# Patient Record
Sex: Male | Born: 1940 | Race: White | Hispanic: No | Marital: Married | Smoking: Former smoker
Health system: Southern US, Community
[De-identification: ages and names within clinical notes are randomized; demographics above are authoritative.]

## PROBLEM LIST (undated history)

## (undated) DIAGNOSIS — I447 Left bundle-branch block, unspecified: Secondary | ICD-10-CM

## (undated) DIAGNOSIS — R0602 Shortness of breath: Secondary | ICD-10-CM

## (undated) DIAGNOSIS — I428 Other cardiomyopathies: Secondary | ICD-10-CM

## (undated) DIAGNOSIS — E119 Type 2 diabetes mellitus without complications: Secondary | ICD-10-CM

## (undated) DIAGNOSIS — E785 Hyperlipidemia, unspecified: Secondary | ICD-10-CM

## (undated) DIAGNOSIS — I10 Essential (primary) hypertension: Secondary | ICD-10-CM

## (undated) DIAGNOSIS — I509 Heart failure, unspecified: Secondary | ICD-10-CM

## (undated) DIAGNOSIS — K2270 Barrett's esophagus without dysplasia: Secondary | ICD-10-CM

## (undated) DIAGNOSIS — M1990 Unspecified osteoarthritis, unspecified site: Secondary | ICD-10-CM

## (undated) DIAGNOSIS — D519 Vitamin B12 deficiency anemia, unspecified: Secondary | ICD-10-CM

## (undated) DIAGNOSIS — D509 Iron deficiency anemia, unspecified: Secondary | ICD-10-CM

## (undated) DIAGNOSIS — J8410 Pulmonary fibrosis, unspecified: Secondary | ICD-10-CM

## (undated) DIAGNOSIS — Z95810 Presence of automatic (implantable) cardiac defibrillator: Secondary | ICD-10-CM

## (undated) DIAGNOSIS — H269 Unspecified cataract: Secondary | ICD-10-CM

## (undated) DIAGNOSIS — I519 Heart disease, unspecified: Secondary | ICD-10-CM

## (undated) DIAGNOSIS — K750 Abscess of liver: Secondary | ICD-10-CM

## (undated) DIAGNOSIS — K635 Polyp of colon: Secondary | ICD-10-CM

## (undated) DIAGNOSIS — R768 Other specified abnormal immunological findings in serum: Principal | ICD-10-CM

## (undated) DIAGNOSIS — K219 Gastro-esophageal reflux disease without esophagitis: Secondary | ICD-10-CM

## (undated) DIAGNOSIS — T7840XA Allergy, unspecified, initial encounter: Secondary | ICD-10-CM

## (undated) DIAGNOSIS — R918 Other nonspecific abnormal finding of lung field: Secondary | ICD-10-CM

## (undated) HISTORY — DX: Barrett's esophagus without dysplasia: K22.70

## (undated) HISTORY — DX: Other specified abnormal immunological findings in serum: R76.8

## (undated) HISTORY — DX: Polyp of colon: K63.5

## (undated) HISTORY — PX: OTHER SURGICAL HISTORY: SHX169

## (undated) HISTORY — DX: Pulmonary fibrosis, unspecified: J84.10

## (undated) HISTORY — PX: COLONOSCOPY: SHX174

## (undated) HISTORY — DX: Allergy, unspecified, initial encounter: T78.40XA

## (undated) HISTORY — DX: Hyperlipidemia, unspecified: E78.5

## (undated) HISTORY — PX: CATARACT EXTRACTION: SUR2

## (undated) HISTORY — DX: Gastro-esophageal reflux disease without esophagitis: K21.9

## (undated) HISTORY — DX: Other nonspecific abnormal finding of lung field: R91.8

## (undated) HISTORY — DX: Unspecified osteoarthritis, unspecified site: M19.90

## (undated) HISTORY — DX: Heart disease, unspecified: I51.9

## (undated) HISTORY — DX: Unspecified cataract: H26.9

## (undated) HISTORY — PX: UPPER GI ENDOSCOPY: SHX6162

## (undated) HISTORY — DX: Essential (primary) hypertension: I10

## (undated) HISTORY — DX: Iron deficiency anemia, unspecified: D50.9

---
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
## 1978-12-23 DIAGNOSIS — K750 Abscess of liver: Secondary | ICD-10-CM

## 1978-12-23 HISTORY — DX: Abscess of liver: K75.0

## 1998-01-18 ENCOUNTER — Other Ambulatory Visit: Admission: RE | Admit: 1998-01-18 | Discharge: 1998-01-18 | Payer: Self-pay | Admitting: Gastroenterology

## 1999-04-04 ENCOUNTER — Other Ambulatory Visit: Admission: RE | Admit: 1999-04-04 | Discharge: 1999-04-04 | Payer: Self-pay | Admitting: Gastroenterology

## 1999-12-07 ENCOUNTER — Other Ambulatory Visit: Admission: RE | Admit: 1999-12-07 | Discharge: 1999-12-07 | Payer: Self-pay | Admitting: Gastroenterology

## 2000-02-01 ENCOUNTER — Encounter: Admission: RE | Admit: 2000-02-01 | Discharge: 2000-05-01 | Payer: Self-pay | Admitting: Internal Medicine

## 2008-10-21 LAB — HM COLONOSCOPY: HM Colonoscopy: NORMAL

## 2010-11-23 ENCOUNTER — Encounter: Payer: Self-pay | Admitting: Internal Medicine

## 2010-11-23 ENCOUNTER — Other Ambulatory Visit (INDEPENDENT_AMBULATORY_CARE_PROVIDER_SITE_OTHER): Payer: Medicare Other

## 2010-11-23 ENCOUNTER — Ambulatory Visit (INDEPENDENT_AMBULATORY_CARE_PROVIDER_SITE_OTHER): Payer: Medicare Other | Admitting: Internal Medicine

## 2010-11-23 VITALS — BP 122/82 | HR 85 | Temp 98.4°F | Ht 68.0 in | Wt 194.5 lb

## 2010-11-23 DIAGNOSIS — K2270 Barrett's esophagus without dysplasia: Secondary | ICD-10-CM | POA: Insufficient documentation

## 2010-11-23 DIAGNOSIS — D126 Benign neoplasm of colon, unspecified: Secondary | ICD-10-CM

## 2010-11-23 DIAGNOSIS — Z0000 Encounter for general adult medical examination without abnormal findings: Secondary | ICD-10-CM

## 2010-11-23 DIAGNOSIS — K635 Polyp of colon: Secondary | ICD-10-CM | POA: Insufficient documentation

## 2010-11-23 DIAGNOSIS — M1990 Unspecified osteoarthritis, unspecified site: Secondary | ICD-10-CM | POA: Insufficient documentation

## 2010-11-23 DIAGNOSIS — Z125 Encounter for screening for malignant neoplasm of prostate: Secondary | ICD-10-CM

## 2010-11-23 DIAGNOSIS — Z23 Encounter for immunization: Secondary | ICD-10-CM

## 2010-11-23 DIAGNOSIS — E119 Type 2 diabetes mellitus without complications: Secondary | ICD-10-CM | POA: Insufficient documentation

## 2010-11-23 DIAGNOSIS — K219 Gastro-esophageal reflux disease without esophagitis: Secondary | ICD-10-CM | POA: Insufficient documentation

## 2010-11-23 DIAGNOSIS — T7840XA Allergy, unspecified, initial encounter: Secondary | ICD-10-CM | POA: Insufficient documentation

## 2010-11-23 DIAGNOSIS — I119 Hypertensive heart disease without heart failure: Secondary | ICD-10-CM | POA: Insufficient documentation

## 2010-11-23 HISTORY — DX: Barrett's esophagus without dysplasia: K22.70

## 2010-11-23 HISTORY — DX: Polyp of colon: K63.5

## 2010-11-23 LAB — BASIC METABOLIC PANEL
BUN: 21 mg/dL (ref 6–23)
Chloride: 99 mEq/L (ref 96–112)
Glucose, Bld: 123 mg/dL — ABNORMAL HIGH (ref 70–99)
Potassium: 4.5 mEq/L (ref 3.5–5.1)
Sodium: 137 mEq/L (ref 135–145)

## 2010-11-23 LAB — URINALYSIS, ROUTINE W REFLEX MICROSCOPIC
Bilirubin Urine: NEGATIVE
Hgb urine dipstick: NEGATIVE
Leukocytes, UA: NEGATIVE
Nitrite: NEGATIVE
Total Protein, Urine: NEGATIVE
pH: 7 (ref 5.0–8.0)

## 2010-11-23 LAB — CBC WITH DIFFERENTIAL/PLATELET
Basophils Absolute: 0 10*3/uL (ref 0.0–0.1)
Eosinophils Relative: 1.3 % (ref 0.0–5.0)
Monocytes Relative: 8.1 % (ref 3.0–12.0)
Neutrophils Relative %: 65.9 % (ref 43.0–77.0)
Platelets: 281 10*3/uL (ref 150.0–400.0)
RDW: 15.2 % — ABNORMAL HIGH (ref 11.5–14.6)
WBC: 10.4 10*3/uL (ref 4.5–10.5)

## 2010-11-23 LAB — HEPATIC FUNCTION PANEL
ALT: 25 U/L (ref 0–53)
AST: 27 U/L (ref 0–37)
Albumin: 4.6 g/dL (ref 3.5–5.2)
Alkaline Phosphatase: 38 U/L — ABNORMAL LOW (ref 39–117)

## 2010-11-23 LAB — TSH: TSH: 3.38 u[IU]/mL (ref 0.35–5.50)

## 2010-11-23 LAB — LIPID PANEL: Cholesterol: 136 mg/dL (ref 0–200)

## 2010-11-23 LAB — HEMOGLOBIN A1C: Hgb A1c MFr Bld: 9.2 % — ABNORMAL HIGH (ref 4.6–6.5)

## 2010-11-23 MED ORDER — TETANUS-DIPHTH-ACELL PERTUSSIS 5-2.5-18.5 LF-MCG/0.5 IM SUSP
0.5000 mL | Freq: Once | INTRAMUSCULAR | Status: AC
  Administered 2010-11-23: 0.5 mL via INTRAMUSCULAR

## 2010-11-23 NOTE — Assessment & Plan Note (Signed)
Overall doing well, age appropriate education and counseling updated, referrals for preventative services and immunizations addressed, dietary and smoking counseling addressed, most recent labs and ECG reviewed.  I have personally reviewed and have noted: 1) the patient's medical and social history 2) The pt's use of alcohol, tobacco, and illicit drugs 3) The patient's current medications and supplements 4) Functional ability including ADL's, fall risk, home safety risk, hearing and visual impairment 5) Diet and physical activities 6) Evidence for depression or mood disorder 7) The patient's height, weight, and BMI have been recorded in the chart I have made referrals, and provided counseling and education based on review of the above For tetanus today

## 2010-11-23 NOTE — Assessment & Plan Note (Signed)
Last colonoscopy 2010, with next due [facility] at ? 10 yrs (pt prefers 5 yrs)

## 2010-11-23 NOTE — Assessment & Plan Note (Signed)
stable overall by hx and exam, most recent data reviewed with pt, and pt to continue medical treatment as before  No results found for this basename: HGBA1C   For a1c today

## 2010-11-23 NOTE — Patient Instructions (Addendum)
Please ask as front desk for Release of Information form to get information from the [place] You had the tetanus shot today Please go to LAB in the Basement for the blood and/or urine tests to be done today Please call the phone number [phone] (the PhoneTree System) for results of testing in 2-3 days;  When calling, simply dial the number, and when prompted enter the MRN number above (the Medical Record Number) and the # key, then the message should start. Please return in 6 mo with Lab testing done 3-5 days before

## 2010-11-24 ENCOUNTER — Other Ambulatory Visit: Payer: Self-pay

## 2010-11-24 MED ORDER — SITAGLIPTIN PHOSPHATE 100 MG PO TABS: 100.0000 mg | ORAL_TABLET | Freq: Every day | ORAL | Status: DC

## 2010-11-26 ENCOUNTER — Encounter: Payer: Self-pay | Admitting: Internal Medicine

## 2010-11-26 NOTE — Progress Notes (Signed)
Subjective:    Patient ID: Terry Peck, male    DOB: 12/07/1940, 70 y.o.   MRN: [id number]  HPI  Here as new pt;  Here for wellness and f/u;  Overall doing ok;  Pt denies CP, worsening SOB, DOE, wheezing, orthopnea, PND, worsening LE edema, palpitations, dizziness or syncope.  Pt denies neurological change such as new Headache, facial or extremity weakness.  Pt denies polydipsia, polyuria, or low sugar symptoms. Pt states overall good compliance with treatment and medications, good tolerability, and trying to follow lower cholesterol diet.  Pt denies worsening depressive symptoms, suicidal ideation or panic. No fever, wt loss, night sweats, loss of appetite, or other constitutional symptoms.  Pt states good ability with ADL's, low fall risk, home safety reviewed and adequate, no significant changes in hearing or vision, and occasionally active with exercise.   Past Medical History  Diagnosis Date  . Allergy   . Arthritis   . Diabetes mellitus   . GERD (gastroesophageal reflux disease)   . Hypertension   . Colon polyps 11/23/2010  . Barrett's esophagus 11/23/2010   Past Surgical History  Procedure Date  . Tonsillectomy 1952    reports that he has quit smoking. He does not have any smokeless tobacco history on file. He reports that he does not drink alcohol or use illicit drugs. family history includes Heart disease in his other. Allergies  Allergen Reactions  . Penicillins    No current outpatient prescriptions on file prior to visit.   Review of Systems Review of Systems  Constitutional: Negative for diaphoresis, activity change, appetite change and unexpected weight change.  HENT: Negative for hearing loss, ear pain, facial swelling, mouth sores and neck stiffness.   Eyes: Negative for pain, redness and visual disturbance.  Respiratory: Negative for shortness of breath and wheezing.   Cardiovascular: Negative for chest pain and palpitations.  Gastrointestinal: Negative for  diarrhea, blood in stool, abdominal distention and rectal pain.  Genitourinary: Negative for hematuria, flank pain and decreased urine volume.  Musculoskeletal: Negative for myalgias and joint swelling.  Skin: Negative for color change and wound.  Neurological: Negative for syncope and numbness.  Hematological: Negative for adenopathy.  Psychiatric/Behavioral: Negative for hallucinations, self-injury, decreased concentration and agitation.      Objective:   Physical Exam BP 122/82  Pulse 85  Temp(Src) 98.4 F (36.9 C) (Oral)  Ht 5\' 8"  (1.727 m)  Wt 194 lb 8 oz (88.225 kg)  BMI 29.57 kg/m2  SpO2 97% Physical Exam  VS noted Constitutional: Pt is oriented to person, place, and time. Appears well-developed and well-nourished.  HENT:  Head: Normocephalic and atraumatic.  Right Ear: External ear normal.  Left Ear: External ear normal.  Nose: Nose normal.  Mouth/Throat: Oropharynx is clear and moist.  Eyes: Conjunctivae and EOM are normal. Pupils are equal, round, and reactive to light.  Neck: Normal range of motion. Neck supple. No JVD present. No tracheal deviation present.  Cardiovascular: Normal rate, regular rhythm, normal heart sounds and intact distal pulses.   Pulmonary/Chest: Effort normal and breath sounds normal.  Abdominal: Soft. Bowel sounds are normal. There is no tenderness.  Musculoskeletal: Normal range of motion. Exhibits no edema.  Lymphadenopathy:  Has no cervical adenopathy.  Neurological: Pt is alert and oriented to person, place, and time. Pt has normal reflexes. No cranial nerve deficit.  Skin: Skin is warm and dry. No rash noted.  Psychiatric:  Has  normal mood and affect. Behavior is normal.  Assessment & Plan:

## 2010-12-13 ENCOUNTER — Other Ambulatory Visit: Payer: Self-pay

## 2010-12-13 MED ORDER — PIOGLITAZONE HCL 45 MG PO TABS: 45.0000 mg | ORAL_TABLET | Freq: Every day | ORAL | Status: DC

## 2011-03-30 ENCOUNTER — Ambulatory Visit: Payer: Medicare Other

## 2011-04-10 ENCOUNTER — Telehealth: Payer: Self-pay

## 2011-04-10 MED ORDER — OMEPRAZOLE MAGNESIUM 20 MG PO TBEC: 20.0000 mg | DELAYED_RELEASE_TABLET | Freq: Two times a day (BID) | ORAL | Status: DC

## 2011-04-10 MED ORDER — SIMVASTATIN 80 MG PO TABS: 80.0000 mg | ORAL_TABLET | Freq: Every day | ORAL | Status: DC

## 2011-04-10 NOTE — Telephone Encounter (Signed)
Patient called requesting refill on Simvastatin  And Omeprazole sent to pharmacy. Called patient confirmed prescriptions had been sent in as requested.

## 2011-04-11 ENCOUNTER — Telehealth: Payer: Self-pay | Admitting: *Deleted

## 2011-04-11 NOTE — Telephone Encounter (Signed)
[facility] called regarding Omeprazole rx sent yesterday. Rx was for tablets but they usually dispense capsules-Pharmacy wants to know if it is okay to dispense capsules.

## 2011-04-11 NOTE — Telephone Encounter (Signed)
Morrie Sheldon at pharmacy advised

## 2011-04-11 NOTE — Telephone Encounter (Signed)
Ok for capsules 

## 2011-05-03 ENCOUNTER — Telehealth: Payer: Self-pay

## 2011-05-03 NOTE — Telephone Encounter (Signed)
Flu Vaccine Given 05/03/2011 [facility] [address] [place]

## 2011-05-07 ENCOUNTER — Telehealth: Payer: Self-pay | Admitting: Internal Medicine

## 2011-05-07 MED ORDER — HYDROCHLOROTHIAZIDE 25 MG PO TABS: 25.0000 mg | ORAL_TABLET | Freq: Every day | ORAL | Status: DC

## 2011-05-07 MED ORDER — VERAPAMIL HCL 180 MG PO TBCR: 180.0000 mg | EXTENDED_RELEASE_TABLET | Freq: Every day | ORAL | Status: DC

## 2011-05-07 NOTE — Telephone Encounter (Signed)
Pt is requesting hydrochlorothyrozide 25mg  tablets--90 dy supply--verapmil 180mg --90 day supply--[facility] [address]---Pt ph# [phone]---til 11A--after 11a [phone]

## 2011-05-29 ENCOUNTER — Encounter: Payer: Self-pay | Admitting: Internal Medicine

## 2011-05-29 ENCOUNTER — Ambulatory Visit (INDEPENDENT_AMBULATORY_CARE_PROVIDER_SITE_OTHER)
Admission: RE | Admit: 2011-05-29 | Discharge: 2011-05-29 | Disposition: A | Discharge status: Home / Self Care | Payer: Medicare Other | Source: Ambulatory Visit | Attending: Internal Medicine | Admitting: Internal Medicine

## 2011-05-29 ENCOUNTER — Telehealth: Payer: Self-pay | Admitting: Internal Medicine

## 2011-05-29 ENCOUNTER — Ambulatory Visit (INDEPENDENT_AMBULATORY_CARE_PROVIDER_SITE_OTHER): Payer: Medicare Other | Admitting: Internal Medicine

## 2011-05-29 ENCOUNTER — Other Ambulatory Visit (INDEPENDENT_AMBULATORY_CARE_PROVIDER_SITE_OTHER): Payer: Medicare Other

## 2011-05-29 VITALS — BP 132/72 | HR 87 | Temp 97.7°F | Ht 68.0 in | Wt 189.1 lb

## 2011-05-29 DIAGNOSIS — D509 Iron deficiency anemia, unspecified: Secondary | ICD-10-CM

## 2011-05-29 DIAGNOSIS — R0609 Other forms of dyspnea: Secondary | ICD-10-CM

## 2011-05-29 DIAGNOSIS — R0600 Dyspnea, unspecified: Secondary | ICD-10-CM | POA: Insufficient documentation

## 2011-05-29 DIAGNOSIS — R9389 Abnormal findings on diagnostic imaging of other specified body structures: Secondary | ICD-10-CM

## 2011-05-29 DIAGNOSIS — I10 Essential (primary) hypertension: Secondary | ICD-10-CM

## 2011-05-29 DIAGNOSIS — J8410 Pulmonary fibrosis, unspecified: Secondary | ICD-10-CM

## 2011-05-29 DIAGNOSIS — R911 Solitary pulmonary nodule: Secondary | ICD-10-CM

## 2011-05-29 DIAGNOSIS — R0989 Other specified symptoms and signs involving the circulatory and respiratory systems: Secondary | ICD-10-CM

## 2011-05-29 DIAGNOSIS — IMO0001 Reserved for inherently not codable concepts without codable children: Secondary | ICD-10-CM

## 2011-05-29 DIAGNOSIS — M25511 Pain in right shoulder: Secondary | ICD-10-CM | POA: Insufficient documentation

## 2011-05-29 DIAGNOSIS — R9431 Abnormal electrocardiogram [ECG] [EKG]: Secondary | ICD-10-CM | POA: Insufficient documentation

## 2011-05-29 LAB — CBC WITH DIFFERENTIAL/PLATELET
Basophils Absolute: 0 10*3/uL (ref 0.0–0.1)
Eosinophils Absolute: 0.5 10*3/uL (ref 0.0–0.7)
MCHC: 32.1 g/dL (ref 30.0–36.0)
MCV: 75.1 fl — ABNORMAL LOW (ref 78.0–100.0)
Monocytes Absolute: 0.6 10*3/uL (ref 0.1–1.0)
Neutrophils Relative %: 56.9 % (ref 43.0–77.0)
Platelets: 282 10*3/uL (ref 150.0–400.0)
WBC: 6.9 10*3/uL (ref 4.5–10.5)

## 2011-05-29 LAB — BASIC METABOLIC PANEL
BUN: 19 mg/dL (ref 6–23)
Chloride: 101 mEq/L (ref 96–112)
Potassium: 4.4 mEq/L (ref 3.5–5.1)

## 2011-05-29 LAB — HEPATIC FUNCTION PANEL
ALT: 27 U/L (ref 0–53)
AST: 28 U/L (ref 0–37)
Bilirubin, Direct: 0 mg/dL (ref 0.0–0.3)
Total Bilirubin: 0.7 mg/dL (ref 0.3–1.2)

## 2011-05-29 LAB — LIPID PANEL
Cholesterol: 124 mg/dL (ref 0–200)
LDL Cholesterol: 46 mg/dL (ref 0–99)

## 2011-05-29 LAB — BRAIN NATRIURETIC PEPTIDE: Pro B Natriuretic peptide (BNP): 82 pg/mL (ref 0.0–100.0)

## 2011-05-29 LAB — HEMOGLOBIN A1C: Hgb A1c MFr Bld: 8.2 % — ABNORMAL HIGH (ref 4.6–6.5)

## 2011-05-29 MED ORDER — GLYBURIDE 5 MG PO TABS: 10.0000 mg | ORAL_TABLET | Freq: Two times a day (BID) | ORAL | Status: DC

## 2011-05-29 MED ORDER — METHOCARBAMOL 750 MG PO TABS: 750.0000 mg | ORAL_TABLET | ORAL | Status: DC | PRN

## 2011-05-29 NOTE — Assessment & Plan Note (Signed)
stable overall by hx and exam, most recent data reviewed with pt, and pt to continue medical treatment as before  Lab Results  Component Value Date   HGBA1C 9.2* 11/23/2010   Seems improved on januvia, cont all same meds for now, check labs

## 2011-05-29 NOTE — Patient Instructions (Addendum)
Your EKG was abnormal today -  Please go to XRAY in the Basement for the x-ray test Please go to LAB in the Basement for the blood and/or urine tests to be done today Please call the phone number [phone] (the PhoneTree System) for results of testing in 2-3 days;  When calling, simply dial the number, and when prompted enter the MRN number above (the Medical Record Number) and the # key, then the message should start. You will be contacted regarding the referral for: Lung testing (PFT's), and Echocardiogram (heart ultrasound), and cardiology Please return in 3 wks, or sooner if needed

## 2011-05-29 NOTE — Assessment & Plan Note (Signed)
With mild recurrent pain, doubt cardiac related, mild, consider ortho but it seem CV concerns more pressing for now

## 2011-05-29 NOTE — Assessment & Plan Note (Addendum)
With LBBB unclear how old, no comparison available; ok for referral to card, may need stress test as well

## 2011-05-29 NOTE — Telephone Encounter (Signed)
See lab note for discussion.

## 2011-05-29 NOTE — Assessment & Plan Note (Signed)
Has few bibas rales, diff includes chf new onset, pulm fibrosis, or other;  Today for ecg, cxr, cbc/labs, PFT, and echo;  Cont same meds but if seems volume overload by testing would consider change hctz to lasix, as well as d/c actos due to possible fluid retention

## 2011-05-29 NOTE — Progress Notes (Signed)
Subjective:    Patient ID: Terry Peck, male    DOB: [birth year], 71 y.o.   MRN: [id number]  HPI Here to f/u; overall doing ok,  Pt denies chest pain, , wheezing, orthopnea, PND, increased LE swelling, palpitations, dizziness or syncope.  Pt denies new neurological symptoms such as new headache, or facial or extremity weakness or numbness   Pt denies polydipsia, polyuria, or low sugar symptoms such as weakness or confusion improved with po intake.  Pt states overall good compliance with meds, trying to follow lower cholesterol, diabetic diet, wt overall stable but little exercise however.  CBG's usually in the low 80's.  Also to mention in late Sept 2012 he was painting the house, stepped off the ladder and fell somehow backwards to the ground, hit the right outstretched arm palm first, and even since then with right shoulder pain at the shoulder/bicep, right elbow or wrist, achy and occas sharp, usually mild, somitimes mod, sometime more dififcult to sleep and can only get about 5 hours, pain overall worse at night to use more during the day with throbbing at the uper arm ice pick like in the evening, but no specific movoement seems to make worse such as abduction vs forward elevation vs other.  Also mentions worsening sob/doe (my "staying power") gradually worse over 6 months without pain, could walk about 1/2 mile easily at faster clip then, only 1/4 mile at this time, could not keep up with his wife walking on the beach in [place].  3 flights of stairs can be done, but very uncomfortable compared to before.   Pt denies fever, wt loss, night sweats, loss of appetite, or other constitutional symptoms, except thinks he lost 5 lbs intentionally in the past few months with diet.  Denies worsening depressive symptoms, suicidal ideation.  Thinks last ECG and stress test prob 15 yrs ago. Past Medical History  Diagnosis Date  . Allergy   . Arthritis   . Diabetes mellitus   . GERD (gastroesophageal reflux  disease)   . Hypertension   . Colon polyps 11/23/2010  . Barrett's esophagus 11/23/2010   Past Surgical History  Procedure Date  . Tonsillectomy 1952    reports that he has quit smoking. He does not have any smokeless tobacco history on file. He reports that he does not drink alcohol or use illicit drugs. family history includes Heart disease in his other. Allergies  Allergen Reactions  . Penicillins    Current Outpatient Prescriptions on File Prior to Visit  Medication Sig Dispense Refill  . Calcium-Magnesium 300-300 MG TABS Take by mouth daily.        . Calcium-Magnesium-Zinc 1000-400-15 MG TABS Take by mouth daily.        . Cholecalciferol (VITAMIN D) 1000 UNITS capsule Take 1,000 Units by mouth daily.        Marland Kitchen glyBURIDE (DIABETA) 5 MG tablet 2 by mouth in the morning and 2 by mouth in the evening       . hydrochlorothiazide (HYDRODIURIL) 25 MG tablet Take 1 tablet (25 mg total) by mouth daily.  90 tablet  2  . ibuprofen (ADVIL,MOTRIN) 600 MG tablet Take 600 mg by mouth as needed.        . Lutein 20 MG CAPS Take by mouth daily.        . metFORMIN (GLUCOPHAGE) 850 MG tablet Take 850 mg by mouth 3 (three) times daily.        . methocarbamol (ROBAXIN) 750 MG tablet Take  750 mg by mouth as needed.        . Multiple Vitamin (MULTIVITAMIN) tablet Take 1 tablet by mouth daily.        . OMEGA 3 1000 MG CAPS Take by mouth.        Marland Kitchen omeprazole (PRILOSEC OTC) 20 MG tablet Take 1 tablet (20 mg total) by mouth 2 (two) times daily.  180 tablet  3  . pioglitazone (ACTOS) 45 MG tablet Take 1 tablet (45 mg total) by mouth at bedtime.  90 tablet  3  . simvastatin (ZOCOR) 80 MG tablet Take 1 tablet (80 mg total) by mouth at bedtime.  90 tablet  3  . sitaGLIPtin (JANUVIA) 100 MG tablet Take 1 tablet (100 mg total) by mouth daily.  90 tablet  3  . verapamil (CALAN-SR) 180 MG CR tablet Take 1 tablet (180 mg total) by mouth at bedtime.  90 tablet  2   Review of Systems Review of Systems  Constitutional:  Negative for diaphoresis and unexpected weight change.  HENT: Negative for drooling and tinnitus.   Eyes: Negative for photophobia and visual disturbance.  Respiratory: Negative for choking and stridor.   Gastrointestinal: Negative for vomiting and blood in stool.  Genitourinary: Negative for hematuria and decreased urine volume.  Musculoskeletal: Negative for gait problem.  Skin: Negative for color change and wound.  Neurological: Negative for tremors and numbness.  Psychiatric/Behavioral: Negative for decreased concentration. The patient is not hyperactive.      Objective:   Physical Exam BP 132/72  Pulse 87  Temp(Src) 97.7 F (36.5 C) (Oral)  Ht 5\' 8"  (1.727 m)  Wt 189 lb 2 oz (85.787 kg)  BMI 28.76 kg/m2  SpO2 95% Physical Exam  VS noted Constitutional: Pt appears well-developed and well-nourished.  HENT: Head: Normocephalic.  Right Ear: External ear normal.  Left Ear: External ear normal.  Eyes: Conjunctivae and EOM are normal. Pupils are equal, round, and reactive to light.  Neck: Normal range of motion. Neck supple. No JVD Cardiovascular: Normal rate and regular rhythm.   Pulmonary/Chest: Effort normal and breath sounds with few bibas rales - ? dry  Abd:  Soft, NT, non-distended, + BS Neurological: Pt is alert. No cranial nerve deficit.  Skin: Skin is warm. No erythema. No LE edema Psychiatric: Pt behavior is normal. Thought content normal.     Assessment & Plan:

## 2011-05-30 ENCOUNTER — Encounter: Payer: Self-pay | Admitting: Internal Medicine

## 2011-05-30 DIAGNOSIS — E538 Deficiency of other specified B group vitamins: Secondary | ICD-10-CM | POA: Insufficient documentation

## 2011-05-30 DIAGNOSIS — D509 Iron deficiency anemia, unspecified: Secondary | ICD-10-CM

## 2011-05-30 HISTORY — DX: Iron deficiency anemia, unspecified: D50.9

## 2011-05-31 ENCOUNTER — Ambulatory Visit (INDEPENDENT_AMBULATORY_CARE_PROVIDER_SITE_OTHER)
Admission: RE | Admit: 2011-05-31 | Discharge: 2011-05-31 | Disposition: A | Discharge status: Home / Self Care | Payer: Medicare Other | Source: Ambulatory Visit | Attending: Internal Medicine | Admitting: Internal Medicine

## 2011-05-31 ENCOUNTER — Ambulatory Visit (HOSPITAL_COMMUNITY): Payer: Medicare Other | Attending: Cardiovascular Disease | Admitting: Radiology

## 2011-05-31 ENCOUNTER — Encounter: Payer: Self-pay | Admitting: Gastroenterology

## 2011-05-31 DIAGNOSIS — Z87891 Personal history of nicotine dependence: Secondary | ICD-10-CM | POA: Insufficient documentation

## 2011-05-31 DIAGNOSIS — R0989 Other specified symptoms and signs involving the circulatory and respiratory systems: Secondary | ICD-10-CM | POA: Insufficient documentation

## 2011-05-31 DIAGNOSIS — R0602 Shortness of breath: Secondary | ICD-10-CM

## 2011-05-31 DIAGNOSIS — I447 Left bundle-branch block, unspecified: Secondary | ICD-10-CM | POA: Insufficient documentation

## 2011-05-31 DIAGNOSIS — R0609 Other forms of dyspnea: Secondary | ICD-10-CM | POA: Insufficient documentation

## 2011-05-31 DIAGNOSIS — I10 Essential (primary) hypertension: Secondary | ICD-10-CM | POA: Insufficient documentation

## 2011-05-31 DIAGNOSIS — IMO0001 Reserved for inherently not codable concepts without codable children: Secondary | ICD-10-CM

## 2011-05-31 DIAGNOSIS — E119 Type 2 diabetes mellitus without complications: Secondary | ICD-10-CM | POA: Insufficient documentation

## 2011-05-31 DIAGNOSIS — R222 Localized swelling, mass and lump, trunk: Secondary | ICD-10-CM

## 2011-05-31 DIAGNOSIS — R9389 Abnormal findings on diagnostic imaging of other specified body structures: Secondary | ICD-10-CM

## 2011-05-31 DIAGNOSIS — R9431 Abnormal electrocardiogram [ECG] [EKG]: Secondary | ICD-10-CM | POA: Insufficient documentation

## 2011-05-31 DIAGNOSIS — R918 Other nonspecific abnormal finding of lung field: Secondary | ICD-10-CM

## 2011-06-01 ENCOUNTER — Telehealth: Payer: Self-pay

## 2011-06-01 NOTE — Telephone Encounter (Signed)
I spoke with TD and stated we can bring pt in to see MR on 06/13/11 at 9:15. This is the earliest we can get pt in. I called and spoke with Corrie Dandy and made her away. She states she will let Dr. Jonny Ruiz know. I advised her to have pt arrive 15 min early to fill out the paperwork. She voiced her understanding and needed nothing further

## 2011-06-03 NOTE — Assessment & Plan Note (Signed)
stable overall by hx and exam, most recent data reviewed with pt, and pt to continue medical treatment as before  BP Readings from Last 3 Encounters:  05/29/11 132/72  11/23/10 122/82

## 2011-06-06 ENCOUNTER — Ambulatory Visit: Payer: Medicare Other | Admitting: Gastroenterology

## 2011-06-06 ENCOUNTER — Ambulatory Visit (INDEPENDENT_AMBULATORY_CARE_PROVIDER_SITE_OTHER): Payer: Medicare Other | Admitting: Internal Medicine

## 2011-06-06 DIAGNOSIS — R0989 Other specified symptoms and signs involving the circulatory and respiratory systems: Secondary | ICD-10-CM

## 2011-06-06 LAB — PULMONARY FUNCTION TEST

## 2011-06-06 NOTE — Progress Notes (Signed)
PFT done today. 

## 2011-06-13 ENCOUNTER — Institutional Professional Consult (permissible substitution): Payer: Medicare Other | Admitting: Internal Medicine

## 2011-06-14 ENCOUNTER — Encounter: Payer: Self-pay | Admitting: Internal Medicine

## 2011-06-14 ENCOUNTER — Institutional Professional Consult (permissible substitution): Payer: Medicare Other | Admitting: Cardiovascular Disease

## 2011-06-18 ENCOUNTER — Encounter: Payer: Self-pay | Admitting: Gastroenterology

## 2011-06-18 ENCOUNTER — Ambulatory Visit (INDEPENDENT_AMBULATORY_CARE_PROVIDER_SITE_OTHER): Payer: Medicare Other | Admitting: Gastroenterology

## 2011-06-18 VITALS — BP 132/76 | HR 88 | Ht 69.0 in | Wt 195.0 lb

## 2011-06-18 DIAGNOSIS — D649 Anemia, unspecified: Secondary | ICD-10-CM

## 2011-06-18 DIAGNOSIS — K2270 Barrett's esophagus without dysplasia: Secondary | ICD-10-CM

## 2011-06-18 DIAGNOSIS — Z86010 Personal history of colonic polyps: Secondary | ICD-10-CM

## 2011-06-18 MED ORDER — PEG-KCL-NACL-NASULF-NA ASC-C 100 G PO SOLR: 1.0000 | ORAL | Status: DC

## 2011-06-18 NOTE — Progress Notes (Signed)
HPI: This is a   very pleasant 71 year old man whom I am meeting for the first time today  Recent blood work shows normal complete metabolic profile, hemoglobin 11.3 with MCV 75. His B12 level was slightly low, his iron was low at 28, his hemoglobin A1c was elevated at 8.2  He had a colonoscopy/EGD in [place]. He had colonscopy/EGD in private practice in [place], from [facility].  He believes he's had polyps removed from colon that   Overall weigh is up lately.   NO overt GI bleeding.  No dysphagia.  Takes PPI bid, and on that he has no pyrosis.  Reviewing records from Dr. Purcell Nails here show at least one colonoscopy in the 1990s where "multiple small polyps were biopsied to destruction, no tissue was sent to pathology" he was recommended to have repeat colonoscopy at the 4 year interval. He also had undergone several upper endoscopies over the past years and [facility] continually describes a 4-5 cm segment of Barrett's. This was confirmed on biopsy. At least one of those biopsy reports mentions low-grade dysplasia.   Review of systems: Pertinent positive and negative review of systems were noted in the above HPI section. Complete review of systems was performed and was otherwise normal.    Past Medical History  Diagnosis Date  . Allergy   . Arthritis   . Diabetes mellitus   . GERD (gastroesophageal reflux disease)   . Hypertension   . Colon polyps 11/23/2010  . Barrett's esophagus 11/23/2010  . Iron deficiency anemia 05/30/2011  . B12 deficiency 05/30/2011    Past Surgical History  Procedure Date  . Tonsillectomy 1952    Current Outpatient Prescriptions  Medication Sig Dispense Refill  . Calcium-Magnesium 300-300 MG TABS Take by mouth daily.        . Calcium-Magnesium-Zinc 1000-400-15 MG TABS Take by mouth daily.        . Cholecalciferol (VITAMIN D) 1000 UNITS capsule Take 1,000 Units by mouth daily.        . Cyanocobalamin (VITAMIN B-12 PO) Take by mouth.      . ferrous fumarate  (HEMOCYTE - 106 MG FE) 325 (106 FE) MG TABS Take 1 tablet by mouth daily.      Marland Kitchen glyBURIDE (DIABETA) 5 MG tablet Take 2 tablets (10 mg total) by mouth 2 (two) times daily with a meal. 2 by mouth in the morning and 2 by mouth in the evening  180 tablet  3  . hydrochlorothiazide (HYDRODIURIL) 25 MG tablet Take 1 tablet (25 mg total) by mouth daily.  90 tablet  2  . ibuprofen (ADVIL,MOTRIN) 600 MG tablet Take 600 mg by mouth as needed.        . Lutein 20 MG CAPS Take by mouth daily.        . metFORMIN (GLUCOPHAGE) 850 MG tablet Take 850 mg by mouth 3 (three) times daily.        . methocarbamol (ROBAXIN) 750 MG tablet Take 1 tablet (750 mg total) by mouth as needed.  60 tablet  2  . Multiple Vitamin (MULTIVITAMIN) tablet Take 1 tablet by mouth daily.        . OMEGA 3 1000 MG CAPS Take by mouth.        Marland Kitchen omeprazole (PRILOSEC OTC) 20 MG tablet Take 1 tablet (20 mg total) by mouth 2 (two) times daily.  180 tablet  3  . pioglitazone (ACTOS) 45 MG tablet Take 1 tablet (45 mg total) by mouth at bedtime.  90 tablet  3  . simvastatin (ZOCOR) 80 MG tablet Take 1 tablet (80 mg total) by mouth at bedtime.  90 tablet  3  . sitaGLIPtin (JANUVIA) 100 MG tablet Take 1 tablet (100 mg total) by mouth daily.  90 tablet  3  . verapamil (CALAN-SR) 180 MG CR tablet Take 1 tablet (180 mg total) by mouth at bedtime.  90 tablet  2    Allergies as of 06/18/2011 - Review Complete 06/18/2011  Allergen Reaction Noted  . Penicillins  11/23/2010    Family History  Problem Relation Age of Onset  . Heart disease Mother   . Colon cancer Neg Hx     History   Social History  . Marital Status: Married    Spouse Name: N/A    Number of Children: 2  . Years of Education: 16   Occupational History  . 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 Smoking status: Former Games developer  . Smokeless tobacco: Never Used  . Alcohol Use: No  . Drug Use: No  . Sexually  Active: Not on file   Other Topics Concern  . Not on file   Social History Narrative   Daily caffeine        Physical Exam: BP 132/76  Pulse 88  Ht 5\' 9"  (1.753 m)  Wt 195 lb (88.451 kg)  BMI 28.80 kg/m2 Constitutional: generally well-appearing Psychiatric: alert and oriented x3 Eyes: extraocular movements intact Mouth: oral pharynx moist, no lesions Neck: supple no lymphadenopathy Cardiovascular: heart regular rate and rhythm Lungs: clear to auscultation bilaterally Abdomen: soft, nontender, nondistended, no obvious ascites, no peritoneal signs, normal bowel sounds Extremities: no lower extremity edema bilaterally Skin: no lesions on visible extremities    Assessment and plan: 71 y.o. male with  personal history of Barrett's, personal history of polyps in colon  I do not have pathologic documentation that he has had precancerous polyps for move from his colon however he is certain that he has. He clearly has had Barrett's changes found in his esophagus over many years. He has no new alarm symptoms but I think surveillance colonoscopy as well as upper endoscopy is reasonable at this point given his new iron deficiency anemia.

## 2011-06-18 NOTE — Patient Instructions (Signed)
You will be set up for an upper endoscopy for Barrett's surveillance. You will be set up for a colonoscopy for personal history of polyps. We will try to get records from [facility] (from 2 colonoscopies and 2 EGDs).

## 2011-06-19 ENCOUNTER — Ambulatory Visit: Payer: Medicare Other | Admitting: Internal Medicine

## 2011-06-20 ENCOUNTER — Ambulatory Visit (INDEPENDENT_AMBULATORY_CARE_PROVIDER_SITE_OTHER): Payer: Medicare Other | Admitting: Internal Medicine

## 2011-06-20 ENCOUNTER — Other Ambulatory Visit (INDEPENDENT_AMBULATORY_CARE_PROVIDER_SITE_OTHER): Payer: Medicare Other

## 2011-06-20 ENCOUNTER — Encounter: Payer: Self-pay | Admitting: Internal Medicine

## 2011-06-20 ENCOUNTER — Other Ambulatory Visit: Payer: Self-pay | Admitting: Internal Medicine

## 2011-06-20 VITALS — BP 132/72 | HR 90 | Temp 98.5°F | Ht 69.0 in | Wt 189.5 lb

## 2011-06-20 VITALS — BP 140/90 | HR 91 | Temp 98.1°F | Ht 69.0 in | Wt 190.6 lb

## 2011-06-20 DIAGNOSIS — R0989 Other specified symptoms and signs involving the circulatory and respiratory systems: Secondary | ICD-10-CM

## 2011-06-20 DIAGNOSIS — J849 Interstitial pulmonary disease, unspecified: Secondary | ICD-10-CM

## 2011-06-20 DIAGNOSIS — J8410 Pulmonary fibrosis, unspecified: Secondary | ICD-10-CM

## 2011-06-20 DIAGNOSIS — R9389 Abnormal findings on diagnostic imaging of other specified body structures: Secondary | ICD-10-CM

## 2011-06-20 DIAGNOSIS — IMO0001 Reserved for inherently not codable concepts without codable children: Secondary | ICD-10-CM

## 2011-06-20 DIAGNOSIS — R931 Abnormal findings on diagnostic imaging of heart and coronary circulation: Secondary | ICD-10-CM | POA: Insufficient documentation

## 2011-06-20 DIAGNOSIS — I10 Essential (primary) hypertension: Secondary | ICD-10-CM

## 2011-06-20 DIAGNOSIS — R911 Solitary pulmonary nodule: Secondary | ICD-10-CM

## 2011-06-20 LAB — CARDIAC PANEL: Total CK: 171 U/L (ref 7–232)

## 2011-06-20 MED ORDER — INSULIN GLARGINE 100 UNIT/ML ~~LOC~~ SOLN: 20.0000 [IU] | Freq: Every day | SUBCUTANEOUS | Status: DC

## 2011-06-20 MED ORDER — INSULIN PEN NEEDLE 31G X 5 MM MISC: 1.0000 "application " | Freq: Every day | Status: DC

## 2011-06-20 NOTE — Patient Instructions (Signed)
Ok to stop the glyburide Ok to stop the [place] You had 5 units levemir insulin today Start the lantus tomorrow at 15 units, and if your sugars are obviously not lower than 150 or so in the next 2-3 days, ok to increase to 20 units Check your blood sugars before each meal and in evening for now; please call [phone] in 1 wk with results as you will likley need to increase the lantus further Continue all other medications as before Please return in 2 weeks Please keep your appointments with your specialists as you have planned - pulmonary, cardiology

## 2011-06-20 NOTE — Progress Notes (Signed)
Subjective:    Patient ID: Terry Peck, male    DOB: 04-15-1941, 71 y.o.   MRN: [id number]  HPI IOV 06/20/2011 . PMD is Dr Oliver Barre. REferred for lung nodule and dyspnea  71year old male.  reports that he quit smoking about 23 years ago. His smoking use included Cigarettes. He has a 5 pack-year smoking history (1/2 pack per day x 10 years). He has never used smokeless tobacco. Body mass index is 28.15 kg/(m^2). Known DM with nocturia - started on insulin 06/20/2011 , BP, Arthritis, Barretts - seeing Dr Christella Hartigan  Repoirts hx of small lung nodule with serial CT fu at [facility] for 2 years at [facility] ending 5 years ago. STable and dc from followup. Does not remeemer which side  Reports insidious onset of dyspnea on exertion x 1  Year. Progressive gradually. Improved by rest. Notices when he mows yard, walking briskly one flight of stairs notices dyspnea.Always improved by rest. Wife notices he is not able to keep up with her during walks outside the home. No associated weight gain, or admissions to hospitals, no new medical problems diagnosed since onset of symptoms. Denies associated chest pain, cough, fever, weight gain, hemoptysis, edema, orthopnea, paroxysmal nocturnal dyspnea, wheezing. Systolic function was moderately to severely reduced.   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 Has active GERD +. Denies collagen vascular disease. No steel dust, metal dust or asbestos exposure. Denies amio intake. Denies chemo. Does not have pet birds or feathery pillows  ECHO - 05/31/11: ejection fraction was in the range of 30% to 35%. There is hypokinesis of the inferior and inferoseptal myocardium. Doppler parameters are consistent with abnormal left ventricular relaxation (grade 1 diastolic Dysfunction). - Ventricular septum: These changes are consistent with a left bundle branch block. - Mitral valve: Mildly calcified annulus. Mildly  thickenedleaflets . - Pulmonary arteries: Systolic pressure was mildly increased. PA peak pressure: 37mm Hg (S).  Ct Chest Wo Contrast  05/31/2011  *RT CHEST WITHOUT CONTRAST  Technique:   Findings:  No enlarged axillary or supraclavicular lymph nodes.  There is no enlarged mediastinal or hilar lymph nodes.  No pericardial or pleural effusion identified.  There is a patulous and fluid-filled thoracic esophagus.  There is peripheral and basilar predominant interstitial reticulation.  Bibasilar subpleural honeycombing is also identified.  Mild bronchiectasis is present in the lung bases.  Ground-glass attenuation nodular density is noted within the left upper lobe measuring 1.8 cm, image 29.  Solid nodule within left lower lobe measures 6 mm.  Review of the visualized osseous structures is unremarkable.  Limited imaging through the upper abdomen is significant for a left renal cyst.  Only partially visualized and incompletely characterized without IV contrast.  IMPRESSION:  1.  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 2.  Ground-glass attenuating nodule within the left upper lobe is nonspecific and may be related to interstitial lung disease.  Low grade pulmonary adenocarcinoma may have a similar appearance. Follow-up imaging in 3 months is recommended to ensure stability.    PFT 06/06/11:Signs of restrictions c/w ILD on CT present even though TLC 5L/81%  is normal. This is because FVC 2.9L/69% and reduced (fev1 is 2.45L/86% and normal). ratip is 84 and normal. DLCO is reduced 11/50%        Past Medical History  Diagnosis Date  . Allergy   . Arthritis   . Diabetes mellitus   .  GERD (gastroesophageal reflux disease)   . Hypertension   . Colon polyps 11/23/2010  . Barrett's esophagus 11/23/2010  . Iron deficiency anemia 05/30/2011  . B12 deficiency 05/30/2011     Family History  Problem Relation Age of Onset  . Heart disease Mother   . Colon cancer Neg Hx       History   Social History  . Marital Status: Married    Spouse Name: N/A    Number of Children: 2  . Years of Education: 16   Occupational History  . 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 Smoking status: Former Smoker -- 0.5 packs/day for 10 years    Types: Cigarettes    Quit date: 04/23/1988  . Smokeless tobacco: Never Used  . Alcohol Use: No  . Drug Use: No  . Sexually Active: Not on file   Other Topics Concern  . Not on file   Social History Narrative   Daily caffeine      Allergies  Allergen Reactions  . Penicillins      Outpatient Prescriptions Prior to Visit  Medication Sig Dispense Refill  . Calcium-Magnesium 300-300 MG TABS Take by mouth daily.        . Calcium-Magnesium-Zinc 1000-400-15 MG TABS Take by mouth daily.        . Cholecalciferol (VITAMIN D) 1000 UNITS capsule Take 1,000 Units by mouth daily.        . Cyanocobalamin (VITAMIN B-12 PO) Take by mouth.      . ferrous fumarate (HEMOCYTE - 106 MG FE) 325 (106 FE) MG TABS Take 1 tablet by mouth daily.      . hydrochlorothiazide (HYDRODIURIL) 25 MG tablet Take 1 tablet (25 mg total) by mouth daily.  90 tablet  2  . ibuprofen (ADVIL,MOTRIN) 600 MG tablet Take 600 mg by mouth as needed.        . insulin glargine (LANTUS SOLOSTAR) 100 UNIT/ML injection Inject 20 Units into the skin daily.  5 pen  PRN  . Insulin Pen Needle 31G X 5 MM MISC 1 application by Does not apply route daily.  100 each  5  . Lutein 20 MG CAPS Take by mouth daily.        . metFORMIN (GLUCOPHAGE) 850 MG tablet Take 850 mg by mouth 3 (three) times daily.        . methocarbamol (ROBAXIN) 750 MG tablet Take 1 tablet (750 mg total) by mouth as needed.  60 tablet  2  . Multiple Vitamin (MULTIVITAMIN) tablet Take 1 tablet by mouth daily.        . OMEGA 3 1000 MG CAPS Take by mouth.        Marland Kitchen omeprazole (PRILOSEC OTC) 20 MG tablet Take 1 tablet (20 mg total) by mouth 2 (two)  times daily.  180 tablet  3  . peg 3350 powder (MOVIPREP) 100 G SOLR Take 1 kit (100 g total) by mouth as directed. See written handout  1 kit  0  . pioglitazone (ACTOS) 45 MG tablet Take 1 tablet (45 mg total) by mouth at bedtime.  90 tablet  3  . simvastatin (ZOCOR) 80 MG tablet Take 1 tablet (80 mg total) by mouth at bedtime.  90 tablet  3  . verapamil (CALAN-SR) 180 MG CR tablet Take 1 tablet (180 mg total) by mouth at bedtime.  90 tablet  2        Review  of Systems  Constitutional: Negative for fever and unexpected weight change.  HENT: Negative for ear pain, nosebleeds, congestion, sore throat, rhinorrhea, sneezing, trouble swallowing, dental problem, postnasal drip and sinus pressure.   Eyes: Negative for redness and itching.  Respiratory: Positive for shortness of breath. Negative for cough, chest tightness and wheezing.   Cardiovascular: Negative for palpitations and leg swelling.  Gastrointestinal: Negative for nausea and vomiting.  Genitourinary: Negative for dysuria.  Musculoskeletal: Negative for joint swelling.  Skin: Negative for rash.  Neurological: Negative for headaches.  Hematological: Does not bruise/bleed easily.  Psychiatric/Behavioral: Negative for dysphoric mood. The patient is not nervous/anxious.        Objective:   Physical Exam  Nursing note and vitals reviewed. Constitutional: He is oriented to person, place, and time. He appears well-developed and well-nourished. No distress.  HENT:  Head: Normocephalic and atraumatic.  Right Ear: External ear normal.  Left Ear: External ear normal.  Mouth/Throat: Oropharynx is clear and moist. No oropharyngeal exudate.  Eyes: Conjunctivae and EOM are normal. Pupils are equal, round, and reactive to light. Right eye exhibits no discharge. Left eye exhibits no discharge. No scleral icterus.  Neck: Normal range of motion. Neck supple. No JVD present. No tracheal deviation present. No thyromegaly present.   Cardiovascular: Normal rate, regular rhythm and intact distal pulses.  Exam reveals no gallop and no friction rub.   No murmur heard. Pulmonary/Chest: Effort normal. No respiratory distress. He has no wheezes. He has rales. He exhibits no tenderness.       At the very base  Abdominal: Soft. Bowel sounds are normal. He exhibits no distension and no mass. There is no tenderness. There is no rebound and no guarding.  Musculoskeletal: Normal range of motion. He exhibits no edema and no tenderness.  Lymphadenopathy:    He has no cervical adenopathy.  Neurological: He is alert and oriented to person, place, and time. He has normal reflexes. No cranial nerve deficit. Coordination normal.  Skin: Skin is warm and dry. No rash noted. He is not diaphoretic. No erythema. No pallor.  Psychiatric: He has a normal mood and affect. His behavior is normal. Judgment and thought content normal.          Assessment & Plan:

## 2011-06-20 NOTE — Assessment & Plan Note (Signed)
On max oral meds with uncontrolled a1c 8.2 recent;  To d/c glyuride and januvia, cont metformin/actos (though may want to lessen or stop actos pending further cardiac eval), and start lantus;  He is demonstrated on admin in the office with 5 units now;  To start new oral regimen tomorrow with 15 units lantus (and ok to go to 20 in 2-3 days if obviously no low sugars), check cbg's qid to start, call in one wk with results, likely to need over 20 units eventually, f/u here 2 wks

## 2011-06-20 NOTE — Assessment & Plan Note (Signed)
Volume stable, Continue all other medications as before, f/u card as planned

## 2011-06-20 NOTE — Progress Notes (Signed)
Subjective:    Patient ID: Terry Peck, male    DOB: 13-Dec-1940, 71 y.o.   MRN: [id number]  HPI  Here with wife to f/u;  Initially seen for DOE, with abnormal cxr/CT chest and has f/u with pulm later today - ? pulm fibrosis;  Also with pulm nodule but pt reports long hx of known nodule in the past though details not clear;   Pt denies chest pain,  wheezing, orthopnea, PND, increased LE swelling, palpitations, dizziness or syncope.  Pt denies new neurological symptoms such as new headache, or facial or extremity weakness or numbness   Pt denies polydipsia, polyuria, or low sugar symptoms such as weakness or confusion improved with po intake.  Pt states overall good compliance with meds, trying to follow lower cholesterol, diabetic diet, wt overall stable but little exercise however, but a1c recent was elevated on max OHA meds with good compliance.  W/u also included finding of iron def anemia, has seen GI,  Also with abnormal Echo with reduced EF with Card f/u planned for next with with Dr Elease Hashimoto.  Also with new low b12, taking OTC b12 oral.  No new complaints Past Medical History  Diagnosis Date  . Allergy   . Arthritis   . Diabetes mellitus   . GERD (gastroesophageal reflux disease)   . Hypertension   . Colon polyps 11/23/2010  . Barrett's esophagus 11/23/2010  . Iron deficiency anemia 05/30/2011  . B12 deficiency 05/30/2011   Past Surgical History  Procedure Date  . Tonsillectomy 1952    reports that he quit smoking about 23 years ago. His smoking use included Cigarettes. He has a 5 pack-year smoking history. He has never used smokeless tobacco. He reports that he does not drink alcohol or use illicit drugs. family history includes Heart disease in his mother.  There is no history of Colon cancer. Allergies  Allergen Reactions  . Penicillins    Current Outpatient Prescriptions on File Prior to Visit  Medication Sig Dispense Refill  . Calcium-Magnesium 300-300 MG TABS Take by mouth daily.         . Calcium-Magnesium-Zinc 1000-400-15 MG TABS Take by mouth daily.        . Cholecalciferol (VITAMIN D) 1000 UNITS capsule Take 1,000 Units by mouth daily.        . Cyanocobalamin (VITAMIN B-12 PO) Take by mouth.      . ferrous fumarate (HEMOCYTE - 106 MG FE) 325 (106 FE) MG TABS Take 1 tablet by mouth daily.      . hydrochlorothiazide (HYDRODIURIL) 25 MG tablet Take 1 tablet (25 mg total) by mouth daily.  90 tablet  2  . ibuprofen (ADVIL,MOTRIN) 600 MG tablet Take 600 mg by mouth as needed.        . Lutein 20 MG CAPS Take by mouth daily.        . metFORMIN (GLUCOPHAGE) 850 MG tablet Take 850 mg by mouth 3 (three) times daily.        . methocarbamol (ROBAXIN) 750 MG tablet Take 1 tablet (750 mg total) by mouth as needed.  60 tablet  2  . Multiple Vitamin (MULTIVITAMIN) tablet Take 1 tablet by mouth daily.        . OMEGA 3 1000 MG CAPS Take by mouth.        Marland Kitchen omeprazole (PRILOSEC OTC) 20 MG tablet Take 1 tablet (20 mg total) by mouth 2 (two) times daily.  180 tablet  3  . peg 3350 powder (MOVIPREP)  100 G SOLR Take 1 kit (100 g total) by mouth as directed. See written handout  1 kit  0  . pioglitazone (ACTOS) 45 MG tablet Take 1 tablet (45 mg total) by mouth at bedtime.  90 tablet  3  . simvastatin (ZOCOR) 80 MG tablet Take 1 tablet (80 mg total) by mouth at bedtime.  90 tablet  3  . verapamil (CALAN-SR) 180 MG CR tablet Take 1 tablet (180 mg total) by mouth at bedtime.  90 tablet  2   Review of Systems Review of Systems  Constitutional: Negative for diaphoresis and unexpected weight change.  HENT: Negative for drooling and tinnitus.   Eyes: Negative for photophobia and visual disturbance.  Respiratory: Negative for choking and stridor.   Gastrointestinal: Negative for vomiting and blood in stool.  Genitourinary: Negative for hematuria and decreased urine volume.    Objective:   Physical Exam BP 132/72  Pulse 90  Temp(Src) 98.5 F (36.9 C) (Oral)  Ht 5\' 9"  (1.753 m)  Wt 189 lb 8  oz (85.957 kg)  BMI 27.98 kg/m2  SpO2 96% Physical Exam  VS noted Constitutional: Pt appears well-developed and well-nourished.  HENT: Head: Normocephalic.  Right Ear: External ear normal.  Left Ear: External ear normal.  Eyes: Conjunctivae and EOM are normal. Pupils are equal, round, and reactive to light.  Neck: Normal range of motion. Neck supple.  Cardiovascular: Normal rate and regular rhythm.   Pulmonary/Chest: Effort normal , no rales or wheezing Neurological: Pt is alert. No cranial nerve deficit.  Skin: Skin is warm. No erythema.  Psychiatric: Pt behavior is normal. Thought content normal.     Assessment & Plan:

## 2011-06-20 NOTE — Patient Instructions (Addendum)
#  lung nodule  - please sign release for [facility] to send CD rom of CT chest  - likely not cancer but if we cannot get [facility] records we have to follow this iwht CT in 9 months #Pulmonary fibrosis  - do not know why but acid reflux could have played a role  - please Serum: ESR, ANA, DS-DNA, RF, anti-CCP, ssA, ssB, scl-70, ANCA, Total CK,  Aldolase,  Hypersensitivity Pneumonitis Panel, ACE level - will call you with results to decide next step #Shortness of breath  - both due to heart and lung issues  - between the two the heart is more important priority - please see cardiology ASAP  - our  Nurse will ensure you have an appointment next few days #Followup - based on blood test; probably 1 month

## 2011-06-20 NOTE — Assessment & Plan Note (Signed)
stable overall by hx and exam, most recent data reviewed with pt, and pt to continue medical treatment as before, for pulm f/u later today  SpO2 Readings from Last 3 Encounters:  06/20/11 98%  06/20/11 96%  05/29/11 95%

## 2011-06-20 NOTE — Assessment & Plan Note (Signed)
stable overall by hx and exam, most recent data reviewed with pt, and pt to continue medical treatment as before  SpO2 Readings from Last 3 Encounters:  06/20/11 98%  06/20/11 96%  05/29/11 95%

## 2011-06-21 LAB — CK TOTAL AND CKMB (NOT AT ARMC): Total CK: 156 U/L (ref 7–232)

## 2011-06-21 LAB — SJOGREN'S SYNDROME ANTIBODS(SSA + SSB)
SSA (Ro) (ENA) Antibody, IgG: 12 AU/mL (ref <30)
SSB (La) (ENA) Antibody, IgG: <1 AU/mL (ref <30)

## 2011-06-21 LAB — RHEUMATOID FACTOR: Rhuematoid fact SerPl-aCnc: <10 IU/mL (ref <=14)

## 2011-06-21 LAB — ANTI-SCLERODERMA ANTIBODY: Scleroderma (Scl-70) (ENA) Antibody, IgG: <1 AU/mL (ref <30)

## 2011-06-21 LAB — ANA: Anti Nuclear Antibody(ANA): NEGATIVE

## 2011-06-21 LAB — ANTI-DNA ANTIBODY, DOUBLE-STRANDED: ds DNA Ab: 1 IU/mL (ref <30)

## 2011-06-21 LAB — ALDOLASE: Aldolase: 7.1 U/L (ref <=8.1)

## 2011-06-22 LAB — ANCA TITERS: C-ANCA: 1:20 {titer} — ABNORMAL HIGH

## 2011-06-25 ENCOUNTER — Encounter: Payer: Self-pay | Admitting: Internal Medicine

## 2011-06-25 DIAGNOSIS — J84112 Idiopathic pulmonary fibrosis: Secondary | ICD-10-CM | POA: Insufficient documentation

## 2011-06-25 NOTE — Assessment & Plan Note (Signed)
Ct Chest Wo Contrast  05/31/2011  *RADIOLOGY REPORT*  Clinical Data: Worsening dyspnea on exertion  CT CHEST WITHOUT CONTRAST  .  Ground-glass attenuation nodular density is noted within the left upper lobe measuring 1.8 cm, image 29.  Solid nodule within left lower lobe measures 6 mm.    He says he has hx of stable lung nodules but does not remember side that he was followed for over 2 years and these were stable  PLAN  #lung nodule  - please sign release for [facility] to send CD rom of CT chest  - likely not cancer but if we cannot get [facility] records we have to follow this iwht CT in 9 months

## 2011-06-25 NOTE — Assessment & Plan Note (Signed)
This is due to depressed EF on cardiac status and ILD. Between the 2 the more important, urgent and fixable lesion is the heart. So, he needs to see cardiology asap. He has an appt next week per hx and we will ensure this is the case. I have explained this to him and wife

## 2011-06-25 NOTE — Assessment & Plan Note (Signed)
#  Pulmonary fibrosis  - do not know why but acid reflux could have played a role esp with findings of patulous esophagus on CT chest feb 2013  - please Serum: ESR, ANA, DS-DNA, RF, anti-CCP, ssA, ssB, scl-70, ANCA, Total CK,  Aldolase,  Hypersensitivity Pneumonitis Panel, ACE level - will call you with results to decide next step which might involve biopsy or serial CT chest depnding on cardiac status

## 2011-06-28 ENCOUNTER — Telehealth: Payer: Self-pay

## 2011-06-28 ENCOUNTER — Ambulatory Visit (INDEPENDENT_AMBULATORY_CARE_PROVIDER_SITE_OTHER): Payer: Medicare Other | Admitting: Cardiovascular Disease

## 2011-06-28 ENCOUNTER — Encounter: Payer: Self-pay | Admitting: Cardiovascular Disease

## 2011-06-28 DIAGNOSIS — I5022 Chronic systolic (congestive) heart failure: Secondary | ICD-10-CM | POA: Insufficient documentation

## 2011-06-28 DIAGNOSIS — I447 Left bundle-branch block, unspecified: Secondary | ICD-10-CM

## 2011-06-28 DIAGNOSIS — I509 Heart failure, unspecified: Secondary | ICD-10-CM

## 2011-06-28 DIAGNOSIS — E785 Hyperlipidemia, unspecified: Secondary | ICD-10-CM

## 2011-06-28 MED ORDER — CARVEDILOL 6.25 MG PO TABS: 6.2500 mg | ORAL_TABLET | Freq: Two times a day (BID) | ORAL | Status: DC

## 2011-06-28 MED ORDER — INSULIN GLARGINE 100 UNIT/ML ~~LOC~~ SOLN: 24.0000 [IU] | Freq: Every day | SUBCUTANEOUS | Status: DC

## 2011-06-28 MED ORDER — ATORVASTATIN CALCIUM 40 MG PO TABS: 40.0000 mg | ORAL_TABLET | Freq: Every day | ORAL | Status: DC

## 2011-06-28 NOTE — Telephone Encounter (Signed)
Pretty good start it seems; ok to increase to 24 units, check CBG's qid over the weekend and call Monday with results;  He may not have to have more than 24 units

## 2011-06-28 NOTE — Patient Instructions (Addendum)
Your physician has requested that you have an adenosine myoview. Please follow instruction sheet, as given.  Your physician recommends that you schedule a follow-up appointment in: 2 WEEKS Your physician recommends that you return for lab work in: 2 WEEKS// BMET, BNP   Your physician has recommended you make the following change in your medication:   STOP SIMVASTATIN STOP VERAPAMIL  START ATORVASTATIN 40 MG DAILY FOR CHOLESTEROL START COREG 6.25 MG ONE TABLET TWICE DAILY FOR HEART

## 2011-06-28 NOTE — Telephone Encounter (Signed)
Patient called with BS numbers as requested by MD. Patient has increased insulin 20 units per day and checked BS 4 times daily per MD instructions. Call back number [phone]. BS results 06/22/11- [phone] 06/23/11- [phone] 06/24/11- [phone] 06/25/11- [phone] 06/26/11- [phone] 06/27/11- [phone]

## 2011-06-28 NOTE — Progress Notes (Signed)
Terry Peck Date of Birth  Jan 29, 1941 [facility]     [place] [facility]  [address]    [address]   [address] [place], [place]  [address]    [place], [place]  [address] [phone]  Fax  [phone]  [phone]  Fax [phone]  Problem list: 1. Left bundle branch block 2. Diabetes mellitus 3. Hyperlipidemia 4. Hypertension 5. Congestive heart failure-EF equals 30-35%  History of Present Illness:  [name] is a 71 yo who presents for evaluation of dyspnea.  He has been found to have a LBBB and has CHF with an EF of 30-35%.  He gets dyspneac with exercise ( mowing the lawn).  He denies any PND or orthopnea. He describes generalized fatigue with exertion.  The shortness of breath has been present for about a year. He admits to eating a fair amount of salty foods. He eats hot dogs, ham, bacon, sausage. He does not use any exercise but instead uses potassium chloride ( No salt).  He was seen by his medical doctor and was found to have a left bundle branch block. and was referred here for further evaluation. It was at that point that he had an echocardiogram which revealed global left ventricular systolic dysfunction with an EF of 30-35%.  Current Outpatient Prescriptions on File Prior to Visit  Medication Sig Dispense Refill  . Calcium-Magnesium 300-300 MG TABS Take by mouth daily.        . Calcium-Magnesium-Zinc 1000-400-15 MG TABS Take by mouth daily.        . Cholecalciferol (VITAMIN D) 1000 UNITS capsule Take 1,000 Units by mouth daily.        . Cyanocobalamin (VITAMIN B-12 PO) Take 5,000 mg by mouth daily.       . ferrous fumarate (HEMOCYTE - 106 MG FE) 325 (106 FE) MG TABS Take 1 tablet by mouth daily.      . hydrochlorothiazide (HYDRODIURIL) 25 MG tablet Take 1 tablet (25 mg total) by mouth daily.  90 tablet  2  . ibuprofen (ADVIL,MOTRIN) 600 MG tablet Take 600 mg by mouth as needed.        . Insulin Pen Needle 31G X 5 MM MISC 1 application by Does not  apply route daily.  100 each  5  . Lutein 20 MG CAPS Take by mouth daily.        . metFORMIN (GLUCOPHAGE) 850 MG tablet Take 850 mg by mouth 3 (three) times daily.        . methocarbamol (ROBAXIN) 750 MG tablet Take 1 tablet (750 mg total) by mouth as needed.  60 tablet  2  . Multiple Vitamin (MULTIVITAMIN) tablet Take 1 tablet by mouth daily.        . OMEGA 3 1000 MG CAPS Take by mouth.        Marland Kitchen omeprazole (PRILOSEC OTC) 20 MG tablet Take 1 tablet (20 mg total) by mouth 2 (two) times daily.  180 tablet  3  . peg 3350 powder (MOVIPREP) 100 G SOLR Take 1 kit (100 g total) by mouth as directed. See written handout  1 kit  0  . pioglitazone (ACTOS) 45 MG tablet Take 1 tablet (45 mg total) by mouth at bedtime.  90 tablet  3  . simvastatin (ZOCOR) 80 MG tablet Take 1 tablet (80 mg total) by mouth at bedtime.  90 tablet  3  . verapamil (CALAN-SR) 180 MG CR tablet Take 1 tablet (180 mg total) by  mouth at bedtime.  90 tablet  2    Allergies  Allergen Reactions  . Penicillins     Past Medical History  Diagnosis Date  . Allergy   . Arthritis   . Diabetes mellitus   . GERD (gastroesophageal reflux disease)   . Hypertension   . Colon polyps 11/23/2010  . Barrett's esophagus 11/23/2010  . Iron deficiency anemia 05/30/2011  . B12 deficiency 05/30/2011    Past Surgical History  Procedure Date  . Tonsillectomy 1952    History  Smoking status  . Former Smoker -- 0.5 packs/day for 10 years  . Types: Cigarettes  . Quit date: 04/23/1988  Smokeless tobacco  . Never Used    History  Alcohol Use No    Family History  Problem Relation Age of Onset  . Heart disease Mother   . Colon cancer Neg Hx     Reviw of Systems:  Reviewed in the HPI.  All other systems are negative.  Physical Exam: Blood pressure 128/84, pulse 88, height 5\' 8"  (1.727 m), weight 188 lb 6.4 oz (85.458 kg). General: Well developed, well nourished, in no acute distress.  Head: Normocephalic, atraumatic, sclera  non-icteric, mucus membranes are moist,   Neck: Supple. Carotids are 2 + without bruits. No JVD  Lungs: Clear bilaterally to auscultation.  Heart: regular rate.  normal  S1 S2. No murmurs, gallops or rubs.  Abdomen: Soft, non-tender, non-distended with normal bowel sounds. No hepatomegaly. No rebound/guarding. No masses.  Msk:  Strength and tone are normal  Extremities: No clubbing or cyanosis. No edema.  Distal pedal pulses are 2+ and equal bilaterally.  Neuro: Alert and oriented X 3. Moves all extremities spontaneously.  Psych:  Responds to questions appropriately with a normal affect.  ECG: His EKG reveals left bundle branch block.  Assessment / Plan:

## 2011-06-28 NOTE — Assessment & Plan Note (Addendum)
Yifan presents with congestive heart failure with an EF of 30-35%. In addition he has a left bundle branch block.  He's not on any standard congestive heart failure medication. We'll start him on carvedilol instead of his verapamil. We'll also discontinue the simvastatin and start him on atorvastatin.  He still eats a fair amount of salt. I've asked him to restrict his salt in his diet. For now we will continue with the HCTZ. I'll see him again in 2 weeks. At that time we will try to start low-dose ACE inhibitor.  We also need to evaluate the etiology of his congestive heart failure. We will get a Lexiscan Myoview study to rule out coronary ischemia. If this Myoview is abnormal then he'll need cardiac catheterization with possible PCI and/or bypass grafting.  If the Myoview is negative then we'll proceed with medical management. If his ejection fraction does not improve despite medical therapy then he'll need a biventricular pacer/defibrillator.

## 2011-06-29 NOTE — Telephone Encounter (Signed)
Called left message to call back 

## 2011-06-29 NOTE — Telephone Encounter (Signed)
Patient informed of MD's instructions

## 2011-07-02 ENCOUNTER — Telehealth: Payer: Self-pay

## 2011-07-02 NOTE — Telephone Encounter (Signed)
Phone call from the patient with BS for the weekend. Please advise  06/30/11 - [phone] 07/01/11 - [phone] 07/02/11 - 140 this AM

## 2011-07-02 NOTE — Telephone Encounter (Signed)
Ok to increase to 28 units per day;  Watch for sugars less than 110 any time of the day, but especially in the am  Robin to inform pt, and adjust med list

## 2011-07-03 ENCOUNTER — Telehealth: Payer: Self-pay

## 2011-07-03 ENCOUNTER — Ambulatory Visit (INDEPENDENT_AMBULATORY_CARE_PROVIDER_SITE_OTHER): Payer: Medicare Other | Admitting: Internal Medicine

## 2011-07-03 ENCOUNTER — Encounter: Payer: Self-pay | Admitting: Internal Medicine

## 2011-07-03 VITALS — BP 120/70 | HR 81 | Temp 98.5°F | Ht 68.0 in | Wt 189.5 lb

## 2011-07-03 DIAGNOSIS — I5022 Chronic systolic (congestive) heart failure: Secondary | ICD-10-CM

## 2011-07-03 DIAGNOSIS — I509 Heart failure, unspecified: Secondary | ICD-10-CM

## 2011-07-03 DIAGNOSIS — R894 Abnormal immunological findings in specimens from other organs, systems and tissues: Secondary | ICD-10-CM

## 2011-07-03 DIAGNOSIS — D509 Iron deficiency anemia, unspecified: Secondary | ICD-10-CM

## 2011-07-03 DIAGNOSIS — R768 Other specified abnormal immunological findings in serum: Secondary | ICD-10-CM

## 2011-07-03 HISTORY — DX: Other specified abnormal immunological findings in serum: R76.8

## 2011-07-03 LAB — HYPERSENSITIVITY PNUEMONITIS PROFILE

## 2011-07-03 NOTE — Telephone Encounter (Signed)
A user error has taken place: encounter opened in error, closed for administrative reasons.

## 2011-07-03 NOTE — Progress Notes (Signed)
Subjective:    Patient ID: Terry Peck, male    DOB: [birth year], 71 y.o.   MRN: [id number]  HPI  Here to f/u on his own today; overall doing very well, quite pleased with his care and attention to several signficant issues by specialists and expresses this today;  Has increased his lantus to 28 units with cbg's bid in the past 2 days now in the low 100's .  No low sugars.  Pt denies chest pain, increased sob or doe, wheezing, orthopnea, PND, increased LE swelling, palpitations, dizziness or syncope. Has stress test scheduled soon.  Also has endoscopies scheduled, as well as f/u with pulm mar 28.  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  Pt denies new neurological symptoms such as new headache, or facial or extremity weakness or numbness   Pt denies polydipsia, polyuria, or low sugar symptoms such as weakness or confusion improved with po intake.  Pt states overall good compliance with meds, trying to follow lower cholesterol, diabetic diet, wt overall stable but little exercise however.    Past Medical History  Diagnosis Date  . Allergy   . Arthritis   . Diabetes mellitus   . GERD (gastroesophageal reflux disease)   . Hypertension   . Colon polyps 11/23/2010  . Barrett's esophagus 11/23/2010  . Iron deficiency anemia 05/30/2011  . B12 deficiency 05/30/2011  . Abnormal ANCA test 07/03/2011   Past Surgical History  Procedure Date  . Tonsillectomy 1952    reports that he quit smoking about 23 years ago. His smoking use included Cigarettes. He has a 5 pack-year smoking history. He has never used smokeless tobacco. He reports that he does not drink alcohol or use illicit drugs. family history includes Heart disease in his mother.  There is no history of Colon cancer. Allergies  Allergen Reactions  . Penicillins    Current Outpatient Prescriptions on File Prior to Visit  Medication  Sig Dispense Refill  . atorvastatin (LIPITOR) 40 MG tablet Take 1 tablet (40 mg total) by mouth daily.  30 tablet  5  . Calcium-Magnesium 300-300 MG TABS Take by mouth daily.        . Calcium-Magnesium-Zinc 1000-400-15 MG TABS Take by mouth daily.        . carvedilol (COREG) 6.25 MG tablet Take 1 tablet (6.25 mg total) by mouth 2 (two) times daily.  60 tablet  5  . Cholecalciferol (VITAMIN D) 1000 UNITS capsule Take 1,000 Units by mouth daily.        . Cyanocobalamin (VITAMIN B-12 PO) Take 5,000 mg by mouth daily.       . ferrous fumarate (HEMOCYTE - 106 MG FE) 325 (106 FE) MG TABS Take 1 tablet by mouth daily.      . hydrochlorothiazide (HYDRODIURIL) 25 MG tablet Take 1 tablet (25 mg total) by mouth daily.  90 tablet  2  . ibuprofen (ADVIL,MOTRIN) 600 MG tablet Take 600 mg by mouth as needed.        . insulin glargine (LANTUS SOLOSTAR) 100 UNIT/ML injection Inject 28 Units into the skin at bedtime.      . Insulin Pen Needle 31G X 5 MM MISC 1 application by Does not apply route daily.  100 each  5  . Lutein 20 MG CAPS Take by mouth daily.        . metFORMIN (GLUCOPHAGE)  850 MG tablet Take 850 mg by mouth 3 (three) times daily.        . methocarbamol (ROBAXIN) 750 MG tablet Take 1 tablet (750 mg total) by mouth as needed.  60 tablet  2  . Multiple Vitamin (MULTIVITAMIN) tablet Take 1 tablet by mouth daily.        . OMEGA 3 1000 MG CAPS Take by mouth.        Marland Kitchen omeprazole (PRILOSEC OTC) 20 MG tablet Take 1 tablet (20 mg total) by mouth 2 (two) times daily.  180 tablet  3  . peg 3350 powder (MOVIPREP) 100 G SOLR Take 1 kit (100 g total) by mouth as directed. See written handout  1 kit  0  . pioglitazone (ACTOS) 45 MG tablet Take 1 tablet (45 mg total) by mouth at bedtime.  90 tablet  3   Review of Systems Review of Systems  Constitutional: Negative for diaphoresis and unexpected weight change.  HENT: Negative for drooling and tinnitus.   Eyes: Negative for photophobia and visual disturbance.    Respiratory: Negative for choking and stridor.   Gastrointestinal: Negative for vomiting and blood in stool.  Genitourinary: Negative for hematuria and decreased urine volume.  Objective:   Physical Exam BP 120/70  Pulse 81  Temp(Src) 98.5 F (36.9 C) (Oral)  Ht 5\' 8"  (1.727 m)  Wt 189 lb 8 oz (85.957 kg)  BMI 28.81 kg/m2  SpO2 92% Physical Exam  VS noted, obese, upbeat in demeanor Constitutional: Pt appears well-developed and well-nourished.  HENT: Head: Normocephalic.  Right Ear: External ear normal.  Left Ear: External ear normal.  Eyes: Conjunctivae and EOM are normal. Pupils are equal, round, and reactive to light.  Neck: Normal range of motion. Neck supple.  Cardiovascular: Normal rate and regular rhythm.   Pulmonary/Chest: Effort normal and breath sounds normal.  Abd:  Soft, NT, non-distended, + BS Neurological: Pt is alert. No cranial nerve deficit.  Skin: Skin is warm. No erythema.  Psychiatric: Pt behavior is normal. Thought content normal.     Assessment & Plan:

## 2011-07-03 NOTE — Patient Instructions (Signed)
Continue all other medications as before (see current list) Please keep your appointments with your specialists as you have planned - the stress test, GI procedures, and pulmonary evaluation on Mar 28 Please note if steroid such as prednisone is added to your treatment, we will need to adjust your insulin Please return in 6 mo with Lab testing done 3-5 days before

## 2011-07-03 NOTE — Telephone Encounter (Signed)
Called left message to call back 

## 2011-07-03 NOTE — Assessment & Plan Note (Signed)
stable overall by hx and exam with recent med changes per Dr Elease Hashimoto, volume stable, has stress test for next wk,

## 2011-07-03 NOTE — Telephone Encounter (Signed)
Patient informed and updated med. List.

## 2011-07-03 NOTE — Assessment & Plan Note (Signed)
Very well controlled now on 28 units lantus; Continue all other medications as before, for f/u labs soon

## 2011-07-03 NOTE — Assessment & Plan Note (Signed)
c-anca pos, suggestive of wegeners; may need lung biopsy I should think, pt has f/u with pulm mar 28

## 2011-07-03 NOTE — Assessment & Plan Note (Signed)
Pt has ongoing f/u with panendoscopy scheduled, cont iron, to f/u any worsening symptoms or concerns

## 2011-07-06 ENCOUNTER — Telehealth: Payer: Self-pay

## 2011-07-06 MED ORDER — GLUCOSE BLOOD VI DISK: DISK | Status: DC

## 2011-07-06 NOTE — Telephone Encounter (Signed)
A user error has taken place: encounter opened in error, closed for administrative reasons.

## 2011-07-10 ENCOUNTER — Ambulatory Visit (HOSPITAL_COMMUNITY): Payer: Medicare Other | Attending: Cardiology | Admitting: Radiology

## 2011-07-10 VITALS — BP 141/89 | Ht 68.0 in | Wt 185.0 lb

## 2011-07-10 DIAGNOSIS — R0602 Shortness of breath: Secondary | ICD-10-CM

## 2011-07-10 DIAGNOSIS — I509 Heart failure, unspecified: Secondary | ICD-10-CM | POA: Insufficient documentation

## 2011-07-10 DIAGNOSIS — I447 Left bundle-branch block, unspecified: Secondary | ICD-10-CM

## 2011-07-10 MED ORDER — REGADENOSON 0.4 MG/5ML IV SOLN
0.4000 mg | Freq: Once | INTRAVENOUS | Status: AC
  Administered 2011-07-10: 0.4 mg via INTRAVENOUS

## 2011-07-10 MED ORDER — TECHNETIUM TC 99M TETROFOSMIN IV KIT
30.0000 | PACK | Freq: Once | INTRAVENOUS | Status: AC | PRN
  Administered 2011-07-10: 30 via INTRAVENOUS

## 2011-07-10 MED ORDER — TECHNETIUM TC 99M TETROFOSMIN IV KIT
10.0000 | PACK | Freq: Once | INTRAVENOUS | Status: AC | PRN
  Administered 2011-07-10: 10 via INTRAVENOUS

## 2011-07-10 NOTE — Progress Notes (Signed)
[facility] SITE 3 NUCLEAR MED [address] [place] [address] [phone]  Cardiology Nuclear Med Study  Terry Peck is a 71 y.o. male     MRN : [id number]     DOB: [birth year]  Procedure Date: 07/10/2011  Nuclear Med Background Indication for Stress Test:  Evaluation for Ischemia and New LBBB History:  >20 yrs ago OZH:YQMVHQ per patient; 2/13 Echo:EF=30-35%, mild TR; CHF. Cardiac Risk Factors: Family History - CAD, History of Smoking, Hypertension, IDDM Type 2, LBBB, Lipids and NIDDM  Symptoms:  DOE and Fatigue with Exertion   Nuclear Pre-Procedure Caffeine/Decaff Intake:  None NPO After: 7:00pm   Lungs:  clear O2 Sat: 96% on room air. IV 0.9% NS with Angio Cath:  22g  IV Site: R Hand  IV Started by:  Stanton Kidney, EMT-P  Chest Size (in):  42 Cup Size: n/a  Height: 5\' 8"  (1.727 m)  Weight:  185 lb (83.915 kg)  BMI:  Body mass index is 28.13 kg/(m^2). Tech Comments:  CBG= 180 @ 6am, per patient.    Nuclear Med Study 1 or 2 day study: 1 day  Stress Test Type:  Eugenie Birks  Reading MD: Terry Clement, MD  Order Authorizing Provider:  Kristeen Miss, MD  Resting Radionuclide: Technetium 15m Tetrofosmin  Resting Radionuclide Dose: 11.   Stress Radionuclide:  Technetium 34m Tetrofosmin  Stress Radionuclide Dose: 33.0 mCi           Stress Protocol Rest HR: 71 Stress HR: 94  Rest BP: 141/89 Stress BP: 141/90  Exercise Time (min): n/a METS: n/a   Predicted Max HR: 149 bpm % Max HR: 63.09 bpm Rate Pressure Product: 46962   Dose of Adenosine (mg):  n/a Dose of Lexiscan: 0.4 mg  Dose of Atropine (mg): n/a Dose of Dobutamine: n/a mcg/kg/min (at max HR)  Stress Test Technologist: Smiley Houseman, CMA-N  Nuclear Technologist:  Domenic Polite, CNMT     Rest Procedure:  Myocardial perfusion imaging was performed at rest 45 minutes following the intravenous administration of Technetium 17m Tetrofosmin.  Rest ECG: LBBB  Stress Procedure:  The patient  received IV Lexiscan 0.4 mg over 15-seconds.  Technetium 62m Tetrofosmin injected at 30-seconds.  There were no significant changes with Lexiscan bolus.  Quantitative spect images were obtained after a 45 minute delay.  Stress ECG: Uninteretable due to baseline LBBB  QPS Raw Data Images:  Normal; no motion artifact; normal heart/lung ratio. Stress Images:  There is decreased uptake in the anterior wall. Small, mild decrease in perfusion in mid anterior myocardium. Rest Images:  Normal homogeneous uptake in all areas of the myocardium. Subtraction (SDS):  These findings are consistent with small, mild reversible mid anterior ischemia. Transient Ischemic Dilatation (Normal <1.22):  1.00 Lung/Heart Ratio (Normal <0.45):  0.31  Quantitative Gated Spect Images QGS EDV:  191 ml QGS ESV:  135 ml  Impression Exercise Capacity:  Lexiscan with no exercise. BP Response:  Normal blood pressure response. Clinical Symptoms:  No significant symptoms noted. ECG Impression:  Baseline:  LBBB.  EKG uninterpretable due to LBBB at rest and stress. Comparison with Prior Nuclear Study: No images to compare  Overall Impression:  Abnormal stress nuclear study. Dilated left ventricle with global hypokinesis. Suspect small mild area of reversible ischemia in mid anterior wall.  The degree of severe LV dysfunction is out of proportion to the mild ischemia noted.  LV Ejection Fraction: 29%.  LV Wall Motion:  Severe global hypokinesis  Terry Peck

## 2011-07-11 ENCOUNTER — Telehealth: Payer: Self-pay | Admitting: *Deleted

## 2011-07-11 NOTE — Telephone Encounter (Signed)
Discussed positive stress test with wife/ husband at work/ has app next week, aware and will keep f/u app to discuss results further.

## 2011-07-18 ENCOUNTER — Other Ambulatory Visit: Payer: Medicare Other

## 2011-07-18 ENCOUNTER — Encounter: Payer: Self-pay | Admitting: Cardiovascular Disease

## 2011-07-18 ENCOUNTER — Ambulatory Visit (INDEPENDENT_AMBULATORY_CARE_PROVIDER_SITE_OTHER): Payer: Medicare Other | Admitting: Cardiovascular Disease

## 2011-07-18 ENCOUNTER — Encounter: Payer: Self-pay | Admitting: Gastroenterology

## 2011-07-18 ENCOUNTER — Ambulatory Visit (AMBULATORY_SURGERY_CENTER): Payer: Medicare Other | Admitting: Gastroenterology

## 2011-07-18 VITALS — BP 147/75 | HR 75 | Temp 97.2°F | Resp 18 | Ht 69.0 in | Wt 195.0 lb

## 2011-07-18 VITALS — BP 149/89 | HR 80 | Ht 68.0 in | Wt 182.1 lb

## 2011-07-18 DIAGNOSIS — K5730 Diverticulosis of large intestine without perforation or abscess without bleeding: Secondary | ICD-10-CM

## 2011-07-18 DIAGNOSIS — K2990 Gastroduodenitis, unspecified, without bleeding: Secondary | ICD-10-CM

## 2011-07-18 DIAGNOSIS — D126 Benign neoplasm of colon, unspecified: Secondary | ICD-10-CM

## 2011-07-18 DIAGNOSIS — Z86010 Personal history of colonic polyps: Secondary | ICD-10-CM

## 2011-07-18 DIAGNOSIS — D1330 Benign neoplasm of unspecified part of small intestine: Secondary | ICD-10-CM

## 2011-07-18 DIAGNOSIS — I509 Heart failure, unspecified: Secondary | ICD-10-CM

## 2011-07-18 DIAGNOSIS — D649 Anemia, unspecified: Secondary | ICD-10-CM

## 2011-07-18 DIAGNOSIS — K2270 Barrett's esophagus without dysplasia: Secondary | ICD-10-CM

## 2011-07-18 DIAGNOSIS — I5022 Chronic systolic (congestive) heart failure: Secondary | ICD-10-CM

## 2011-07-18 DIAGNOSIS — R0602 Shortness of breath: Secondary | ICD-10-CM

## 2011-07-18 DIAGNOSIS — K2970 Gastritis, unspecified, without bleeding: Secondary | ICD-10-CM

## 2011-07-18 LAB — GLUCOSE, CAPILLARY
Glucose-Capillary: 129 mg/dL — ABNORMAL HIGH (ref 70–99)
Glucose-Capillary: 144 mg/dL — ABNORMAL HIGH (ref 70–99)

## 2011-07-18 LAB — BASIC METABOLIC PANEL
BUN: 14 mg/dL (ref 6–23)
Calcium: 9.5 mg/dL (ref 8.4–10.5)
Creatinine, Ser: 0.8 mg/dL (ref 0.4–1.5)

## 2011-07-18 LAB — BRAIN NATRIURETIC PEPTIDE: Pro B Natriuretic peptide (BNP): 190 pg/mL — ABNORMAL HIGH (ref 0.0–100.0)

## 2011-07-18 MED ORDER — FUROSEMIDE 40 MG PO TABS: 40.0000 mg | ORAL_TABLET | Freq: Every day | ORAL | Status: DC

## 2011-07-18 MED ORDER — SODIUM CHLORIDE 0.9 % IV SOLN: 500.0000 mL | INTRAVENOUS | Status: DC

## 2011-07-18 MED ORDER — LISINOPRIL 10 MG PO TABS: 10.0000 mg | ORAL_TABLET | Freq: Every day | ORAL | Status: DC

## 2011-07-18 NOTE — Patient Instructions (Signed)
DISCHARGE INSTRUCTIONS GIVEN WITH VERBAL UNDERSTANDING. HANDOUTS ON POLYPS, DIVERTICULOSIS,GASTRITIS, AND A HIATAL HERNIA GIVEN. RESUME PREVIOUS MEDICATIONS.YOU HAD AN ENDOSCOPIC PROCEDURE TODAY AT THE [facility]: Refer to the procedure report that was given to you for any specific questions about what was found during the examination.  If the procedure report does not answer your questions, please call your gastroenterologist to clarify.  If you requested that your care partner not be given the details of your procedure findings, then the procedure report has been included in a sealed envelope for you to review at your convenience later.  YOU SHOULD EXPECT: Some feelings of bloating in the abdomen. Passage of more gas than usual.  Walking can help get rid of the air that was put into your GI tract during the procedure and reduce the bloating. If you had a lower endoscopy (such as a colonoscopy or flexible sigmoidoscopy) you may notice spotting of blood in your stool or on the toilet paper. If you underwent a bowel prep for your procedure, then you may not have a normal bowel movement for a few days.  DIET: Your first meal following the procedure should be a light meal and then it is ok to progress to your normal diet.  A half-sandwich or bowl of soup is an example of a good first meal.  Heavy or fried foods are harder to digest and may make you feel nauseous or bloated.  Likewise meals heavy in dairy and vegetables can cause extra gas to form and this can also increase the bloating.  Drink plenty of fluids but you should avoid alcoholic beverages for 24 hours.  ACTIVITY: Your care partner should take you home directly after the procedure.  You should plan to take it easy, moving slowly for the rest of the day.  You can resume normal activity the day after the procedure however you should NOT DRIVE or use heavy machinery for 24 hours (because of the sedation medicines used during the test).     SYMPTOMS TO REPORT IMMEDIATELY: A gastroenterologist can be reached at any hour.  During normal business hours, 8:30 AM to 5:00 PM Monday through Friday, call [phone].  After hours and on weekends, please call the GI answering service at [phone] who will take a message and have the physician on call contact you.   Following lower endoscopy (colonoscopy or flexible sigmoidoscopy):  Excessive amounts of blood in the stool  Significant tenderness or worsening of abdominal pains  Swelling of the abdomen that is new, acute  Fever of 100F or higher  Following upper endoscopy (EGD)  Vomiting of blood or coffee ground material  New chest pain or pain under the shoulder blades  Painful or persistently difficult swallowing  New shortness of breath  Fever of 100F or higher  Black, tarry-looking stools  FOLLOW UP: If any biopsies were taken you will be contacted by phone or by letter within the next 1-3 weeks.  Call your gastroenterologist if you have not heard about the biopsies in 3 weeks.  Our staff will call the home number listed on your records the next business day following your procedure to check on you and address any questions or concerns that you may have at that time regarding the information given to you following your procedure. This is a courtesy call and so if there is no answer at the home number and we have not heard from you through the emergency physician on call, we will assume  that you have returned to your regular daily activities without incident.  SIGNATURES/CONFIDENTIALITY: You and/or your care partner have signed paperwork which will be entered into your electronic medical record.  These signatures attest to the fact that that the information above on your After Visit Summary has been reviewed and is understood.  Full responsibility of the confidentiality of this discharge information lies with you and/or your care-partner.

## 2011-07-18 NOTE — Progress Notes (Signed)
Terry Peck Date of Birth  Apr 08, 1941 [facility]     [place] [facility]  [address]    [address]   [address] [place], [place]  [address]    [place], [place]  [address] [phone]  Fax  [phone]  [phone]  Fax [phone]  Problem list: 1. Left bundle branch block 2. Diabetes mellitus 3. Hyperlipidemia 4. Hypertension 5. Congestive heart failure-EF equals 30-35%  History of Present Illness:  Terry Peck is a 71 yo who presents for evaluation of dyspnea.  He has been found to have a LBBB and has CHF with an EF of 30-35%.  He gets dyspneac with exercise ( mowing the lawn).  He denies any PND or orthopnea. He describes generalized fatigue with exertion.  The shortness of breath has been present for about a year. He admits to eating a fair amount of salty foods. He eats hot dogs, ham, bacon, sausage. He does not use any exercise but instead uses potassium chloride ( No salt).  He was seen by his medical doctor and was found to have a left bundle branch block. and was referred here for further evaluation. It was at that point that he had an echocardiogram which revealed global left ventricular systolic dysfunction with an EF of 30-35%.  He had a stress Myoview study which reveals an ejection fraction 29%. There is global left ventricular dilatation. There is no evidence of ischemia. His symptoms have improved dramatically since we've stopped his verapamil and start him on carvedilol.  Current Outpatient Prescriptions on File Prior to Visit  Medication Sig Dispense Refill  . atorvastatin (LIPITOR) 40 MG tablet Take 1 tablet (40 mg total) by mouth daily.  30 tablet  5  . Calcium-Magnesium 300-300 MG TABS Take by mouth daily.        . Calcium-Magnesium-Zinc 1000-400-15 MG TABS Take by mouth daily.        . carvedilol (COREG) 6.25 MG tablet Take 1 tablet (6.25 mg total) by mouth 2 (two) times daily.  60 tablet  5  . Cholecalciferol (VITAMIN D) 1000 UNITS capsule  Take 1,000 Units by mouth daily.        . Cyanocobalamin (VITAMIN B-12 PO) Take 5,000 mg by mouth daily.       . ferrous fumarate (HEMOCYTE - 106 MG FE) 325 (106 FE) MG TABS Take 1 tablet by mouth daily.      . Glucose Blood (BAYER [name] 2 TEST) DISK Test four times daily as directed  200 each  11  . ibuprofen (ADVIL,MOTRIN) 600 MG tablet Take 600 mg by mouth as needed.        . insulin glargine (LANTUS SOLOSTAR) 100 UNIT/ML injection Inject 28 Units into the skin at bedtime.      . Insulin Pen Needle 31G X 5 MM MISC 1 application by Does not apply route daily.  100 each  5  . Lutein 20 MG CAPS Take by mouth daily.        . metFORMIN (GLUCOPHAGE) 850 MG tablet Take 850 mg by mouth 3 (three) times daily.        . methocarbamol (ROBAXIN) 750 MG tablet Take 1 tablet (750 mg total) by mouth as needed.  60 tablet  2  . Multiple Vitamin (MULTIVITAMIN) tablet Take 1 tablet by mouth daily.        . OMEGA 3 1000 MG CAPS Take by mouth.        Marland Kitchen omeprazole (PRILOSEC OTC) 20  MG tablet Take 1 tablet (20 mg total) by mouth 2 (two) times daily.  180 tablet  3  . peg 3350 powder (MOVIPREP) 100 G SOLR Take 1 kit (100 g total) by mouth as directed. See written handout  1 kit  0  . pioglitazone (ACTOS) 45 MG tablet Take 1 tablet (45 mg total) by mouth at bedtime.  90 tablet  3    Allergies  Allergen Reactions  . Iodine   . Penicillins     Past Medical History  Diagnosis Date  . Allergy   . Arthritis   . Diabetes mellitus   . GERD (gastroesophageal reflux disease)   . Hypertension   . Colon polyps 11/23/2010  . Barrett's esophagus 11/23/2010  . Iron deficiency anemia 05/30/2011  . B12 deficiency 05/30/2011  . Abnormal ANCA test 07/03/2011    Past Surgical History  Procedure Date  . Tonsillectomy 1952    History  Smoking status  . Former Smoker -- 0.5 packs/day for 10 years  . Types: Cigarettes  . Quit date: 04/23/1988  Smokeless tobacco  . Never Used    History  Alcohol Use No    Family  History  Problem Relation Age of Onset  . Heart disease Mother   . Colon cancer Neg Hx     Reviw of Systems:  Reviewed in the HPI.  All other systems are negative.  Physical Exam: Blood pressure 149/89, pulse 80, height 5\' 8"  (1.727 m), weight 182 lb 1.9 oz (82.609 kg). General: Well developed, well nourished, in no acute distress.  Head: Normocephalic, atraumatic, sclera non-icteric, mucus membranes are moist,   Neck: Supple. Carotids are 2 + without bruits. No JVD  Lungs: Clear bilaterally to auscultation.  Heart: regular rate.  normal  S1 S2. No murmurs, gallops or rubs.  Abdomen: Soft, non-tender, non-distended with normal bowel sounds. No hepatomegaly. No rebound/guarding. No masses.  Msk:  Strength and tone are normal  Extremities: No clubbing or cyanosis. No edema.  Distal pedal pulses are 2+ and equal bilaterally.  Neuro: Alert and oriented X 3. Moves all extremities spontaneously.  Psych:  Responds to questions appropriately with a normal affect.  ECG: 06/28/2011 His EKG reveals left bundle branch block.  Assessment / Plan:

## 2011-07-18 NOTE — Assessment & Plan Note (Signed)
Terry Peck presents with significant congestive heart failure by echo. His Myoview study reveals an ejection fraction of 29%. The left ventricle is dilated. There is no evidence of reversible ischemia.  Discontinue his HCTZ and start him on Lasix 40 mg a day.  Start him on lisinopril 10 mg a day. I'll see her in 2 weeks for a basic metabolic profile and BNP.  We discussed the fact that he may need a biventricular pacemaker. If his left ventricular systolic function and left bundle branch block are persistent after 3 weeks of maximal medical therapy then he'll need to be considered for biventricular pacer.

## 2011-07-18 NOTE — Patient Instructions (Signed)
Your physician recommends that you schedule a follow-up appointment in: 2 weeks  Your physician recommends that you return for lab work in: today, bmet bnp  Your physician has recommended you make the following change in your medication:   STOP HCTZ START LASIX 40 MG IN MORNING START LISINOPRIL 10 MG DAILY

## 2011-07-18 NOTE — Op Note (Signed)
[facility] [address] [place], [place]  [address]  ENDOSCOPY PROCEDURE REPORT  PATIENT:  Terry Peck, Terry Peck  MR#:  [id number] BIRTHDATE:  1940-06-08, 71 yrs. old  GENDER:  male ENDOSCOPIST:  Rachael Fee, MD PROCEDURE DATE:  07/18/2011 PROCEDURE:  EGD with biopsy, [address] ASA CLASS:  Class II INDICATIONS:  IDA, history of Barrett's (outside endoscopies) MEDICATIONS:  There was residual sedation effect present from prior procedure., These medications were titrated to patient response per physician's verbal order, Fentanyl 25 mcg IV, Versed 3 mg IV TOPICAL ANESTHETIC:  Cetacaine Spray  DESCRIPTION OF PROCEDURE:   After the risks benefits and alternatives of the procedure were thoroughly explained, informed consent was obtained.  The LB [id number] [id number] endoscope was introduced through the mouth and advanced to the second portion of the duodenum, without limitations.  The instrument was slowly withdrawn as the mucosa was fully examined. <<PROCEDUREIMAGES>> There was mild to moderate gastritis, biopsies taken and sent to pathology (see image1).  A hiatal hernia was found. This was 5cm (see image3).  Barrett's esophagus was found. There was typical appearing, non-nodular Barrett's esophagus change from GE junction (34cm) up to 29cm from incisors. Four quadrant biopsies taken at 34, 32, and 30cm from incisors (see image5 and image4).  Otherwise the examination was normal. Normal appearing duodenum mucosa was biopsied (see image2).    Retroflexed views revealed no abnormalities.    The scope was then withdrawn from the patient and the procedure completed. COMPLICATIONS:  None  ENDOSCOPIC IMPRESSION: 1) Moderate gastritis, biopsied to check for H. pylori 2) 4-5cm hiatal hernia 3) 5cm segment of non-nodular Barrett's esophagus, multiple biopsies taken to check for dysplasia 4) Otherwise normal examination; duodenum biopsied to check for Celiac  Sprue  RECOMMENDATIONS: If biopsies show H. pylori, he will be started on appropriate antibiotics.  If biopsies show Celiac Sprue changes, dietary referral will be made. If no dysplasia is noted on Barrett's biopsies, then repeat EGD for surveillance in 3 years.  ______________________________ Rachael Fee, MD  cc: Oliver Barre, MD  n. Rosalie Doctor:   Rachael Fee at 07/18/2011 02:19 PM  Carmelia Roller, [phone]

## 2011-07-18 NOTE — Progress Notes (Signed)
Patient did not experience any of the following events: a burn prior to discharge; a fall within the facility; wrong site/side/patient/procedure/implant event; or a hospital transfer or hospital admission upon discharge from the facility. (G8907) Patient did not have preoperative order for IV antibiotic SSI prophylaxis. (G8918)  

## 2011-07-18 NOTE — Op Note (Signed)
[facility] [address] [place], [place]  [address]  COLONOSCOPY PROCEDURE REPORT  PATIENT:  Terry Peck, Terry Peck  MR#:  [id number] BIRTHDATE:  07-17-1940, 71 yrs. old  GENDER:  male ENDOSCOPIST:  Rachael Fee, MD REF. BY:  Oliver Barre, M.D. PROCEDURE DATE:  07/18/2011 PROCEDURE:  Colonoscopy with biopsy and snare polypectomy ASA CLASS:  Class II INDICATIONS:  adenomatous polyps, [facility], last colonsocopy 5-6 years ago (per patient) MEDICATIONS:   Fentanyl 50 mcg IV, These medications were titrated to patient response per physician's verbal order, Versed 5 mg IV  DESCRIPTION OF PROCEDURE:   After the risks benefits and alternatives of the procedure were thoroughly explained, informed consent was obtained.  Digital rectal exam was performed and revealed no rectal masses.   The LB 180AL [id number] endoscope was introduced through the anus and advanced to the cecum, which was identified by both the appendix and ileocecal valve, without limitations.  The quality of the prep was good..  The instrument was then slowly withdrawn as the colon was fully examined. <<PROCEDUREIMAGES>> FINDINGS: Two sessile polyps were found. One was 2mm across, located in transverse colon, removed with forceps, sent to pathology (jar 1). One was 4mm across, located in sigmoid, removed with cold snare, sent to pathology (jar 2) (see image4).  Mild diverticulosis was found throughout the colon (see image1).  This was otherwise a normal examination of the colon (see image5 and image2).   Retroflexed views in the rectum revealed no abnormalities. COMPLICATIONS:  None  ENDOSCOPIC IMPRESSION: 1) Two small polyps, both were removed and both were sent to pathlogy 2) Mild diverticulosis throughout the colon 3) Otherwise normal examination  RECOMMENDATIONS: 1) Given your personal history of adenomatous (pre-cancerous) polyps, you will need a repeat colonoscopy in 5 years even if the polyps removed  today are not precancerous. 2) You will receive a letter within 1-2 weeks with the results of your biopsy as well as final recommendations. Please call my office if you have not received a letter after 3 weeks.  ______________________________ Rachael Fee, MD  n. eSIGNED:   Rachael Fee at 07/18/2011 02:00 PM  Carmelia Roller, [phone]

## 2011-07-19 ENCOUNTER — Ambulatory Visit (INDEPENDENT_AMBULATORY_CARE_PROVIDER_SITE_OTHER): Payer: Medicare Other | Admitting: Internal Medicine

## 2011-07-19 ENCOUNTER — Telehealth: Payer: Self-pay | Admitting: *Deleted

## 2011-07-19 ENCOUNTER — Encounter: Payer: Self-pay | Admitting: Internal Medicine

## 2011-07-19 VITALS — BP 118/78 | HR 80 | Temp 97.8°F | Ht 69.0 in | Wt 190.4 lb

## 2011-07-19 DIAGNOSIS — J8410 Pulmonary fibrosis, unspecified: Secondary | ICD-10-CM

## 2011-07-19 DIAGNOSIS — R0989 Other specified symptoms and signs involving the circulatory and respiratory systems: Secondary | ICD-10-CM

## 2011-07-19 DIAGNOSIS — J849 Interstitial pulmonary disease, unspecified: Secondary | ICD-10-CM

## 2011-07-19 DIAGNOSIS — R911 Solitary pulmonary nodule: Secondary | ICD-10-CM

## 2011-07-19 NOTE — Telephone Encounter (Signed)
No answer let ring 10 times.

## 2011-07-19 NOTE — Patient Instructions (Signed)
#  lung nodule  - there are 2 spots in lung and there is possibiloty this could be lung cancer  - we absolultey need the scan fom [facility]; not just report but the CD rom of CT chest  - see if you can get it to [place] next 2 weeks if not repeat ct chest by Aug 28, 2011 (3rd months CT) #Pulmonary fibrosis  - do not know why but acid reflux could have played a role  - at this point we will follow this - you need breathing test in 6 months or so .   - we will also use the CT chest results for the nodule to follow the pulmonary fibrosis #Shortness of breath  - both due to heart and lung issues  - between the two the heart is more important priority -  Follow Dr Elease Hashimoto advise  - I will check with your cardiologist about suitability to [facility] #Followup - based on your call about the CT chest from [facility]

## 2011-07-19 NOTE — Progress Notes (Signed)
Subjective:    Patient ID: Terry Peck, male    DOB: Mar 09, 1941, 71 y.o.   MRN: [id number]  HPI IOV 06/20/2011 . PMD is Dr Oliver Barre. REferred for lung nodule and dyspnea  71year old male.  reports that he quit smoking about 23 years ago. His smoking use included Cigarettes. He has a 5 pack-year smoking history (1/2 pack per day x 10 years). He has never used smokeless tobacco. Body mass index is 28.15 kg/(m^2). Known DM with nocturia - started on insulin 06/20/2011 , BP, Arthritis, Barretts - seeing Dr Christella Hartigan  Repoirts hx of small lung nodule with serial CT fu at [facility] for 2 years at [facility] ending 5 years ago. STable and dc from followup. Does not remeemer which side  Reports insidious onset of dyspnea on exertion x 1  Year. Progressive gradually. Improved by rest. Notices when he mows yard, walking briskly one flight of stairs notices dyspnea.Always improved by rest. Wife notices he is not able to keep up with her during walks outside the home. No associated weight gain, or admissions to hospitals, no new medical problems diagnosed since onset of symptoms. Denies associated chest pain, cough, fever, weight gain, hemoptysis, edema, orthopnea, paroxysmal nocturnal dyspnea, wheezing. Systolic function was moderately to severely reduced.   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 Has active GERD +. Denies collagen vascular disease. No steel dust, metal dust or asbestos exposure. Denies amio intake. Denies chemo. Does not have pet birds or feathery pillows  ECHO - 05/31/11: ejection fraction was in the range of 30% to 35%. There is hypokinesis of the inferior and inferoseptal myocardium. Doppler parameters are consistent with abnormal left ventricular relaxation (grade 1 diastolic Dysfunction). - Ventricular septum: These changes are consistent with a left bundle branch block. - Mitral valve: Mildly calcified annulus. Mildly  thickenedleaflets . - Pulmonary arteries: Systolic pressure was mildly increased. PA peak pressure: 37mm Hg (S).  Ct Chest Wo Contrast  05/31/2011  *RT CHEST WITHOUT CONTRAST  Technique:   Findings:  No enlarged axillary or supraclavicular lymph nodes.  There is no enlarged mediastinal or hilar lymph nodes.  No pericardial or pleural effusion identified.  There is a patulous and fluid-filled thoracic esophagus.  There is peripheral and basilar predominant interstitial reticulation.  Bibasilar subpleural honeycombing is also identified.  Mild bronchiectasis is present in the lung bases.  Ground-glass attenuation nodular density is noted within the left upper lobe measuring 1.8 cm, image 29.  Solid nodule within left lower lobe measures 6 mm.  Review of the visualized osseous structures is unremarkable.  Limited imaging through the upper abdomen is significant for a left renal cyst.  Only partially visualized and incompletely characterized without IV contrast.  IMPRESSION:  1.  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 2.  Ground-glass attenuating nodule within the left upper lobe is nonspecific and may be related to interstitial lung disease.  Low grade pulmonary adenocarcinoma may have a similar appearance. Follow-up imaging in 3 months is recommended to ensure stability.    PFT 06/06/11:Signs of restrictions c/w the ILD we see on CT present even though TLC 5L/81%  is normal. This is because FVC 2.9L/69% and reduced (fev1 is 2.45L/86% and normal). ratip is 84 and normal. DLCO is reduced 11/50%  REC  #lung nodule  - please sign release for [facility] to send CD rom of CT chest  - likely not cancer but  if we cannot get [facility] records we have to follow this iwht CT in 9 months  #Pulmonary fibrosis  - do not know why but acid reflux could have played a role  - please Serum: ESR, ANA, DS-DNA, RF, anti-CCP, ssA, ssB, scl-70, ANCA, Total CK, Aldolase, Hypersensitivity Pneumonitis  Panel, ACE level  - will call you with results to decide next step  #Shortness of breath  - both due to heart and lung issues  - between the two the heart is more important priority - please see cardiology ASAP  - our Nurse will ensure you have an appointment next few days  #Followup  - based on blood test; probably 1 month   OV 07/19/2011  Review dyspnea related to ILD NOS and new chronic systolic CHF. Since last visit has seen cardiology Dr Elease Hashimoto. States that with cards med mgmt: dyspnea improved a "little bit". Able to do more. Can now climb 2 flights of stairs non-stop (last visit only 1 flight of stairs). 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 Dilated left ventricle with global hypokinesis. Suspect small mild area of reversible ischemia in mid anterior wall. The degree of severe LV dysfunction is out of proportion to the mild ischemia noted. LV Ejection Fraction: 29%. LV Wall Motion: Severe global hypokinesis).  Meds adjusted. Cath on hold. Plan to place pacer if no improvement in EF with med mgmt. In temrs of workup for pulmonary fibrosis:    - 06/20/11 auto-immune test result: all negative including HP panel except anca is trace positive at 1:20 which is the cut off between negative and positive. ESR 29  Past, Family, Social reviewed: no change since last visit except as noted in HPI   Review of Systems  Constitutional: Negative for fever and unexpected weight change.  HENT: Negative for ear pain, nosebleeds, congestion, sore throat, rhinorrhea, sneezing, trouble swallowing, dental problem, postnasal drip and sinus pressure.   Eyes: Negative for redness and itching.  Respiratory: Negative for cough, chest tightness, shortness of breath and wheezing.   Cardiovascular: Negative for palpitations and leg swelling.  Gastrointestinal: Negative for nausea and vomiting.  Genitourinary: Negative for dysuria.  Musculoskeletal: Negative for  joint swelling.  Skin: Negative for rash.  Neurological: Negative for headaches.  Hematological: Does not bruise/bleed easily.  Psychiatric/Behavioral: Negative for dysphoric mood. The patient is not nervous/anxious.        Objective:   Physical Exam Nursing note and vitals reviewed. Constitutional: He is oriented to person, place, and time. He appears well-developed and well-nourished. No distress.  HENT:  Head: Normocephalic and atraumatic.  Right Ear: External ear normal.  Left Ear: External ear normal.  Mouth/Throat: Oropharynx is clear and moist. No oropharyngeal exudate.  Eyes: Conjunctivae and EOM are normal. Pupils are equal, round, and reactive to light. Right eye exhibits no discharge. Left eye exhibits no discharge. No scleral icterus.  Neck: Normal range of motion. Neck supple. No JVD present. No tracheal deviation present. No thyromegaly present.  Cardiovascular: Normal rate, regular rhythm and intact distal pulses.  Exam reveals no gallop and no friction rub.   No murmur heard. Pulmonary/Chest: Effort normal. No respiratory distress. He has no wheezes. He has rales. He exhibits no tenderness.       At the very base  Abdominal: Soft. Bowel sounds are normal. He exhibits no distension and no mass. There is no tenderness. There is no rebound and no guarding.  Musculoskeletal: Normal range  of motion. He exhibits no edema and no tenderness.  Lymphadenopathy:    He has no cervical adenopathy.  Neurological: He is alert and oriented to person, place, and time. He has normal reflexes. No cranial nerve deficit. Coordination normal.  Skin: Skin is warm and dry. No rash noted. He is not diaphoretic. No erythema. No pallor.  Psychiatric: He has a normal mood and affect. His behavior is normal. Judgment and thought content normal.          Assessment & Plan:

## 2011-07-23 ENCOUNTER — Encounter: Payer: Self-pay | Admitting: Internal Medicine

## 2011-07-23 ENCOUNTER — Telehealth: Payer: Self-pay | Admitting: Internal Medicine

## 2011-07-23 DIAGNOSIS — R0600 Dyspnea, unspecified: Secondary | ICD-10-CM

## 2011-07-23 DIAGNOSIS — J8410 Pulmonary fibrosis, unspecified: Secondary | ICD-10-CM

## 2011-07-23 NOTE — Assessment & Plan Note (Signed)
autoimune panel negative except ANCA is trace positive and of unclear signoificance. ESR is 29. Given cardiac status more pressing we will elect to follow along including repeat anca, pft, and ct at followup. Will exploit need for ct because of lung nodules to track ILD changes. HE verablized and agrees with plan

## 2011-07-23 NOTE — Telephone Encounter (Signed)
Helo Phil  Is it ok for me to refer this gentleman to [facility] rehab ?  Thanks MR

## 2011-07-23 NOTE — Assessment & Plan Note (Signed)
#  lung nodule  - there are 2 spots in lung and there is possibiloty this could be lung cancer  - we absolultey need the scan fom [facility]; not just report but the CD rom of CT chest  - see if you can get it to [place] next 2 weeks if not repeat ct chest by Aug 28, 2011 (3rd months CT) and consider current nodule as new

## 2011-07-23 NOTE — Assessment & Plan Note (Signed)
#  Shortness of breath  - both due to heart and lung issues  - between the two the heart is more important priority -  Follow Dr Elease Hashimoto advise  - I will check with your cardiologist about suitability to attend [facility] #Followup - based on your call about the CT chest from [facility]

## 2011-07-24 NOTE — Telephone Encounter (Signed)
Yes, pulmonary rehab would be fine.  Thanks  PN

## 2011-07-24 NOTE — Telephone Encounter (Signed)
Terry Peck  Dr Elease Hashimoto has agreed for pulm rehab. Please refer him basis of pulmpnary fibrosis and systolic heart failure and dyspnea.   Also, please call him and have him get his CT CD Drom from [facility] asap  Appt around mid may 2013

## 2011-07-25 NOTE — Telephone Encounter (Signed)
I spoke with the pt and advised of order for pulm rehab, this has been placed. The pt states he has not had any luck getting in touch with the [facility] in [place]. He states he has left multiple messages and has not received a return call. He provided me with the contact number of [phone], his doc there was Hardie Lora, MD, and his nurse was Waverly Ferrari. I have LMTCB with them to see if they can help facilitate getting the pt CT. Carron Curie, CMA

## 2011-07-25 NOTE — Telephone Encounter (Signed)
Terry Peck or Alsea from the [place] returned call. Says nurse had requested a fax # for the [place] (for the release form)- fax # is [phone].

## 2011-07-27 NOTE — Telephone Encounter (Signed)
[name]  Do not know if Tivis Ringer sent this to you or not.   Thanks MR

## 2011-07-30 ENCOUNTER — Encounter: Payer: Self-pay | Admitting: Gastroenterology

## 2011-07-31 NOTE — Telephone Encounter (Signed)
Ok I re faxed release form to number given. Carron Curie, CMA appt set for 09-10-11 at 2:15pm. Carron Curie, CMA

## 2011-08-01 ENCOUNTER — Encounter: Payer: Self-pay | Admitting: Cardiovascular Disease

## 2011-08-01 ENCOUNTER — Ambulatory Visit (INDEPENDENT_AMBULATORY_CARE_PROVIDER_SITE_OTHER): Payer: Medicare Other | Admitting: Cardiovascular Disease

## 2011-08-01 VITALS — BP 120/78 | HR 83 | Ht 69.0 in | Wt 189.0 lb

## 2011-08-01 DIAGNOSIS — E785 Hyperlipidemia, unspecified: Secondary | ICD-10-CM

## 2011-08-01 DIAGNOSIS — I509 Heart failure, unspecified: Secondary | ICD-10-CM

## 2011-08-01 DIAGNOSIS — R0602 Shortness of breath: Secondary | ICD-10-CM

## 2011-08-01 DIAGNOSIS — I10 Essential (primary) hypertension: Secondary | ICD-10-CM

## 2011-08-01 LAB — BASIC METABOLIC PANEL
BUN: 29 mg/dL — ABNORMAL HIGH (ref 6–23)
CO2: 26 mEq/L (ref 19–32)
Calcium: 9.6 mg/dL (ref 8.4–10.5)
Glucose, Bld: 217 mg/dL — ABNORMAL HIGH (ref 70–99)
Sodium: 141 mEq/L (ref 135–145)

## 2011-08-01 MED ORDER — CARVEDILOL 6.25 MG PO TABS: 12.5000 mg | ORAL_TABLET | Freq: Two times a day (BID) | ORAL | Status: DC

## 2011-08-01 NOTE — Progress Notes (Signed)
[name] Date of Birth  1941/03/17 [facility]     [place] [facility]  [address]    [address]   [address] [place], [place]  [address]    [place] [address], [place]  [address] [phone]  Fax  [phone]  [phone]  Fax [phone]  Problem list: 1. Left bundle branch block 2. Diabetes mellitus 3. Hyperlipidemia 4. Hypertension 5. Congestive heart failure-EF equals 30-35%  History of Present Illness:  Michel is a 71 yo who presents for evaluation of dyspnea.  He has been found to have a LBBB and has CHF with an EF of 30-35%.  He gets dyspneac with exercise ( mowing the lawn).  He denies any PND or orthopnea. He describes generalized fatigue with exertion.  The shortness of breath has been present for about a year. He admits to eating a fair amount of salty foods. He eats hot dogs, ham, bacon, sausage. He does not use any exercise but instead uses potassium chloride ( No salt).  He was seen by his medical doctor and was found to have a left bundle branch block. and was referred here for further evaluation. It was at that point that he had an echocardiogram which revealed global left ventricular systolic dysfunction with an EF of 30-35%.  He had a stress Myoview study which reveals an ejection fraction 29%. There is global left ventricular dilatation. There is no evidence of ischemia. His symptoms have improved dramatically since we've stopped his verapamil and start him on carvedilol.  Continue to gradually titrate up his medications. We change his HCTZ to Lasix during his last visit. We also added lisinopril. He's feeling better.  He is able to do more of his normal activities without severe dyspnea.  Current Outpatient Prescriptions on File Prior to Visit  Medication Sig Dispense Refill  . atorvastatin (LIPITOR) 40 MG tablet Take 1 tablet (40 mg total) by mouth daily.  30 tablet  5  . Calcium-Magnesium 300-300 MG TABS Take by mouth daily.        .  Calcium-Magnesium-Zinc 1000-400-15 MG TABS Take by mouth daily.        . carvedilol (COREG) 6.25 MG tablet Take 1 tablet (6.25 mg total) by mouth 2 (two) times daily.  60 tablet  5  . Cholecalciferol (VITAMIN D) 1000 UNITS capsule Take 1,000 Units by mouth daily.        . Cyanocobalamin (VITAMIN B-12 PO) Take 5,000 mg by mouth daily.       . ferrous fumarate (HEMOCYTE - 106 MG FE) 325 (106 FE) MG TABS Take 1 tablet by mouth daily.      . furosemide (LASIX) 40 MG tablet Take 1 tablet (40 mg total) by mouth daily.  30 tablet  5  . Glucose Blood (BAYER [name] 2 TEST) DISK Test four times daily as directed  200 each  11  . hydrochlorothiazide (HYDRODIURIL) 25 MG tablet       . ibuprofen (ADVIL,MOTRIN) 600 MG tablet Take 600 mg by mouth as needed.        . insulin glargine (LANTUS SOLOSTAR) 100 UNIT/ML injection Inject 28 Units into the skin at bedtime.      . Insulin Pen Needle 31G X 5 MM MISC 1 application by Does not apply route daily.  100 each  5  . lisinopril (PRINIVIL,ZESTRIL) 10 MG tablet Take 1 tablet (10 mg total) by mouth daily.  30 tablet  5  . Lutein 20 MG CAPS Take  by mouth daily.        . metFORMIN (GLUCOPHAGE) 850 MG tablet Take 850 mg by mouth 3 (three) times daily.        . methocarbamol (ROBAXIN) 750 MG tablet Take 1 tablet (750 mg total) by mouth as needed.  60 tablet  2  . Multiple Vitamin (MULTIVITAMIN) tablet Take 1 tablet by mouth daily.        . OMEGA 3 1000 MG CAPS Take by mouth.        Marland Kitchen omeprazole (PRILOSEC OTC) 20 MG tablet Take 1 tablet (20 mg total) by mouth 2 (two) times daily.  180 tablet  3  . pioglitazone (ACTOS) 45 MG tablet Take 1 tablet (45 mg total) by mouth at bedtime.  90 tablet  3  . DISCONTD: simvastatin (ZOCOR) 80 MG tablet Take 1 tablet (80 mg total) by mouth at bedtime.  90 tablet  3    Allergies  Allergen Reactions  . Iodine   . Penicillins     Past Medical History  Diagnosis Date  . Allergy   . Arthritis   . Diabetes mellitus   . GERD  (gastroesophageal reflux disease)   . Hypertension   . Colon polyps 11/23/2010  . Barrett's esophagus 11/23/2010  . Iron deficiency anemia 05/30/2011  . B12 deficiency 05/30/2011  . Abnormal ANCA test 07/03/2011    Past Surgical History  Procedure Date  . Tonsillectomy 1952    History  Smoking status  . Former Smoker -- 0.5 packs/day for 10 years  . Types: Cigarettes  . Quit date: 04/23/1988  Smokeless tobacco  . Never Used    History  Alcohol Use No    Family History  Problem Relation Age of Onset  . Heart disease Mother   . Colon cancer Neg Hx     Reviw of Systems:  Reviewed in the HPI.  All other systems are negative.  Physical Exam: Blood pressure 120/78, pulse 83, height 5\' 9"  (1.753 m), weight 189 lb (85.73 kg). General: Well developed, well nourished, in no acute distress.  Head: Normocephalic, atraumatic, sclera non-icteric, mucus membranes are moist,   Neck: Supple. Carotids are 2 + without bruits. No JVD  Lungs: soft bilateral rales in the bases  Heart: regular rate.  normal  S1 S2. No murmurs, gallops or rubs.  Abdomen: Soft, non-tender, non-distended with normal bowel sounds. No hepatomegaly. No rebound/guarding. No masses.  Msk:  Strength and tone are normal  Extremities: No clubbing or cyanosis. No edema.  Distal pedal pulses are 2+ and equal bilaterally.  Neuro: Alert and oriented X 3. Moves all extremities spontaneously.  Psych:  Responds to questions appropriately with a normal affect.  ECG:  Assessment / Plan:

## 2011-08-01 NOTE — Patient Instructions (Addendum)
Your physician has recommended you make the following change in your medication: Increase your carvedilol to 12.5 mg twice daily.  You may take 2 tablets of your 6.25 mg twice daily until you run out.  Extra quantity has been sent in to your pharmacy   Your physician recommends that you schedule a follow-up appointment in: 3 months with Dr. Elease Hashimoto.  You will have a repeat of today's labs, BMP, BNP; a couple of days prior to your follow up with Dr. Elease Hashimoto.     Your physician recommends that you return for lab work in: 3 months for BMP and BNP;  We would like you to try to have these done a couple of days before your follow up appointment.

## 2011-08-01 NOTE — Assessment & Plan Note (Signed)
Terry Peck presents with significant congestive heart failure by echo. His Myoview study reveals an ejection fraction of 29%. The left ventricle is dilated. There is no evidence of reversible ischemia.  He's doing very well on current medications. We'll increase his carvedilol to 12.5 mg twice a day. We'll continue with his current dose of Lasix. He has a few rales in the bases but I think that this is more due to atelectasis and volume overload. He has improved his diet is no longer eating a salty foods. He has no leg edema.  We'll check a basic metabolic profile and a BNP today. I'll see him again in 3 months for an office visit and BMP.

## 2011-08-02 ENCOUNTER — Telehealth: Payer: Self-pay

## 2011-08-02 MED ORDER — METFORMIN HCL 850 MG PO TABS: 850.0000 mg | ORAL_TABLET | Freq: Three times a day (TID) | ORAL | Status: DC

## 2011-08-02 NOTE — Telephone Encounter (Signed)
Medication refilled per pt request.

## 2011-09-06 ENCOUNTER — Telehealth: Payer: Self-pay | Admitting: Internal Medicine

## 2011-09-06 NOTE — Telephone Encounter (Signed)
Received copies from [facility] ,on 09/06/14 . Forwarded 64  pages to Dr. Marchelle Gearing ,for review.

## 2011-09-10 ENCOUNTER — Other Ambulatory Visit: Payer: Medicare Other

## 2011-09-10 ENCOUNTER — Ambulatory Visit (INDEPENDENT_AMBULATORY_CARE_PROVIDER_SITE_OTHER): Payer: Medicare Other | Admitting: Internal Medicine

## 2011-09-10 ENCOUNTER — Encounter: Payer: Self-pay | Admitting: Internal Medicine

## 2011-09-10 VITALS — BP 120/72 | HR 84 | Temp 98.2°F | Ht 68.0 in | Wt 188.2 lb

## 2011-09-10 DIAGNOSIS — R911 Solitary pulmonary nodule: Secondary | ICD-10-CM

## 2011-09-10 DIAGNOSIS — Z6825 Body mass index (BMI) 25.0-25.9, adult: Secondary | ICD-10-CM

## 2011-09-10 DIAGNOSIS — J849 Interstitial pulmonary disease, unspecified: Secondary | ICD-10-CM

## 2011-09-10 DIAGNOSIS — R0989 Other specified symptoms and signs involving the circulatory and respiratory systems: Secondary | ICD-10-CM

## 2011-09-10 DIAGNOSIS — J8410 Pulmonary fibrosis, unspecified: Secondary | ICD-10-CM

## 2011-09-10 DIAGNOSIS — E663 Overweight: Secondary | ICD-10-CM | POA: Insufficient documentation

## 2011-09-10 NOTE — Assessment & Plan Note (Signed)
#  Pulmonary fibrosis  - do not know why but acid reflux could have played a role - repeat anca blood test because this was trace positive and will help [place] know if this played a role - depending on findings on CT chest, might have to consider lung biopsy  - you need breathing test in 6 months or so .

## 2011-09-10 NOTE — Patient Instructions (Addendum)
#  lung nodule  - there are 2 spots in lung and there is possibiloty this could be lung cancer  - because we could not get scan from [facility], please do CT chest wihtout contrast now  #Pulmonary fibrosis  - do not know why but acid reflux could have played a role - repeat anca blood test because this was trace positive and will help [place] know if this played a role - depending on findings on CT chest, might have to consider lung biopsy  - you need breathing test in 6 months or so .   #Shortness of breath  - both due to heart and lung issues; glad you are beter  - between the two the heart is more important priority -  Follow Dr Elease Hashimoto advise - Dr Elease Hashimoto gave okay and will refer you to pulmonary rehab  #Overweight  - follow [facility] Diet sheet I discussed with you; stick to foods in left lane   #Followup - based on results of blood test and ct chest; if they are okay, I will see you in 3 months with breathing test

## 2011-09-10 NOTE — Assessment & Plan Note (Signed)
Body mass index is 28.62 kg/(m^2).   PLAN  counseled extensively on low glycemic diet and gave the [facility] diet sheet

## 2011-09-10 NOTE — Assessment & Plan Note (Signed)
#  Shortness of breath  - both due to heart and lung issues; glad you are beter  - between the two the heart is more important priority -  Follow Dr Elease Hashimoto advise - Dr Elease Hashimoto gave okay and will refer you to pulmonary rehab #Followup - based on results of blood test and ct chest; if they are okay, I will see you in 3 months with breathing test

## 2011-09-10 NOTE — Assessment & Plan Note (Signed)
#  lung nodule  - there are 2 spots in lung and there is possibiloty this could be lung cancer  - because we could not get scan from [facility], please do CT chest wihtout contrast now

## 2011-09-10 NOTE — Progress Notes (Signed)
Subjective:    Patient ID: Terry Peck, male    DOB: Mar 09, 1941, 71 y.o.   MRN: [id number]  HPI IOV 06/20/2011 . PMD is Dr Oliver Barre. REferred for lung nodule and dyspnea  71year old male.  reports that he quit smoking about 23 years ago. His smoking use included Cigarettes. He has a 5 pack-year smoking history (1/2 pack per day x 10 years). He has never used smokeless tobacco. Body mass index is 28.15 kg/(m^2). Known DM with nocturia - started on insulin 06/20/2011 , BP, Arthritis, Barretts - seeing Dr Christella Hartigan  Repoirts hx of small lung nodule with serial CT fu at [facility] for 2 years at [facility] ending 5 years ago. STable and dc from followup. Does not remeemer which side  Reports insidious onset of dyspnea on exertion x 1  Year. Progressive gradually. Improved by rest. Notices when he mows yard, walking briskly one flight of stairs notices dyspnea.Always improved by rest. Wife notices he is not able to keep up with her during walks outside the home. No associated weight gain, or admissions to hospitals, no new medical problems diagnosed since onset of symptoms. Denies associated chest pain, cough, fever, weight gain, hemoptysis, edema, orthopnea, paroxysmal nocturnal dyspnea, wheezing. Systolic function was moderately to severely reduced.   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 Has active GERD +. Denies collagen vascular disease. No steel dust, metal dust or asbestos exposure. Denies amio intake. Denies chemo. Does not have pet birds or feathery pillows  ECHO - 05/31/11: ejection fraction was in the range of 30% to 35%. There is hypokinesis of the inferior and inferoseptal myocardium. Doppler parameters are consistent with abnormal left ventricular relaxation (grade 1 diastolic Dysfunction). - Ventricular septum: These changes are consistent with a left bundle branch block. - Mitral valve: Mildly calcified annulus. Mildly  thickenedleaflets . - Pulmonary arteries: Systolic pressure was mildly increased. PA peak pressure: 37mm Hg (S).  Ct Chest Wo Contrast  05/31/2011  *RT CHEST WITHOUT CONTRAST  Technique:   Findings:  No enlarged axillary or supraclavicular lymph nodes.  There is no enlarged mediastinal or hilar lymph nodes.  No pericardial or pleural effusion identified.  There is a patulous and fluid-filled thoracic esophagus.  There is peripheral and basilar predominant interstitial reticulation.  Bibasilar subpleural honeycombing is also identified.  Mild bronchiectasis is present in the lung bases.  Ground-glass attenuation nodular density is noted within the left upper lobe measuring 1.8 cm, image 29.  Solid nodule within left lower lobe measures 6 mm.  Review of the visualized osseous structures is unremarkable.  Limited imaging through the upper abdomen is significant for a left renal cyst.  Only partially visualized and incompletely characterized without IV contrast.  IMPRESSION:  1.  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 2.  Ground-glass attenuating nodule within the left upper lobe is nonspecific and may be related to interstitial lung disease.  Low grade pulmonary adenocarcinoma may have a similar appearance. Follow-up imaging in 3 months is recommended to ensure stability.    PFT 06/06/11:Signs of restrictions c/w the ILD we see on CT present even though TLC 5L/81%  is normal. This is because FVC 2.9L/69% and reduced (fev1 is 2.45L/86% and normal). ratip is 84 and normal. DLCO is reduced 11/50%  REC  #lung nodule  - please sign release for [facility] to send CD rom of CT chest  - likely not cancer but  if we cannot get [facility] records we have to follow this iwht CT in 9 months  #Pulmonary fibrosis  - do not know why but acid reflux could have played a role  - please Serum: ESR, ANA, DS-DNA, RF, anti-CCP, ssA, ssB, scl-70, ANCA, Total CK, Aldolase, Hypersensitivity Pneumonitis  Panel, ACE level  - will call you with results to decide next step  #Shortness of breath  - both due to heart and lung issues  - between the two the heart is more important priority - please see cardiology ASAP  - our Nurse will ensure you have an appointment next few days  #Followup  - based on blood test; probably 1 month   OV 07/19/2011  Review dyspnea related to ILD NOS and new chronic systolic CHF. Since last visit has seen cardiology Dr Elease Hashimoto. States that with cards med mgmt: dyspnea improved a "little bit". Able to do more. Can now climb 2 flights of stairs non-stop (last visit only 1 flight of stairs). 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 Dilated left ventricle with global hypokinesis. Suspect small mild area of reversible ischemia in mid anterior wall. The degree of severe LV dysfunction is out of proportion to the mild ischemia noted. LV Ejection Fraction: 29%. LV Wall Motion: Severe global hypokinesis).  Meds adjusted. Cath on hold. Plan to place pacer if no improvement in EF with med mgmt. In temrs of workup for pulmonary fibrosis:    - 06/20/11 auto-immune test result: all negative including HP panel except anca is trace positive at 1:20 which is the cut off between negative and positive. ESR 29  Past, Family, Social reviewed: no change since last visit except as noted in HPI    #lung nodule  - there are 2 spots in lung and there is possibiloty this could be lung cancer  - we absolultey need the scan fom [facility]; not just report but the CD rom of CT chest  - see if you can get it to [place] next 2 weeks if not repeat ct chest by Aug 28, 2011 (3rd months CT)  #Pulmonary fibrosis  - do not know why but acid reflux could have played a role  - at this point we will follow this - you need breathing test in 6 months or so .  - we will also use the CT chest results for the nodule to follow the pulmonary fibrosis  #Shortness of  breath  - both due to heart and lung issues  - between the two the heart is more important priority - Follow Dr Elease Hashimoto advise  - I will check with your cardiologist about suitability to attend pulmonary rehab at [facility]  #Followup  - based on your call about the CT chest from [facility]  OV 09/10/2011  Followup pulmonary nodule, ILD NOS and dyspnea due to ILD and Systolic CHF   - Last visit was 2 months ago. In terms of nodule, we have been unable to get the actual cd rom from [facility]. So, he will need fu testing here. In terms of pulm fibrosis, Bx  Plans placed on hold due to cardiac status at time of last ov. I earn from 08/01/11 Dr Elease Hashimoto cards notes that plan is for continued medical mgmt. His dyspnea is improved a lot due to great med mgmt and bnp 80s as of 08/01/11. He is able to mow yard now. He thinks dietary portion control and resultant  weight loss has also helped with dyspnea   Past, Family, Social reviewed: no change since last visit   Review of Systems  Constitutional: Negative for fever and unexpected weight change.  HENT: Negative for ear pain, nosebleeds, congestion, sore throat, rhinorrhea, sneezing, trouble swallowing, dental problem, postnasal drip and sinus pressure.   Eyes: Negative for redness and itching.  Respiratory: Negative for cough, chest tightness, shortness of breath and wheezing.   Cardiovascular: Negative for palpitations and leg swelling.  Gastrointestinal: Negative for nausea and vomiting.  Genitourinary: Negative for dysuria.  Musculoskeletal: Negative for joint swelling.  Skin: Negative for rash.  Neurological: Negative for headaches.  Hematological: Does not bruise/bleed easily.  Psychiatric/Behavioral: Negative for dysphoric mood. The patient is not nervous/anxious.        Objective:   Physical Exam Nursing note and vitals reviewed. Constitutional: He is oriented to person, place, and time. He appears well-developed and well-nourished. No distress.    HENT:  Head: Normocephalic and atraumatic.  Right Ear: External ear normal.  Left Ear: External ear normal.  Mouth/Throat: Oropharynx is clear and moist. No oropharyngeal exudate.  Eyes: Conjunctivae and EOM are normal. Pupils are equal, round, and reactive to light. Right eye exhibits no discharge. Left eye exhibits no discharge. No scleral icterus.  Neck: Normal range of motion. Neck supple. No JVD present. No tracheal deviation present. No thyromegaly present.  Cardiovascular: Normal rate, regular rhythm and intact distal pulses.  Exam reveals no gallop and no friction rub.   No murmur heard. Pulmonary/Chest: Effort normal. No respiratory distress. He has no wheezes. He has rales. He exhibits no tenderness.       At the very base  Abdominal: Soft. Bowel sounds are normal. He exhibits no distension and no mass. There is no tenderness. There is no rebound and no guarding.  Musculoskeletal: Normal range of motion. He exhibits no edema and no tenderness.  Lymphadenopathy:    He has no cervical adenopathy.  Neurological: He is alert and oriented to person, place, and time. He has normal reflexes. No cranial nerve deficit. Coordination normal.  Skin: Skin is warm and dry. No rash noted. He is not diaphoretic. No erythema. No pallor.  Psychiatric: He has a normal mood and affect. His behavior is normal. Judgment and thought content normal.            Assessment & Plan:

## 2011-09-11 LAB — ANCA SCREEN W REFLEX TITER
Atypical p-ANCA Screen: NEGATIVE
p-ANCA Screen: NEGATIVE

## 2011-09-12 ENCOUNTER — Ambulatory Visit (INDEPENDENT_AMBULATORY_CARE_PROVIDER_SITE_OTHER)
Admission: RE | Admit: 2011-09-12 | Discharge: 2011-09-12 | Disposition: A | Discharge status: Home / Self Care | Payer: Medicare Other | Source: Ambulatory Visit | Attending: Cardiology | Admitting: Cardiology

## 2011-09-12 DIAGNOSIS — J8410 Pulmonary fibrosis, unspecified: Secondary | ICD-10-CM

## 2011-09-12 DIAGNOSIS — R911 Solitary pulmonary nodule: Secondary | ICD-10-CM

## 2011-09-13 ENCOUNTER — Telehealth: Payer: Self-pay | Admitting: Internal Medicine

## 2011-09-13 DIAGNOSIS — J8410 Pulmonary fibrosis, unspecified: Secondary | ICD-10-CM

## 2011-09-13 NOTE — Telephone Encounter (Signed)
    10:07 AM  Called patient with anca blood test result - got him on work phone   - ANCA blood test result negative  - CT scan   - lung nodule report - nodule smaller than before  - scarring or fibrosis in lung - stable as before   Therefore, recommendation  - follow weight loss wit  [facility] diet sheet  - do rehab (already done) - followup 6 months with PFT and based on clinical course and PFT will decide on lung bx (front desk infromed to give appt in 6 months)

## 2011-09-24 ENCOUNTER — Encounter (HOSPITAL_COMMUNITY): Payer: Self-pay

## 2011-09-24 ENCOUNTER — Encounter (HOSPITAL_COMMUNITY)
Admission: RE | Admit: 2011-09-24 | Discharge: 2011-09-24 | Disposition: A | Discharge status: Home / Self Care | Payer: Medicare Other | Source: Ambulatory Visit | Attending: Internal Medicine | Admitting: Internal Medicine

## 2011-09-24 DIAGNOSIS — J8410 Pulmonary fibrosis, unspecified: Secondary | ICD-10-CM | POA: Insufficient documentation

## 2011-09-24 DIAGNOSIS — Z5189 Encounter for other specified aftercare: Secondary | ICD-10-CM | POA: Insufficient documentation

## 2011-09-24 HISTORY — DX: Heart failure, unspecified: I50.9

## 2011-09-24 NOTE — Progress Notes (Addendum)
[name] came in today for orientation to Pulmonary Rehab.  History and Medications were reviewed with patient and nurse assessment was done.  6 min walk test done, oxygen saturation were stable 92-96 % during walk test on room air.  Demonstration and practice of PLB using pulse oximeter.  Patient able to return demonstration satisfactorily. Safety and hand hygiene in the exercise area reviewed with patient.  Patient voices understanding.  He plans to start exercise on 09-25-11,

## 2011-09-25 ENCOUNTER — Encounter (HOSPITAL_COMMUNITY)
Admission: RE | Admit: 2011-09-25 | Discharge: 2011-09-25 | Disposition: A | Discharge status: Home / Self Care | Payer: Medicare Other | Source: Ambulatory Visit | Attending: Internal Medicine | Admitting: Internal Medicine

## 2011-09-25 NOTE — Progress Notes (Signed)
1st day of exercise in Pulmonary Rehab.  Oriented to equipment use and safety, RPE and Dyspnea scale.  Demonstration and practice of PLB on each exercise station.  Tolerated exercise well. Vital signs stable.

## 2011-09-27 ENCOUNTER — Encounter (HOSPITAL_COMMUNITY)
Admission: RE | Admit: 2011-09-27 | Discharge: 2011-09-27 | Disposition: A | Discharge status: Home / Self Care | Payer: Medicare Other | Source: Ambulatory Visit | Attending: Internal Medicine | Admitting: Internal Medicine

## 2011-10-02 ENCOUNTER — Encounter (HOSPITAL_COMMUNITY): Payer: Medicare Other

## 2011-10-04 ENCOUNTER — Encounter (HOSPITAL_COMMUNITY): Payer: Medicare Other

## 2011-10-08 ENCOUNTER — Other Ambulatory Visit: Payer: Self-pay | Admitting: Cardiovascular Disease

## 2011-10-08 NOTE — Telephone Encounter (Signed)
Fax Received. Refill Completed. Terry Peck (R.M.A)   

## 2011-10-09 ENCOUNTER — Encounter (HOSPITAL_COMMUNITY)
Admission: RE | Admit: 2011-10-09 | Discharge: 2011-10-09 | Disposition: A | Discharge status: Home / Self Care | Payer: Medicare Other | Source: Ambulatory Visit | Attending: Internal Medicine | Admitting: Internal Medicine

## 2011-10-09 NOTE — Progress Notes (Signed)
Terry Peck 71 y.o. male Nutrition Note Spoke with pt. Pt is overweight.  Pt eats 3 meals a day; most prepared at home.  Making healthy food choices the majority of the time.  Pt's Rate Your Plate results reviewed with pt.  Pt expressed understanding.  Pt does not avoid salty food; uses lower sodium canned soups. Sodium content of "low-sodium" foods explained.  Pt adds No Salt to food. Pt encouraged to discontinue No Salt and use less table salt due to potassium chloride in No Salt is not recommended for pt with DM or CHF. Pt reports his PCP is aware that he uses No Salt and his last BMP was "fine." The role of sodium in lung disease reviewed with pt.  Pt is diabetic.  Last A1c in 05/2011 indicates pt CBG's not optimally controlled. Pt checks CBG's every morning.  CBG's reportedly  78- 110 mg/dL before breakfast. Pt unable to recall recommended CBG ranges before meals .  Recommended CBG ranges reviewed with pt.  Pt expressed understanding.  Pt declined individual nutrition education for diabetes at this time stating he had been previously educated re: DM. Nutrition Diagnosis   Excessive sodium intake related to over consumption of processed food as evidenced by frequent consumption of convenience food/ canned vegetables and eating out frequently.   Food-and nutrition-related knowledge deficit related to lack of exposure to information as related to diagnosis of pulmonary disease   Overweight related to excessive energy intake as evidenced by a BMI of 29 Nutrition Rx/Est. Daily Nutrition Needs for: ? wt loss 1350-1850 Kcal  105-125 gm protein   1500 mg or less sodium     175 gm CHO Nutrition Intervention   Pt's individual nutrition plan and goals reviewed with pt.   Benefits of adopting healthy eating habits discussed when pt's Rate Your Plate reviewed.   Pt to attend the Nutrition and Lung Disease class   Handout given for: DM Blitz and DM Q & A classes.   Continual client-centered nutrition  education by RD, as part of interdisciplinary care. Goal(s) 1. Pt to identify and limit food sources of sodium. 2. Identify food quantities necessary to achieve wt loss of  -2# per week to a goal wt of 74.1-82.3 kg (163-181 lb) at graduation from pulmonary rehab. Monitor and Evaluate progress toward nutrition goal with team.    Pulmonary Rehab Nutrition Screen  Terry Peck 71 y.o. male             Ht: 67.5" Ht Readings from Last 1 Encounters:  09/10/11 5\' 8"  (1.727 m)    Wt:   187.2 lb (85.1 kg) Wt Readings from Last 3 Encounters:  09/10/11 188 lb 3.2 oz (85.367 kg)  08/01/11 189 lb (85.73 kg)  07/19/11 190 lb 6.4 oz (86.365 kg)    BMI: 29  27.5%body fat                       Rate Your Plate Score: 58  Please answer the following questions:             YES  NO    Do you live in a nursing home?  X   Do you eat out more than 3 times per week?    X If yes, how many times per week do you eat out?   Do you have food allergies?   X If yes, what are you allergic to?  Have you gained or lost more than  10 lbs without trying?               X If yes, how much weight have you  lost or gained?  lbs over  weeks/mo   Do you want to lose weight?    X  If yes, what is a goal weight or amount of weight you would like to lose? lbs  Do you eat alone most of the time?   X   Do you eat less than 2 meals/day?  X If yes, how many meals do you eat?  Do you use canned and convenience food? X    Do you use a salt shaker?  X   Do you drink more than 3 alcoholic drinks/day?  X If yes, how many drinks per day?  Are you having trouble with constipation? *  X If yes, what are you doing to help relieve constipation?  Do you have financial difficulties with buying food? *  X   Do you usually need help with grocery shopping or with cooking? *  X   Do you have a poor appetite? *                                       X   Do you have trouble chewing/ swallowing? *   X   Do you take vitamin and mineral or  herbal supplements? * X  If yes, what kind of supplements do you currently take?    Past Medical History  Diagnosis Date  . Allergy   . Arthritis   . Diabetes mellitus   . GERD (gastroesophageal reflux disease)   . Hypertension   . Colon polyps 11/23/2010  . Barrett's esophagus 11/23/2010  . Iron deficiency anemia 05/30/2011  . B12 deficiency 05/30/2011  . Abnormal ANCA test 07/03/2011  . CHF (congestive heart failure)    Labs Lipid Panel     Component Value Date/Time   CHOL 124 05/29/2011 0919   TRIG 131.0 05/29/2011 0919   HDL 51.60 05/29/2011 0919   CHOLHDL 2 05/29/2011 0919   VLDL 26.2 05/29/2011 0919   LDLCALC 46 05/29/2011 0919    Lab Results  Component Value Date   HGBA1C 8.2* 05/29/2011    Nutrition Risk Level:  Moderate

## 2011-10-09 NOTE — Progress Notes (Signed)
Completed home exercise with patient. Reviewed exercise progression, routine, exercising at a comfortable pace, RPE/Dyspnea scales, how important it is to own a pulse oximeter and how to use one, weather conditions, warning signs and symptoms with exercise, and CP/NTG. We discussed when to call MD. Patient voices understanding. Patient has a goal to breathe better while doing ADLs. Will continue to encourage and support.  Terry Peck L. Brown, MS, NASM, CES 

## 2011-10-11 ENCOUNTER — Encounter (HOSPITAL_COMMUNITY): Payer: Medicare Other

## 2011-10-16 ENCOUNTER — Encounter (HOSPITAL_COMMUNITY): Payer: Medicare Other

## 2011-10-18 ENCOUNTER — Encounter (HOSPITAL_COMMUNITY): Payer: Medicare Other

## 2011-10-23 ENCOUNTER — Encounter (HOSPITAL_COMMUNITY): Payer: Medicare Other

## 2011-10-25 ENCOUNTER — Encounter (HOSPITAL_COMMUNITY): Payer: Medicare Other

## 2011-10-25 DIAGNOSIS — Z5189 Encounter for other specified aftercare: Secondary | ICD-10-CM | POA: Insufficient documentation

## 2011-10-25 DIAGNOSIS — J8410 Pulmonary fibrosis, unspecified: Secondary | ICD-10-CM | POA: Insufficient documentation

## 2011-10-30 ENCOUNTER — Encounter (HOSPITAL_COMMUNITY)
Admission: RE | Admit: 2011-10-30 | Discharge: 2011-10-30 | Disposition: A | Discharge status: Home / Self Care | Payer: Medicare Other | Source: Ambulatory Visit | Attending: Internal Medicine | Admitting: Internal Medicine

## 2011-11-01 ENCOUNTER — Encounter (HOSPITAL_COMMUNITY): Payer: Medicare Other

## 2011-11-02 ENCOUNTER — Other Ambulatory Visit (INDEPENDENT_AMBULATORY_CARE_PROVIDER_SITE_OTHER): Payer: Medicare Other

## 2011-11-02 DIAGNOSIS — I10 Essential (primary) hypertension: Secondary | ICD-10-CM

## 2011-11-02 DIAGNOSIS — J8410 Pulmonary fibrosis, unspecified: Secondary | ICD-10-CM

## 2011-11-02 DIAGNOSIS — R0989 Other specified symptoms and signs involving the circulatory and respiratory systems: Secondary | ICD-10-CM

## 2011-11-02 DIAGNOSIS — R911 Solitary pulmonary nodule: Secondary | ICD-10-CM

## 2011-11-02 LAB — BASIC METABOLIC PANEL
Calcium: 9.7 mg/dL (ref 8.4–10.5)
Creatinine, Ser: 1.3 mg/dL (ref 0.4–1.5)
GFR: 59.36 mL/min — ABNORMAL LOW (ref >60.00)
Sodium: 140 mEq/L (ref 135–145)

## 2011-11-02 LAB — BRAIN NATRIURETIC PEPTIDE: Pro B Natriuretic peptide (BNP): 36 pg/mL (ref 0.0–100.0)

## 2011-11-06 ENCOUNTER — Ambulatory Visit: Payer: Medicare Other | Admitting: Cardiovascular Disease

## 2011-11-06 ENCOUNTER — Encounter (HOSPITAL_COMMUNITY): Payer: Medicare Other

## 2011-11-08 ENCOUNTER — Encounter (HOSPITAL_COMMUNITY)
Admission: RE | Admit: 2011-11-08 | Discharge: 2011-11-08 | Disposition: A | Discharge status: Home / Self Care | Payer: Medicare Other | Source: Ambulatory Visit | Attending: Internal Medicine | Admitting: Internal Medicine

## 2011-11-13 ENCOUNTER — Encounter (HOSPITAL_COMMUNITY): Payer: Medicare Other

## 2011-11-15 ENCOUNTER — Encounter (HOSPITAL_COMMUNITY): Admission: RE | Admit: 2011-11-15 | Payer: Medicare Other | Source: Ambulatory Visit

## 2011-11-20 ENCOUNTER — Encounter (HOSPITAL_COMMUNITY): Payer: Medicare Other

## 2011-11-22 ENCOUNTER — Encounter (HOSPITAL_COMMUNITY): Payer: Medicare Other

## 2011-11-27 ENCOUNTER — Encounter (HOSPITAL_COMMUNITY): Admission: RE | Admit: 2011-11-27 | Payer: Medicare Other | Source: Ambulatory Visit

## 2011-11-29 ENCOUNTER — Encounter (HOSPITAL_COMMUNITY): Payer: Medicare Other

## 2011-12-04 ENCOUNTER — Encounter (HOSPITAL_COMMUNITY): Payer: Medicare Other

## 2011-12-06 ENCOUNTER — Encounter (HOSPITAL_COMMUNITY): Payer: Medicare Other

## 2011-12-10 ENCOUNTER — Ambulatory Visit (INDEPENDENT_AMBULATORY_CARE_PROVIDER_SITE_OTHER): Payer: Medicare Other | Admitting: Cardiovascular Disease

## 2011-12-10 ENCOUNTER — Encounter: Payer: Self-pay | Admitting: Cardiovascular Disease

## 2011-12-10 VITALS — BP 128/84 | HR 85 | Ht 68.0 in | Wt 183.0 lb

## 2011-12-10 DIAGNOSIS — I5022 Chronic systolic (congestive) heart failure: Secondary | ICD-10-CM

## 2011-12-10 DIAGNOSIS — E785 Hyperlipidemia, unspecified: Secondary | ICD-10-CM

## 2011-12-10 DIAGNOSIS — I509 Heart failure, unspecified: Secondary | ICD-10-CM

## 2011-12-10 NOTE — Patient Instructions (Signed)
Your physician has requested that you have an echocardiogram. Echocardiography is a painless test that uses sound waves to create images of your heart. It provides your doctor with information about the size and shape of your heart and how well your heart's chambers and valves are working. This procedure takes approximately one hour. There are no restrictions for this procedure.   Your physician recommends that you return for a FASTING lipid profile: next week, bmet lipid hepatic  Your physician wants you to follow-up in: 6 months You will receive a reminder letter in the mail two months in advance. If you don't receive a letter, please call our office to schedule the follow-up appointment.

## 2011-12-10 NOTE — Progress Notes (Signed)
[name] Date of Birth  [birth year] [facility]     [place] Office  [address]    [address]   [address] [place], [place]  [address]    [place], [place]  [address] [phone]  Fax  [phone]  [phone]  Fax [phone]  Problem list: 1. Left bundle branch block 2. Diabetes mellitus 3. Hyperlipidemia 4. Hypertension 5. Congestive heart failure-EF equals 30-35%  History of Present Illness:  Damion is a 71 yo who presents for evaluation of dyspnea.  He has been found to have a LBBB and has CHF with an EF of 30-35%.  He gets dyspneac with exercise ( mowing the lawn).  He denies any PND or orthopnea. He describes generalized fatigue with exertion.  The shortness of breath has been present for about a year. He admits to eating a fair amount of salty foods. He eats hot dogs, ham, bacon, sausage. He does not use any exercise but instead uses potassium chloride ( No salt).  He was seen by his medical doctor and was found to have a left bundle branch block. and was referred here for further evaluation. It was at that point that he had an echocardiogram which revealed global left ventricular systolic dysfunction with an EF of 30-35%.  He had a stress Myoview study which reveals an ejection fraction 29%. There is global left ventricular dilatation. There is no evidence of ischemia. His symptoms have improved dramatically since we've stopped his verapamil and start him on carvedilol.  Continue to gradually titrate up his medications. We change his HCTZ to Lasix during his last visit. We also added lisinopril. He's feeling better.  He is able to do more of his normal activities without severe dyspnea.  December 10, 2011 He continues to gradually and steadily improved. He is now able to mow his lawn and edge  without any difficulties. He's doing quite well at cardiac rehabilitation to  Current Outpatient Prescriptions on File Prior to Visit  Medication Sig Dispense  Refill  . Calcium-Magnesium 300-300 MG TABS Take by mouth daily.        . Calcium-Magnesium-Zinc 1000-400-15 MG TABS Take by mouth daily.        . carvedilol (COREG) 6.25 MG tablet take 2 tablets by mouth twice a day  120 tablet  4  . Cholecalciferol (VITAMIN D) 1000 UNITS capsule Take 1,000 Units by mouth daily.        . Cyanocobalamin (VITAMIN B-12 PO) Take 5,000 mg by mouth daily.       . ferrous fumarate (HEMOCYTE - 106 MG FE) 325 (106 FE) MG TABS Take 1 tablet by mouth daily.      . furosemide (LASIX) 40 MG tablet Take 1 tablet (40 mg total) by mouth daily.  30 tablet  5  . Glucose Blood (BAYER [name] 2 TEST) DISK Test four times daily as directed  200 each  11  . hydrochlorothiazide (HYDRODIURIL) 25 MG tablet Take 25 mg by mouth daily.       Marland Kitchen ibuprofen (ADVIL,MOTRIN) 600 MG tablet Take 600 mg by mouth as needed.        Marland Kitchen lisinopril (PRINIVIL,ZESTRIL) 10 MG tablet Take 1 tablet (10 mg total) by mouth daily.  30 tablet  5  . Lutein 20 MG CAPS Take by mouth daily.        . metFORMIN (GLUCOPHAGE) 850 MG tablet Take 1 tablet (850 mg total) by mouth 3 (three) times daily.  90  tablet  11  . methocarbamol (ROBAXIN) 750 MG tablet Take 1 tablet (750 mg total) by mouth as needed.  60 tablet  2  . Multiple Vitamin (MULTIVITAMIN) tablet Take 1 tablet by mouth daily.        . OMEGA 3 1000 MG CAPS Take by mouth.        Marland Kitchen omeprazole (PRILOSEC OTC) 20 MG tablet Take 1 tablet (20 mg total) by mouth 2 (two) times daily.  180 tablet  3  . pioglitazone (ACTOS) 45 MG tablet Take 1 tablet (45 mg total) by mouth at bedtime.  90 tablet  3  . SitaGLIPtin Phosphate (JANUVIA PO) Take by mouth daily.      Marland Kitchen DISCONTD: simvastatin (ZOCOR) 80 MG tablet Take 1 tablet (80 mg total) by mouth at bedtime.  90 tablet  3    Allergies  Allergen Reactions  . Iodine   . Penicillins     Past Medical History  Diagnosis Date  . Allergy   . Arthritis   . Diabetes mellitus   . GERD (gastroesophageal reflux disease)   .  Hypertension   . Colon polyps 11/23/2010  . Barrett's esophagus 11/23/2010  . Iron deficiency anemia 05/30/2011  . B12 deficiency 05/30/2011  . Abnormal ANCA test 07/03/2011  . CHF (congestive heart failure)     Past Surgical History  Procedure Date  . Tonsillectomy 1952    History  Smoking status  . Former Smoker -- 0.5 packs/day for 10 years  . Types: Cigarettes  . Quit date: 04/23/1988  Smokeless tobacco  . Never Used    History  Alcohol Use No    Family History  Problem Relation Age of Onset  . Heart disease Mother   . Colon cancer Neg Hx     Reviw of Systems:  Reviewed in the HPI.  All other systems are negative.  Physical Exam: Blood pressure 128/84, pulse 85, height 5\' 8"  (1.727 m), weight 183 lb (83.008 kg). General: Well developed, well nourished, in no acute distress.  Head: Normocephalic, atraumatic, sclera non-icteric, mucus membranes are moist,   Neck: Supple. Carotids are 2 + without bruits. No JVD  Lungs: clear  Heart: regular rate.  normal  S1 S2. No murmurs, gallops or rubs.  Abdomen: Soft, non-tender, non-distended with normal bowel sounds. No hepatomegaly. No rebound/guarding. No masses.  Msk:  Strength and tone are normal  Extremities: No clubbing or cyanosis. No edema.  Distal pedal pulses are 2+ and equal bilaterally.  Neuro: Alert and oriented X 3. Moves all extremities spontaneously.  Psych:  Responds to questions appropriately with a normal affect.  ECG:  Assessment / Plan:

## 2011-12-10 NOTE — Assessment & Plan Note (Signed)
Terry Peck is doing very well. We'll continue with the same medications. He's made significant improvements over the past several months.  I suspect that his left ventricular function has improved. We'll get an echocardiogram for further assessment of that. If he has not made improvements then will need to consider him for biventricular pacer/ICD.

## 2011-12-11 ENCOUNTER — Encounter (HOSPITAL_COMMUNITY): Payer: Medicare Other

## 2011-12-13 ENCOUNTER — Encounter (HOSPITAL_COMMUNITY): Payer: Medicare Other

## 2011-12-18 ENCOUNTER — Encounter (HOSPITAL_COMMUNITY): Payer: Medicare Other

## 2011-12-19 ENCOUNTER — Other Ambulatory Visit (INDEPENDENT_AMBULATORY_CARE_PROVIDER_SITE_OTHER): Payer: Medicare Other

## 2011-12-19 ENCOUNTER — Ambulatory Visit (HOSPITAL_COMMUNITY): Payer: Medicare Other | Attending: Cardiology

## 2011-12-19 ENCOUNTER — Telehealth: Payer: Self-pay | Admitting: *Deleted

## 2011-12-19 DIAGNOSIS — I079 Rheumatic tricuspid valve disease, unspecified: Secondary | ICD-10-CM | POA: Insufficient documentation

## 2011-12-19 DIAGNOSIS — I059 Rheumatic mitral valve disease, unspecified: Secondary | ICD-10-CM | POA: Insufficient documentation

## 2011-12-19 DIAGNOSIS — Z87891 Personal history of nicotine dependence: Secondary | ICD-10-CM | POA: Insufficient documentation

## 2011-12-19 DIAGNOSIS — I509 Heart failure, unspecified: Secondary | ICD-10-CM

## 2011-12-19 DIAGNOSIS — E785 Hyperlipidemia, unspecified: Secondary | ICD-10-CM

## 2011-12-19 DIAGNOSIS — I10 Essential (primary) hypertension: Secondary | ICD-10-CM | POA: Insufficient documentation

## 2011-12-19 DIAGNOSIS — R9430 Abnormal result of cardiovascular function study, unspecified: Secondary | ICD-10-CM

## 2011-12-19 DIAGNOSIS — E119 Type 2 diabetes mellitus without complications: Secondary | ICD-10-CM | POA: Insufficient documentation

## 2011-12-19 LAB — LIPID PANEL
HDL: 47.8 mg/dL (ref >39.00)
Triglycerides: 143 mg/dL (ref 0.0–149.0)

## 2011-12-19 LAB — BASIC METABOLIC PANEL
CO2: 30 mEq/L (ref 19–32)
Calcium: 9.6 mg/dL (ref 8.4–10.5)
Chloride: 101 mEq/L (ref 96–112)
Creatinine, Ser: 1 mg/dL (ref 0.4–1.5)
Glucose, Bld: 126 mg/dL — ABNORMAL HIGH (ref 70–99)

## 2011-12-19 LAB — HEPATIC FUNCTION PANEL
ALT: 24 U/L (ref 0–53)
Albumin: 3.7 g/dL (ref 3.5–5.2)
Total Protein: 7.2 g/dL (ref 6.0–8.3)

## 2011-12-19 NOTE — Telephone Encounter (Signed)
Message copied by Antony Odea on Wed Dec 19, 2011  5:45 PM ------      Message from: Vesta Mixer      Created: Wed Dec 19, 2011  4:26 PM       His left ventricular systolic function is not any better. Please refer him to electrophysiology for consideration for biventricular pacemaker. Encourage him to eat less salt.

## 2011-12-19 NOTE — Telephone Encounter (Signed)
Pt called with echo and labs, will place referral to ep.

## 2011-12-19 NOTE — Progress Notes (Signed)
Echocardiogram performed.  

## 2011-12-20 ENCOUNTER — Encounter (HOSPITAL_COMMUNITY): Payer: Medicare Other

## 2011-12-21 NOTE — Telephone Encounter (Signed)
Schedule for 01-21-12 @ 10:15 with Dr. Johney Frame.   Patient is aware of the appointment.

## 2011-12-21 NOTE — Telephone Encounter (Signed)
Patient returning call to [facility]

## 2011-12-25 ENCOUNTER — Encounter (HOSPITAL_COMMUNITY): Payer: Medicare Other

## 2011-12-25 ENCOUNTER — Other Ambulatory Visit: Payer: Self-pay | Admitting: Internal Medicine

## 2011-12-27 ENCOUNTER — Encounter (HOSPITAL_COMMUNITY): Payer: Medicare Other

## 2012-01-01 ENCOUNTER — Encounter (HOSPITAL_COMMUNITY): Payer: Medicare Other

## 2012-01-03 ENCOUNTER — Ambulatory Visit (INDEPENDENT_AMBULATORY_CARE_PROVIDER_SITE_OTHER): Payer: Medicare Other | Admitting: Internal Medicine

## 2012-01-03 ENCOUNTER — Encounter: Payer: Self-pay | Admitting: Internal Medicine

## 2012-01-03 ENCOUNTER — Other Ambulatory Visit (INDEPENDENT_AMBULATORY_CARE_PROVIDER_SITE_OTHER): Payer: Medicare Other

## 2012-01-03 ENCOUNTER — Encounter (HOSPITAL_COMMUNITY): Payer: Medicare Other

## 2012-01-03 VITALS — BP 120/80 | HR 78 | Temp 98.3°F | Ht 68.0 in | Wt 183.5 lb

## 2012-01-03 DIAGNOSIS — E538 Deficiency of other specified B group vitamins: Secondary | ICD-10-CM

## 2012-01-03 DIAGNOSIS — D509 Iron deficiency anemia, unspecified: Secondary | ICD-10-CM

## 2012-01-03 DIAGNOSIS — IMO0001 Reserved for inherently not codable concepts without codable children: Secondary | ICD-10-CM

## 2012-01-03 DIAGNOSIS — Z23 Encounter for immunization: Secondary | ICD-10-CM

## 2012-01-03 DIAGNOSIS — Z0000 Encounter for general adult medical examination without abnormal findings: Secondary | ICD-10-CM

## 2012-01-03 DIAGNOSIS — I10 Essential (primary) hypertension: Secondary | ICD-10-CM

## 2012-01-03 LAB — HEMOGLOBIN A1C: Hgb A1c MFr Bld: 7.6 % — ABNORMAL HIGH (ref 4.6–6.5)

## 2012-01-03 LAB — MICROALBUMIN / CREATININE URINE RATIO
Creatinine,U: 41.5 mg/dL
Microalb Creat Ratio: 0.5 mg/g (ref 0.0–30.0)
Microalb, Ur: 0.2 mg/dL (ref 0.0–1.9)

## 2012-01-03 NOTE — Progress Notes (Signed)
Subjective:    Patient ID: Terry Peck, male    DOB: 26-Jan-1941, 71 y.o.   MRN: [id number]  HPI  Here for wellness and f/u;  Overall doing ok;  Pt denies CP, worsening SOB, DOE, wheezing, orthopnea, PND, worsening LE edema, palpitations, dizziness or syncope.  Pt denies neurological change such as new Headache, facial or extremity weakness.  Pt denies polydipsia, polyuria, or low sugar symptoms. Pt states overall good compliance with treatment and medications, good tolerability, and trying to follow lower cholesterol diet.  Pt denies worsening depressive symptoms, suicidal ideation or panic. No fever, night sweats, loss of appetite, or other constitutional symptoms.  Pt states good ability with ADL's, low fall risk, home safety reviewed and adequate, no significant changes in hearing or vision, and occasionally active with exercise.  Also Due for f/u pulm testing today,  Has been involved with pulm rehab until this past mo when he reached the donut hole, and meds and rehab are too expensive.  Has stopped is insulin and lipitor, states thinks he feels better off the insulin.  Does have increased stamina, and decreased DOE, overall happy with his current symptom improvement.  Had been changed from zocor to lipitor per cardiology, but then he read about assoc of lipitor and DM so he stopped it.  CBG's seem to be in the lower 100's in the am, and has been taking the [place] and glyburide instead of the insulin on his own initiative.  Refuses to take statin further at this time.  Overall lost about 6 lbs. Past Medical History  Diagnosis Date  . Allergy   . Arthritis   . Diabetes mellitus   . GERD (gastroesophageal reflux disease)   . Hypertension   . Colon polyps 11/23/2010  . Barrett's esophagus 11/23/2010  . Iron deficiency anemia 05/30/2011  . B12 deficiency 05/30/2011  . Abnormal ANCA test 07/03/2011  . CHF (congestive heart failure)    Past Surgical History  Procedure Date  . Tonsillectomy 1952    reports that he quit smoking about 23 years ago. His smoking use included Cigarettes. He has a 5 pack-year smoking history. He has never used smokeless tobacco. He reports that he does not drink alcohol or use illicit drugs. family history includes Heart disease in his mother.  There is no history of Colon cancer. Allergies  Allergen Reactions  . Iodine   . Penicillins    Current Outpatient Prescriptions on File Prior to Visit  Medication Sig Dispense Refill  . Calcium-Magnesium 300-300 MG TABS Take by mouth daily.        . Calcium-Magnesium-Zinc 1000-400-15 MG TABS Take by mouth daily.        . carvedilol (COREG) 6.25 MG tablet take 2 tablets by mouth twice a day  120 tablet  4  . Cholecalciferol (VITAMIN D) 1000 UNITS capsule Take 1,000 Units by mouth daily.        . Cyanocobalamin (VITAMIN B-12 PO) Take 5,000 mg by mouth daily.       . ferrous fumarate (HEMOCYTE - 106 MG FE) 325 (106 FE) MG TABS Take 1 tablet by mouth daily.      . furosemide (LASIX) 40 MG tablet Take 1 tablet (40 mg total) by mouth daily.  30 tablet  5  . Glucose Blood (BAYER [name] 2 TEST) DISK Test four times daily as directed  200 each  11  . hydrochlorothiazide (HYDRODIURIL) 25 MG tablet Take 25 mg by mouth daily.       Marland Kitchen  ibuprofen (ADVIL,MOTRIN) 600 MG tablet Take 600 mg by mouth as needed.        Marland Kitchen lisinopril (PRINIVIL,ZESTRIL) 10 MG tablet Take 1 tablet (10 mg total) by mouth daily.  30 tablet  5  . Lutein 20 MG CAPS Take by mouth daily.        . metFORMIN (GLUCOPHAGE) 850 MG tablet Take 1 tablet (850 mg total) by mouth 3 (three) times daily.  90 tablet  11  . methocarbamol (ROBAXIN) 750 MG tablet Take 1 tablet (750 mg total) by mouth as needed.  60 tablet  2  . Multiple Vitamin (MULTIVITAMIN) tablet Take 1 tablet by mouth daily.        . OMEGA 3 1000 MG CAPS Take by mouth.        Marland Kitchen omeprazole (PRILOSEC OTC) 20 MG tablet Take 1 tablet (20 mg total) by mouth 2 (two) times daily.  180 tablet  3  . pioglitazone  (ACTOS) 45 MG tablet TAKE ONE TABLET BY MOUTH AT BEDTIME  90 tablet  1  . SitaGLIPtin Phosphate (JANUVIA PO) Take by mouth daily.      Marland Kitchen DISCONTD: simvastatin (ZOCOR) 80 MG tablet Take 1 tablet (80 mg total) by mouth at bedtime.  90 tablet  3   Review of Systems Review of Systems  Constitutional: Negative for diaphoresis, activity change, appetite change and unexpected weight change.  HENT: Negative for hearing loss, ear pain, facial swelling, mouth sores and neck stiffness.   Eyes: Negative for pain, redness and visual disturbance.  Respiratory: Negative for shortness of breath and wheezing.   Cardiovascular: Negative for chest pain and palpitations.  Gastrointestinal: Negative for diarrhea, blood in stool, abdominal distention and rectal pain.  Genitourinary: Negative for hematuria, flank pain and decreased urine volume.  Musculoskeletal: Negative for myalgias and joint swelling.  Skin: Negative for color change and wound.  Neurological: Negative for syncope and numbness.  Hematological: Negative for adenopathy.  Psychiatric/Behavioral: Negative for hallucinations, self-injury, decreased concentration and agitation.      Objective:   Physical Exam BP 120/80  Pulse 78  Temp 98.3 F (36.8 C) (Oral)  Ht 5\' 8"  (1.727 m)  Wt 183 lb 8 oz (83.235 kg)  BMI 27.90 kg/m2  SpO2 95% Physical Exam  VS noted Constitutional: Pt is oriented to person, place, and time. Appears well-developed and well-nourished.  Head: Normocephalic and atraumatic.  Right Ear: External ear normal.  Left Ear: External ear normal.  Nose: Nose normal.  Mouth/Throat: Oropharynx is clear and moist.  Eyes: Conjunctivae and EOM are normal. Pupils are equal, round, and reactive to light.  Neck: Normal range of motion. Neck supple. No JVD present. No tracheal deviation present.  Cardiovascular: Normal rate, regular rhythm, normal heart sounds and intact distal pulses.   Pulmonary/Chest: Effort normal and breath sounds  normal.  Abdominal: Soft. Bowel sounds are normal. There is no tenderness.  Musculoskeletal: Normal range of motion. Exhibits no edema.  Lymphadenopathy:  Has no cervical adenopathy.  Neurological: Pt is alert and oriented to person, place, and time. Pt has normal reflexes. No cranial nerve deficit.  Skin: Skin is warm and dry. No rash noted.  Psychiatric:  . Behavior is ok, 1+ nervous     Assessment & Plan:

## 2012-01-03 NOTE — Patient Instructions (Addendum)
You had the flu shot, and pneumonia shot today You are otherwise up to date with prevention Continue all other medications as before Please have the pharmacy call with any refills you may need. Please go to LAB in the Basement for the blood and/or urine tests to be done today You will be contacted by phone if any changes need to be made immediately.  Otherwise, you will receive a letter about your results with an explanation. Please keep your appointments with your specialists as you have planned - pulm/cardiology Please return in 6 mo with Lab testing done 3-5 days before

## 2012-01-04 ENCOUNTER — Encounter: Payer: Self-pay | Admitting: Internal Medicine

## 2012-01-04 LAB — IBC PANEL: Iron: 51 ug/dL (ref 42–165)

## 2012-01-04 LAB — VITAMIN B12: Vitamin B-12: >1500 pg/mL — ABNORMAL HIGH (ref 211–911)

## 2012-01-05 ENCOUNTER — Encounter: Payer: Self-pay | Admitting: Internal Medicine

## 2012-01-05 NOTE — Assessment & Plan Note (Signed)
For b12 f/u 

## 2012-01-05 NOTE — Assessment & Plan Note (Signed)

## 2012-01-05 NOTE — Assessment & Plan Note (Signed)
stable overall by hx and exam, most recent data reviewed with pt, and pt to continue medical treatment as before Lab Results  Component Value Date   HGBA1C 7.6* 01/03/2012

## 2012-01-05 NOTE — Assessment & Plan Note (Signed)
stable overall by hx and exam, most recent data reviewed with pt, and pt to continue medical treatment as before BP Readings from Last 3 Encounters:  01/03/12 120/80  12/10/11 128/84  09/10/11 120/72

## 2012-01-05 NOTE — Assessment & Plan Note (Signed)
For f/u lab today,  to f/u any worsening symptoms or concerns j

## 2012-01-08 ENCOUNTER — Encounter (HOSPITAL_COMMUNITY): Payer: Medicare Other

## 2012-01-08 ENCOUNTER — Other Ambulatory Visit: Payer: Self-pay | Admitting: Cardiovascular Disease

## 2012-01-09 ENCOUNTER — Encounter: Payer: Self-pay | Admitting: Internal Medicine

## 2012-01-09 ENCOUNTER — Ambulatory Visit (INDEPENDENT_AMBULATORY_CARE_PROVIDER_SITE_OTHER): Payer: Medicare Other | Admitting: Internal Medicine

## 2012-01-09 VITALS — BP 142/68 | HR 76 | Temp 98.4°F | Ht 69.0 in | Wt 184.0 lb

## 2012-01-09 DIAGNOSIS — J8410 Pulmonary fibrosis, unspecified: Secondary | ICD-10-CM

## 2012-01-09 DIAGNOSIS — E663 Overweight: Secondary | ICD-10-CM

## 2012-01-09 DIAGNOSIS — J849 Interstitial pulmonary disease, unspecified: Secondary | ICD-10-CM

## 2012-01-09 DIAGNOSIS — R911 Solitary pulmonary nodule: Secondary | ICD-10-CM

## 2012-01-09 DIAGNOSIS — R0989 Other specified symptoms and signs involving the circulatory and respiratory systems: Secondary | ICD-10-CM

## 2012-01-09 DIAGNOSIS — Z6825 Body mass index (BMI) 25.0-25.9, adult: Secondary | ICD-10-CM

## 2012-01-09 LAB — PULMONARY FUNCTION TEST

## 2012-01-09 NOTE — Telephone Encounter (Signed)
Fax Received. Refill Completed. Rameses Ou Chowoe (R.M.A)   

## 2012-01-09 NOTE — Progress Notes (Signed)
PFT done today. 

## 2012-01-09 NOTE — Progress Notes (Signed)
Subjective:    Patient ID: Terry Peck, male    DOB: Mar 09, 1941, 71 y.o.   MRN: [id number]  HPI IOV 06/20/2011 . PMD is Dr Oliver Barre. REferred for lung nodule and dyspnea  71year old male.  reports that he quit smoking about 23 years ago. His smoking use included Cigarettes. He has a 5 pack-year smoking history (1/2 pack per day x 10 years). He has never used smokeless tobacco. Body mass index is 28.15 kg/(m^2). Known DM with nocturia - started on insulin 06/20/2011 , BP, Arthritis, Barretts - seeing Dr Christella Hartigan  Repoirts hx of small lung nodule with serial CT fu at [facility] for 2 years at [facility] ending 5 years ago. STable and dc from followup. Does not remeemer which side  Reports insidious onset of dyspnea on exertion x 1  Year. Progressive gradually. Improved by rest. Notices when he mows yard, walking briskly one flight of stairs notices dyspnea.Always improved by rest. Wife notices he is not able to keep up with her during walks outside the home. No associated weight gain, or admissions to hospitals, no new medical problems diagnosed since onset of symptoms. Denies associated chest pain, cough, fever, weight gain, hemoptysis, edema, orthopnea, paroxysmal nocturnal dyspnea, wheezing. Systolic function was moderately to severely reduced.   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 Has active GERD +. Denies collagen vascular disease. No steel dust, metal dust or asbestos exposure. Denies amio intake. Denies chemo. Does not have pet birds or feathery pillows  ECHO - 05/31/11: ejection fraction was in the range of 30% to 35%. There is hypokinesis of the inferior and inferoseptal myocardium. Doppler parameters are consistent with abnormal left ventricular relaxation (grade 1 diastolic Dysfunction). - Ventricular septum: These changes are consistent with a left bundle branch block. - Mitral valve: Mildly calcified annulus. Mildly  thickenedleaflets . - Pulmonary arteries: Systolic pressure was mildly increased. PA peak pressure: 37mm Hg (S).  Ct Chest Wo Contrast  05/31/2011  *RT CHEST WITHOUT CONTRAST  Technique:   Findings:  No enlarged axillary or supraclavicular lymph nodes.  There is no enlarged mediastinal or hilar lymph nodes.  No pericardial or pleural effusion identified.  There is a patulous and fluid-filled thoracic esophagus.  There is peripheral and basilar predominant interstitial reticulation.  Bibasilar subpleural honeycombing is also identified.  Mild bronchiectasis is present in the lung bases.  Ground-glass attenuation nodular density is noted within the left upper lobe measuring 1.8 cm, image 29.  Solid nodule within left lower lobe measures 6 mm.  Review of the visualized osseous structures is unremarkable.  Limited imaging through the upper abdomen is significant for a left renal cyst.  Only partially visualized and incompletely characterized without IV contrast.  IMPRESSION:  1.  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 2.  Ground-glass attenuating nodule within the left upper lobe is nonspecific and may be related to interstitial lung disease.  Low grade pulmonary adenocarcinoma may have a similar appearance. Follow-up imaging in 3 months is recommended to ensure stability.    PFT 06/06/11:Signs of restrictions c/w the ILD we see on CT present even though TLC 5L/81%  is normal. This is because FVC 2.9L/69% and reduced (fev1 is 2.45L/86% and normal). ratip is 84 and normal. DLCO is reduced 11/50%  REC  #lung nodule  - please sign release for [facility] to send CD rom of CT chest  - likely not cancer but  if we cannot get [facility] records we have to follow this iwht CT in 9 months  #Pulmonary fibrosis  - do not know why but acid reflux could have played a role  - please Serum: ESR, ANA, DS-DNA, RF, anti-CCP, ssA, ssB, scl-70, ANCA, Total CK, Aldolase, Hypersensitivity Pneumonitis  Panel, ACE level  - will call you with results to decide next step  #Shortness of breath  - both due to heart and lung issues  - between the two the heart is more important priority - please see cardiology ASAP  - our Nurse will ensure you have an appointment next few days  #Followup  - based on blood test; probably 1 month   OV 07/19/2011  Review dyspnea related to ILD NOS and new chronic systolic CHF. Since last visit has seen cardiology Dr Elease Hashimoto. States that with cards med mgmt: dyspnea improved a "little bit". Able to do more. Can now climb 2 flights of stairs non-stop (last visit only 1 flight of stairs). 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 Dilated left ventricle with global hypokinesis. Suspect small mild area of reversible ischemia in mid anterior wall. The degree of severe LV dysfunction is out of proportion to the mild ischemia noted. LV Ejection Fraction: 29%. LV Wall Motion: Severe global hypokinesis).  Meds adjusted. Cath on hold. Plan to place pacer if no improvement in EF with med mgmt. In temrs of workup for pulmonary fibrosis:    - 06/20/11 auto-immune test result: all negative including HP panel except anca is trace positive at 1:20 which is the cut off between negative and positive. ESR 29  Past, Family, Social reviewed: no change since last visit except as noted in HPI    #lung nodule  - there are 2 spots in lung and there is possibiloty this could be lung cancer  - we absolultey need the scan fom [facility]; not just report but the CD rom of CT chest  - see if you can get it to [place] next 2 weeks if not repeat ct chest by Aug 28, 2011 (3rd months CT)  #Pulmonary fibrosis  - do not know why but acid reflux could have played a role  - at this point we will follow this - you need breathing test in 6 months or so .  - we will also use the CT chest results for the nodule to follow the pulmonary fibrosis  #Shortness of  breath  - both due to heart and lung issues  - between the two the heart is more important priority - Follow Dr Elease Hashimoto advise  - I will check with your cardiologist about suitability to attend pulmonary rehab at [facility]  #Followup  - based on your call about the CT chest from [facility]  OV 09/10/2011  Followup pulmonary nodule, ILD non-UIP pattern NOS and dyspnea due to ILD and Systolic CHF   - Last visit was 2 months ago. In terms of nodule, we have been unable to get the actual cd rom from [facility]. So, he will need fu testing here. In terms of pulm fibrosis, Bx  Plans placed on hold due to cardiac status at time of last ov. I learn from 08/01/11 Dr Elease Hashimoto cards notes that plan is for continued medical mgmt. His dyspnea is improved a lot due to great med mgmt and bnp 80s as of 08/01/11. He is able to mow yard now. He thinks dietary portion control  and resultant weight loss has also helped with dyspnea   Past, Family, Social reviewed: no change since last visit  #lung nodule  - there are 2 spots in lung and there is possibiloty this could be lung cancer  - because we could not get scan from [facility], please do CT chest wihtout contrast now  #Pulmonary fibrosis  - do not know why but acid reflux could have played a role  - repeat anca blood test because this was trace positive and will help [place] know if this played a role  - depending on findings on CT chest, might have to consider lung biopsy  - you need breathing test in 6 months or so .  #Shortness of breath  - both due to heart and lung issues; glad you are beter  - between the two the heart is more important priority - Follow Dr Elease Hashimoto advise  - Dr Elease Hashimoto gave okay and will refer you to pulmonary rehab  #Overweight  - follow [facility] Diet sheet I discussed with you; stick to foods in left lane  #Followup  - based on results of blood test and ct chest; if they are okay, I will see you in 3 months with breathing test      Phne call  09/13/11  Called patient with anca blood test result - got him on work phone   - ANCA blood test repeat result negative  - CT scan   - lung nodule report - nodule smaller than before  - scarring or fibrosis in lung - stable as before   Therefore, recommendation  - follow weight loss wit  [facility] diet sheet  - do rehab (already done)   OV 01/09/2012  Dyspnea: Did 12 visits with rehab that helped immensely with dyspnea. Then $ issues. Walking at home non-stop at home with wife with o2 holding. PFT 01/09/12 - fvc 3.11/74%, fev1 2.6L/93% ratipo 84, TLC 73%, DLCO 11/53%. Of note, the FVC is significantly better than feb 2013 (? Weight loss effect) but dlco is same. In terms of weight: lost 9# on [facility] low glycemic diet (6# since M[facility] 2013) . Tolerating diet well. No headaches. No hunger pangs. GERD resolved. Soft stools ++. Overall this makes him feel better despite fact echo 12/19/11 is unchangd with LOW Systolic EF 35% and elevated PASP Hg  Past, Family, Social reviewed: no change since last visit except his EF conitnues to be low and he has been referred for pacemaker        Review of Systems  Constitutional: Negative for fever and unexpected weight change.  HENT: Negative for ear pain, nosebleeds, congestion, sore throat, rhinorrhea, sneezing, trouble swallowing, dental problem, postnasal drip and sinus pressure.   Eyes: Negative for redness and itching.  Respiratory: Negative for cough, chest tightness, shortness of breath and wheezing.   Cardiovascular: Negative for palpitations and leg swelling.  Gastrointestinal: Negative for nausea and vomiting.  Genitourinary: Negative for dysuria.  Musculoskeletal: Negative for joint swelling.  Skin: Negative for rash.  Neurological: Negative for headaches.  Hematological: Does not bruise/bleed easily.  Psychiatric/Behavioral: Negative for dysphoric mood. The patient is not nervous/anxious.        Objective:   Physical  Exam Nursing note and vitals reviewed. Constitutional: He is oriented to person, place, and time. He appears well-developed and well-nourished. No distress.  HENT:  Head: Normocephalic and atraumatic.  Right Ear: External ear normal.  Left Ear: External ear normal.  Mouth/Throat: Oropharynx is clear and  moist. No oropharyngeal exudate.  Eyes: Conjunctivae and EOM are normal. Pupils are equal, round, and reactive to light. Right eye exhibits no discharge. Left eye exhibits no discharge. No scleral icterus.  Neck: Normal range of motion. Neck supple. No JVD present. No tracheal deviation present. No thyromegaly present.  Cardiovascular: Normal rate, regular rhythm and intact distal pulses.  Exam reveals no gallop and no friction rub.   No murmur heard. Pulmonary/Chest: Effort normal. No respiratory distress. He has no wheezes. He has rales. He exhibits no tenderness.       At the very base  Abdominal: Soft. Bowel sounds are normal. He exhibits no distension and no mass. There is no tenderness. There is no rebound and no guarding.  Musculoskeletal: Normal range of motion. He exhibits no edema and no tenderness.  Lymphadenopathy:    He has no cervical adenopathy.  Neurological: He is alert and oriented to person, place, and time. He has normal reflexes. No cranial nerve deficit. Coordination normal.  Skin: Skin is warm and dry. No rash noted. He is not diaphoretic. No erythema. No pallor.  Psychiatric: He has a normal mood and affect. His behavior is normal. Judgment and thought content normal.          Assessment & Plan:

## 2012-01-09 NOTE — Patient Instructions (Addendum)
#  weight   - continue low glycemic diet  - as you continue to lose weight, talk to pcp Oliver Barre, MD about slowly coming off diabetic drugs  #lung nodule  - next ct chest is May 2014  #Lung fibrosis and shortness of breath  - glad shortness of breath better  - contnue exercise and diet - glad you had flu shot - will hold off lung biopsy conversation due to risk of bx with EF 35%; atleast till EP eval complete or there is evidence of progression on PFT  #Followup  May 2014 or sooner if needed

## 2012-01-10 ENCOUNTER — Encounter (HOSPITAL_COMMUNITY): Payer: Medicare Other

## 2012-01-11 ENCOUNTER — Telehealth: Payer: Self-pay | Admitting: Internal Medicine

## 2012-01-11 ENCOUNTER — Encounter: Payer: Self-pay | Admitting: Internal Medicine

## 2012-01-11 MED ORDER — GLIPIZIDE ER 10 MG PO TB24: ORAL_TABLET | ORAL | Status: DC

## 2012-01-11 NOTE — Telephone Encounter (Signed)
-----   Message from [name] to Corwin Levins, MD sent at 01/11/2012 12:28 PM ----- I noticed that my Rx for gly-buride 5mg , 4 pills per day is not on my Rx list. I now have 4 pills left and I seem to be having trouble getting it refilled. I am using the [facility] pharmacy on [address] for this Rx. Could you please see that this Rx is added to my list and OK'ed for refill ASAP. Thanks Valentino Saxon for med as per pt email request, but instead of glyburide 5 mg qid as he has apparently been taking, a safer and more convenient rx would be glipizide 10 mg ER - 2 in the AM - done per erx

## 2012-01-13 NOTE — Assessment & Plan Note (Signed)
#  weight   - continue low glycemic diet  - as you continue to lose weight, talk to pcp Oliver Barre, MD about slowly coming off diabetic drugs

## 2012-01-13 NOTE — Assessment & Plan Note (Signed)
 #  Lung fibrosis and shortness of breath  - glad shortness of breath better  - contnue exercise and diet - glad you had flu shot - will hold off lung biopsy conversation due to risk of bx with EF 35%; atleast till EP eval complete or there is evidence of progression on PFT  #Followup  May 2014 or sooner if needed

## 2012-01-13 NOTE — Assessment & Plan Note (Signed)
 #  lung nodule  - next ct chest is May 2014

## 2012-01-17 ENCOUNTER — Other Ambulatory Visit: Payer: Self-pay | Admitting: Cardiovascular Disease

## 2012-01-17 NOTE — Telephone Encounter (Signed)
Fax Received. Refill Completed. Terry Peck (R.M.A)   

## 2012-01-21 ENCOUNTER — Encounter: Payer: Self-pay | Admitting: *Deleted

## 2012-01-21 ENCOUNTER — Ambulatory Visit (INDEPENDENT_AMBULATORY_CARE_PROVIDER_SITE_OTHER): Payer: Medicare Other | Admitting: Internal Medicine

## 2012-01-21 ENCOUNTER — Telehealth (HOSPITAL_COMMUNITY): Payer: Self-pay | Admitting: *Deleted

## 2012-01-21 ENCOUNTER — Encounter: Payer: Self-pay | Admitting: Internal Medicine

## 2012-01-21 VITALS — BP 134/72 | HR 72 | Ht 68.0 in | Wt 185.0 lb

## 2012-01-21 DIAGNOSIS — I5022 Chronic systolic (congestive) heart failure: Secondary | ICD-10-CM

## 2012-01-21 DIAGNOSIS — I10 Essential (primary) hypertension: Secondary | ICD-10-CM

## 2012-01-21 DIAGNOSIS — I509 Heart failure, unspecified: Secondary | ICD-10-CM

## 2012-01-21 DIAGNOSIS — I447 Left bundle-branch block, unspecified: Secondary | ICD-10-CM

## 2012-01-21 DIAGNOSIS — Z01812 Encounter for preprocedural laboratory examination: Secondary | ICD-10-CM

## 2012-01-21 LAB — CBC WITH DIFFERENTIAL/PLATELET
Basophils Relative: 1.1 % (ref 0.0–3.0)
Eosinophils Absolute: 0.2 10*3/uL (ref 0.0–0.7)
Eosinophils Relative: 3.1 % (ref 0.0–5.0)
Hemoglobin: 13.6 g/dL (ref 13.0–17.0)
Lymphocytes Relative: 34.7 % (ref 12.0–46.0)
MCHC: 32.6 g/dL (ref 30.0–36.0)
Monocytes Relative: 10 % (ref 3.0–12.0)
Neutro Abs: 4.1 10*3/uL (ref 1.4–7.7)
RBC: 4.53 Mil/uL (ref 4.22–5.81)

## 2012-01-21 LAB — BASIC METABOLIC PANEL
CO2: 27 mEq/L (ref 19–32)
Calcium: 9.6 mg/dL (ref 8.4–10.5)
Creatinine, Ser: 1.1 mg/dL (ref 0.4–1.5)
GFR: 67.2 mL/min (ref >60.00)
Sodium: 135 mEq/L (ref 135–145)

## 2012-01-21 NOTE — Assessment & Plan Note (Signed)
As above.

## 2012-01-21 NOTE — Assessment & Plan Note (Signed)
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 At this time, he meets MADIT II/ SCD-HeFT criteria for ICD implantation for primary prevention of sudden death.  He has been treated with an optimal medical regimen but continues to have a depressed EF.  Given LBBB with QRS >181msec, he is expected to possibly benefit from CRT also (Class I recommendation).  I have spoken at length this am with Dr Colletta Maryland who knows that patient well.  He feels that the pulmonary nodule is shrinking and is not likely cancer.  He also feels that pulmonary fibrosis is not likely to advance.  No further pulmonary workup is felt to be warranted at this time.  He feels that the patient is a good candidate for BiV ICD with a good long term prognosis from a pulmonary standpoint.   Risks, benefits, alternatives to BiVICD implantation were discussed in detail with the patient today. The patient  understands that the risks include but are not limited to bleeding, infection, pneumothorax, perforation, tamponade, vascular damage, renal failure, MI, stroke, death, inappropriate shocks, and lead dislodgement and wishes to proceed.  We will therefore schedule device implantation at the next available time.

## 2012-01-21 NOTE — Assessment & Plan Note (Signed)
Controlled.  As above.

## 2012-01-21 NOTE — Progress Notes (Signed)
 Primary Care Physician: Kahli Mayon John, MD Referring Physician:  [name]   Terry Peck is a 71 y.o. 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  He reports that he initially presented 2/13 to [name]'s office with shortness of breath.  He was found to have pulmonary fibrosis as well as a depressed ejection fraction.  He was evaluated by [name] and underwent stress testing which revealed EF 29% with global HK and mild ischemia for which medical management was recommended.  He was also found to have a LBBB.  He was placed on a medical regimen for his CHF.  He reports significant improvement in symptoms of fatigue and decreased exercise tolerance.  Despite optimal medical therapy, follow-up echo 8/13 reveals that his EF remains depressed. He has been evaluated by Dr Ramaswami and felt to have a small and shrinking pulmonary nodule as well as early pulmonary fibrosis for which pulmonary rehab was advised.  He underwent pulmonary rehab and continues to improve.  Per pt, it is felt that pulmonary fibrosis may be related to reflux.   Presently he can walk up to 2 miles but has to walk slowly to do so.   Today, he denies symptoms of palpitations, chest pain, orthopnea, PND, lower extremity edema, dizziness, presyncope, syncope, or neurologic sequela. The patient is tolerating medications without difficulties and is otherwise without complaint today.   Past Medical History  Diagnosis Date  . Allergy   . Arthritis   . Diabetes mellitus   . GERD (gastroesophageal reflux disease)   . Hypertension   . Colon polyps 11/23/2010  . Barrett's esophagus 11/23/2010  . Iron deficiency anemia 05/30/2011  . B12 deficiency 05/30/2011  . Abnormal ANCA test 07/03/2011  . Chronic systolic dysfunction of left ventricle   . Pulmonary fibrosis   . Pulmonary nodules    Past Surgical History  Procedure Date  . Tonsillectomy 1952     Current Outpatient Prescriptions  Medication Sig Dispense Refill  . Calcium-Magnesium 300-300 MG TABS Take by mouth daily.        . Calcium-Magnesium-Zinc 1000-400-15 MG TABS Take by mouth daily.        . carvedilol (COREG) 6.25 MG tablet take 2 tablets by mouth twice a day  120 tablet  4  . Cholecalciferol (VITAMIN D) 1000 UNITS capsule Take 1,000 Units by mouth daily.        . Cyanocobalamin (VITAMIN B-12 PO) Take 5,000 mg by mouth daily.       . ferrous fumarate (HEMOCYTE - 106 MG FE) 325 (106 FE) MG TABS Take 1 tablet by mouth daily.      . furosemide (LASIX) 40 MG tablet take 1 tablet by mouth once daily  30 tablet  5  . glipiZIDE (GLUCOTROL XL) 10 MG 24 hr tablet 2 tabs by mouth in the AM  180 tablet  3  . Glucose Blood (BAYER [name] 2 TEST) DISK Test four times daily as directed  200 each  11  . hydrochlorothiazide (HYDRODIURIL) 25 MG tablet Take 25 mg by mouth daily.       . ibuprofen (ADVIL,MOTRIN) 600 MG tablet Take 600 mg by mouth as needed.        . lisinopril (PRINIVIL,ZESTRIL) 10 MG tablet take 1 tablet by mouth once daily  30 tablet  5  . Lutein 20 MG CAPS Take by mouth daily.        .   metFORMIN (GLUCOPHAGE) 850 MG tablet Take 1 tablet (850 mg total) by mouth 3 (three) times daily.  90 tablet  11  . methocarbamol (ROBAXIN) 750 MG tablet Take 1 tablet (750 mg total) by mouth as needed.  60 tablet  2  . Multiple Vitamin (MULTIVITAMIN) tablet Take 1 tablet by mouth daily.        . OMEGA 3 1000 MG CAPS Take by mouth.        . omeprazole (PRILOSEC OTC) 20 MG tablet Take 1 tablet (20 mg total) by mouth 2 (two) times daily.  180 tablet  3  . pioglitazone (ACTOS) 45 MG tablet TAKE ONE TABLET BY MOUTH AT BEDTIME  90 tablet  1  . SitaGLIPtin Phosphate (JANUVIA PO) Take by mouth daily.      . DISCONTD: simvastatin (ZOCOR) 80 MG tablet Take 1 tablet (80 mg total) by mouth at bedtime.  90 tablet  3    Allergies  Allergen Reactions  . Iodine   . Penicillins   He reports low grade  fever with dye 25 years ago, which was attributed to iodine.  He does not think that he has had any trouble with non iodinated contrast.  History   Social History  . Marital Status: Married    Spouse Name: N/A    Number of Children: 2  . Years of Education: 16   Occupational History  . 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 Smoking status: Former Smoker -- 0.5 packs/day for 10 years    Types: Cigarettes    Quit date: 04/23/1988  . Smokeless tobacco: Never Used  . Alcohol Use: No  . Drug Use: No  . Sexually Active: Not on file   Other Topics Concern  . Not on file   Social History Narrative   Lives in [place] with spouse.  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  Now works parttime for [employer].      Family History  Problem Relation Age of Onset  . Heart disease Mother   . Colon cancer Neg Hx     ROS- All systems are reviewed and negative except as per the HPI above  Physical Exam: Filed Vitals:   01/21/12 1033  BP: 134/72  Pulse: 72  Height: 5' 8" (1.727 m)  Weight: 185 lb (83.915 kg)  SpO2: 99%    GEN- The patient is well appearing, alert and oriented x 3 today.   Head- normocephalic, atraumatic Eyes-  Sclera clear, conjunctiva pink Ears- hearing intact Oropharynx- clear Neck- supple, no JVP Lymph- no cervical lymphadenopathy Lungs- dry rales at the bases, normal work of breathing Heart- Regular rate and rhythm, no murmurs, rubs or gallops, PMI not laterally displaced GI- soft, NT, ND, + BS Extremities- no clubbing, cyanosis, or edema MS- no significant deformity or atrophy Skin- no rash or lesion Psych- euthymic mood, full affect Neuro- strength and sensation are intact  EKG today reveals sinus rhythm 72 bpm, LBBB (QRS 166msec) 12/19/11- LVEF 30%, global HK, mild MR  Myoview 07/04/11- EF 29%, no significant ischemia   Assessment and Plan:  

## 2012-01-21 NOTE — Telephone Encounter (Signed)
Call to patient regarding non attendance.  Patient states due to insurance co-pays and he cannot afford at this time.  We agreed to discharge and readmit later if he wishes to return

## 2012-01-21 NOTE — Patient Instructions (Addendum)

## 2012-01-22 ENCOUNTER — Encounter (HOSPITAL_COMMUNITY): Payer: Self-pay | Admitting: Pharmacist

## 2012-01-22 NOTE — Progress Notes (Signed)
PULMONARY REHAB OUTCOMES REPORT  Pt completed 5 sessions of pulmonary rehab. Patient called to let us know due to insurance being in the doughnut hole that he was unable to return. He could not pay for the program so for the patients interest RN spoke with patient and asked to try to come back at the first of the year. Patient did not complete enough consistent sessions to really understand the benefit of pulmonary rehab or reach any goals. With only 5 sessions completed pt was not able to show [place] any changes or improvement. However, patient did turn in a pre quality of life score that showed patient had a great quality of life outside of rehab. Patient completed a 6 minute walk test and showed sats greater that 90% on RA walking a distance of 1021ft. All exercise details will be scanned in.  Terry Peck L. Manson Passey, MS, NASM, CES

## 2012-01-25 ENCOUNTER — Telehealth: Payer: Self-pay | Admitting: Gastroenterology

## 2012-01-25 NOTE — Telephone Encounter (Signed)
Received 14 pages from [facility], sent to Dr, Christella Hartigan. 01/25/12/sd

## 2012-01-28 ENCOUNTER — Telehealth: Payer: Self-pay | Admitting: Internal Medicine

## 2012-01-28 MED ORDER — SODIUM CHLORIDE 0.9 % IR SOLN
80.0000 mg | Status: DC
  Filled 2012-01-28: qty 2

## 2012-01-28 MED ORDER — VANCOMYCIN HCL IN DEXTROSE 1-5 GM/200ML-% IV SOLN
1000.0000 mg | INTRAVENOUS | Status: DC
  Filled 2012-01-28 (×2): qty 200

## 2012-01-28 MED ORDER — SODIUM CHLORIDE 0.9 % IV SOLN: 250.0000 mL | INTRAVENOUS | Status: DC

## 2012-01-28 MED ORDER — SODIUM CHLORIDE 0.9 % IJ SOLN: 3.0000 mL | Freq: Two times a day (BID) | INTRAMUSCULAR | Status: DC

## 2012-01-28 MED ORDER — CHLORHEXIDINE GLUCONATE 4 % EX LIQD
60.0000 mL | Freq: Once | CUTANEOUS | Status: DC
  Filled 2012-01-28: qty 60

## 2012-01-28 MED ORDER — SODIUM CHLORIDE 0.45 % IV SOLN
INTRAVENOUS | Status: DC
  Administered 2012-01-29: 11:00:00 via INTRAVENOUS

## 2012-01-28 MED ORDER — SODIUM CHLORIDE 0.9 % IJ SOLN: 3.0000 mL | INTRAMUSCULAR | Status: DC | PRN

## 2012-01-28 NOTE — Telephone Encounter (Signed)
He will get an [facility] tomorrow after procedure before he is discharged  Patient is aware

## 2012-01-28 NOTE — Telephone Encounter (Signed)
New message:  Having procedure tomorrow and understood he should have a PHV scheduled which he does not.  Please call and advise.

## 2012-01-29 ENCOUNTER — Encounter (HOSPITAL_COMMUNITY)
Admission: RE | Disposition: A | Discharge status: Home / Self Care | Payer: Self-pay | Source: Ambulatory Visit | Attending: Internal Medicine

## 2012-01-29 ENCOUNTER — Encounter (HOSPITAL_COMMUNITY): Payer: Self-pay | Admitting: General Practice

## 2012-01-29 ENCOUNTER — Ambulatory Visit (HOSPITAL_COMMUNITY)
Admission: RE | Admit: 2012-01-29 | Discharge: 2012-01-30 | Disposition: A | Discharge status: Home / Self Care | Payer: Medicare Other | Source: Ambulatory Visit | Attending: Internal Medicine | Admitting: Internal Medicine

## 2012-01-29 DIAGNOSIS — I509 Heart failure, unspecified: Secondary | ICD-10-CM | POA: Insufficient documentation

## 2012-01-29 DIAGNOSIS — I428 Other cardiomyopathies: Secondary | ICD-10-CM | POA: Insufficient documentation

## 2012-01-29 DIAGNOSIS — K219 Gastro-esophageal reflux disease without esophagitis: Secondary | ICD-10-CM | POA: Insufficient documentation

## 2012-01-29 DIAGNOSIS — I5022 Chronic systolic (congestive) heart failure: Secondary | ICD-10-CM | POA: Insufficient documentation

## 2012-01-29 DIAGNOSIS — I10 Essential (primary) hypertension: Secondary | ICD-10-CM | POA: Insufficient documentation

## 2012-01-29 DIAGNOSIS — Z01812 Encounter for preprocedural laboratory examination: Secondary | ICD-10-CM

## 2012-01-29 DIAGNOSIS — E119 Type 2 diabetes mellitus without complications: Secondary | ICD-10-CM | POA: Insufficient documentation

## 2012-01-29 DIAGNOSIS — I447 Left bundle-branch block, unspecified: Secondary | ICD-10-CM | POA: Insufficient documentation

## 2012-01-29 HISTORY — DX: Vitamin B12 deficiency anemia, unspecified: D51.9

## 2012-01-29 HISTORY — DX: Presence of automatic (implantable) cardiac defibrillator: Z95.810

## 2012-01-29 HISTORY — PX: CARDIAC DEFIBRILLATOR PLACEMENT: SHX171

## 2012-01-29 HISTORY — DX: Other cardiomyopathies: I42.8

## 2012-01-29 HISTORY — DX: Left bundle-branch block, unspecified: I44.7

## 2012-01-29 HISTORY — PX: BI-VENTRICULAR IMPLANTABLE CARDIOVERTER DEFIBRILLATOR: SHX5459

## 2012-01-29 HISTORY — DX: Abscess of liver: K75.0

## 2012-01-29 HISTORY — DX: Shortness of breath: R06.02

## 2012-01-29 HISTORY — DX: Type 2 diabetes mellitus without complications: E11.9

## 2012-01-29 LAB — GLUCOSE, CAPILLARY
Glucose-Capillary: 167 mg/dL — ABNORMAL HIGH (ref 70–99)
Glucose-Capillary: 138 mg/dL — ABNORMAL HIGH (ref 70–99)
Glucose-Capillary: 252 mg/dL — ABNORMAL HIGH (ref 70–99)

## 2012-01-29 LAB — SURGICAL PCR SCREEN: MRSA, PCR: POSITIVE — AB

## 2012-01-29 SURGERY — BI-VENTRICULAR IMPLANTABLE CARDIOVERTER DEFIBRILLATOR  (CRT-D)
Anesthesia: LOCAL

## 2012-01-29 MED ORDER — YOU HAVE A PACEMAKER BOOK
Freq: Once | Status: AC
  Administered 2012-01-29: 22:00:00
  Filled 2012-01-29: qty 1

## 2012-01-29 MED ORDER — LINAGLIPTIN 5 MG PO TABS
5.0000 mg | ORAL_TABLET | Freq: Every day | ORAL | Status: DC
  Administered 2012-01-30: 5 mg via ORAL
  Filled 2012-01-29: qty 1

## 2012-01-29 MED ORDER — GLIPIZIDE ER 10 MG PO TB24
20.0000 mg | ORAL_TABLET | Freq: Every day | ORAL | Status: DC
  Administered 2012-01-30: 10:00:00 10 mg via ORAL
  Filled 2012-01-29 (×2): qty 2

## 2012-01-29 MED ORDER — ONDANSETRON HCL 4 MG/2ML IJ SOLN: 4.0000 mg | Freq: Four times a day (QID) | INTRAMUSCULAR | Status: DC | PRN

## 2012-01-29 MED ORDER — MIDAZOLAM HCL 5 MG/5ML IJ SOLN
INTRAMUSCULAR | Status: AC
  Filled 2012-01-29: qty 5

## 2012-01-29 MED ORDER — CHLORHEXIDINE GLUCONATE CLOTH 2 % EX PADS: 6.0000 | MEDICATED_PAD | Freq: Every day | CUTANEOUS | Status: DC

## 2012-01-29 MED ORDER — HEPARIN (PORCINE) IN NACL 2-0.9 UNIT/ML-% IJ SOLN
INTRAMUSCULAR | Status: AC
  Filled 2012-01-29: qty 500

## 2012-01-29 MED ORDER — FENTANYL CITRATE 0.05 MG/ML IJ SOLN
INTRAMUSCULAR | Status: AC
  Filled 2012-01-29: qty 2

## 2012-01-29 MED ORDER — VANCOMYCIN HCL IN DEXTROSE 1-5 GM/200ML-% IV SOLN
1000.0000 mg | Freq: Two times a day (BID) | INTRAVENOUS | Status: AC
  Administered 2012-01-29: 1000 mg via INTRAVENOUS
  Filled 2012-01-29: qty 200

## 2012-01-29 MED ORDER — MUPIROCIN 2 % EX OINT
TOPICAL_OINTMENT | Freq: Two times a day (BID) | CUTANEOUS | Status: DC
  Administered 2012-01-29: 11:00:00 via NASAL
  Filled 2012-01-29: qty 22

## 2012-01-29 MED ORDER — SODIUM CHLORIDE 0.9 % IJ SOLN: 3.0000 mL | INTRAMUSCULAR | Status: DC | PRN

## 2012-01-29 MED ORDER — CHLORHEXIDINE GLUCONATE CLOTH 2 % EX PADS
6.0000 | MEDICATED_PAD | Freq: Every day | CUTANEOUS | Status: DC
  Administered 2012-01-29: 20:00:00 6 via TOPICAL

## 2012-01-29 MED ORDER — LIDOCAINE HCL (PF) 1 % IJ SOLN
INTRAMUSCULAR | Status: AC
  Filled 2012-01-29: qty 60

## 2012-01-29 MED ORDER — PIOGLITAZONE HCL 45 MG PO TABS
45.0000 mg | ORAL_TABLET | Freq: Every day | ORAL | Status: DC
  Administered 2012-01-29: 23:00:00 45 mg via ORAL
  Filled 2012-01-29 (×2): qty 1

## 2012-01-29 MED ORDER — ASPIRIN EC 81 MG PO TBEC
81.0000 mg | DELAYED_RELEASE_TABLET | Freq: Every day | ORAL | Status: DC
  Administered 2012-01-29 – 2012-01-30 (×2): 81 mg via ORAL
  Filled 2012-01-29 (×2): qty 1

## 2012-01-29 MED ORDER — ACETAMINOPHEN 325 MG PO TABS
325.0000 mg | ORAL_TABLET | ORAL | Status: DC | PRN
  Administered 2012-01-29: 650 mg via ORAL
  Filled 2012-01-29: qty 2

## 2012-01-29 MED ORDER — CARVEDILOL 12.5 MG PO TABS
12.5000 mg | ORAL_TABLET | Freq: Two times a day (BID) | ORAL | Status: DC
  Administered 2012-01-29 – 2012-01-30 (×2): 12.5 mg via ORAL
  Filled 2012-01-29 (×2): qty 1

## 2012-01-29 MED ORDER — FUROSEMIDE 40 MG PO TABS
40.0000 mg | ORAL_TABLET | Freq: Every day | ORAL | Status: DC
  Administered 2012-01-29 – 2012-01-30 (×2): 40 mg via ORAL
  Filled 2012-01-29 (×2): qty 1

## 2012-01-29 MED ORDER — SODIUM CHLORIDE 0.9 % IJ SOLN
3.0000 mL | Freq: Two times a day (BID) | INTRAMUSCULAR | Status: DC
  Administered 2012-01-29 – 2012-01-30 (×2): 3 mL via INTRAVENOUS

## 2012-01-29 MED ORDER — SODIUM CHLORIDE 0.9 % IV SOLN: 250.0000 mL | INTRAVENOUS | Status: DC | PRN

## 2012-01-29 MED ORDER — MUPIROCIN 2 % EX OINT
TOPICAL_OINTMENT | CUTANEOUS | Status: AC
  Filled 2012-01-29: qty 22

## 2012-01-29 MED ORDER — MUPIROCIN 2 % EX OINT
1.0000 "application " | TOPICAL_OINTMENT | Freq: Two times a day (BID) | CUTANEOUS | Status: DC
  Administered 2012-01-29 – 2012-01-30 (×2): 1 via NASAL
  Filled 2012-01-29: qty 22

## 2012-01-29 MED ORDER — HYDROCODONE-ACETAMINOPHEN 5-325 MG PO TABS: 1.0000 | ORAL_TABLET | ORAL | Status: DC | PRN

## 2012-01-29 MED ORDER — LISINOPRIL 10 MG PO TABS
10.0000 mg | ORAL_TABLET | Freq: Every day | ORAL | Status: DC
  Administered 2012-01-29 – 2012-01-30 (×2): 10 mg via ORAL
  Filled 2012-01-29 (×2): qty 1

## 2012-01-29 NOTE — Interval H&P Note (Signed)
History and Physical Interval Note:  01/29/2012 11:15 AM  Terry Peck  has presented today for surgery, with the diagnosis of Heart Failure  The various methods of treatment have been discussed with the patient and family. After consideration of risks, benefits and other options for treatment, the patient has consented to  Procedure(s) (LRB) with comments: BI-VENTRICULAR IMPLANTABLE CARDIOVERTER DEFIBRILLATOR  (CRT-D) (N/A) as a surgical intervention .  The patient's history has been reviewed, patient examined, no change in status, stable for surgery.  I have reviewed the patient's chart and labs.  Questions were answered to the patient's satisfaction.     Hillis Range

## 2012-01-29 NOTE — H&P (View-Only) (Signed)
Primary Care Physician: Oliver Barre, MD Referring Physician:  Dr Dalene Seltzer is a 71 y.o. 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  He reports that he initially presented 2/13 to Dr Raphael Gibney office with shortness of breath.  He was found to have pulmonary fibrosis as well as a depressed ejection fraction.  He was evaluated by Dr Elease Hashimoto and underwent stress testing which revealed EF 29% with global HK and mild ischemia for which medical management was recommended.  He was also found to have a LBBB.  He was placed on a medical regimen for his CHF.  He reports significant improvement in symptoms of fatigue and decreased exercise tolerance.  Despite optimal medical therapy, follow-up echo 8/13 reveals that his EF remains depressed. He has been evaluated by Dr Colletta Maryland and felt to have a small and shrinking pulmonary nodule as well as early pulmonary fibrosis for which pulmonary rehab was advised.  He underwent pulmonary rehab and continues to improve.  Per pt, it is felt that pulmonary fibrosis may be related to reflux.   Presently he can walk up to 2 miles but has to walk slowly to do so.   Today, he denies symptoms of palpitations, chest pain, orthopnea, PND, lower extremity edema, dizziness, presyncope, syncope, or neurologic sequela. The patient is tolerating medications without difficulties and is otherwise without complaint today.   Past Medical History  Diagnosis Date  . Allergy   . Arthritis   . Diabetes mellitus   . GERD (gastroesophageal reflux disease)   . Hypertension   . Colon polyps 11/23/2010  . Barrett's esophagus 11/23/2010  . Iron deficiency anemia 05/30/2011  . B12 deficiency 05/30/2011  . Abnormal ANCA test 07/03/2011  . Chronic systolic dysfunction of left ventricle   . Pulmonary fibrosis   . Pulmonary nodules    Past Surgical History  Procedure Date  . Tonsillectomy 1952     Current Outpatient Prescriptions  Medication Sig Dispense Refill  . Calcium-Magnesium 300-300 MG TABS Take by mouth daily.        . Calcium-Magnesium-Zinc 1000-400-15 MG TABS Take by mouth daily.        . carvedilol (COREG) 6.25 MG tablet take 2 tablets by mouth twice a day  120 tablet  4  . Cholecalciferol (VITAMIN D) 1000 UNITS capsule Take 1,000 Units by mouth daily.        . Cyanocobalamin (VITAMIN B-12 PO) Take 5,000 mg by mouth daily.       . ferrous fumarate (HEMOCYTE - 106 MG FE) 325 (106 FE) MG TABS Take 1 tablet by mouth daily.      . furosemide (LASIX) 40 MG tablet take 1 tablet by mouth once daily  30 tablet  5  . glipiZIDE (GLUCOTROL XL) 10 MG 24 hr tablet 2 tabs by mouth in the AM  180 tablet  3  . Glucose Blood (BAYER [name] 2 TEST) DISK Test four times daily as directed  200 each  11  . hydrochlorothiazide (HYDRODIURIL) 25 MG tablet Take 25 mg by mouth daily.       Marland Kitchen ibuprofen (ADVIL,MOTRIN) 600 MG tablet Take 600 mg by mouth as needed.        Marland Kitchen lisinopril (PRINIVIL,ZESTRIL) 10 MG tablet take 1 tablet by mouth once daily  30 tablet  5  . Lutein 20 MG CAPS Take by mouth daily.        Marland Kitchen  metFORMIN (GLUCOPHAGE) 850 MG tablet Take 1 tablet (850 mg total) by mouth 3 (three) times daily.  90 tablet  11  . methocarbamol (ROBAXIN) 750 MG tablet Take 1 tablet (750 mg total) by mouth as needed.  60 tablet  2  . Multiple Vitamin (MULTIVITAMIN) tablet Take 1 tablet by mouth daily.        . OMEGA 3 1000 MG CAPS Take by mouth.        Marland Kitchen omeprazole (PRILOSEC OTC) 20 MG tablet Take 1 tablet (20 mg total) by mouth 2 (two) times daily.  180 tablet  3  . pioglitazone (ACTOS) 45 MG tablet TAKE ONE TABLET BY MOUTH AT BEDTIME  90 tablet  1  . SitaGLIPtin Phosphate (JANUVIA PO) Take by mouth daily.      Marland Kitchen DISCONTD: simvastatin (ZOCOR) 80 MG tablet Take 1 tablet (80 mg total) by mouth at bedtime.  90 tablet  3    Allergies  Allergen Reactions  . Iodine   . Penicillins   He reports low grade  fever with dye 25 years ago, which was attributed to iodine.  He does not think that he has had any trouble with non iodinated contrast.  History   Social History  . Marital Status: Married    Spouse Name: N/A    Number of Children: 2  . Years of Education: 16   Occupational History  . 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 Smoking status: Former Smoker -- 0.5 packs/day for 10 years    Types: Cigarettes    Quit date: 04/23/1988  . Smokeless tobacco: Never Used  . Alcohol Use: No  . Drug Use: No  . Sexually Active: Not on file   Other Topics Concern  . Not on file   Social History Narrative   Lives in [place] with spouse.  Retired from [employer] Engineer, production).  Now works Systems developer for [employer].      Family History  Problem Relation Age of Onset  . Heart disease Mother   . Colon cancer Neg Hx     ROS- All systems are reviewed and negative except as per the HPI above  Physical Exam: Filed Vitals:   01/21/12 1033  BP: 134/72  Pulse: 72  Height: 5\' 8"  (1.727 m)  Weight: 185 lb (83.915 kg)  SpO2: 99%    GEN- The patient is well appearing, alert and oriented x 3 today.   Head- normocephalic, atraumatic Eyes-  Sclera clear, conjunctiva pink Ears- hearing intact Oropharynx- clear Neck- supple, no JVP Lymph- no cervical lymphadenopathy Lungs- dry rales at the bases, normal work of breathing Heart- Regular rate and rhythm, no murmurs, rubs or gallops, PMI not laterally displaced GI- soft, NT, ND, + BS Extremities- no clubbing, cyanosis, or edema MS- no significant deformity or atrophy Skin- no rash or lesion Psych- euthymic mood, full affect Neuro- strength and sensation are intact  EKG today reveals sinus rhythm 72 bpm, LBBB (QRS ) 12/19/11- LVEF 30%, global HK, mild MR  Myoview 07/04/11- EF 29%, no significant ischemia   Assessment and Plan:

## 2012-01-29 NOTE — Op Note (Signed)
Marland Kitchen SURGEON:  Hillis Range, MD      PREPROCEDURE DIAGNOSES:   1. Nonischemic cardiomyopathy.   2. New York Heart Association class III, heart failure chronically.   3. Left bundle-branch block.      POSTPROCEDURE DIAGNOSES:   1. Nonischemic cardiomyopathy.   2. New York Heart Association class III heart failure chronically.   3. Left bundle-branch block.      PROCEDURES:    1. Left upper extremity venography  2. Biventricular ICD implantation.  3. Defibrillation threshold testing     INTRODUCTION:  Terry Peck is a 71 y.o. male with a nonischemic CM (EF 30%), NYHA Class III CHF, and LBBB QRS morophology. At this time, he meets MADIT II/ SCD-HeFT criteria for ICD implantation for primary prevention of sudden death.  Given LBBB, the patient may also be expected to benefit from resynchronization therapy. The patient has been treated with an optimal medical regimen but continues to have a depressed ejection fraction and NYHA Class III CHF symptoms.  he therefore  presents today for a biventricular ICD implantation.      DESCRIPTION OF PROCEDURE:  Informed written consent was obtained and the patient was brought to the electrophysiology lab in the fasting state. The patient was adequately sedated with intravenous Versed, and fentanyl as outlined in the nursing report.  The patient's left chest was prepped and draped in the usual sterile fashion by the EP lab staff.  The skin overlying the left deltopectoral region was infiltrated with lidocaine for local analgesia.  A 5-cm incision was made over the left deltopectoral region.  A left subcutaneous defibrillator pocket was fashioned using a combination of sharp and blunt dissection.  Electrocautery was used to assure hemostasis.   Left Upper extremity Venography:  A venogram of the left upper extremity was performed which revealed a moderate sized left axillary vein which emptied into a moderate sized left subclavian vein.    RA/RV Lead  Placement: The left axillary vein was cannulated with fluoroscopic visualization.  Through the left axillary vein, a St. Jude Medical Tendril STS, model 6578IO-96 (serial # [id number]  ) right atrial lead and a St. Jude Medical [place], model 2952W-41 (serial number [id number]) right ventricular defibrillator lead were advanced with fluoroscopic visualization into the right atrial appendage and right ventricular apex positions respectively.  Initial atrial lead P-waves measured 4.2 mV with an impedance of 514 ohms and a threshold of 0.8 volts at 0.5 milliseconds.  The right ventricular lead R-wave measured 12.2 mV with impedance of 753 ohms and a threshold of 0.4 volts at 0.5 milliseconds.   LV Lead Placement: A Medtronic MB-2 guide was advanced through the left axillary vein into the low lateral right atrium.  A Bard curved Damato catheter was introduced through the MB-2 guide and used to cannulate the coronary sinus. The CS was posteriorly directed and could not be easily engaged with the guide.  An extended hook guide was therefore required to cannulate the CS.  Dr Ladona Ridgel provided assistance with the procedure today due to the complicated CS anatomy. Coronary sinus cannulation was confirmed with electrogram recording from the hexapolar catheter.  A coronary sinus selective venography balloon was advanced through the  guide and advanced into the proximal portion of the coronary sinus.  A selective coronary sinus venogram was performed by hand injection of nonionic contrast.  This demonstrated a moderate lateral coronary sinus branch.   A Whisper CSJ wire was introduced through the Guide and advanced into the distal  lateral branch.  A St. Jude Medical Quartet model [id number] (serial number [id number]) lead was advanced through the guide into the lateral branch.  This was in a very lateral position.  In this location, the left ventricular lead R-waves measured  7.6 mV with impedance of 806 ohms and a threshold of  0.8 volt at 0.5  milliseconds in the 1,2 bipolar configuration with no diaphragmatic  stimulation observed when pacing at 10 volts output.  The Medtronic guide was  therefore removed.     All three leads were secured to the pectoralis  fascia using #2 silk suture over the suture sleeves.  The pocket then  irrigated with copious gentamicin solution.  The leads were then  connected to a St. Jude Medical Assura model CD 3265 - 40 (serial  Number [id number]) biventricular ICD.  The defibrillator was placed into the  pocket.  The pocket was then closed in 2 layers with 2.0 Vicryl suture  for the subcutaneous and subcuticular layers.  Steri-Strips and a  sterile dressing were then applied.   DFT Testing: Defibrillation Threshold testing was then performed. Ventricular fibrillation was induced with a T shock.  Adequate sensing of ventricular  fibrillation was observed with no dropout with a programmed sensitivity of 1.79mV.  The patient was successfully defibrillated to sinus rhythm with a single 15 joules shock delivered from the device with an impedance of 73 ohms in a duration of 4.0 seconds.  The patient remained in sinus rhythm thereafter.  There were no early apparent complications.     CONCLUSIONS:   1. Nonischemic cardiomyopathy with Left bundle-branch block and chronic New York Heart Association class III heart failure.   2. Successful biventricular ICD implantation.   3. DFT less than or equal to 15 joules.   4. No early apparent complications.

## 2012-01-30 ENCOUNTER — Encounter (HOSPITAL_COMMUNITY): Payer: Self-pay | Admitting: Cardiology

## 2012-01-30 ENCOUNTER — Ambulatory Visit (HOSPITAL_COMMUNITY): Payer: Medicare Other

## 2012-01-30 LAB — BASIC METABOLIC PANEL
Calcium: 9.4 mg/dL (ref 8.4–10.5)
GFR calc non Af Amer: 72 mL/min — ABNORMAL LOW (ref >90)
Glucose, Bld: 197 mg/dL — ABNORMAL HIGH (ref 70–99)
Potassium: 4 mEq/L (ref 3.5–5.1)
Sodium: 136 mEq/L (ref 135–145)

## 2012-01-30 NOTE — Progress Notes (Signed)
Patient ID: Terry Peck, male   DOB: 10-13-1940, 71 y.o.   MRN: [id number] Subjective:  S/p BiV ICD implant. No chest pain or sob.  Objective:  Vital Signs in the last 24 hours: Temp:  [98.3 F (36.8 C)-98.8 F (37.1 C)] 98.8 F (37.1 C) (10/09 0858) Pulse Rate:  [70-96] 78  (10/09 0858) Resp:  [17-23] 17  (10/09 0858) BP: (104-140)/(51-68) 111/59 mmHg (10/09 0858) SpO2:  [93 %-96 %] 94 % (10/09 0858) Weight:  [161 lb 9.6 oz (73.301 kg)] 161 lb 9.6 oz (73.301 kg) (10/09 0025)  Intake/Output from previous day: 10/08 0701 - 10/09 0700 In: 440 [P.O.:220; I.V.:20; IV Piggyback:200] Out: 450 [Urine:450] Intake/Output from this shift:    Physical Exam: Well appearing NAD HEENT: Unremarkable Neck:  No JVD, no thyromegally Lymphatics:  No adenopathy Back:  No CVA tenderness Lungs:  Clear with no wheezes, rales, or rhonchi. ICD incision without hematoma. HEART:  Regular rate rhythm, no murmurs, no rubs, no clicks Abd:  Flat, positive bowel sounds, no organomegally, no rebound, no guarding Ext:  2 plus pulses, no edema, no cyanosis, no clubbing Skin:  No rashes no nodules Neuro:  CN II through XII intact, motor grossly intact  Lab Results: No results found for this basename: WBC:2,HGB:2,PLT:2 in the last 72 hours  Basename 01/30/12 0522  NA 136  K 4.0  CL 100  CO2 26  GLUCOSE 197*  BUN 16  CREATININE 1.02   No results found for this basename: TROPONINI:2,CK,MB:2 in the last 72 hours Hepatic Function Panel No results found for this basename: PROT,ALBUMIN,AST,ALT,ALKPHOS,BILITOT,BILIDIR,IBILI in the last 72 hours No results found for this basename: CHOL in the last 72 hours No results found for this basename: PROTIME in the last 72 hours  Imaging: Dg Chest 2 View  01/30/2012  *RADIOLOGY REPORT*  Clinical Data: Pacemaker insertion  CHEST - 2 VIEW  Comparison: 05/29/2011  Findings: Left subclavian pacer has been inserted.  No pneumothorax.  Slightly lower lung volumes.   Stable chronic interstitial changes throughout the lungs.  Stable heart size and vascularity.  No developing effusion.  IMPRESSION: Left subclavian pacer insertion.  Chronic interstitial changes with lower lung volumes and increased atelectasis.  No pneumothorax   Original Report Authenticated By: Judie Petit. Ruel Favors, M.D.     Cardiac Studies: Tele - NSR with BiV Pacing. Assessment/Plan:  1. S/p BiV ICD 2. DCM 3. Chronic systolic CHF 4. Interstitial lung disease Rec: his device is working normally. Will plan to discharge home with usual followup.  LOS: 1 day    Lewayne Bunting 01/30/2012, 10:18 AM

## 2012-01-30 NOTE — Discharge Summary (Signed)
Discharge Summary   Patient ID: KESSLER SOLLY MRN: [id number], DOB/AGE: 71-Mar-1942 71 y.o.  Primary MD: Oliver Barre, MD Primary Cardiologist: Dr Elease Hashimoto Primary Electrophysiologist: Dr. Johney Frame Admit date: 01/29/2012 D/C date:     01/30/2012      Primary Discharge Diagnoses:  1. 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 Jude BiV ICD 01/29/12  Secondary Discharge Diagnoses:  . Allergy   . GERD (gastroesophageal reflux disease)   . Hypertension   . Colon polyps 11/23/2010  . Barrett's esophagus 11/23/2010  . Abnormal ANCA test 07/03/2011  . Pulmonary fibrosis   . Pulmonary nodules   . Iron deficiency anemia 05/30/2011  . B12 deficiency anemia   . Shortness of breath     "related to heart being out of rhythm" (01/29/2012)  . Type II diabetes mellitus   . Arthritis     "mild; right shoulder" (01/29/2012)  . Liver abscess 1980's    Allergies Allergies  Allergen Reactions  . Penicillins Anaphylaxis and Other (See Comments)    "swelling of eyes; throat; could breath good" (01/29/2012)  . Iodine Other (See Comments)    "if I get iodine dye, I'll get a fever" (01/29/2012)    Diagnostic Studies/Procedures:   01/29/12 - Biventricular ICD implantation - St. Jude Medical Tendril STS, model 2088TC-52 (serial # [id number] ) right atrial lead - St. Jude Medical [employer], model 6295M-84 (serial number [id number]) right ventricular lead  - St. Jude Medical Quartet model [phone] - 86 (serial number [id number]) left ventricular lead  - St. Jude Medical Assura model CD [phone] 40 (serial Number [id number]) biventricular ICD   History of Present Illness: 71 y.o. male w/ PMHx significant for nonischemic CM (EF 30%), NYHA Class III CHF, and LBBB QRS morophology who presented to [facility] on 01/29/12 for planned ICD implantation for primary prevention of sudden cardiac death.  Hospital Course: He presented to [facility] on 01/29/12 in stable condition and underwent placement  of St. Jude BiV ICD. He tolerated the procedure well without complications. Post op CXR was without pneumothorax or other acute findings. Incisions remained stable without signs of infection. He was seen and evaluated by Dr. Johney Frame who felt he was stable for discharge home with plans for follow up as scheduled below.  Discharge Vitals: Blood pressure 111/59, pulse 78, temperature 98.8 F (37.1 C), temperature source Oral, resp. rate 17, height 5\' 8"  (1.727 m), weight 161 lb 9.6 oz (73.301 kg), SpO2 94.00%.  Labs:   Lab 01/30/12 0522  NA 136  K 4.0  CL 100  CO2 26  BUN 16  CREATININE 1.02  CALCIUM 9.4  GLUCOSE 197*   Discharge Medications     Medication List     As of 01/30/2012  9:56 AM    TAKE these medications         aspirin EC 81 MG tablet   Take 81 mg by mouth daily.      Calcium-Magnesium 300-300 MG Tabs   Take 1 tablet by mouth 2 (two) times daily.      Calcium-Magnesium-Zinc 1000-400-15 MG Tabs   Take 1 tablet by mouth daily.      carvedilol 6.25 MG tablet   Commonly known as: COREG   Take 12.5 mg by mouth 2 (two) times daily with a meal.      ferrous fumarate 325 (106 FE) MG Tabs   Commonly known as: HEMOCYTE - 106 mg FE   Take  1 tablet by mouth daily.      furosemide 40 MG tablet   Commonly known as: LASIX   Take 40 mg by mouth daily.      glipiZIDE 10 MG 24 hr tablet   Commonly known as: GLUCOTROL XL   Take 20 mg by mouth daily with breakfast.      Glucose Blood Disk   Test four times daily as directed      hydrochlorothiazide 25 MG tablet   Commonly known as: HYDRODIURIL   Take 25 mg by mouth daily.      ibuprofen 200 MG tablet   Commonly known as: ADVIL,MOTRIN   Take 200-300 mg by mouth 2 (two) times daily as needed. For pain      lisinopril 10 MG tablet   Commonly known as: PRINIVIL,ZESTRIL   Take 10 mg by mouth daily.      Lutein 20 MG Caps   Take 20 mg by mouth daily.      metFORMIN 850 MG tablet   Commonly known as: GLUCOPHAGE    Take 850 mg by mouth 3 (three) times daily.      methocarbamol 750 MG tablet   Commonly known as: ROBAXIN   Take 750 mg by mouth daily as needed. For back pain      multivitamin with minerals Tabs   Take 1 tablet by mouth daily.      OMEGA 3 1000 MG Caps   Take 1,000 mg by mouth 2 (two) times daily.      omeprazole 20 MG tablet   Commonly known as: PRILOSEC OTC   Take 20 mg by mouth 2 (two) times daily.      OVER THE COUNTER MEDICATION   Take 5,000 mcg by mouth daily. Vitamin B 12 5000 mcg tablet      pioglitazone 45 MG tablet   Commonly known as: ACTOS   Take 45 mg by mouth at bedtime.      sitaGLIPtin 100 MG tablet   Commonly known as: JANUVIA   Take 100 mg by mouth daily.      Vitamin D 1000 UNITS capsule   Take 1,000 Units by mouth daily.         Disposition   Discharge Orders    Future Appointments: Provider: Department: Dept Phone: Center:   02/07/2012 3:30 PM [facility] [phone] [facility]   04/28/2012 10:15 AM Hillis Range, MD [facility] [phone] [facility]   07/02/2012 1:45 PM Corwin Levins, MD [facility] [phone] [facility]     Future Orders Please Complete By Expires   Diet - low sodium heart healthy      Increase activity slowly        Follow-up Information    Follow up with [facility] On 02/07/2012. (At 3:30 PM for wound check)    Contact information:   [address] [place] [address] [phone]      Follow up with Hillis Range, MD. On 04/28/2012. (At 10:15 AM)    Contact information:   [address] [place] [address] [phone]          Outstanding Labs/Studies:  None  Duration of Discharge Encounter: Greater than 30 minutes including physician and PA time.  Signed, HOPE, JESSICA PA-C 01/30/2012, 9:56 AM   Hillis Range MD

## 2012-01-31 ENCOUNTER — Encounter: Payer: Self-pay | Admitting: *Deleted

## 2012-01-31 DIAGNOSIS — Z95810 Presence of automatic (implantable) cardiac defibrillator: Secondary | ICD-10-CM | POA: Insufficient documentation

## 2012-02-04 ENCOUNTER — Other Ambulatory Visit: Payer: Self-pay | Admitting: Internal Medicine

## 2012-02-05 ENCOUNTER — Encounter: Payer: Self-pay | Admitting: Internal Medicine

## 2012-02-05 ENCOUNTER — Other Ambulatory Visit: Payer: Self-pay | Admitting: Internal Medicine

## 2012-02-05 MED ORDER — GLYBURIDE 5 MG PO TABS: 10.0000 mg | ORAL_TABLET | Freq: Two times a day (BID) | ORAL | Status: DC

## 2012-02-07 ENCOUNTER — Ambulatory Visit (INDEPENDENT_AMBULATORY_CARE_PROVIDER_SITE_OTHER): Payer: Medicare Other | Admitting: *Deleted

## 2012-02-07 ENCOUNTER — Encounter: Payer: Self-pay | Admitting: Internal Medicine

## 2012-02-07 DIAGNOSIS — I509 Heart failure, unspecified: Secondary | ICD-10-CM

## 2012-02-07 DIAGNOSIS — I5022 Chronic systolic (congestive) heart failure: Secondary | ICD-10-CM

## 2012-02-07 DIAGNOSIS — I428 Other cardiomyopathies: Secondary | ICD-10-CM

## 2012-02-07 LAB — ICD DEVICE OBSERVATION
AL IMPEDENCE ICD: 462.5 Ohm
ATRIAL PACING ICD: 3.5 pct
BAMS-0001: 150 {beats}/min
BAMS-0003: 70 {beats}/min
LV LEAD IMPEDENCE ICD: 800 Ohm
LV LEAD THRESHOLD: 0.625 V
RV LEAD AMPLITUDE: 11.4 mv
RV LEAD IMPEDENCE ICD: 537.5 Ohm
TOT-0006: 20131008000000
TOT-0007: 1
TOT-0008: 0
TOT-0009: 1
TZON-0003SLOWVT: 350 ms
TZON-0004SLOWVT: 30
VENTRICULAR PACING ICD: 99.98 pct

## 2012-02-07 NOTE — Progress Notes (Signed)
Wound check-ICD 

## 2012-02-12 ENCOUNTER — Encounter: Payer: Self-pay | Admitting: Internal Medicine

## 2012-03-11 ENCOUNTER — Other Ambulatory Visit: Payer: Self-pay | Admitting: *Deleted

## 2012-03-11 MED ORDER — CARVEDILOL 6.25 MG PO TABS: 12.5000 mg | ORAL_TABLET | Freq: Two times a day (BID) | ORAL | Status: DC

## 2012-04-03 ENCOUNTER — Other Ambulatory Visit: Payer: Self-pay | Admitting: Internal Medicine

## 2012-04-28 ENCOUNTER — Ambulatory Visit (INDEPENDENT_AMBULATORY_CARE_PROVIDER_SITE_OTHER): Payer: Medicare Other | Admitting: Internal Medicine

## 2012-04-28 ENCOUNTER — Encounter: Payer: Self-pay | Admitting: Internal Medicine

## 2012-04-28 VITALS — BP 112/64 | HR 72 | Ht 68.0 in | Wt 188.0 lb

## 2012-04-28 DIAGNOSIS — I509 Heart failure, unspecified: Secondary | ICD-10-CM

## 2012-04-28 DIAGNOSIS — I5022 Chronic systolic (congestive) heart failure: Secondary | ICD-10-CM

## 2012-04-28 DIAGNOSIS — Z95810 Presence of automatic (implantable) cardiac defibrillator: Secondary | ICD-10-CM

## 2012-04-28 LAB — ICD DEVICE OBSERVATION
AL AMPLITUDE: 5 mv
BAMS-0001: 150 {beats}/min
HV IMPEDENCE: 74 Ohm
LV LEAD THRESHOLD: 0.875 V
RV LEAD IMPEDENCE ICD: 560 Ohm
TZON-0003SLOWVT: 350 ms
TZON-0004SLOWVT: 30
TZON-0010SLOWVT: 40 ms
VENTRICULAR PACING ICD: 99 pct

## 2012-04-28 NOTE — Patient Instructions (Addendum)
Remote monitoring is used to monitor your Pacemaker of ICD from home. This monitoring reduces the number of office visits required to check your device to one time per year. It allows [place] to keep an eye on the functioning of your device to ensure it is working properly. You are scheduled for a device check from home on July 28, 2012. You may send your transmission at any time that day. If you have a wireless device, the transmission will be sent automatically. After your physician reviews your transmission, you will receive a postcard with your next transmission date.  Your physician wants you to follow-up in: October with Dr Johney Frame.  You will receive a reminder letter in the mail two months in advance. If you don't receive a letter, please call our office to schedule the follow-up appointment.   Your physician has requested that you have an echocardiogram. Echocardiography is a painless test that uses sound waves to create images of your heart. It provides your doctor with information about the size and shape of your heart and how well your heart's chambers and valves are working. This procedure takes approximately one hour. There are no restrictions for this procedure.---in 3 months

## 2012-04-28 NOTE — Progress Notes (Signed)
PCP: Oliver Barre, MD Primary Cardiologist:  Dr Dalene Seltzer is a 72 y.o. male who presents today for routine electrophysiology followup.  Since having his BiV ICD implanted, the patient reports doing very well.  He is pleased with his current health state.  His energy and exercise tolerance are much improved.  Today, he denies symptoms of palpitations, chest pain, shortness of breath,  lower extremity edema, dizziness, presyncope, syncope, or ICD shocks.  The patient is otherwise without complaint today.   Past Medical History  Diagnosis Date  . Allergy   . GERD (gastroesophageal reflux disease)   . Hypertension   . Colon polyps 11/23/2010  . Barrett's esophagus 11/23/2010  . Abnormal ANCA test 07/03/2011  . Chronic systolic dysfunction of left ventricle     EF 30%  . Pulmonary fibrosis   . Pulmonary nodules   . Iron deficiency anemia 05/30/2011  . B12 deficiency anemia   . Bundle branch block, left   . ICD (implantable cardiac defibrillator) in place   . Shortness of breath     "related to heart being out of rhythm" (01/29/2012)  . Type II diabetes mellitus   . Arthritis     "mild; right shoulder" (01/29/2012)  . Liver abscess 1980's  . Nonischemic cardiomyopathy     s/p St. Jude BiV ICD 01/29/12   Past Surgical History  Procedure Date  . Tonsillectomy 1952  . Cardiac defibrillator placement 01/29/2012    SJM Quadra Assura BiV ICD implanted by Dr Johney Frame  . Abscess drained ?1980's    liver    Current Outpatient Prescriptions  Medication Sig Dispense Refill  . aspirin EC 81 MG tablet Take 81 mg by mouth daily.      . Calcium-Magnesium 300-300 MG TABS Take 1 tablet by mouth 2 (two) times daily.       . Calcium-Magnesium-Zinc 1000-400-15 MG TABS Take 1 tablet by mouth daily.       . carvedilol (COREG) 6.25 MG tablet Take 2 tablets (12.5 mg total) by mouth 2 (two) times daily with a meal.  120 tablet  3  . Cholecalciferol (VITAMIN D) 1000 UNITS capsule Take 1,000 Units by  mouth daily.       . ferrous fumarate (HEMOCYTE - 106 MG FE) 325 (106 FE) MG TABS Take 1 tablet by mouth daily.      . furosemide (LASIX) 40 MG tablet Take 40 mg by mouth daily.      . Glucose Blood (BAYER [name] 2 TEST) DISK Test four times daily as directed  200 each  11  . glyBURIDE (DIABETA) 5 MG tablet Take 2 tablets (10 mg total) by mouth 2 (two) times daily with a meal.  360 tablet  3  . hydrochlorothiazide (HYDRODIURIL) 25 MG tablet Take 25 mg by mouth daily.       Marland Kitchen ibuprofen (ADVIL,MOTRIN) 200 MG tablet Take 200-300 mg by mouth 2 (two) times daily as needed. For pain      . JANUVIA 100 MG tablet TAKE ONE TABLET BY MOUTH EVERY DAY  90 tablet  2  . lisinopril (PRINIVIL,ZESTRIL) 10 MG tablet Take 10 mg by mouth daily.      . Lutein 20 MG CAPS Take 20 mg by mouth daily.      . metFORMIN (GLUCOPHAGE) 850 MG tablet Take 850 mg by mouth 3 (three) times daily.      . methocarbamol (ROBAXIN) 750 MG tablet Take 750 mg by mouth daily as needed. For back  pain      . Multiple Vitamin (MULTIVITAMIN WITH MINERALS) TABS Take 1 tablet by mouth daily.      . OMEGA 3 1000 MG CAPS Take 1,000 mg by mouth 2 (two) times daily.       Marland Kitchen omeprazole (PRILOSEC OTC) 20 MG tablet Take 20 mg by mouth 2 (two) times daily.      Marland Kitchen omeprazole (PRILOSEC) 20 MG capsule TAKE ONE CAPSULE BY MOUTH TWICE DAILY  180 capsule  2  . OVER THE COUNTER MEDICATION Take 5,000 mcg by mouth daily. Vitamin B 12 5000 mcg tablet      . pioglitazone (ACTOS) 45 MG tablet Take 45 mg by mouth at bedtime.      . [DISCONTINUED] simvastatin (ZOCOR) 80 MG tablet Take 1 tablet (80 mg total) by mouth at bedtime.  90 tablet  3    Physical Exam: Filed Vitals:   04/28/12 1015  BP: 112/64  Pulse: 72  Height: 5\' 8"  (1.727 m)  Weight: 188 lb (85.276 kg)  SpO2: 93%    GEN- The patient is well appearing, alert and oriented x 3 today.   Head- normocephalic, atraumatic Eyes-  Sclera clear, conjunctiva pink Ears- hearing intact Oropharynx-  clear Lungs- Clear to ausculation bilaterally, normal work of breathing Chest- ICD pocket is well healed Heart- Regular rate and rhythm, no murmurs, rubs or gallops, PMI not laterally displaced GI- soft, NT, ND, + BS Extremities- no clubbing, cyanosis, or edema  ICD interrogation- reviewed in detail today,  See PACEART report  ekg today reveals sinus rhythm with BiV pacing  Assessment and Plan:  1.  Chronic systolic dysfunction euvolemic today Stable on an appropriate medical regimen Normal BiV ICD function See Pace Art report No changes today  Would perform echo in 3 months to assess EF response to CRT.  Return to see me in 9 months

## 2012-05-14 ENCOUNTER — Encounter: Payer: Self-pay | Admitting: Internal Medicine

## 2012-07-02 ENCOUNTER — Ambulatory Visit: Payer: Medicare Other | Admitting: Internal Medicine

## 2012-07-03 ENCOUNTER — Other Ambulatory Visit: Payer: Self-pay | Admitting: Internal Medicine

## 2012-07-14 ENCOUNTER — Other Ambulatory Visit: Payer: Self-pay | Admitting: *Deleted

## 2012-07-14 MED ORDER — CARVEDILOL 6.25 MG PO TABS: 12.5000 mg | ORAL_TABLET | Freq: Two times a day (BID) | ORAL | Status: DC

## 2012-07-14 NOTE — Telephone Encounter (Signed)
PT NEED APPOINTMENT BY NEXT REFILL. Fax Received. Refill Completed. Anila Bojarski Chowoe (R.M.A)

## 2012-07-25 ENCOUNTER — Other Ambulatory Visit: Payer: Self-pay | Admitting: *Deleted

## 2012-07-25 MED ORDER — FUROSEMIDE 40 MG PO TABS: 40.0000 mg | ORAL_TABLET | Freq: Every day | ORAL | Status: DC

## 2012-07-25 NOTE — Telephone Encounter (Signed)
Fax Received. Refill Completed. Terry Peck (R.M.A)  NEED OFFICE VISIT BY 11-2012

## 2012-07-28 ENCOUNTER — Other Ambulatory Visit: Payer: Self-pay | Admitting: Internal Medicine

## 2012-07-28 ENCOUNTER — Ambulatory Visit (INDEPENDENT_AMBULATORY_CARE_PROVIDER_SITE_OTHER): Payer: Medicare Other | Admitting: *Deleted

## 2012-07-28 ENCOUNTER — Encounter: Payer: Self-pay | Admitting: Internal Medicine

## 2012-07-28 ENCOUNTER — Ambulatory Visit (HOSPITAL_COMMUNITY): Payer: Medicare Other | Attending: Internal Medicine | Admitting: Radiology

## 2012-07-28 DIAGNOSIS — Z95810 Presence of automatic (implantable) cardiac defibrillator: Secondary | ICD-10-CM

## 2012-07-28 DIAGNOSIS — I5022 Chronic systolic (congestive) heart failure: Secondary | ICD-10-CM | POA: Insufficient documentation

## 2012-07-28 DIAGNOSIS — I509 Heart failure, unspecified: Secondary | ICD-10-CM | POA: Insufficient documentation

## 2012-07-28 DIAGNOSIS — I428 Other cardiomyopathies: Secondary | ICD-10-CM | POA: Insufficient documentation

## 2012-07-28 NOTE — Progress Notes (Signed)
Echocardiogram performed.  

## 2012-07-29 ENCOUNTER — Other Ambulatory Visit: Payer: Self-pay | Admitting: *Deleted

## 2012-07-29 LAB — REMOTE ICD DEVICE
AL AMPLITUDE: 4.2 mv
AL THRESHOLD: 1.125 V
ATRIAL PACING ICD: 4.1 pct
DEV-0020ICD: NEGATIVE
LV LEAD THRESHOLD: 0.875 V
RV LEAD THRESHOLD: 0.5 V
TZON-0010SLOWVT: 40 ms

## 2012-07-29 MED ORDER — LISINOPRIL 10 MG PO TABS: 10.0000 mg | ORAL_TABLET | Freq: Every day | ORAL | Status: DC

## 2012-07-29 NOTE — Telephone Encounter (Signed)
Fax Received. Refill Completed. Terry Peck (R.M.A)   

## 2012-07-29 NOTE — Telephone Encounter (Signed)
Fax Received. Refill Completed. Ghada Abbett Chowoe (R.M.A)   

## 2012-08-02 ENCOUNTER — Encounter: Payer: Self-pay | Admitting: Internal Medicine

## 2012-08-05 ENCOUNTER — Encounter: Payer: Self-pay | Admitting: *Deleted

## 2012-08-26 ENCOUNTER — Telehealth: Payer: Self-pay | Admitting: Internal Medicine

## 2012-08-26 ENCOUNTER — Ambulatory Visit (INDEPENDENT_AMBULATORY_CARE_PROVIDER_SITE_OTHER)
Admission: RE | Admit: 2012-08-26 | Discharge: 2012-08-26 | Disposition: A | Discharge status: Home / Self Care | Payer: Medicare Other | Source: Ambulatory Visit | Attending: Internal Medicine | Admitting: Internal Medicine

## 2012-08-26 ENCOUNTER — Ambulatory Visit (INDEPENDENT_AMBULATORY_CARE_PROVIDER_SITE_OTHER): Payer: Medicare Other

## 2012-08-26 ENCOUNTER — Telehealth: Payer: Self-pay

## 2012-08-26 DIAGNOSIS — J849 Interstitial pulmonary disease, unspecified: Secondary | ICD-10-CM

## 2012-08-26 DIAGNOSIS — Z0000 Encounter for general adult medical examination without abnormal findings: Secondary | ICD-10-CM

## 2012-08-26 DIAGNOSIS — I10 Essential (primary) hypertension: Secondary | ICD-10-CM

## 2012-08-26 DIAGNOSIS — E119 Type 2 diabetes mellitus without complications: Secondary | ICD-10-CM

## 2012-08-26 DIAGNOSIS — Z125 Encounter for screening for malignant neoplasm of prostate: Secondary | ICD-10-CM

## 2012-08-26 DIAGNOSIS — R911 Solitary pulmonary nodule: Secondary | ICD-10-CM

## 2012-08-26 LAB — CBC WITH DIFFERENTIAL/PLATELET
Basophils Absolute: 0.1 10*3/uL (ref 0.0–0.1)
Eosinophils Absolute: 0.4 10*3/uL (ref 0.0–0.7)
Hemoglobin: 13.1 g/dL (ref 13.0–17.0)
Lymphocytes Relative: 24.4 % (ref 12.0–46.0)
MCHC: 33.5 g/dL (ref 30.0–36.0)
Monocytes Relative: 9.5 % (ref 3.0–12.0)
Neutrophils Relative %: 59.7 % (ref 43.0–77.0)
Platelets: 256 10*3/uL (ref 150.0–400.0)
RDW: 16.2 % — ABNORMAL HIGH (ref 11.5–14.6)

## 2012-08-26 LAB — URINALYSIS, ROUTINE W REFLEX MICROSCOPIC
Bilirubin Urine: NEGATIVE
Nitrite: NEGATIVE
Total Protein, Urine: NEGATIVE
Urine Glucose: NEGATIVE
pH: 7.5 (ref 5.0–8.0)

## 2012-08-26 LAB — BASIC METABOLIC PANEL
CO2: 27 mEq/L (ref 19–32)
Chloride: 100 mEq/L (ref 96–112)
Creatinine, Ser: 1.1 mg/dL (ref 0.4–1.5)
Potassium: 6 mEq/L — ABNORMAL HIGH (ref 3.5–5.1)
Sodium: 132 mEq/L — ABNORMAL LOW (ref 135–145)

## 2012-08-26 LAB — LIPID PANEL
Cholesterol: 234 mg/dL — ABNORMAL HIGH (ref 0–200)
Triglycerides: 224 mg/dL — ABNORMAL HIGH (ref 0.0–149.0)
VLDL: 44.8 mg/dL — ABNORMAL HIGH (ref 0.0–40.0)

## 2012-08-26 LAB — HEPATIC FUNCTION PANEL
ALT: 28 U/L (ref 0–53)
AST: 28 U/L (ref 0–37)
Alkaline Phosphatase: 39 U/L (ref 39–117)
Bilirubin, Direct: 0.1 mg/dL (ref 0.0–0.3)
Total Bilirubin: 0.7 mg/dL (ref 0.3–1.2)
Total Protein: 7.3 g/dL (ref 6.0–8.3)

## 2012-08-26 NOTE — Telephone Encounter (Signed)
Ct scan 08/26/2012  Show no change in nodule since May 2013 and stable pulmonary fibrosis since May 2013. However, tell him there is potential new Rx for his lung fibrosis and to discuss this and to overall assess his pulm health he needs to come into see me asap with full PFT. Next 10 days preferred. I ordered the pft  Thanks  MR         Ct Chest Wo Contrast  08/26/2012  *RADIOLOGY REPORT*  Clinical Data: Follow-up pulmonary nodule  CT CHEST WITHOUT CONTRAST  Technique:  Multidetector CT imaging of the chest was performed following the standard protocol without IV contrast.  Comparison: Chest radiograph 01/30/2012, CT 09/12/2011, CT 05/31/2011  Findings: The area of left upper lobe perifissural area of ground- glass opacity is subjectively similar to the more recent comparison exam, current image 29, and does not demonstrate a nodular configuration or appearance substantially different from other subpleural areas of interstitial/ground-glass opacity.  6 mm left lower lobe pulmonary nodule image 25 is stable.  Stable areas of subpleural reticular markings and cystic change with a peripheral and basilar prominence.  Stable mild predominately bibasilar central bronchiectasis.  Artifact from left-sided defibrillator again noted.  Heart size is normal.  Coronary arterial calcifications or stent.  No pericardial or pleural effusion.  No lymphadenopathy.  Atherosclerotic aortic calcification without aneurysm.  Small hiatal hernia.  Left upper renal pole cortical presumed cyst partly visualized.  No acute osseous finding.  IMPRESSION: No change in subpleural reticular and cystic changes as above, which may be seen with usual interstitial pneumonitis, idiopathic pulmonary fibrosis, or hypersensitivity pneumonitis.  Stable 6 mm left lower lobe pulmonary nodule, no persistent nodularity to the previously seen subpleural area of left upper lobe ground-glass airspace opacity.  Follow-up chest CT is recommended in 1  year to demonstrate 2-year stability and establish probable benignity.   Original Report Authenticated By: Christiana Pellant, M.D.

## 2012-08-26 NOTE — Telephone Encounter (Signed)
Labs entered.

## 2012-08-27 ENCOUNTER — Other Ambulatory Visit (INDEPENDENT_AMBULATORY_CARE_PROVIDER_SITE_OTHER): Payer: Medicare Other

## 2012-08-27 ENCOUNTER — Telehealth: Payer: Self-pay | Admitting: Internal Medicine

## 2012-08-27 DIAGNOSIS — E875 Hyperkalemia: Secondary | ICD-10-CM

## 2012-08-27 NOTE — Telephone Encounter (Signed)
Note per cardiology reviewed;  Pt had labs in prep for upcoming physical  Robin to inform pt that K is elevated, which can be a lab draw error (hemolysis)  For now, to stop lisinopril, also needs repeat K at [facility] lab asap (stat)  If he has any symptoms of weakness, palp, CP or other unusual he should go to ER immediately

## 2012-08-27 NOTE — Telephone Encounter (Signed)
Called the  Patient informed of MD instructions on medication and to return to the lab.  The patient agreed to all and stated he would be back to the lab today.  Patient informed of MD instructions.

## 2012-09-01 ENCOUNTER — Other Ambulatory Visit: Payer: Self-pay | Admitting: Internal Medicine

## 2012-09-02 ENCOUNTER — Ambulatory Visit (INDEPENDENT_AMBULATORY_CARE_PROVIDER_SITE_OTHER): Payer: Medicare Other | Admitting: Internal Medicine

## 2012-09-02 ENCOUNTER — Encounter: Payer: Self-pay | Admitting: Internal Medicine

## 2012-09-02 VITALS — BP 130/80 | HR 86 | Temp 99.2°F | Ht 68.0 in | Wt 185.5 lb

## 2012-09-02 DIAGNOSIS — I10 Essential (primary) hypertension: Secondary | ICD-10-CM

## 2012-09-02 DIAGNOSIS — Z0000 Encounter for general adult medical examination without abnormal findings: Secondary | ICD-10-CM

## 2012-09-02 LAB — BASIC METABOLIC PANEL
BUN: 22 mg/dL (ref 6–23)
Chloride: 104 mEq/L (ref 96–112)
Creatinine, Ser: 1.2 mg/dL (ref 0.4–1.5)
Glucose, Bld: 170 mg/dL — ABNORMAL HIGH (ref 70–99)
Potassium: 4.6 mEq/L (ref 3.5–5.1)

## 2012-09-02 LAB — HEMOGLOBIN A1C: Hgb A1c MFr Bld: 8.5 % — ABNORMAL HIGH (ref 4.6–6.5)

## 2012-09-02 MED ORDER — ATORVASTATIN CALCIUM 40 MG PO TABS: 40.0000 mg | ORAL_TABLET | Freq: Every day | ORAL | Status: DC

## 2012-09-02 NOTE — Assessment & Plan Note (Signed)
stable overall by history and exam, recent data reviewed with pt, and pt to continue medical treatment as before,  to f/u any worsening symptoms or concerns Lab Results  Component Value Date   HGBA1C 8.5* 09/02/2012

## 2012-09-02 NOTE — Patient Instructions (Addendum)
OK to stay off the lisinopril for now, and no other new medication today, except to re-start the cholesterol medication. Please continue all other medications as before Please have the pharmacy call with any other refills you may need. Please continue your efforts at being more active, low cholesterol diet, and weight control. You are otherwise up to date with prevention measures today. Please keep your appointments with your specialists as you have planned - cardiology Please go to the LAB in the Basement (turn left off the elevator) for the tests to be done today You will be contacted by phone if any changes need to be made immediately.  Otherwise, you will receive a letter about your results with an explanation, but please check with MyChart first. Thank you for enrolling in MyChart. Please follow the instructions below to securely access your online medical record. MyChart allows you to send messages to your doctor, view your test results, renew your prescriptions, schedule appointments, and more. Please return in 6 months, or sooner if needed, with Lab testing done 3-5 days before

## 2012-09-02 NOTE — Assessment & Plan Note (Signed)

## 2012-09-02 NOTE — Telephone Encounter (Signed)
[email or web address].Daivd Fredericksen, CMA  

## 2012-09-02 NOTE — Assessment & Plan Note (Signed)
Consider add ARB, hold for now, for repeat K

## 2012-09-02 NOTE — Progress Notes (Signed)
Subjective:    Patient ID: Terry Peck, male    DOB: June 07, 1940, 72 y.o.   MRN: [id number]  HPI  Here for wellness and f/u;  Overall doing ok;  Pt denies CP, worsening SOB, DOE, wheezing, orthopnea, PND, worsening LE edema, palpitations, dizziness or syncope.  Pt denies neurological change such as new headache, facial or extremity weakness.  Pt denies polydipsia, polyuria, or low sugar symptoms. Pt states overall good compliance with treatment and medications, good tolerability, and has been trying to follow lower cholesterol diet.  Pt denies worsening depressive symptoms, suicidal ideation or panic. No fever, night sweats, wt loss, loss of appetite, or other constitutional symptoms.  Pt states good ability with ADL's, has low fall risk, home safety reviewed and adequate, no other significant changes in hearing or vision, and only occasionally active with exercise.  Pt has been off statin for 6 mo, since he read statin can cause DM.  Has seen [name]/Dr Johney Frame recently now with pacemaker/AICD;  No stamina/strength, dyspnea much improved, can mow grass without difficulty, "bestI've felt in decades." Did have elev K last wk, lisinopril stopped, for f/u today. Also with elev Blood sugar 165,, denies polys, did not get a1c with most recent labs.  C/o more stress overly elderly relative not doing well at 72yo. 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 Past Medical History  Diagnosis Date  . Allergy   . GERD (gastroesophageal reflux disease)   . Hypertension   . Colon polyps 11/23/2010  . Barrett's esophagus 11/23/2010  . Abnormal ANCA test 07/03/2011  . Chronic systolic dysfunction of left ventricle     EF 30%  . Pulmonary fibrosis   . Pulmonary nodules   . Iron deficiency anemia 05/30/2011  . B12 deficiency anemia   . Bundle branch block, left   . ICD (implantable cardiac defibrillator) in place   . Shortness of breath     "related to heart being out of rhythm" (01/29/2012)  . Type  II diabetes mellitus   . Arthritis     "mild; right shoulder" (01/29/2012)  . Liver abscess 1980's  . Nonischemic cardiomyopathy     s/p St. Jude BiV ICD 01/29/12   Past Surgical History  Procedure Laterality Date  . Tonsillectomy  1952  . Cardiac defibrillator placement  01/29/2012    SJM Quadra Assura BiV ICD implanted by Dr Johney Frame  . Abscess drained  ?1980's    liver    reports that he quit smoking about 24 years ago. His smoking use included Cigarettes. He has a 5 pack-year smoking history. He has never used smokeless tobacco. He reports that he does not drink alcohol or use illicit drugs. family history includes Heart disease in his mother.  There is no history of Colon cancer. Allergies  Allergen Reactions  . Penicillins Anaphylaxis and Other (See Comments)    "swelling of eyes; throat; could breath good" (01/29/2012)  . Iodine Other (See Comments)    "if I get iodine dye, I'll get a fever" (01/29/2012)   Current Outpatient Prescriptions on File Prior to Visit  Medication Sig Dispense Refill  . aspirin EC 81 MG tablet Take 81 mg by mouth daily.      . Calcium-Magnesium 300-300 MG TABS Take 1 tablet by mouth 2 (two) times daily.       . Calcium-Magnesium-Zinc 1000-400-15 MG TABS Take 1 tablet by mouth daily.       . carvedilol (COREG) 6.25 MG tablet Take  2 tablets (12.5 mg total) by mouth 2 (two) times daily with a meal.  120 tablet  3  . Cholecalciferol (VITAMIN D) 1000 UNITS capsule Take 1,000 Units by mouth daily.       . ferrous fumarate (HEMOCYTE - 106 MG FE) 325 (106 FE) MG TABS Take 1 tablet by mouth daily.      . furosemide (LASIX) 40 MG tablet Take 1 tablet (40 mg total) by mouth daily.  30 tablet  4  . Glucose Blood (BAYER [name] 2 TEST) DISK Test four times daily as directed  200 each  11  . glyBURIDE (DIABETA) 5 MG tablet Take 2 tablets (10 mg total) by mouth 2 (two) times daily with a meal.  360 tablet  3  . hydrochlorothiazide (HYDRODIURIL) 25 MG tablet Take 25 mg by  mouth daily.       Marland Kitchen ibuprofen (ADVIL,MOTRIN) 200 MG tablet Take 200-300 mg by mouth 2 (two) times daily as needed. For pain      . JANUVIA 100 MG tablet TAKE ONE TABLET BY MOUTH EVERY DAY  90 tablet  2  . Lutein 20 MG CAPS Take 20 mg by mouth daily.      . metFORMIN (GLUCOPHAGE) 850 MG tablet TAKE ONE TABLET BY MOUTH THREE TIMES DAILY  90 tablet  4  . methocarbamol (ROBAXIN) 750 MG tablet Take 750 mg by mouth daily as needed. For back pain      . Multiple Vitamin (MULTIVITAMIN WITH MINERALS) TABS Take 1 tablet by mouth daily.      . OMEGA 3 1000 MG CAPS Take 1,000 mg by mouth 2 (two) times daily.       Marland Kitchen omeprazole (PRILOSEC OTC) 20 MG tablet Take 20 mg by mouth 2 (two) times daily.      Marland Kitchen omeprazole (PRILOSEC) 20 MG capsule TAKE ONE CAPSULE BY MOUTH TWICE DAILY  180 capsule  2  . OVER THE COUNTER MEDICATION Take 5,000 mcg by mouth daily. Vitamin B 12 5000 mcg tablet      . pioglitazone (ACTOS) 45 MG tablet TAKE ONE TABLET BY MOUTH AT BEDTIME  90 tablet  2  . [DISCONTINUED] simvastatin (ZOCOR) 80 MG tablet Take 1 tablet (80 mg total) by mouth at bedtime.  90 tablet  3   No current facility-administered medications on file prior to visit.   Review of Systems Constitutional: Negative for diaphoresis, activity change, appetite change or unexpected weight change.  HENT: Negative for hearing loss, ear pain, facial swelling, mouth sores and neck stiffness.   Eyes: Negative for pain, redness and visual disturbance.  Respiratory: Negative for shortness of breath and wheezing.   Cardiovascular: Negative for chest pain and palpitations.  Gastrointestinal: Negative for diarrhea, blood in stool, abdominal distention or other pain Genitourinary: Negative for hematuria, flank pain or change in urine volume.  Musculoskeletal: Negative for myalgias and joint swelling.  Skin: Negative for color change and wound.  Neurological: Negative for syncope and numbness. other than noted Hematological: Negative for  adenopathy.  Psychiatric/Behavioral: Negative for hallucinations, self-injury, decreased concentration and agitation.      Objective:   Physical Exam BP 130/80  Pulse 86  Temp(Src) 99.2 F (37.3 C) (Oral)  Ht 5\' 8"  (1.727 m)  Wt 185 lb 8 oz (84.142 kg)  BMI 28.21 kg/m2  SpO2 93% VS noted,  Constitutional: Pt is oriented to person, place, and time. Appears well-developed and well-nourished.  Head: Normocephalic and atraumatic.  Right Ear: External ear normal.  Left  Ear: External ear normal.  Nose: Nose normal.  Mouth/Throat: Oropharynx is clear and moist.  Eyes: Conjunctivae and EOM are normal. Pupils are equal, round, and reactive to light.  Neck: Normal range of motion. Neck supple. No JVD present. No tracheal deviation present.  Cardiovascular: Normal rate, regular rhythm, normal heart sounds and intact distal pulses.   Pulmonary/Chest: Effort normal and breath sounds normal.  Abdominal: Soft. Bowel sounds are normal. There is no tenderness. No HSM  Musculoskeletal: Normal range of motion. Exhibits no edema.  Lymphadenopathy:  Has no cervical adenopathy.  Neurological: Pt is alert and oriented to person, place, and time. Pt has normal reflexes. No cranial nerve deficit.  Skin: Skin is warm and dry. No rash noted.  Psychiatric:  Has  normal mood and affect. Behavior is normal.     Assessment & Plan:

## 2012-09-03 ENCOUNTER — Telehealth: Payer: Self-pay | Admitting: Internal Medicine

## 2012-09-03 MED ORDER — INSULIN GLARGINE 100 UNIT/ML SOLOSTAR PEN: 10.0000 [IU] | PEN_INJECTOR | Freq: Every day | SUBCUTANEOUS | Status: DC

## 2012-09-03 MED ORDER — INSULIN PEN NEEDLE 32G X 4 MM MISC: 1.0000 [IU] | Freq: Every day | Status: DC

## 2012-09-03 NOTE — Telephone Encounter (Signed)
Ok to stop the glyburide  OK to start Lantus 10 units qhs prn - done erx  F/u OV 6 weeks

## 2012-09-03 NOTE — Telephone Encounter (Signed)
Message copied by Corwin Levins on Wed Sep 03, 2012  5:38 PM ------      Message from: Scharlene Gloss B      Created: Wed Sep 03, 2012  2:20 PM       Called the patient informed of results.  He stated he has used insulin in the past and does not need ROV for instruction if ok to just inform him of changes in medications.  He agreed to start back on insulin ------

## 2012-09-03 NOTE — Telephone Encounter (Signed)
Pt returned call.  After 12:00, pt can be reached at [phone].   Terry Peck

## 2012-09-03 NOTE — Telephone Encounter (Signed)
Ct scan 08/26/2012 Show no change in nodule since May 2013 and stable pulmonary fibrosis since May 2013. However, tell him there is potential new Rx for his lung fibrosis and to discuss this and to overall assess his pulm health he needs to come into see me asap with full PFT. Next 10 days preferred. I ordered the pft  Thanks  MR  ------  lmtcb x1 for pt

## 2012-09-03 NOTE — Telephone Encounter (Signed)
lmomtcb x1 

## 2012-09-04 NOTE — Telephone Encounter (Signed)
Pt notified and was transferred to the front desk to schedule appt for 6 weeks

## 2012-09-04 NOTE — Telephone Encounter (Signed)
Patient returning call.

## 2012-09-05 NOTE — Telephone Encounter (Signed)
LMTCB on work number and at home with wife as well.Carron Curie, CMA

## 2012-09-05 NOTE — Telephone Encounter (Signed)
Duplicate message. Terry Peck, CMA  

## 2012-09-09 NOTE — Telephone Encounter (Signed)
I spoke with Terry Peck. Aware of results. I called [facility] and they scheduled Terry Peck for PFT 5/27/4 at 1:00 over at [facility]. [name] where can Terry Peck be worked in at? Please advise thanks

## 2012-09-09 NOTE — Telephone Encounter (Signed)
LMOMTCB

## 2012-09-09 NOTE — Telephone Encounter (Signed)
I scheduled the pt on 09-17-12 for OV at 4 pm with MR. I LMTCBx1 to see if pt was ok to come on different day from PFT. If not let me know. Carron Curie, CMA

## 2012-09-10 NOTE — Telephone Encounter (Signed)
I spoke with pt. He is aware of PFT appt and location 09/16/12 at 1:00. He is also aware of appt with MR on 09/17/12 at 4:00. He voiced his understanding and needed nothing further

## 2012-09-10 NOTE — Telephone Encounter (Signed)
Pt returned call and can be reached @ [phone]. Terry Peck

## 2012-09-16 ENCOUNTER — Ambulatory Visit (HOSPITAL_COMMUNITY)
Admission: RE | Admit: 2012-09-16 | Discharge: 2012-09-16 | Disposition: A | Discharge status: Home / Self Care | Payer: Medicare Other | Source: Ambulatory Visit | Attending: Internal Medicine | Admitting: Internal Medicine

## 2012-09-16 DIAGNOSIS — J8410 Pulmonary fibrosis, unspecified: Secondary | ICD-10-CM | POA: Insufficient documentation

## 2012-09-16 DIAGNOSIS — I509 Heart failure, unspecified: Secondary | ICD-10-CM | POA: Insufficient documentation

## 2012-09-16 LAB — PULMONARY FUNCTION TEST

## 2012-09-16 MED ORDER — ALBUTEROL SULFATE (5 MG/ML) 0.5% IN NEBU
2.5000 mg | INHALATION_SOLUTION | Freq: Once | RESPIRATORY_TRACT | Status: AC
  Administered 2012-09-16: 2.5 mg via RESPIRATORY_TRACT

## 2012-09-17 ENCOUNTER — Ambulatory Visit (INDEPENDENT_AMBULATORY_CARE_PROVIDER_SITE_OTHER): Payer: Medicare Other | Admitting: Internal Medicine

## 2012-09-17 ENCOUNTER — Encounter: Payer: Self-pay | Admitting: Internal Medicine

## 2012-09-17 VITALS — BP 130/80 | HR 85 | Ht 66.0 in | Wt 186.0 lb

## 2012-09-17 DIAGNOSIS — J84112 Idiopathic pulmonary fibrosis: Secondary | ICD-10-CM

## 2012-09-17 DIAGNOSIS — J849 Interstitial pulmonary disease, unspecified: Secondary | ICD-10-CM

## 2012-09-17 NOTE — Patient Instructions (Addendum)
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

## 2012-09-17 NOTE — Progress Notes (Signed)
Subjective:    Patient ID: Terry Peck, male    DOB: 11/07/1940, 72 y.o.   MRN: [id number]  HPI IOV 06/20/2011 . PMD is Dr Oliver Barre. REferred for lung nodule and dyspnea  72year old male.  reports that he quit smoking about 23 years ago. His smoking use included Cigarettes. He has a 5 pack-year smoking history (1/2 pack per day x 10 years). He has never used smokeless tobacco. Body mass index is 28.15 kg/(m^2). Known DM with nocturia - started on insulin 06/20/2011 , BP, Arthritis, Barretts - seeing Dr Christella Hartigan  Repoirts hx of small lung nodule with serial CT fu at [facility] for 2 years at [facility] ending 5 years ago. STable and dc from followup. Does not remeemer which side  Reports insidious onset of dyspnea on exertion x 1  Year. Progressive gradually. Improved by rest. Notices when he mows yard, walking briskly one flight of stairs notices dyspnea.Always improved by rest. Wife notices he is not able to keep up with her during walks outside the home. No associated weight gain, or admissions to hospitals, no new medical problems diagnosed since onset of symptoms. Denies associated chest pain, cough, fever, weight gain, hemoptysis, edema, orthopnea, paroxysmal nocturnal dyspnea, wheezing. Systolic function was moderately to severely reduced.   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 Has active GERD +. Denies collagen vascular disease. No steel dust, metal dust or asbestos exposure. Denies amio intake. Denies chemo. Does not have pet birds or feathery pillows  ECHO - 05/31/11: ejection fraction was in the range of 30% to 35%. There is hypokinesis of the inferior and inferoseptal myocardium. Doppler parameters are consistent with abnormal left ventricular relaxation (grade 1 diastolic Dysfunction). - Ventricular septum: These changes are consistent with a left bundle branch block. - Mitral valve: Mildly calcified annulus. Mildly  thickenedleaflets . - Pulmonary arteries: Systolic pressure was mildly increased. PA peak pressure: 37mm Hg (S).  Ct Chest Wo Contrast  05/31/2011  *RT CHEST WITHOUT CONTRAST  Technique:   Findings:  No enlarged axillary or supraclavicular lymph nodes.  There is no enlarged mediastinal or hilar lymph nodes.  No pericardial or pleural effusion identified.  There is a patulous and fluid-filled thoracic esophagus.  There is peripheral and basilar predominant interstitial reticulation.  Bibasilar subpleural honeycombing is also identified.  Mild bronchiectasis is present in the lung bases.  Ground-glass attenuation nodular density is noted within the left upper lobe measuring 1.8 cm, image 29.  Solid nodule within left lower lobe measures 6 mm.  Review of the visualized osseous structures is unremarkable.  Limited imaging through the upper abdomen is significant for a left renal cyst.  Only partially visualized and incompletely characterized without IV contrast.  IMPRESSION:  1.  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 2.  Ground-glass attenuating nodule within the left upper lobe is nonspecific and may be related to interstitial lung disease.  Low grade pulmonary adenocarcinoma may have a similar appearance. Follow-up imaging in 3 months is recommended to ensure stability.    PFT 06/06/11:Signs of restrictions c/w the ILD we see on CT present even though TLC 5L/81%  is normal. This is because FVC 2.9L/69% and reduced (fev1 is 2.45L/86% and normal). ratip is 84 and normal. DLCO is reduced 11/50%  REC  #lung nodule  - please sign release for [facility] to send CD rom of CT chest  - likely not cancer but  if we cannot get [facility] records we have to follow this iwht CT in 9 months  #Pulmonary fibrosis  - do not know why but acid reflux could have played a role  - please Serum: ESR, ANA, DS-DNA, RF, anti-CCP, ssA, ssB, scl-70, ANCA, Total CK, Aldolase, Hypersensitivity Pneumonitis  Panel, ACE level  - will call you with results to decide next step  #Shortness of breath  - both due to heart and lung issues  - between the two the heart is more important priority - please see cardiology ASAP  - our Nurse will ensure you have an appointment next few days  #Followup  - based on blood test; probably 1 month   OV 07/19/2011  Review dyspnea related to ILD NOS and new chronic systolic CHF. Since last visit has seen cardiology Dr Elease Hashimoto. States that with cards med mgmt: dyspnea improved a "little bit". Able to do more. Can now climb 2 flights of stairs non-stop (last visit only 1 flight of stairs). 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 Dilated left ventricle with global hypokinesis. Suspect small mild area of reversible ischemia in mid anterior wall. The degree of severe LV dysfunction is out of proportion to the mild ischemia noted. LV Ejection Fraction: 29%. LV Wall Motion: Severe global hypokinesis).  Meds adjusted. Cath on hold. Plan to place pacer if no improvement in EF with med mgmt. In temrs of workup for pulmonary fibrosis:    - 06/20/11 auto-immune test result: all negative including HP panel except anca is trace positive at 1:20 which is the cut off between negative and positive. ESR 29  Past, Family, Social reviewed: no change since last visit except as noted in HPI    #lung nodule  - there are 2 spots in lung and there is possibiloty this could be lung cancer  - we absolultey need the scan fom [facility]; not just report but the CD rom of CT chest  - see if you can get it to [place] next 2 weeks if not repeat ct chest by Aug 28, 2011 (3rd months CT)  #Pulmonary fibrosis  - do not know why but acid reflux could have played a role  - at this point we will follow this - you need breathing test in 6 months or so .  - we will also use the CT chest results for the nodule to follow the pulmonary fibrosis  #Shortness of  breath  - both due to heart and lung issues  - between the two the heart is more important priority - Follow Dr Elease Hashimoto advise  - I will check with your cardiologist about suitability to attend [facility] rehab [facility]  #Followup  - based on your call about the CT chest from [facility]  OV 09/10/2011  Followup pulmonary nodule, ILD non-UIP pattern NOS and dyspnea due to ILD and Systolic CHF   - Last visit was 2 months ago. In terms of nodule, we have been unable to get the actual cd rom from [facility]. So, he will need fu testing here. In terms of pulm fibrosis, Bx  Plans placed on hold due to cardiac status at time of last ov. I learn from 08/01/11 Dr Elease Hashimoto cards notes that plan is for continued medical mgmt. His dyspnea is improved a lot due to great med mgmt and bnp 80s as of 08/01/11. He is able to mow yard now. He thinks dietary portion control  and resultant weight loss has also helped with dyspnea   Past, Family, Social reviewed: no change since last visit  #lung nodule  - there are 2 spots in lung and there is possibiloty this could be lung cancer  - because we could not get scan from [facility], please do CT chest wihtout contrast now  #Pulmonary fibrosis  - do not know why but acid reflux could have played a role  - repeat anca blood test because this was trace positive and will help [place] know if this played a role  - depending on findings on CT chest, might have to consider lung biopsy  - you need breathing test in 6 months or so .  #Shortness of breath  - both due to heart and lung issues; glad you are beter  - between the two the heart is more important priority - Follow Dr Elease Hashimoto advise  - Dr Elease Hashimoto gave okay and will refer you to pulmonary rehab  #Overweight  - follow [facility] Diet sheet I discussed with you; stick to foods in left lane  #Followup  - based on results of blood test and ct chest; if they are okay, I will see you in 3 months with breathing test      Phne call  09/13/11  Called patient with anca blood test result - got him on work phone   - ANCA blood test repeat result negative  - CT scan   - lung nodule report - nodule smaller than before  - scarring or fibrosis in lung - stable as before   Therefore, recommendation  - follow weight loss wit  [facility] diet sheet  - do rehab (already done)   OV 01/09/2012  Dyspnea: Did 12 visits with rehab that helped immensely with dyspnea. Then $ issues. Walking at home non-stop at home with wife with o2 holding. PFT 01/09/12 - fvc 3.11/74%, fev1 2.6L/93% ratipo 84, TLC 73%, DLCO 11/53%. Of note, the FVC is significantly better than feb 2013 (? Weight loss effect) but dlco is same. In terms of weight: lost 9# on [facility] low glycemic diet (6# since [facility] 2013) . Tolerating diet well. No headaches. No hunger pangs. GERD resolved. Soft stools ++. Overall this makes him feel better despite fact echo 12/19/11 is unchangd with LOW Systolic EF 35% and elevated PASP Hg  Past, Family, Social reviewed: no change since last visit except his EF conitnues to be low and he has been referred for pacemaker   #weight  - continue low glycemic diet  - as you continue to lose weight, talk to pcp Oliver Barre, MD about slowly coming off diabetic drugs  #lung nodule  - next ct chest is May 2014  #Lung fibrosis and shortness of breath  - glad shortness of breath better  - contnue exercise and diet  - glad you had flu shot  - will hold off lung biopsy conversation due to risk of bx with EF 35%; atleast till EP eval complete or there is evidence of progression on PFT  #Followup  May 2014 or sooner if needed    OV 09/17/2012  Had pacer October 2013: and since then dyspnea significantly better/ Also had ECHO 07/28/12: shows EF improved to 55% and shows some LVH. AFter above, not having dyspnea for yard work. Able to do all yard work without sitting. Before was sitting 2-3 times. Currently no dyspnea or cough. Feels good  PFT  01/09/12 - fvc 3.11/74%, fev1 2.6L/93% ratipo 84,  TLC 73%, DLCO 11/53%  Pulmonary function tests 09/16/2012. FVC 2.9 L/ 70%. FEV1 2.5 L/93%. Ratio is 86/118%. Total lung capacity is 4.4 L/70%. DLCO is reduced at 40.7/54%. 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   Stable 6 mm left lower lobe pulmonary nodule, no persistent  nodularity to the previously seen subpleural area of left upper  lobe ground-glass airspace opacity. Follow-up chest CT is  recommended in 1 year to demonstrate 2-year stability and establish  probable benignity.  Original Report Authenticated By: Christiana Pellant, M.D.     Review of Systems  Constitutional: Negative for fever and unexpected weight change.  HENT: Negative for ear pain, nosebleeds, congestion, sore throat, rhinorrhea, sneezing, trouble swallowing, dental problem, postnasal drip and sinus pressure.   Eyes: Negative for redness and itching.  Respiratory: Negative for cough, chest tightness, shortness of breath and wheezing.   Cardiovascular: Negative for palpitations and leg swelling.  Gastrointestinal: Negative for nausea and vomiting.  Genitourinary: Negative for dysuria.  Musculoskeletal: Negative for joint swelling.  Skin: Negative for rash.  Neurological: Negative for headaches.  Hematological: Does not bruise/bleed easily.  Psychiatric/Behavioral: Negative for dysphoric mood. The patient is not nervous/anxious.        Objective:   Physical Exam Nursing note and vitals reviewed. Constitutional: He is oriented to person, place, and time. He appears well-developed and well-nourished. No distress.  HENT:  Head: Normocephalic and atraumatic.  Right Ear: External  ear normal.  Left Ear: External ear normal.  Mouth/Throat: Oropharynx is clear and moist. No oropharyngeal exudate.  Eyes: Conjunctivae and EOM are normal. Pupils are equal, round, and reactive to light. Right eye exhibits no discharge. Left eye exhibits no discharge. No scleral icterus.  Neck: Normal range of motion. Neck supple. No JVD present. No tracheal deviation present. No thyromegaly present.  Cardiovascular: Normal rate, regular rhythm and intact distal pulses.  Exam reveals no gallop and no friction rub.   No murmur heard. Pulmonary/Chest: Effort normal. No respiratory distress. He has no wheezes. He has rales. He exhibits no tenderness.       Rales at the very base  Abdominal: Soft. Bowel sounds are normal. He exhibits no distension and no mass. There is no tenderness. There is no rebound and no guarding.  Musculoskeletal: Normal range of motion. He exhibits no edema and no tenderness.  Lymphadenopathy:    He has no cervical adenopathy.  Neurological: He is alert and oriented to person, place, and time. He has normal reflexes. No cranial nerve deficit. Coordination normal.  Skin: Skin is warm and dry. No rash noted. He is not diaphoretic. No erythema. No pallor.  Psychiatric: He has a normal mood and affect. His behavior is normal. Judgment and thought content normal.           Assessment & Plan:

## 2012-09-18 ENCOUNTER — Telehealth: Payer: Self-pay | Admitting: Internal Medicine

## 2012-09-18 NOTE — Telephone Encounter (Signed)
Fayrene Fearing  I am looking at starting him on a study drug pirfenidone for pulmonary fibrosis. AIm to prevent progression of pulmonary fibrosiss. NEJM study May 2014. Drug is awiating FDA approval but I can enroll him through an [employer]. Would need to ensure his QTc is < 500 msec. As of Jan 2014 it was longer. Can you give me  A sense of why his QTc is that long ? Also, any measures taken to address that ? Is he paced now ? Can I repeat EKG now ?  Nexthe cannot be on amiodarone while taking this drug. I do not see it on his med list so want to make sure that is ok   Thanks  MR   Dr. Kalman Shan, M.D., [facility].C.P Pulmonary and Critical Care Medicine Staff Physician [facility] [place] [facility] Pager: [phone], If no answer or between  15:00h - 7:00h: call [phone]  09/18/2012 11:59 PM

## 2012-09-20 NOTE — Assessment & Plan Note (Signed)
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 Age > 70 with subpleural, bibasal densities and in my opinion basal honey combing as well. Autoimmune is negative. Dyspnea resolved/improved significantly after pacer. I explained the disease and diagnosis to him. Explained, that high odds of IPF and diseaes is progressive, and ultimatly fatal and could be debilitating. REcommended that we screen him for pirfenidone (NEJM 2013, online) via [employer]. He is in agreement. He seems to meet criteria for the EAP program except Jan 2014 EKG showed long Qtc. Patient is interested  I wil touch base with Dr Hillis Range to understand his loong QTc better and see if this can be reduced or with a pacer if is a issue at all   > 50% of this > 25 min visit spent in face to face counseling (15 min visit converted to 25 min)

## 2012-09-23 NOTE — Telephone Encounter (Signed)
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  This was initially due to LBBB.  Now, he has a BiV ICD.  His QT remains prolonged with biv Pacing.  I would be cautious with QT prolonging drugs.

## 2012-09-29 ENCOUNTER — Encounter: Payer: Self-pay | Admitting: Internal Medicine

## 2012-10-06 ENCOUNTER — Ambulatory Visit: Payer: Medicare Other | Admitting: Internal Medicine

## 2012-10-13 ENCOUNTER — Other Ambulatory Visit (INDEPENDENT_AMBULATORY_CARE_PROVIDER_SITE_OTHER): Payer: Medicare Other

## 2012-10-13 LAB — BASIC METABOLIC PANEL
Calcium: 9.4 mg/dL (ref 8.4–10.5)
GFR: 85.89 mL/min (ref >60.00)
Potassium: 4.1 mEq/L (ref 3.5–5.1)
Sodium: 138 mEq/L (ref 135–145)

## 2012-10-13 LAB — LIPID PANEL
HDL: 45 mg/dL (ref >39.00)
Total CHOL/HDL Ratio: 5
Triglycerides: 214 mg/dL — ABNORMAL HIGH (ref 0.0–149.0)

## 2012-10-13 LAB — HEPATIC FUNCTION PANEL
AST: 27 U/L (ref 0–37)
Alkaline Phosphatase: 41 U/L (ref 39–117)
Bilirubin, Direct: 0.1 mg/dL (ref 0.0–0.3)

## 2012-10-14 ENCOUNTER — Telehealth: Payer: Self-pay | Admitting: Internal Medicine

## 2012-10-14 ENCOUNTER — Encounter: Payer: Self-pay | Admitting: Internal Medicine

## 2012-10-14 ENCOUNTER — Ambulatory Visit (INDEPENDENT_AMBULATORY_CARE_PROVIDER_SITE_OTHER): Payer: Medicare Other | Admitting: Internal Medicine

## 2012-10-14 VITALS — BP 130/76 | HR 76 | Temp 98.7°F | Ht 68.0 in | Wt 186.4 lb

## 2012-10-14 DIAGNOSIS — Z0000 Encounter for general adult medical examination without abnormal findings: Secondary | ICD-10-CM

## 2012-10-14 DIAGNOSIS — I10 Essential (primary) hypertension: Secondary | ICD-10-CM

## 2012-10-14 DIAGNOSIS — E785 Hyperlipidemia, unspecified: Secondary | ICD-10-CM

## 2012-10-14 DIAGNOSIS — IMO0001 Reserved for inherently not codable concepts without codable children: Secondary | ICD-10-CM

## 2012-10-14 HISTORY — DX: Hyperlipidemia, unspecified: E78.5

## 2012-10-14 MED ORDER — METFORMIN HCL 850 MG PO TABS: ORAL_TABLET | ORAL | Status: DC

## 2012-10-14 MED ORDER — INSULIN GLARGINE 100 UNIT/ML SOLOSTAR PEN: 20.0000 [IU] | PEN_INJECTOR | Freq: Every day | SUBCUTANEOUS | Status: DC

## 2012-10-14 NOTE — Patient Instructions (Signed)
OK to decrease the metformin 850 from three times per day to twice per day, to avoid lower sugars at night OK to increase the lantus to 20 units per day Please call or send MyChart message regarding your blood sugars in 1-2 weeks Please continue all other medications as before, and refills have been done if requested. Please have the pharmacy call with any other refills you may need. Please continue your efforts at being more active, low cholesterol diet, and weight control as you do  Please remember to sign up for My Chart if you have not done so, as this will be important to you in the future with finding out test results, communicating by private email, and scheduling acute appointments online when needed.  Please return in 6 months, or sooner if needed, with Lab testing done 3-5 days before

## 2012-10-14 NOTE — Assessment & Plan Note (Signed)
Uncontrolled, LDL needs < 70, but with med changes today, he delcines further statin change as well, to cont lipitor 40, has good compliacne with med, for better diet as well, f/u next visit Lab Results  Component Value Date   LDLCALC 114* 12/19/2011

## 2012-10-14 NOTE — Assessment & Plan Note (Signed)
stable overall by history and exam, recent data reviewed with pt, and pt to continue medical treatment as before,  to f/u any worsening symptoms or concerns BP Readings from Last 3 Encounters:  10/14/12 130/76  09/17/12 130/80  09/02/12 130/80

## 2012-10-14 NOTE — Assessment & Plan Note (Signed)
Needs better overall control but will likely have lower AM sugars if lantus increased; for incr lantus to 20 units, but also decr metformin 850 to BID from tid. Cont to monitor cbg's, and to send resyults on mychart

## 2012-10-14 NOTE — Telephone Encounter (Signed)
COuldnot get hold of patient.   Please tell him that given his heart issues we felt it would be safer not to give him Pirfenidone for the pulmonary fibrosis till we get more safety data or the fibrosis is advancing/  Thanks  Dr. Kalman Shan, M.D., [facility].C.P Pulmonary and Critical Care Medicine Staff Physician  System [place] [facility] and Critical [facility] Pager: [phone], If no answer or between  15:00h - 7:00h: call [phone]  10/14/2012 9:56 AM

## 2012-10-14 NOTE — Progress Notes (Signed)
Subjective:    Patient ID: Terry Peck, male    DOB: Jun 01, 1940, 72 y.o.   MRN: [id number]  HPI  Here to f/u after meds adjusted last visit for DM; overall doing ok,  Pt denies chest pain, increased sob or doe, wheezing, orthopnea, PND, increased LE swelling, palpitations, dizziness or syncope.  Pt denies polydipsia, polyuria, or low sugar symptoms such as weakness or confusion improved with po intake.  Pt denies new neurological symptoms such as new headache, or facial or extremity weakness or numbness.   Pt states overall good compliance with meds, has been trying to follow lower cholesterol, diabetic diet, with wt overall stable,  but little exercise however. CBg' in the am about 120-175.  No low sugar symtpoms  Not sure he wants to take med proposed per pulm for the IPF.  Plans to try better chol diet. Past Medical History  Diagnosis Date  . Allergy   . GERD (gastroesophageal reflux disease)   . Hypertension   . Colon polyps 11/23/2010  . Barrett's esophagus 11/23/2010  . Abnormal ANCA test 07/03/2011  . Chronic systolic dysfunction of left ventricle     EF 30%  . Pulmonary fibrosis   . Pulmonary nodules   . Iron deficiency anemia 05/30/2011  . B12 deficiency anemia   . Bundle branch block, left   . ICD (implantable cardiac defibrillator) in place   . Shortness of breath     "related to heart being out of rhythm" (01/29/2012)  . Type II diabetes mellitus   . Arthritis     "mild; right shoulder" (01/29/2012)  . Liver abscess 1980's  . Nonischemic cardiomyopathy     s/p [employer] Jude BiV ICD 01/29/12   Past Surgical History  Procedure Laterality Date  . Tonsillectomy  1952  . Cardiac defibrillator placement  01/29/2012    SJM Quadra Assura BiV ICD implanted by Dr Johney Frame  . Abscess drained  ?1980's    liver    reports that he quit smoking about 24 years ago. His smoking use included Cigarettes. He has a 5 pack-year smoking history. He has never used smokeless tobacco. He reports that he  does not drink alcohol or use illicit drugs. family history includes Heart disease in his mother.  There is no history of Colon cancer. Allergies  Allergen Reactions  . Penicillins Anaphylaxis and Other (See Comments)    "swelling of eyes; throat; could breath good" (01/29/2012)  . Iodine Other (See Comments)    "if I get iodine dye, I'll get a fever" (01/29/2012)   Review of Systems  Constitutional: Negative for unexpected weight change, or unusual diaphoresis  HENT: Negative for tinnitus.   Eyes: Negative for photophobia and visual disturbance.  Respiratory: Negative for choking and stridor.   Gastrointestinal: Negative for vomiting and blood in stool.  Genitourinary: Negative for hematuria and decreased urine volume.  Musculoskeletal: Negative for acute joint swelling Skin: Negative for color change and wound.  Neurological: Negative for tremors and numbness other than noted  Psychiatric/Behavioral: Negative for decreased concentration or  hyperactivity.       Objective:   Physical Exam BP 130/76  Pulse 76  Temp(Src) 98.7 F (37.1 C) (Oral)  Ht 5\' 8"  (1.727 m)  Wt 186 lb 6 oz (84.539 kg)  BMI 28.34 kg/m2  SpO2 94% VS noted,  Constitutional: Pt appears well-developed and well-nourished.  HENT: Head: NCAT.  Right Ear: External ear normal.  Left Ear: External ear normal.  Eyes: Conjunctivae and EOM are  normal. Pupils are equal, round, and reactive to light.  Neck: Normal range of motion. Neck supple.  Cardiovascular: Normal rate and regular rhythm.   Pulmonary/Chest: Effort normal and breath sounds normal.  Abd:  Soft, NT, non-distended, + BS Neurological: Pt is alert. Not confused  Skin: Skin is warm. No erythema.  Psychiatric: Pt behavior is normal. Thought content normal.     Assessment & Plan:

## 2012-10-17 ENCOUNTER — Encounter: Payer: Self-pay | Admitting: Cardiovascular Disease

## 2012-10-17 ENCOUNTER — Ambulatory Visit (INDEPENDENT_AMBULATORY_CARE_PROVIDER_SITE_OTHER): Payer: Medicare Other | Admitting: Cardiovascular Disease

## 2012-10-17 VITALS — BP 128/78 | HR 76 | Ht 68.0 in | Wt 189.4 lb

## 2012-10-17 DIAGNOSIS — I5022 Chronic systolic (congestive) heart failure: Secondary | ICD-10-CM

## 2012-10-17 DIAGNOSIS — I5023 Acute on chronic systolic (congestive) heart failure: Secondary | ICD-10-CM

## 2012-10-17 DIAGNOSIS — I509 Heart failure, unspecified: Secondary | ICD-10-CM

## 2012-10-17 NOTE — Telephone Encounter (Signed)
LMTCBx1 on home number, cell number.Carron Curie, CMA

## 2012-10-17 NOTE — Progress Notes (Signed)
Terry Peck Date of Birth  May 15, 1940 [facility]     [place] [facility]  [address]    [address]   [address] [place], [place]  [address]    [place], [place]  [address] [phone]  Fax  [phone]  [phone]  Fax [phone]  Problem list: 1. Left bundle branch block 2. Diabetes mellitus 3. Hyperlipidemia 4. Hypertension 5. Congestive heart failure-EF equals 30-35% 6. Bi-V ICD placement   History of Present Illness:  Terry Peck is a 72 yo who presents for evaluation of dyspnea.  He has been found to have a LBBB and has CHF with an EF of 30-35%.  He gets dyspneac with exercise ( mowing the lawn).  He denies any PND or orthopnea. He describes generalized fatigue with exertion.  The shortness of breath has been present for about a year. He admits to eating a fair amount of salty foods. He eats hot dogs, ham, bacon, sausage. He does not use any exercise but instead uses potassium chloride ( No salt).  He was seen by his medical doctor and was found to have a left bundle branch block. and was referred here for further evaluation. It was at that point that he had an echocardiogram which revealed global left ventricular systolic dysfunction with an EF of 30-35%.  He had a stress Myoview study which reveals an ejection fraction 29%. There is global left ventricular dilatation. There is no evidence of ischemia. His symptoms have improved dramatically since we've stopped his verapamil and start him on carvedilol.  Continue to gradually titrate up his medications. We change his HCTZ to Lasix during his last visit. We also added lisinopril. He's feeling better.  He is able to do more of his normal activities without severe dyspnea.  December 10, 2011 He continues to gradually and steadily improved. He is now able to mow his lawn and edge  without any difficulties. He's doing quite well at cardiac rehabilitation to  October 17, 2012:  [name] is doing great.  He has  definitely seen a benefit from the BI-V pacer / ICD.  He's able to do all of his yard work without significant difficulty or shortness of breath.  No chest pain.  We have not done a cardiac cath - he was not found to have significant ischemia on myoview and he has made tremendous progress with medical therapy for his CHF.   Echo in April , 2014 Study Conclusions  - Left ventricle: The cavity size was normal. Wall thickness was increased in a pattern of severe LVH. Systolic function was normal. The estimated ejection fraction was 55%. Wall motion was normal; there were no regional wall motion abnormalities. - Left atrium: The atrium was mildly dilated. - Atrial septum: No defect or patent foramen ovale was identified.  His LV EF has normalized with medical therapy and the ICD.   Current Outpatient Prescriptions on File Prior to Visit  Medication Sig Dispense Refill  . aspirin EC 81 MG tablet Take 81 mg by mouth daily.      Marland Kitchen atorvastatin (LIPITOR) 40 MG tablet Take 1 tablet (40 mg total) by mouth daily.  90 tablet  3  . Calcium-Magnesium 300-300 MG TABS Take 1 tablet by mouth 2 (two) times daily.       . Calcium-Magnesium-Zinc 1000-400-15 MG TABS Take 1 tablet by mouth daily.       . carvedilol (COREG) 6.25 MG tablet Take 2 tablets (12.5 mg total) by mouth  2 (two) times daily with a meal.  120 tablet  3  . Cholecalciferol (VITAMIN D) 1000 UNITS capsule Take 1,000 Units by mouth daily.       . ferrous fumarate (HEMOCYTE - 106 MG FE) 325 (106 FE) MG TABS Take 1 tablet by mouth daily.      . furosemide (LASIX) 40 MG tablet Take 1 tablet (40 mg total) by mouth daily.  30 tablet  4  . Glucose Blood (BAYER [name] 2 TEST) DISK Test four times daily as directed  200 each  11  . hydrochlorothiazide (HYDRODIURIL) 25 MG tablet Take 25 mg by mouth daily.       Marland Kitchen ibuprofen (ADVIL,MOTRIN) 200 MG tablet Take 200-300 mg by mouth 2 (two) times daily as needed. For pain      . Insulin Glargine (LANTUS  SOLOSTAR) 100 UNIT/ML SOPN Inject 20 Units into the skin daily.  5 pen  PRN  . Insulin Pen Needle (BD PEN NEEDLE NANO U/F) 32G X 4 MM MISC 1 Units by Does not apply route daily.  100 each  5  . JANUVIA 100 MG tablet TAKE ONE TABLET BY MOUTH EVERY DAY  90 tablet  2  . Lutein 20 MG CAPS Take 20 mg by mouth daily.      . metFORMIN (GLUCOPHAGE) 850 MG tablet TAKE ONE TABLET BY MOUTH twice per day  180 tablet  3  . methocarbamol (ROBAXIN) 750 MG tablet Take 750 mg by mouth daily as needed. For back pain      . Multiple Vitamin (MULTIVITAMIN WITH MINERALS) TABS Take 1 tablet by mouth daily.      . OMEGA 3 1000 MG CAPS Take 1,000 mg by mouth 2 (two) times daily.       Marland Kitchen omeprazole (PRILOSEC) 20 MG capsule TAKE ONE CAPSULE BY MOUTH TWICE DAILY  180 capsule  2  . OVER THE COUNTER MEDICATION Take 5,000 mcg by mouth daily. Vitamin B 12 5000 mcg tablet      . pioglitazone (ACTOS) 45 MG tablet TAKE ONE TABLET BY MOUTH AT BEDTIME  90 tablet  2  . [DISCONTINUED] simvastatin (ZOCOR) 80 MG tablet Take 1 tablet (80 mg total) by mouth at bedtime.  90 tablet  3   No current facility-administered medications on file prior to visit.    Allergies  Allergen Reactions  . Penicillins Anaphylaxis and Other (See Comments)    "swelling of eyes; throat; could breath good" (01/29/2012)  . Iodine Other (See Comments)    "if I get iodine dye, I'll get a fever" (01/29/2012)    Past Medical History  Diagnosis Date  . Allergy   . GERD (gastroesophageal reflux disease)   . Hypertension   . Colon polyps 11/23/2010  . Barrett's esophagus 11/23/2010  . Abnormal ANCA test 07/03/2011  . Chronic systolic dysfunction of left ventricle     EF 30%  . Pulmonary fibrosis   . Pulmonary nodules   . Iron deficiency anemia 05/30/2011  . B12 deficiency anemia   . Bundle branch block, left   . ICD (implantable cardiac defibrillator) in place   . Shortness of breath     "related to heart being out of rhythm" (01/29/2012)  . Type II  diabetes mellitus   . Arthritis     "mild; right shoulder" (01/29/2012)  . Liver abscess 1980's  . Nonischemic cardiomyopathy     s/p St. Jude BiV ICD 01/29/12  . Hyperlipidemia 10/14/2012    Past Surgical History  Procedure Laterality Date  . Tonsillectomy  1952  . Cardiac defibrillator placement  01/29/2012    SJM Quadra Assura BiV ICD implanted by Dr Johney Frame  . Abscess drained  ?1980's    liver    History  Smoking status  . Former Smoker -- 0.50 packs/day for 10 years  . Types: Cigarettes  . Quit date: 04/23/1988  Smokeless tobacco  . Never Used    History  Alcohol Use No    Family History  Problem Relation Age of Onset  . Heart disease Mother   . Colon cancer Neg Hx     Reviw of Systems:  Reviewed in the HPI.  All other systems are negative.  Physical Exam: Blood pressure 128/78, pulse 76, height 5\' 8"  (1.727 m), weight 189 lb 6.4 oz (85.911 kg). General: Well developed, well nourished, in no acute distress.  Head: Normocephalic, atraumatic, sclera non-icteric, mucus membranes are moist,   Neck: Supple. Carotids are 2 + without bruits. No JVD  Lungs: clear  Heart: regular rate.  normal  S1 S2. No murmurs, gallops or rubs.  Abdomen: Soft, non-tender, non-distended with normal bowel sounds. No hepatomegaly. No rebound/guarding. No masses.  Msk:  Strength and tone are normal  Extremities: No clubbing or cyanosis. No edema.  Distal pedal pulses are 2+ and equal bilaterally.  Neuro: Alert and oriented X 3. Moves all extremities spontaneously.  Psych:  Responds to questions appropriately with a normal affect.  ECG: October 17, 2012:  NSR at 14, atrial sensing and ventricular pacing. Assessment / Plan:

## 2012-10-17 NOTE — Patient Instructions (Addendum)
Your physician recommends that you return for lab work in: today  Your physician wants you to follow-up in: 6 months You will receive a reminder letter in the mail two months in advance. If you don't receive a letter, please call our office to schedule the follow-up appointment.  Your physician recommends that you return for a FASTING lipid profile: 5 months

## 2012-10-17 NOTE — Assessment & Plan Note (Signed)
Terry Peck is doing well.  His LV function has improved. He has felt so much better since initiation of his blood pressure medications and since his ICD placement.  We will continue with current medications.   I see him back in 6 months for followup office visit with repeat labs.

## 2012-10-20 NOTE — Telephone Encounter (Signed)
Patient returning call.  [phone]

## 2012-10-20 NOTE — Telephone Encounter (Signed)
LMOM TCB x2

## 2012-10-23 ENCOUNTER — Telehealth: Payer: Self-pay | Admitting: Internal Medicine

## 2012-10-23 ENCOUNTER — Encounter: Payer: Self-pay | Admitting: Internal Medicine

## 2012-10-23 NOTE — Telephone Encounter (Signed)
I thought I sent a phone note to JC/Triage sometime back to tell him that because of his heart issues he does not qualify for pirfenidone study and also Dr Johney Frame and Ithought best we wait till more safety data before we decide on pirfenidone even when the drug gets approved  Please tell him that   Dr. Kalman Shan, M.D., [facility].C.P Pulmonary and Critical Care Medicine Staff Physician [facility] Pager: [phone], If no answer or between  15:00h - 7:00h: call [phone]  10/23/2012 10:59 AM

## 2012-10-23 NOTE — Telephone Encounter (Signed)
Pt sent [place] a MyChart message stating that he has changed his mind about participating the Pirfenidone study. He wants to wait to see how his condition is in the next 6 months.

## 2012-10-23 NOTE — Telephone Encounter (Signed)
lmtcb x1 

## 2012-10-23 NOTE — Telephone Encounter (Signed)
Kalman Shan, MD at 10/14/2012 9:56 AM   Status: Signed            COuldnot get hold of patient.  Please tell him that given his heart issues we felt it would be safer not to give him Pirfenidone for the pulmonary fibrosis till we get more safety data or the fibrosis is advancing/  Thanks  ---  lmomtcb x1

## 2012-10-27 NOTE — Telephone Encounter (Signed)
ATC home number and cell number and unable to leave message.  Will try again later.

## 2012-10-27 NOTE — Telephone Encounter (Signed)
Pls see the phone msg from earlier on 10/23/12.  Pt aware of below.

## 2012-10-27 NOTE — Telephone Encounter (Signed)
Called, spoke with pt.  Explained below to him.  He verbalized understanding and voiced no further questions or concerns at this time.

## 2012-11-03 ENCOUNTER — Ambulatory Visit (INDEPENDENT_AMBULATORY_CARE_PROVIDER_SITE_OTHER): Payer: Medicare Other | Admitting: *Deleted

## 2012-11-03 ENCOUNTER — Encounter: Payer: Self-pay | Admitting: Internal Medicine

## 2012-11-03 DIAGNOSIS — I5022 Chronic systolic (congestive) heart failure: Secondary | ICD-10-CM

## 2012-11-03 DIAGNOSIS — I509 Heart failure, unspecified: Secondary | ICD-10-CM

## 2012-11-03 DIAGNOSIS — Z95810 Presence of automatic (implantable) cardiac defibrillator: Secondary | ICD-10-CM

## 2012-11-04 ENCOUNTER — Other Ambulatory Visit: Payer: Self-pay | Admitting: Internal Medicine

## 2012-11-04 LAB — REMOTE ICD DEVICE
AL AMPLITUDE: 4.6 mv
AL THRESHOLD: 1.25 V
DEV-0020ICD: NEGATIVE
LV LEAD THRESHOLD: 1 V
RV LEAD AMPLITUDE: 11.4 mv
RV LEAD THRESHOLD: 0.625 V
TZON-0004SLOWVT: 30
TZON-0010SLOWVT: 40 ms

## 2012-11-05 NOTE — Telephone Encounter (Signed)
[id number]. Jennifer Castillo, CMA  

## 2012-11-07 ENCOUNTER — Encounter: Payer: Self-pay | Admitting: *Deleted

## 2012-11-07 ENCOUNTER — Encounter: Payer: Self-pay | Admitting: Internal Medicine

## 2012-11-07 NOTE — Telephone Encounter (Signed)
I will send letter per protocol.Carron Curie, CMA

## 2012-11-18 ENCOUNTER — Encounter: Payer: Self-pay | Admitting: Internal Medicine

## 2012-12-18 ENCOUNTER — Other Ambulatory Visit: Payer: Self-pay | Admitting: *Deleted

## 2012-12-18 MED ORDER — CARVEDILOL 6.25 MG PO TABS: 12.5000 mg | ORAL_TABLET | Freq: Two times a day (BID) | ORAL | Status: DC

## 2012-12-31 ENCOUNTER — Other Ambulatory Visit: Payer: Self-pay

## 2012-12-31 MED ORDER — FUROSEMIDE 40 MG PO TABS: 40.0000 mg | ORAL_TABLET | Freq: Every day | ORAL | Status: DC

## 2013-01-05 ENCOUNTER — Other Ambulatory Visit: Payer: Self-pay | Admitting: Internal Medicine

## 2013-01-07 ENCOUNTER — Encounter: Payer: Self-pay | Admitting: Internal Medicine

## 2013-01-21 ENCOUNTER — Encounter: Payer: Self-pay | Admitting: Internal Medicine

## 2013-01-21 NOTE — Telephone Encounter (Signed)
Terry Peck to see message above

## 2013-01-28 ENCOUNTER — Encounter: Payer: Medicare Other | Admitting: Internal Medicine

## 2013-02-03 ENCOUNTER — Encounter: Payer: Self-pay | Admitting: Internal Medicine

## 2013-02-05 ENCOUNTER — Ambulatory Visit (INDEPENDENT_AMBULATORY_CARE_PROVIDER_SITE_OTHER): Payer: Medicare Other | Admitting: Internal Medicine

## 2013-02-05 ENCOUNTER — Encounter: Payer: Self-pay | Admitting: Internal Medicine

## 2013-02-05 ENCOUNTER — Ambulatory Visit: Payer: Medicare Other | Admitting: Internal Medicine

## 2013-02-05 VITALS — BP 134/74 | HR 80 | Ht 68.0 in | Wt 187.0 lb

## 2013-02-05 DIAGNOSIS — Z95810 Presence of automatic (implantable) cardiac defibrillator: Secondary | ICD-10-CM

## 2013-02-05 DIAGNOSIS — I5022 Chronic systolic (congestive) heart failure: Secondary | ICD-10-CM

## 2013-02-05 DIAGNOSIS — I509 Heart failure, unspecified: Secondary | ICD-10-CM

## 2013-02-05 LAB — ICD DEVICE OBSERVATION
AL IMPEDENCE ICD: 510 Ohm
BAMS-0001: 150 {beats}/min
BAMS-0003: 70 {beats}/min
DEV-0020ICD: NEGATIVE
DEVICE MODEL ICD: 7054987
HV IMPEDENCE: 79 Ohm
RV LEAD AMPLITUDE: 11.6 mv
TZON-0004SLOWVT: 30
TZON-0005SLOWVT: 6
VENTRICULAR PACING ICD: 99 pct

## 2013-02-05 NOTE — Progress Notes (Signed)
PCP: Oliver Barre, MD Primary Cardiologist: Nahser  Terry Peck is a 72 y.o. male who presents today for routine electrophysiology followup.  Since last being seen in our clinic, the patient reports doing very well. He has normalized his EF post CRT implant. Today, he denies symptoms of palpitations, chest pain, shortness of breath,  lower extremity edema, dizziness, presyncope, syncope, or ICD shocks.  The patient is otherwise without complaint today.   Past Medical History  Diagnosis Date  . Allergy   . GERD (gastroesophageal reflux disease)   . Hypertension   . Colon polyps 11/23/2010  . Barrett's esophagus 11/23/2010  . Abnormal ANCA test 07/03/2011  . Chronic systolic dysfunction of left ventricle     EF 30%  . Pulmonary fibrosis   . Pulmonary nodules   . Iron deficiency anemia 05/30/2011  . B12 deficiency anemia   . Bundle branch block, left   . ICD (implantable cardiac defibrillator) in place   . Shortness of breath     "related to heart being out of rhythm" (01/29/2012)  . Type II diabetes mellitus   . Arthritis     "mild; right shoulder" (01/29/2012)  . Liver abscess 1980's  . Nonischemic cardiomyopathy     s/p St. Jude BiV ICD 01/29/12  . Hyperlipidemia 10/14/2012   Past Surgical History  Procedure Laterality Date  . Tonsillectomy  1952  . Cardiac defibrillator placement  01/29/2012    SJM Quadra Assura BiV ICD implanted by Dr Johney Frame  . Abscess drained  ?1980's    liver    Current Outpatient Prescriptions  Medication Sig Dispense Refill  . aspirin EC 81 MG tablet Take 81 mg by mouth daily.      Marland Kitchen atorvastatin (LIPITOR) 40 MG tablet Take 1 tablet (40 mg total) by mouth daily.  90 tablet  3  . Calcium-Magnesium 300-300 MG TABS Take 1 tablet by mouth 2 (two) times daily.       . Calcium-Magnesium-Zinc 1000-400-15 MG TABS Take 1 tablet by mouth daily.       . carvedilol (COREG) 6.25 MG tablet Take 2 tablets (12.5 mg total) by mouth 2 (two) times daily with a meal.  120  tablet  3  . Cholecalciferol (VITAMIN D) 1000 UNITS capsule Take 1,000 Units by mouth daily.       . ferrous fumarate (HEMOCYTE - 106 MG FE) 325 (106 FE) MG TABS Take 1 tablet by mouth daily.      . furosemide (LASIX) 40 MG tablet Take 1 tablet (40 mg total) by mouth daily.  30 tablet  4  . Glucose Blood (BAYER [name] 2 TEST) DISK Test four times daily as directed  200 each  11  . hydrochlorothiazide (HYDRODIURIL) 25 MG tablet Take 25 mg by mouth daily.       Marland Kitchen ibuprofen (ADVIL,MOTRIN) 200 MG tablet Take 200-300 mg by mouth 2 (two) times daily as needed. For pain      . Insulin Glargine (LANTUS SOLOSTAR) 100 UNIT/ML SOPN Inject 20 Units into the skin daily.  5 pen  PRN  . Insulin Pen Needle (BD PEN NEEDLE NANO U/F) 32G X 4 MM MISC 1 Units by Does not apply route daily.  100 each  5  . JANUVIA 100 MG tablet TAKE ONE TABLET BY MOUTH EVERY DAY  90 tablet  3  . Lutein 20 MG CAPS Take 20 mg by mouth daily.      . metFORMIN (GLUCOPHAGE) 850 MG tablet TAKE ONE TABLET BY  MOUTH twice per day  180 tablet  3  . methocarbamol (ROBAXIN) 750 MG tablet Take 750 mg by mouth daily as needed. For back pain      . Multiple Vitamin (MULTIVITAMIN WITH MINERALS) TABS Take 1 tablet by mouth daily.      . OMEGA 3 1000 MG CAPS Take 1,000 mg by mouth 2 (two) times daily.       Marland Kitchen omeprazole (PRILOSEC) 20 MG capsule TAKE ONE CAPSULE BY MOUTH TWICE DAILY  180 capsule  1  . OVER THE COUNTER MEDICATION Take 5,000 mcg by mouth daily. Vitamin B 12 5000 mcg tablet      . pioglitazone (ACTOS) 45 MG tablet TAKE ONE TABLET BY MOUTH AT BEDTIME  90 tablet  2  . [DISCONTINUED] simvastatin (ZOCOR) 80 MG tablet Take 1 tablet (80 mg total) by mouth at bedtime.  90 tablet  3   No current facility-administered medications for this visit.    Physical Exam: Filed Vitals:   02/05/13 1400  BP: 134/74  Pulse: 80  Height: 5\' 8"  (1.727 m)  Weight: 187 lb (84.823 kg)    GEN- The patient is well appearing, alert and oriented x 3 today.    Head- normocephalic, atraumatic Eyes-  Sclera clear, conjunctiva pink Ears- hearing intact Oropharynx- clear Lungs- Clear to ausculation bilaterally, normal work of breathing Chest- ICD pocket is well healed Heart- Regular rate and rhythm, no murmurs, rubs or gallops, PMI not laterally displaced GI- soft, NT, ND, + BS Extremities- no clubbing, cyanosis, or edema  ICD interrogation- reviewed in detail today,  See PACEART report  Assessment and Plan:  1.  Chronic systolic dysfunction euvolemic today Stable on an appropriate medical regimen Normal ICD function See Pace Art report No changes today    [name]  Return in 1 year

## 2013-02-05 NOTE — Patient Instructions (Addendum)
Your physician wants you to follow-up in: 12 months with Dr Jacquiline Doe will receive a reminder letter in the mail two months in advance. If you don't receive a letter, please call our office to schedule the follow-up appointment.  Remote monitoring is used to monitor your Pacemaker or ICD from home. This monitoring reduces the number of office visits required to check your device to one time per year. It allows [place] to keep an eye on the functioning of your device to ensure it is working properly. You are scheduled for a device check from home on 05/11/12. You may send your transmission at any time that day. If you have a wireless device, the transmission will be sent automatically. After your physician reviews your transmission, you will receive a postcard with your next transmission date.

## 2013-02-06 ENCOUNTER — Ambulatory Visit: Payer: Medicare Other | Admitting: Internal Medicine

## 2013-02-19 ENCOUNTER — Other Ambulatory Visit: Payer: Self-pay | Admitting: Internal Medicine

## 2013-02-19 MED ORDER — METHOCARBAMOL 500 MG PO TABS: 500.0000 mg | ORAL_TABLET | Freq: Every day | ORAL | Status: DC | PRN

## 2013-02-19 NOTE — Telephone Encounter (Signed)
Called left message to call back 

## 2013-02-19 NOTE — Telephone Encounter (Signed)
Done erx 

## 2013-02-19 NOTE — Telephone Encounter (Signed)
Called the patient and he may only need to take once a week or month.  Does not use much at all and the 500 bid would be fine.

## 2013-02-19 NOTE — Telephone Encounter (Signed)
Not clear to me if the 750 robaxin is approp for a refill now; ok to see Rene Kocher if having pain that he thinks might need this for;  At his age I think the 500 bid robaxin may be more appropriate

## 2013-02-26 ENCOUNTER — Other Ambulatory Visit: Payer: Self-pay

## 2013-03-05 ENCOUNTER — Ambulatory Visit: Payer: Medicare Other | Admitting: Internal Medicine

## 2013-03-24 ENCOUNTER — Ambulatory Visit (INDEPENDENT_AMBULATORY_CARE_PROVIDER_SITE_OTHER): Payer: Medicare Other | Admitting: Cardiovascular Disease

## 2013-03-24 ENCOUNTER — Encounter: Payer: Self-pay | Admitting: Cardiovascular Disease

## 2013-03-24 VITALS — BP 118/78 | HR 80 | Ht 68.0 in | Wt 186.6 lb

## 2013-03-24 DIAGNOSIS — I509 Heart failure, unspecified: Secondary | ICD-10-CM

## 2013-03-24 DIAGNOSIS — I10 Essential (primary) hypertension: Secondary | ICD-10-CM

## 2013-03-24 DIAGNOSIS — I5022 Chronic systolic (congestive) heart failure: Secondary | ICD-10-CM

## 2013-03-24 DIAGNOSIS — Z0000 Encounter for general adult medical examination without abnormal findings: Secondary | ICD-10-CM

## 2013-03-24 DIAGNOSIS — Z23 Encounter for immunization: Secondary | ICD-10-CM

## 2013-03-24 MED ORDER — CARVEDILOL 25 MG PO TABS: 25.0000 mg | ORAL_TABLET | Freq: Two times a day (BID) | ORAL | Status: DC

## 2013-03-24 NOTE — Assessment & Plan Note (Signed)
His blood pressure is fairly well controlled.

## 2013-03-24 NOTE — Patient Instructions (Signed)
Your physician has recommended you make the following change in your medication:  Increase carvedilol to 25 mg twice a day/ 12 hours apart.  Your physician wants you to follow-up in:6 months  You will receive a reminder letter in the mail two months in advance. If you don't receive a letter, please call our office to schedule the follow-up appointment.  Your physician recommends that you return for a FASTING lipid profile: 6 months

## 2013-03-24 NOTE — Assessment & Plan Note (Signed)
Matti has done very well. His left ventricular systolic function has improved significantly.  His heart rate is still a little fast. We will increase his carvedilol to 25 mg twice a day. He is to see [place]  in the office in 6 months.

## 2013-03-24 NOTE — Progress Notes (Signed)
Terry Peck Date of Birth  05-10-1940 [facility]     [place] [facility]  [address]    [address]   [address] [place], [place]  [address]    [place], [place]  [address] [phone]  Fax  [phone]  [phone]  Fax [phone]  Problem list: 1. Left bundle branch block 2. Diabetes mellitus 3. Hyperlipidemia 4. Hypertension 5. Congestive heart failure-EF equals 30-35% 6. Bi-V ICD placement   History of Present Illness:  Terry Peck is a 72 yo who presents for evaluation of dyspnea.  He has been found to have a LBBB and has CHF with an EF of 30-35%.  He gets dyspneac with exercise ( mowing the lawn).  He denies any PND or orthopnea. He describes generalized fatigue with exertion.  The shortness of breath has been present for about a year. He admits to eating a fair amount of salty foods. He eats hot dogs, ham, bacon, sausage. He does not use any exercise but instead uses potassium chloride ( No salt).  He was seen by his medical doctor and was found to have a left bundle branch block. and was referred here for further evaluation. It was at that point that he had an echocardiogram which revealed global left ventricular systolic dysfunction with an EF of 30-35%.  He had a stress Myoview study which reveals an ejection fraction 29%. There is global left ventricular dilatation. There is no evidence of ischemia. His symptoms have improved dramatically since we've stopped his verapamil and start him on carvedilol.  Continue to gradually titrate up his medications. We change his HCTZ to Lasix during his last visit. We also added lisinopril. He's feeling better.  He is able to do more of his normal activities without severe dyspnea.  December 10, 2011 He continues to gradually and steadily improved. He is now able to mow his lawn and edge  without any difficulties. He's doing quite well at cardiac rehabilitation to  October 17, 2012:  Terry Peck is doing great.  He has  definitely seen a benefit from the BI-V pacer / ICD.  He's able to do all of his yard work without significant difficulty or shortness of breath.  No chest pain.  We have not done a cardiac cath - he was not found to have significant ischemia on myoview and he has made tremendous progress with medical therapy for his CHF.   Echo in April , 2014 Study Conclusions  - Left ventricle: The cavity size was normal. Wall thickness was increased in a pattern of severe LVH. Systolic function was normal. The estimated ejection fraction was 55%. Wall motion was normal; there were no regional wall motion abnormalities. - Left atrium: The atrium was mildly dilated. - Atrial septum: No defect or patent foramen ovale was identified.  His LV EF has normalized with medical therapy and the ICD.   Dec. 2, 2014:  Terry Peck is doing well.  Exercising well.  No CP, no dyspnea.    Current Outpatient Prescriptions on File Prior to Visit  Medication Sig Dispense Refill  . aspirin EC 81 MG tablet Take 81 mg by mouth daily.      Marland Kitchen atorvastatin (LIPITOR) 40 MG tablet Take 1 tablet (40 mg total) by mouth daily.  90 tablet  3  . Calcium-Magnesium 300-300 MG TABS Take 1 tablet by mouth 2 (two) times daily.       . Calcium-Magnesium-Zinc 1000-400-15 MG TABS Take 1 tablet by mouth daily.       Marland Kitchen  carvedilol (COREG) 6.25 MG tablet Take 2 tablets (12.5 mg total) by mouth 2 (two) times daily with a meal.  120 tablet  3  . Cholecalciferol (VITAMIN D) 1000 UNITS capsule Take 1,000 Units by mouth daily.       . ferrous fumarate (HEMOCYTE - 106 MG FE) 325 (106 FE) MG TABS Take 1 tablet by mouth daily.      . furosemide (LASIX) 40 MG tablet Take 1 tablet (40 mg total) by mouth daily.  30 tablet  4  . Glucose Blood (BAYER [name] 2 TEST) DISK Test four times daily as directed  200 each  11  . hydrochlorothiazide (HYDRODIURIL) 25 MG tablet Take 25 mg by mouth daily.       Marland Kitchen ibuprofen (ADVIL,MOTRIN) 200 MG tablet Take 200-300 mg by  mouth 2 (two) times daily as needed. For pain      . Insulin Glargine (LANTUS SOLOSTAR) 100 UNIT/ML SOPN Inject 20 Units into the skin daily.  5 pen  PRN  . Insulin Pen Needle (BD PEN NEEDLE NANO U/F) 32G X 4 MM MISC 1 Units by Does not apply route daily.  100 each  5  . JANUVIA 100 MG tablet TAKE ONE TABLET BY MOUTH EVERY DAY  90 tablet  3  . Lutein 20 MG CAPS Take 20 mg by mouth daily.      . metFORMIN (GLUCOPHAGE) 850 MG tablet TAKE ONE TABLET BY MOUTH twice per day  180 tablet  3  . methocarbamol (ROBAXIN) 500 MG tablet Take 1 tablet (500 mg total) by mouth daily as needed.  30 tablet  1  . Multiple Vitamin (MULTIVITAMIN WITH MINERALS) TABS Take 1 tablet by mouth daily.      . OMEGA 3 1000 MG CAPS Take 1,000 mg by mouth 2 (two) times daily.       Marland Kitchen omeprazole (PRILOSEC) 20 MG capsule TAKE ONE CAPSULE BY MOUTH TWICE DAILY  180 capsule  1  . OVER THE COUNTER MEDICATION Take 5,000 mcg by mouth daily. Vitamin B 12 5000 mcg tablet      . pioglitazone (ACTOS) 45 MG tablet TAKE ONE TABLET BY MOUTH AT BEDTIME  90 tablet  2  . [DISCONTINUED] simvastatin (ZOCOR) 80 MG tablet Take 1 tablet (80 mg total) by mouth at bedtime.  90 tablet  3   No current facility-administered medications on file prior to visit.    Allergies  Allergen Reactions  . Penicillins Anaphylaxis and Other (See Comments)    "swelling of eyes; throat; could breath good" (01/29/2012)  . Iodine Other (See Comments)    "if I get iodine dye, I'll get a fever" (01/29/2012)    Past Medical History  Diagnosis Date  . Allergy   . GERD (gastroesophageal reflux disease)   . Hypertension   . Colon polyps 11/23/2010  . Barrett's esophagus 11/23/2010  . Abnormal ANCA test 07/03/2011  . Chronic systolic dysfunction of left ventricle     EF 30%  . Pulmonary fibrosis   . Pulmonary nodules   . Iron deficiency anemia 05/30/2011  . B12 deficiency anemia   . Bundle branch block, left   . ICD (implantable cardiac defibrillator) in place   .  Shortness of breath     "related to heart being out of rhythm" (01/29/2012)  . Type II diabetes mellitus   . Arthritis     "mild; right shoulder" (01/29/2012)  . Liver abscess 1980's  . Nonischemic cardiomyopathy     s/p St. Jude  BiV ICD 01/29/12  . Hyperlipidemia 10/14/2012    Past Surgical History  Procedure Laterality Date  . Tonsillectomy  1952  . Cardiac defibrillator placement  01/29/2012    SJM Quadra Assura BiV ICD implanted by Dr Johney Frame  . Abscess drained  ?1980's    liver    History  Smoking status  . Former Smoker -- 0.50 packs/day for 10 years  . Types: Cigarettes  . Quit date: 04/23/1988  Smokeless tobacco  . Never Used    History  Alcohol Use No    Family History  Problem Relation Age of Onset  . Heart disease Mother   . Colon cancer Neg Hx     Reviw of Systems:  Reviewed in the HPI.  All other systems are negative.  Physical Exam: Blood pressure 118/78, pulse 80, height 5\' 8"  (1.727 m), weight 186 lb 9.6 oz (84.641 kg). General: Well developed, well nourished, in no acute distress.  Head: Normocephalic, atraumatic, sclera non-icteric, mucus membranes are moist,   Neck: Supple. Carotids are 2 + without bruits. No JVD  Lungs: clear  Heart: regular rate.  normal  S1 S2. No murmurs, gallops or rubs.  Abdomen: Soft, non-tender, non-distended with normal bowel sounds. No hepatomegaly. No rebound/guarding. No masses.  Msk:  Strength and tone are normal  Extremities: No clubbing or cyanosis. No edema.  Distal pedal pulses are 2+ and equal bilaterally.  Neuro: Alert and oriented X 3. Moves all extremities spontaneously.  Psych:  Responds to questions appropriately with a normal affect.  ECG: October 17, 2012:  NSR at 56, atrial sensing and ventricular pacing. Assessment / Plan:

## 2013-04-10 ENCOUNTER — Other Ambulatory Visit (INDEPENDENT_AMBULATORY_CARE_PROVIDER_SITE_OTHER): Payer: Medicare Other

## 2013-04-10 DIAGNOSIS — IMO0001 Reserved for inherently not codable concepts without codable children: Secondary | ICD-10-CM

## 2013-04-10 DIAGNOSIS — Z125 Encounter for screening for malignant neoplasm of prostate: Secondary | ICD-10-CM

## 2013-04-10 DIAGNOSIS — Z0000 Encounter for general adult medical examination without abnormal findings: Secondary | ICD-10-CM

## 2013-04-10 LAB — BASIC METABOLIC PANEL
CO2: 28 mEq/L (ref 19–32)
Chloride: 101 mEq/L (ref 96–112)
Creatinine, Ser: 1 mg/dL (ref 0.4–1.5)
Glucose, Bld: 153 mg/dL — ABNORMAL HIGH (ref 70–99)
Potassium: 4.5 mEq/L (ref 3.5–5.1)
Sodium: 140 mEq/L (ref 135–145)

## 2013-04-10 LAB — URINALYSIS, ROUTINE W REFLEX MICROSCOPIC
Hgb urine dipstick: NEGATIVE
Ketones, ur: NEGATIVE
Leukocytes, UA: NEGATIVE
Nitrite: NEGATIVE
Specific Gravity, Urine: 1.01 (ref 1.000–1.030)
Urine Glucose: NEGATIVE
Urobilinogen, UA: 0.2 (ref 0.0–1.0)
pH: 6 (ref 5.0–8.0)

## 2013-04-10 LAB — CBC WITH DIFFERENTIAL/PLATELET
Basophils Absolute: 0 10*3/uL (ref 0.0–0.1)
Basophils Relative: 0.3 % (ref 0.0–3.0)
Eosinophils Absolute: 0.3 10*3/uL (ref 0.0–0.7)
Eosinophils Relative: 4 % (ref 0.0–5.0)
Lymphocytes Relative: 26.7 % (ref 12.0–46.0)
Lymphs Abs: 1.8 10*3/uL (ref 0.7–4.0)
MCHC: 32.1 g/dL (ref 30.0–36.0)
Monocytes Relative: 9.4 % (ref 3.0–12.0)
Neutrophils Relative %: 59.6 % (ref 43.0–77.0)
RBC: 5 Mil/uL (ref 4.22–5.81)
RDW: 16.5 % — ABNORMAL HIGH (ref 11.5–14.6)

## 2013-04-10 LAB — LDL CHOLESTEROL, DIRECT: Direct LDL: 179.2 mg/dL

## 2013-04-10 LAB — MICROALBUMIN / CREATININE URINE RATIO
Creatinine,U: 25.2 mg/dL
Microalb Creat Ratio: 0.8 mg/g (ref 0.0–30.0)
Microalb, Ur: 0.2 mg/dL (ref 0.0–1.9)

## 2013-04-10 LAB — LIPID PANEL
Cholesterol: 236 mg/dL — ABNORMAL HIGH (ref 0–200)
Total CHOL/HDL Ratio: 5
Triglycerides: 217 mg/dL — ABNORMAL HIGH (ref 0.0–149.0)

## 2013-04-10 LAB — HEPATIC FUNCTION PANEL
ALT: 27 U/L (ref 0–53)
Albumin: 4.6 g/dL (ref 3.5–5.2)
Alkaline Phosphatase: 44 U/L (ref 39–117)
Bilirubin, Direct: 0.1 mg/dL (ref 0.0–0.3)
Total Protein: 7.9 g/dL (ref 6.0–8.3)

## 2013-04-10 LAB — PSA: PSA: 0.25 ng/mL (ref 0.10–4.00)

## 2013-04-10 LAB — TSH: TSH: 4.13 u[IU]/mL (ref 0.35–5.50)

## 2013-04-14 ENCOUNTER — Ambulatory Visit (INDEPENDENT_AMBULATORY_CARE_PROVIDER_SITE_OTHER): Payer: Medicare Other | Admitting: Internal Medicine

## 2013-04-14 ENCOUNTER — Encounter: Payer: Self-pay | Admitting: Internal Medicine

## 2013-04-14 VITALS — BP 132/80 | HR 86 | Temp 99.7°F | Ht 68.0 in | Wt 187.4 lb

## 2013-04-14 DIAGNOSIS — Z0000 Encounter for general adult medical examination without abnormal findings: Secondary | ICD-10-CM

## 2013-04-14 DIAGNOSIS — Z23 Encounter for immunization: Secondary | ICD-10-CM

## 2013-04-14 DIAGNOSIS — Z2911 Encounter for prophylactic immunotherapy for respiratory syncytial virus (RSV): Secondary | ICD-10-CM

## 2013-04-14 MED ORDER — METFORMIN HCL 850 MG PO TABS: ORAL_TABLET | ORAL | Status: DC

## 2013-04-14 MED ORDER — INSULIN GLARGINE 100 UNIT/ML SOLOSTAR PEN: 25.0000 [IU] | PEN_INJECTOR | Freq: Every day | SUBCUTANEOUS | Status: DC

## 2013-04-14 NOTE — Patient Instructions (Addendum)
You had the new Prevnar pneuomnia shot today Please take all new medication as prescribed - the lantus at 25 units per day, and the metformin to 850 mg three times per day Robin to check to see if your insurance will cover the shingles shot; otherwise you are given the prescription for another pharmacy to do this Please continue all other medications as before, and refills have been done if requested. Please have the pharmacy call with any other refills you may need. Please continue your efforts at being more active, low cholesterol diet, and weight control. You are otherwise up to date with prevention measures today. Please keep your appointments with your specialists as you have planned No further labs needed today  Please return in 6 months, or sooner if needed, with Lab testing done 3-5 days before

## 2013-04-14 NOTE — Addendum Note (Signed)
Addended by: Scharlene Gloss B on: 04/14/2013 03:08 PM   Modules accepted: Orders

## 2013-04-14 NOTE — Assessment & Plan Note (Signed)

## 2013-04-14 NOTE — Progress Notes (Signed)
Pre-visit discussion using our clinic review tool. No additional management support is needed unless otherwise documented below in the visit note.  

## 2013-04-14 NOTE — Assessment & Plan Note (Signed)
Uncontrolled, to incr the lantus to 25 units, and incr the metformin to 3 tabs per day

## 2013-04-14 NOTE — Progress Notes (Signed)
Subjective:    Patient ID: Terry Peck, male    DOB: 10/17/1940, 72 y.o.   MRN: [id number]  HPI   Here for wellness and f/u;  Overall doing ok;  Pt denies CP, worsening SOB, DOE, wheezing, orthopnea, PND, worsening LE edema, palpitations, dizziness or syncope.  Pt denies neurological change such as new headache, facial or extremity weakness.  Pt denies polydipsia, polyuria, or low sugar symptoms. Pt states overall good compliance with treatment and medications, good tolerability, and has been trying to follow lower cholesterol diet.  Pt denies worsening depressive symptoms, suicidal ideation or panic. No fever, night sweats, wt loss, loss of appetite, or other constitutional symptoms.  Pt states good ability with ADL's, has low fall risk, home safety reviewed and adequate, no other significant changes in hearing or vision, and only occasionally active with exercise, some less recently ,and diet not changed.  OVerall feels "like a new man" after the PPM/AICD Past Medical History  Diagnosis Date  . Allergy   . GERD (gastroesophageal reflux disease)   . Hypertension   . Colon polyps 11/23/2010  . Barrett's esophagus 11/23/2010  . Abnormal ANCA test 07/03/2011  . Chronic systolic dysfunction of left ventricle     EF 30%  . Pulmonary fibrosis   . Pulmonary nodules   . Iron deficiency anemia 05/30/2011  . B12 deficiency anemia   . Bundle branch block, left   . ICD (implantable cardiac defibrillator) in place   . Shortness of breath     "related to heart being out of rhythm" (01/29/2012)  . Type II diabetes mellitus   . Arthritis     "mild; right shoulder" (01/29/2012)  . Liver abscess 1980's  . Nonischemic cardiomyopathy     s/p St. Jude BiV ICD 01/29/12  . Hyperlipidemia 10/14/2012   Past Surgical History  Procedure Laterality Date  . Tonsillectomy  1952  . Cardiac defibrillator placement  01/29/2012    SJM Quadra Assura BiV ICD implanted by Dr Johney Frame  . Abscess drained  ?1980's    liver      reports that he quit smoking about 24 years ago. His smoking use included Cigarettes. He has a 5 pack-year smoking history. He has never used smokeless tobacco. He reports that he does not drink alcohol or use illicit drugs. family history includes Heart disease in his mother. There is no history of Colon cancer. Allergies  Allergen Reactions  . Penicillins Anaphylaxis and Other (See Comments)    "swelling of eyes; throat; could breath good" (01/29/2012)  . Iodine Other (See Comments)    "if I get iodine dye, I'll get a fever" (01/29/2012)   Current Outpatient Prescriptions on File Prior to Visit  Medication Sig Dispense Refill  . aspirin EC 81 MG tablet Take 81 mg by mouth daily.      Marland Kitchen atorvastatin (LIPITOR) 40 MG tablet Take 1 tablet (40 mg total) by mouth daily.  90 tablet  3  . Calcium-Magnesium 300-300 MG TABS Take 1 tablet by mouth 2 (two) times daily.       . Calcium-Magnesium-Zinc 1000-400-15 MG TABS Take 1 tablet by mouth daily.       . carvedilol (COREG) 25 MG tablet Take 1 tablet (25 mg total) by mouth 2 (two) times daily with a meal.  180 tablet  3  . Cholecalciferol (VITAMIN D) 1000 UNITS capsule Take 1,000 Units by mouth daily.       . ferrous fumarate (HEMOCYTE - 106 MG FE)  325 (106 FE) MG TABS Take 1 tablet by mouth daily.      . furosemide (LASIX) 40 MG tablet Take 1 tablet (40 mg total) by mouth daily.  30 tablet  4  . Glucose Blood (BAYER [name] 2 TEST) DISK Test four times daily as directed  200 each  11  . hydrochlorothiazide (HYDRODIURIL) 25 MG tablet Take 25 mg by mouth daily.       Marland Kitchen ibuprofen (ADVIL,MOTRIN) 200 MG tablet Take 200-300 mg by mouth 2 (two) times daily as needed. For pain      . Insulin Glargine (LANTUS SOLOSTAR) 100 UNIT/ML SOPN Inject 20 Units into the skin daily.  5 pen  PRN  . Insulin Pen Needle (BD PEN NEEDLE NANO U/F) 32G X 4 MM MISC 1 Units by Does not apply route daily.  100 each  5  . JANUVIA 100 MG tablet TAKE ONE TABLET BY MOUTH EVERY DAY   90 tablet  3  . Lutein 20 MG CAPS Take 20 mg by mouth daily.      . metFORMIN (GLUCOPHAGE) 850 MG tablet TAKE ONE TABLET BY MOUTH twice per day  180 tablet  3  . methocarbamol (ROBAXIN) 500 MG tablet Take 1 tablet (500 mg total) by mouth daily as needed.  30 tablet  1  . Multiple Vitamin (MULTIVITAMIN WITH MINERALS) TABS Take 1 tablet by mouth daily.      . OMEGA 3 1000 MG CAPS Take 1,000 mg by mouth 2 (two) times daily.       Marland Kitchen omeprazole (PRILOSEC) 20 MG capsule TAKE ONE CAPSULE BY MOUTH TWICE DAILY  180 capsule  1  . OVER THE COUNTER MEDICATION Take 5,000 mcg by mouth daily. Vitamin B 12 5000 mcg tablet      . pioglitazone (ACTOS) 45 MG tablet TAKE ONE TABLET BY MOUTH AT BEDTIME  90 tablet  2  . [DISCONTINUED] simvastatin (ZOCOR) 80 MG tablet Take 1 tablet (80 mg total) by mouth at bedtime.  90 tablet  3   No current facility-administered medications on file prior to visit.   Review of Systems Constitutional: Negative for diaphoresis, activity change, appetite change or unexpected weight change.  HENT: Negative for hearing loss, ear pain, facial swelling, mouth sores and neck stiffness.   Eyes: Negative for pain, redness and visual disturbance.  Respiratory: Negative for shortness of breath and wheezing.   Cardiovascular: Negative for chest pain and palpitations.  Gastrointestinal: Negative for diarrhea, blood in stool, abdominal distention or other pain Genitourinary: Negative for hematuria, flank pain or change in urine volume.  Musculoskeletal: Negative for myalgias and joint swelling.  Skin: Negative for color change and wound.  Neurological: Negative for syncope and numbness. other than noted Hematological: Negative for adenopathy.  Psychiatric/Behavioral: Negative for hallucinations, self-injury, decreased concentration and agitation.      Objective:   Physical Exam BP 132/80  Pulse 86  Temp(Src) 99.7 F (37.6 C) (Oral)  Ht 5\' 8"  (1.727 m)  Wt 187 lb 6 oz (84.993 kg)  BMI  28.50 kg/m2  SpO2 92% VS noted,  Constitutional: Pt is oriented to person, place, and time. Appears well-developed and well-nourished.  Head: Normocephalic and atraumatic.  Right Ear: External ear normal.  Left Ear: External ear normal.  Nose: Nose normal.  Mouth/Throat: Oropharynx is clear and moist.  Eyes: Conjunctivae and EOM are normal. Pupils are equal, round, and reactive to light.  Neck: Normal range of motion. Neck supple. No JVD present. No tracheal deviation present.  Cardiovascular: Normal rate, regular rhythm, normal heart sounds and intact distal pulses.   Pulmonary/Chest: Effort normal and breath sounds normal.  Abdominal: Soft. Bowel sounds are normal. There is no tenderness. No HSM  Musculoskeletal: Normal range of motion. Exhibits no edema.  Lymphadenopathy:  Has no cervical adenopathy.  Neurological: Pt is alert and oriented to person, place, and time. Pt has normal reflexes. No cranial nerve deficit.  Skin: Skin is warm and dry. No rash noted.  Psychiatric:  Has  normal mood and affect. Behavior is normal.     Assessment & Plan:

## 2013-04-15 ENCOUNTER — Other Ambulatory Visit: Payer: Self-pay | Admitting: Internal Medicine

## 2013-04-15 ENCOUNTER — Ambulatory Visit: Payer: Medicare Other | Admitting: Cardiovascular Disease

## 2013-05-11 ENCOUNTER — Ambulatory Visit (INDEPENDENT_AMBULATORY_CARE_PROVIDER_SITE_OTHER): Payer: Medicare Other | Admitting: *Deleted

## 2013-05-11 DIAGNOSIS — I509 Heart failure, unspecified: Secondary | ICD-10-CM

## 2013-05-11 DIAGNOSIS — I5022 Chronic systolic (congestive) heart failure: Secondary | ICD-10-CM

## 2013-05-11 LAB — MDC_IDC_ENUM_SESS_TYPE_REMOTE
Battery Remaining Longevity: 77 mo
Battery Voltage: 3.01 V
Brady Statistic AP VP Percent: 5 %
Brady Statistic AP VS Percent: 1 %
Brady Statistic RA Percent Paced: 5 %
HighPow Impedance: 69 Ohm
HighPow Impedance: 69 Ohm
Implantable Pulse Generator Model: 3265
Lead Channel Impedance Value: 450 Ohm
Lead Channel Impedance Value: 600 Ohm
Lead Channel Impedance Value: 950 Ohm
Lead Channel Pacing Threshold Amplitude: 0.625 V
Lead Channel Pacing Threshold Amplitude: 0.875 V
Lead Channel Pacing Threshold Amplitude: 1.125 V
Lead Channel Pacing Threshold Pulse Width: 0.5 ms
Lead Channel Pacing Threshold Pulse Width: 0.5 ms
Lead Channel Pacing Threshold Pulse Width: 0.5 ms
Lead Channel Sensing Intrinsic Amplitude: 11.4 mV
Lead Channel Sensing Intrinsic Amplitude: 4.2 mV
Lead Channel Setting Pacing Amplitude: 2 V
Lead Channel Setting Pacing Amplitude: 2.125
Lead Channel Setting Pacing Pulse Width: 0.5 ms
Lead Channel Setting Pacing Pulse Width: 0.5 ms
Lead Channel Setting Sensing Sensitivity: 0.5 mV
MDC IDC MSMT BATTERY REMAINING PERCENTAGE: 83 %
MDC IDC PG SERIAL: 7054987
MDC IDC SESS DTM: 20150119175238
MDC IDC SET LEADCHNL RV PACING AMPLITUDE: 2 V
MDC IDC SET ZONE DETECTION INTERVAL: 350 ms
MDC IDC STAT BRADY AS VP PERCENT: 95 %
MDC IDC STAT BRADY AS VS PERCENT: 1 %
Zone Setting Detection Interval: 270 ms

## 2013-05-12 ENCOUNTER — Encounter: Payer: Self-pay | Admitting: Internal Medicine

## 2013-05-13 LAB — HM DIABETES EYE EXAM: HM Diabetic Eye Exam: NOT DETECTED

## 2013-05-14 ENCOUNTER — Encounter: Payer: Self-pay | Admitting: Internal Medicine

## 2013-05-19 ENCOUNTER — Encounter: Payer: Self-pay | Admitting: *Deleted

## 2013-06-06 ENCOUNTER — Encounter: Payer: Self-pay | Admitting: Internal Medicine

## 2013-06-08 ENCOUNTER — Other Ambulatory Visit: Payer: Self-pay | Admitting: *Deleted

## 2013-06-08 MED ORDER — METFORMIN HCL 850 MG PO TABS: ORAL_TABLET | ORAL | Status: DC

## 2013-06-08 NOTE — Telephone Encounter (Signed)
Pt sent email stating md increase his metformin to TID. Need updated script sent...Terry Peck

## 2013-06-13 ENCOUNTER — Other Ambulatory Visit: Payer: Self-pay | Admitting: Cardiovascular Disease

## 2013-07-08 ENCOUNTER — Other Ambulatory Visit: Payer: Self-pay | Admitting: Internal Medicine

## 2013-08-12 ENCOUNTER — Ambulatory Visit (INDEPENDENT_AMBULATORY_CARE_PROVIDER_SITE_OTHER): Payer: Medicare Other | Admitting: *Deleted

## 2013-08-12 ENCOUNTER — Encounter: Payer: Self-pay | Admitting: Internal Medicine

## 2013-08-12 DIAGNOSIS — I509 Heart failure, unspecified: Secondary | ICD-10-CM

## 2013-08-12 DIAGNOSIS — I5022 Chronic systolic (congestive) heart failure: Secondary | ICD-10-CM

## 2013-08-12 LAB — MDC_IDC_ENUM_SESS_TYPE_REMOTE
Battery Remaining Percentage: 80 %
Battery Voltage: 2.99 V
Brady Statistic AP VP Percent: 7.1 %
Brady Statistic AP VS Percent: 1 %
Brady Statistic AS VP Percent: 93 %
Brady Statistic RA Percent Paced: 7 %
Date Time Interrogation Session: 20150422060019
HIGH POWER IMPEDANCE MEASURED VALUE: 66 Ohm
HighPow Impedance: 66 Ohm
Lead Channel Impedance Value: 510 Ohm
Lead Channel Impedance Value: 950 Ohm
Lead Channel Pacing Threshold Amplitude: 0.75 V
Lead Channel Pacing Threshold Amplitude: 1 V
Lead Channel Pacing Threshold Pulse Width: 0.5 ms
Lead Channel Pacing Threshold Pulse Width: 0.5 ms
Lead Channel Sensing Intrinsic Amplitude: 11.4 mV
Lead Channel Sensing Intrinsic Amplitude: 4 mV
Lead Channel Setting Pacing Amplitude: 2 V
Lead Channel Setting Pacing Amplitude: 2 V
Lead Channel Setting Pacing Amplitude: 2.125
MDC IDC MSMT BATTERY REMAINING LONGEVITY: 73 mo
MDC IDC MSMT LEADCHNL RA IMPEDANCE VALUE: 450 Ohm
MDC IDC MSMT LEADCHNL RA PACING THRESHOLD AMPLITUDE: 1.125 V
MDC IDC MSMT LEADCHNL RA PACING THRESHOLD PULSEWIDTH: 0.5 ms
MDC IDC PG MODEL: 3265
MDC IDC PG SERIAL: 7054987
MDC IDC SET LEADCHNL LV PACING PULSEWIDTH: 0.5 ms
MDC IDC SET LEADCHNL RV PACING PULSEWIDTH: 0.5 ms
MDC IDC SET LEADCHNL RV SENSING SENSITIVITY: 0.5 mV
MDC IDC SET ZONE DETECTION INTERVAL: 270 ms
MDC IDC SET ZONE DETECTION INTERVAL: 350 ms
MDC IDC STAT BRADY AS VS PERCENT: 1 %

## 2013-08-18 ENCOUNTER — Encounter: Payer: Self-pay | Admitting: *Deleted

## 2013-08-24 ENCOUNTER — Other Ambulatory Visit: Payer: Self-pay | Admitting: *Deleted

## 2013-08-24 ENCOUNTER — Encounter: Payer: Self-pay | Admitting: Internal Medicine

## 2013-08-24 MED ORDER — SITAGLIPTIN PHOSPHATE 100 MG PO TABS: ORAL_TABLET | ORAL | Status: DC

## 2013-08-24 NOTE — Telephone Encounter (Signed)
Sent email needing [place] refill want to change back to 30 day supply instead 90 days due to finances...Johny Chess

## 2013-08-25 NOTE — Progress Notes (Signed)
Remote CRT-D device check. Thresholds and sensing consistent with previous device measurements. Lead impedance trends stable over time. No mode switch episodes recorded. No ventricular arrhythmia episodes recorded. Patient bi-ventricularly pacing >100% of the time. Device programmed with appropriate safety margins. Heart failure diagnostics reviewed by Alvis Lemmings, RN.  Estimated longevity 6.1 years. Next remote 11/16/13.

## 2013-09-21 ENCOUNTER — Other Ambulatory Visit: Payer: Medicare Other

## 2013-09-21 ENCOUNTER — Ambulatory Visit: Payer: Medicare Other | Admitting: Cardiovascular Disease

## 2013-10-13 ENCOUNTER — Other Ambulatory Visit: Payer: Self-pay | Admitting: Internal Medicine

## 2013-10-26 ENCOUNTER — Other Ambulatory Visit: Payer: Self-pay | Admitting: Internal Medicine

## 2013-10-27 ENCOUNTER — Telehealth: Payer: Self-pay | Admitting: *Deleted

## 2013-10-27 DIAGNOSIS — IMO0001 Reserved for inherently not codable concepts without codable children: Secondary | ICD-10-CM

## 2013-10-27 DIAGNOSIS — E1165 Type 2 diabetes mellitus with hyperglycemia: Principal | ICD-10-CM

## 2013-10-27 NOTE — Telephone Encounter (Signed)
Left message on machine for patient to schedule an appointment to follow up with diabetes. Bmet, a1c ordered Message sent to mychart Diabetic bundle

## 2013-10-30 ENCOUNTER — Other Ambulatory Visit: Payer: Self-pay | Admitting: Internal Medicine

## 2013-11-02 ENCOUNTER — Encounter: Payer: Self-pay | Admitting: Cardiovascular Disease

## 2013-11-02 ENCOUNTER — Ambulatory Visit (INDEPENDENT_AMBULATORY_CARE_PROVIDER_SITE_OTHER): Payer: Medicare Other | Admitting: Cardiovascular Disease

## 2013-11-02 VITALS — BP 172/80 | HR 62 | Ht 68.0 in | Wt 188.6 lb

## 2013-11-02 DIAGNOSIS — E785 Hyperlipidemia, unspecified: Secondary | ICD-10-CM

## 2013-11-02 LAB — LIPID PANEL
CHOL/HDL RATIO: 3
Cholesterol: 129 mg/dL (ref 0–200)
HDL: 47.7 mg/dL (ref >39.00)
LDL CALC: 60 mg/dL (ref 0–99)
NONHDL: 81.3
Triglycerides: 108 mg/dL (ref 0.0–149.0)
VLDL: 21.6 mg/dL (ref 0.0–40.0)

## 2013-11-02 LAB — BASIC METABOLIC PANEL
BUN: 20 mg/dL (ref 6–23)
CO2: 27 mEq/L (ref 19–32)
Calcium: 9 mg/dL (ref 8.4–10.5)
Chloride: 107 mEq/L (ref 96–112)
Creatinine, Ser: 0.9 mg/dL (ref 0.4–1.5)
GFR: 93.82 mL/min (ref >60.00)
Glucose, Bld: 161 mg/dL — ABNORMAL HIGH (ref 70–99)
Potassium: 4.6 mEq/L (ref 3.5–5.1)
Sodium: 140 mEq/L (ref 135–145)

## 2013-11-02 LAB — HEPATIC FUNCTION PANEL
ALK PHOS: 35 U/L — AB (ref 39–117)
ALT: 22 U/L (ref 0–53)
AST: 24 U/L (ref 0–37)
Albumin: 3.8 g/dL (ref 3.5–5.2)
BILIRUBIN DIRECT: 0.1 mg/dL (ref 0.0–0.3)
Total Bilirubin: 0.4 mg/dL (ref 0.2–1.2)
Total Protein: 7.3 g/dL (ref 6.0–8.3)

## 2013-11-02 NOTE — Patient Instructions (Signed)
Your physician recommends that you have lab work:  TODAY - cholesterol, liver, and basic metabolic panel  Your physician recommends that you continue on your current medications as directed. Please refer to the Current Medication list given to you today.  Your physician wants you to follow-up in: 6 months with Dr. Acie Fredrickson.  You will receive a reminder letter in the mail two months in advance. If you don't receive a letter, please call our office to schedule the follow-up appointment.

## 2013-11-02 NOTE — Assessment & Plan Note (Signed)
Terry Peck  is doing very well. He is definitely see a benefit after getting  his biventricular pacer. He's not having any chest pain. His blood pressures are  typically well controlled although his readings are a little high today. He did not take his furosemide for the past 2 days.  Continue with same medications. I'll see him again in 6 months. We'll check fasting labs today

## 2013-11-02 NOTE — Progress Notes (Signed)
Terry Peck Date of Birth  Mar 11, 1941 [place] [address]    [place], [place]  [address]    [place], [place]  [address] [phone]  Fax  [phone]  [phone]  Fax [phone]  Problem list: 1. Left bundle branch block 2. Diabetes mellitus 3. Hyperlipidemia 4. Hypertension 5. Congestive heart failure-EF equals 30-35% 6. Bi-V ICD placement   History of Present Illness:  Terry Peck is a 73 yo who presents for evaluation of dyspnea.  He has been found to have a LBBB and has CHF with an EF of 30-35%.  He gets dyspneac with exercise ( mowing the lawn).  He denies any PND or orthopnea. He describes generalized fatigue with exertion.  The shortness of breath has been present for about a year. He admits to eating a fair amount of salty foods. He eats hot dogs, ham, bacon, sausage. He does not use any exercise but instead uses potassium chloride ( No salt).  He was seen by his medical doctor and was found to have a left bundle branch block. and was referred here for further evaluation. It was at that point that he had an echocardiogram which revealed global left ventricular systolic dysfunction with an EF of 30-35%.  He had a stress Myoview study which reveals an ejection fraction 29%. There is global left ventricular dilatation. There is no evidence of ischemia. His symptoms have improved dramatically since we've stopped his verapamil and start him on carvedilol.  Continue to gradually titrate up his medications. We change his HCTZ to Lasix during his last visit. We also added lisinopril. He's feeling better.  He is able to do more of his normal activities without severe dyspnea.  December 10, 2011 He continues to gradually and steadily improved. He is now able to mow his lawn and edge  without any difficulties. He's doing quite well at cardiac rehabilitation to  October 17, 2012:  Terry Peck is doing great.  He has  definitely seen a benefit from the BI-V pacer / ICD.  He's able to do all of his yard work without significant difficulty or shortness of breath.  No chest pain.  We have not done a cardiac cath - he was not found to have significant ischemia on myoview and he has made tremendous progress with medical therapy for his CHF.   Echo in April , 2014 Study Conclusions  - Left ventricle: The cavity size was normal. Wall thickness was increased in a pattern of severe LVH. Systolic function was normal. The estimated ejection fraction was 55%. Wall motion was normal; there were no regional wall motion abnormalities. - Left atrium: The atrium was mildly dilated. - Atrial septum: No defect or patent foramen ovale was identified.  His LV EF has normalized with medical therapy and the ICD.   Dec. 2, 2014:  Terry Peck is doing well.  Exercising well.  No CP, no dyspnea.    November 02, 2013:  Terry Peck is doing well. He did not take his furosemide yesterday or today and so his blood pressure is a little bit higher. He takes his blood pressure on a regular basis at home and his readings are in the normal range.  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 aspirin  EC 81 MG tablet Take 81 mg by mouth daily.      Marland Kitchen atorvastatin (LIPITOR) 40 MG tablet TAKE ONE TABLET BY MOUTH ONCE DAILY  90 tablet  2  . Calcium-Magnesium-Zinc 1000-400-15 MG TABS Take 1 tablet by mouth daily.       . carvedilol (COREG) 25 MG tablet Take 1 tablet (25 mg total) by mouth 2 (two) times daily with a meal.  180 tablet  3  . Cholecalciferol (VITAMIN D) 1000 UNITS capsule Take 1,000 Units by mouth daily.       . ferrous fumarate (HEMOCYTE - 106 MG FE) 325 (106 FE) MG TABS Take 1 tablet by mouth daily.      . furosemide (LASIX) 40 MG tablet TAKE ONE TABLET BY MOUTH ONCE DAILY  30 tablet  3  . Glucose  Blood (BAYER [name] 2 TEST) DISK Test four times daily as directed  200 each  11  . hydrochlorothiazide (HYDRODIURIL) 25 MG tablet Take 25 mg by mouth daily.       Marland Kitchen ibuprofen (ADVIL,MOTRIN) 200 MG tablet Take 200-300 mg by mouth 2 (two) times daily as needed. For pain      . Insulin Glargine (LANTUS SOLOSTAR) 100 UNIT/ML SOPN Inject 25 Units into the skin daily.  5 pen  PRN  . Insulin Pen Needle (BD PEN NEEDLE NANO U/F) 32G X 4 MM MISC 1 Units by Does not apply route daily.  100 each  5  . LANTUS SOLOSTAR 100 UNIT/ML Solostar Pen INJECT 10 UNITS SUBCUTANEOUSLY EVERY DAY  15 pen  0  . Lutein 20 MG CAPS Take 20 mg by mouth daily.      . metFORMIN (GLUCOPHAGE) 850 MG tablet TAKE ONE TABLET BY MOUTH three times per day  270 tablet  3  . methocarbamol (ROBAXIN) 500 MG tablet Take 1 tablet (500 mg total) by mouth daily as needed.  30 tablet  1  . Multiple Vitamin (MULTIVITAMIN WITH MINERALS) TABS Take 1 tablet by mouth daily.      . OMEGA 3 1000 MG CAPS Take 1,000 mg by mouth 2 (two) times daily.       Marland Kitchen omeprazole (PRILOSEC) 20 MG capsule TAKE ONE CAPSULE BY MOUTH TWICE DAILY  180 capsule  2  . OVER THE COUNTER MEDICATION Take 5,000 mcg by mouth daily. Vitamin B 12 5000 mcg tablet      . pioglitazone (ACTOS) 45 MG tablet TAKE ONE TABLET BY MOUTH AT BEDTIME  90 tablet  3  . sitaGLIPtin (JANUVIA) 100 MG tablet TAKE ONE TABLET BY MOUTH EVERY DAY  30 tablet  7  . [DISCONTINUED] simvastatin (ZOCOR) 80 MG tablet Take 1 tablet (80 mg total) by mouth at bedtime.  90 tablet  3   No current facility-administered medications on file prior to visit.    Allergies  Allergen Reactions  . Penicillins Anaphylaxis and Other (See Comments)    "swelling of eyes; throat; could breath good" (01/29/2012)  . Iodine Other (See Comments)    "if I get iodine dye, I'll get a fever" (01/29/2012)    Past Medical History  Diagnosis Date  . Allergy   . GERD (gastroesophageal reflux disease)   . Hypertension   . Colon  polyps 11/23/2010  . Barrett's esophagus 11/23/2010  . Abnormal ANCA test 07/03/2011  . Chronic systolic dysfunction of left ventricle     EF 30%  . Pulmonary fibrosis   . Pulmonary nodules   . Iron deficiency anemia 05/30/2011  .  B12 deficiency anemia   . Bundle branch block, left   . ICD (implantable cardiac defibrillator) in place   . Shortness of breath     "related to heart being out of rhythm" (01/29/2012)  . Type II diabetes mellitus   . Arthritis     "mild; right shoulder" (01/29/2012)  . Liver abscess 1980's  . Nonischemic cardiomyopathy     s/p St. Jude BiV ICD 01/29/12  . Hyperlipidemia 10/14/2012    Past Surgical History  Procedure Laterality Date  . Tonsillectomy  1952  . Cardiac defibrillator placement  01/29/2012    SJM Quadra Assura BiV ICD implanted by Dr Rayann Heman  . Abscess drained  ?1980's    liver    History  Smoking status  . Former Smoker -- 0.50 packs/day for 10 years  . Types: Cigarettes  . Quit date: 04/23/1988  Smokeless tobacco  . Never Used    History  Alcohol Use No    Family History  Problem Relation Age of Onset  . Heart disease Mother   . Colon cancer Neg Hx     Reviw of Systems:  Reviewed in the HPI.  All other systems are negative.  Physical Exam: Blood pressure 172/80, pulse 62, height 5\' 8"  (1.727 m), weight 188 lb 9.6 oz (85.548 kg). General: Well developed, well nourished, in no acute distress.  Head: Normocephalic, atraumatic, sclera non-icteric, mucus membranes are moist,   Neck: Supple. Carotids are 2 + without bruits. No JVD  Lungs: clear  Heart: regular rate.  normal  S1 S2. No murmurs, gallops or rubs.  Abdomen: Soft, non-tender, non-distended with normal bowel sounds. No hepatomegaly. No rebound/guarding. No masses.  Msk:  Strength and tone are normal  Extremities: No clubbing or cyanosis. No edema.  Distal pedal pulses are 2+ and equal bilaterally.  Neuro: Alert and oriented X 3. Moves all extremities  spontaneously.  Psych:  Responds to questions appropriately with a normal affect.  ECG: October 17, 2012:  NSR at 46, atrial sensing and ventricular pacing. Assessment / Plan:

## 2013-11-16 ENCOUNTER — Encounter: Payer: Self-pay | Admitting: *Deleted

## 2013-11-16 ENCOUNTER — Ambulatory Visit (INDEPENDENT_AMBULATORY_CARE_PROVIDER_SITE_OTHER): Payer: Medicare Other | Admitting: *Deleted

## 2013-11-16 DIAGNOSIS — I509 Heart failure, unspecified: Secondary | ICD-10-CM

## 2013-11-16 DIAGNOSIS — I428 Other cardiomyopathies: Secondary | ICD-10-CM

## 2013-11-16 DIAGNOSIS — I5022 Chronic systolic (congestive) heart failure: Secondary | ICD-10-CM

## 2013-11-16 LAB — MDC_IDC_ENUM_SESS_TYPE_REMOTE
Battery Remaining Longevity: 68 mo
Battery Remaining Percentage: 77 %
Battery Voltage: 2.98 V
Brady Statistic AP VP Percent: 7.4 %
Brady Statistic AP VS Percent: 1 %
Brady Statistic AS VS Percent: 1 %
HIGH POWER IMPEDANCE MEASURED VALUE: 69 Ohm
HighPow Impedance: 69 Ohm
Implantable Pulse Generator Model: 3265
Implantable Pulse Generator Serial Number: 7054987
Lead Channel Impedance Value: 450 Ohm
Lead Channel Pacing Threshold Amplitude: 0.75 V
Lead Channel Pacing Threshold Amplitude: 1 V
Lead Channel Pacing Threshold Pulse Width: 0.5 ms
Lead Channel Pacing Threshold Pulse Width: 0.5 ms
Lead Channel Sensing Intrinsic Amplitude: 3.9 mV
Lead Channel Setting Pacing Amplitude: 2 V
Lead Channel Setting Pacing Amplitude: 2 V
Lead Channel Setting Sensing Sensitivity: 0.5 mV
MDC IDC MSMT LEADCHNL LV IMPEDANCE VALUE: 900 Ohm
MDC IDC MSMT LEADCHNL LV PACING THRESHOLD AMPLITUDE: 0.875 V
MDC IDC MSMT LEADCHNL RA IMPEDANCE VALUE: 430 Ohm
MDC IDC MSMT LEADCHNL RV PACING THRESHOLD PULSEWIDTH: 0.5 ms
MDC IDC MSMT LEADCHNL RV SENSING INTR AMPL: 11.4 mV
MDC IDC SESS DTM: 20150727060018
MDC IDC SET LEADCHNL LV PACING AMPLITUDE: 2 V
MDC IDC SET LEADCHNL LV PACING PULSEWIDTH: 0.5 ms
MDC IDC SET LEADCHNL RV PACING PULSEWIDTH: 0.5 ms
MDC IDC STAT BRADY AS VP PERCENT: 93 %
MDC IDC STAT BRADY RA PERCENT PACED: 7.3 %
Zone Setting Detection Interval: 270 ms
Zone Setting Detection Interval: 350 ms

## 2013-11-16 NOTE — Patient Instructions (Signed)

## 2013-11-16 NOTE — Addendum Note (Signed)
Addended by: Alvis Lemmings C on: 11/16/2013 02:05 PM   Modules accepted: Level of Service

## 2013-11-16 NOTE — Progress Notes (Signed)
Remote ICD transmission.   

## 2013-11-16 NOTE — Progress Notes (Addendum)
EPIC Encounter for ICM Monitoring  Patient Name: Terry Peck is a 73 y.o. male Date: 11/16/2013 Primary Care Physican: Cathlean Cower, MD Primary Cardiologist: Nahser Electrophysiologist: Allred Dry Weight: 179 lbs  Bi-V pacing: >99%       In the past month, have you:  1. Gained more than 2 pounds in a day or more than 5 pounds in a week? no  2. Had changes in your medications (with verification of current medications)? no  3. Had more shortness of breath than is usual for you? no  4. Limited your activity because of shortness of breath? no  5. Not been able to sleep because of shortness of breath? no  6. Had increased swelling in your feet or ankles? no  7. Had symptoms of dehydration (dizziness, dry mouth, increased thirst, decreased urine output) no  8. Had changes in sodium restriction? no  9. Been compliant with medication? Yes   ICM trend:   Follow-up plan: ICM clinic phone appointment: 12/17/13. The patient's corvue reading has been up since 10/26/13. The patient reports no symptoms and no change in his weight (this may go up a pound per his report). Urine output has been normal. He has been working outside in his yard pretty regularly and drinking some extra fluids. He saw Dr. Acie Fredrickson on 11/02/13 and his in office weight was 188 lbs. Per Dr. Elmarie Shiley note, the patient had not taken his lasix for 2 days. No medication changes were made at that time. Since the patient is asymptomatic and has seen Dr. Acie Fredrickson during the window when his Corvue readings have been elevated, I explained I would forward to Dr. Acie Fredrickson to see if he feels the patient requires any additional lasix at this time. I will notify the patient if any changes are needed. The patient is agreeable.  Copy of note sent to patient's primary care physician, primary cardiologist, and device following physician.  Alvis Lemmings, RN, BSN 11/16/2013 1:55 PM

## 2013-11-26 ENCOUNTER — Encounter: Payer: Self-pay | Admitting: Cardiology

## 2013-11-28 ENCOUNTER — Other Ambulatory Visit: Payer: Self-pay | Admitting: Cardiovascular Disease

## 2013-12-01 ENCOUNTER — Encounter: Payer: Self-pay | Admitting: Internal Medicine

## 2013-12-02 ENCOUNTER — Encounter: Payer: Self-pay | Admitting: Internal Medicine

## 2013-12-02 MED ORDER — GLIPIZIDE 10 MG PO TABS: 10.0000 mg | ORAL_TABLET | Freq: Two times a day (BID) | ORAL | Status: DC

## 2013-12-09 ENCOUNTER — Encounter: Payer: Self-pay | Admitting: Internal Medicine

## 2013-12-09 ENCOUNTER — Ambulatory Visit (INDEPENDENT_AMBULATORY_CARE_PROVIDER_SITE_OTHER): Payer: Medicare Other | Admitting: Internal Medicine

## 2013-12-09 ENCOUNTER — Other Ambulatory Visit: Payer: Self-pay | Admitting: Internal Medicine

## 2013-12-09 ENCOUNTER — Other Ambulatory Visit (INDEPENDENT_AMBULATORY_CARE_PROVIDER_SITE_OTHER): Payer: Medicare Other

## 2013-12-09 VITALS — BP 134/80 | HR 64 | Temp 98.2°F | Ht 68.0 in | Wt 189.1 lb

## 2013-12-09 DIAGNOSIS — I10 Essential (primary) hypertension: Secondary | ICD-10-CM

## 2013-12-09 DIAGNOSIS — IMO0001 Reserved for inherently not codable concepts without codable children: Secondary | ICD-10-CM

## 2013-12-09 DIAGNOSIS — E1165 Type 2 diabetes mellitus with hyperglycemia: Secondary | ICD-10-CM

## 2013-12-09 DIAGNOSIS — Z23 Encounter for immunization: Secondary | ICD-10-CM

## 2013-12-09 DIAGNOSIS — E785 Hyperlipidemia, unspecified: Secondary | ICD-10-CM

## 2013-12-09 LAB — LIPID PANEL
CHOLESTEROL: 131 mg/dL (ref 0–200)
HDL: 53.1 mg/dL (ref >39.00)
LDL CALC: 52 mg/dL (ref 0–99)
NonHDL: 77.9
TRIGLYCERIDES: 129 mg/dL (ref 0.0–149.0)
Total CHOL/HDL Ratio: 2
VLDL: 25.8 mg/dL (ref 0.0–40.0)

## 2013-12-09 LAB — HEPATIC FUNCTION PANEL
ALBUMIN: 4.1 g/dL (ref 3.5–5.2)
ALT: 34 U/L (ref 0–53)
AST: 34 U/L (ref 0–37)
Alkaline Phosphatase: 33 U/L — ABNORMAL LOW (ref 39–117)
Bilirubin, Direct: 0.1 mg/dL (ref 0.0–0.3)
Total Bilirubin: 0.7 mg/dL (ref 0.2–1.2)
Total Protein: 7.9 g/dL (ref 6.0–8.3)

## 2013-12-09 LAB — BASIC METABOLIC PANEL
BUN: 16 mg/dL (ref 6–23)
CALCIUM: 9.6 mg/dL (ref 8.4–10.5)
CO2: 28 meq/L (ref 19–32)
Chloride: 102 mEq/L (ref 96–112)
Creatinine, Ser: 0.8 mg/dL (ref 0.4–1.5)
GFR: 95.09 mL/min (ref >60.00)
GLUCOSE: 123 mg/dL — AB (ref 70–99)
Potassium: 4.6 mEq/L (ref 3.5–5.1)
SODIUM: 139 meq/L (ref 135–145)

## 2013-12-09 LAB — HEMOGLOBIN A1C: Hgb A1c MFr Bld: 9.9 % — ABNORMAL HIGH (ref 4.6–6.5)

## 2013-12-09 MED ORDER — INSULIN GLARGINE 100 UNIT/ML SOLOSTAR PEN: 40.0000 [IU] | PEN_INJECTOR | Freq: Every day | SUBCUTANEOUS | Status: DC

## 2013-12-09 NOTE — Patient Instructions (Signed)

## 2013-12-09 NOTE — Assessment & Plan Note (Signed)
stable overall by history and exam, recent data reviewed with pt, and pt to continue medical treatment as before,  to f/u any worsening symptoms or concerns Lab Results  Component Value Date   HGBA1C 9.6* 04/10/2013

## 2013-12-09 NOTE — Progress Notes (Signed)
Pre visit review using our clinic review tool, if applicable. No additional management support is needed unless otherwise documented below in the visit note. 

## 2013-12-09 NOTE — Addendum Note (Signed)
Addended by: Sharon Seller B on: 12/09/2013 08:55 AM   Modules accepted: Orders

## 2013-12-09 NOTE — Assessment & Plan Note (Signed)
stable overall by history and exam, recent data reviewed with pt, and pt to continue medical treatment as before,  to f/u any worsening symptoms or concerns Lab Results  Component Value Date   LDLCALC 60 11/02/2013

## 2013-12-09 NOTE — Progress Notes (Signed)
Subjective:    Patient ID: Terry Peck, male    DOB: January 20, 1941, 73 y.o.   MRN: [id number]  HPI  Here to f/u; overall doing ok,  Pt denies chest pain, increased sob or doe, wheezing, orthopnea, PND, increased LE swelling, palpitations, dizziness or syncope.  Pt denies polydipsia, polyuria, or low sugar symptoms such as weakness or confusion improved with po intake.  Pt denies new neurological symptoms such as new headache, or facial or extremity weakness or numbness.   Pt states overall good compliance with meds, has been trying to follow lower cholesterol, diabetic diet, with wt overall stable,  but little exercise however   Completely retired now apr 1 from [place], job moved to [employer]. Working now 6 days per wk in the yard, 0630 to noon, doing Producer, television/film/video and patio work, and Biomedical scientist. CBGs not low, this am 128.   Wt Readings from Last 3 Encounters:  12/09/13 189 lb 2 oz (85.787 kg)  11/02/13 188 lb 9.6 oz (85.548 kg)  04/14/13 187 lb 6 oz (84.993 kg)   Past Medical History  Diagnosis Date  . Allergy   . GERD (gastroesophageal reflux disease)   . Hypertension   . Colon polyps 11/23/2010  . Barrett's esophagus 11/23/2010  . Abnormal ANCA test 07/03/2011  . Chronic systolic dysfunction of left ventricle     EF 30%  . Pulmonary fibrosis   . Pulmonary nodules   . Iron deficiency anemia 05/30/2011  . B12 deficiency anemia   . Bundle branch block, left   . ICD (implantable cardiac defibrillator) in place   . Shortness of breath     "related to heart being out of rhythm" (01/29/2012)  . Type II diabetes mellitus   . Arthritis     "mild; right shoulder" (01/29/2012)  . Liver abscess 1980's  . Nonischemic cardiomyopathy     s/p St. Jude BiV ICD 01/29/12  . Hyperlipidemia 10/14/2012   Past Surgical History  Procedure Laterality Date  . Tonsillectomy  1952  . Cardiac defibrillator placement  01/29/2012    SJM Quadra Assura BiV ICD implanted by Dr Rayann Heman  . Abscess drained  ?1980's      liver    reports that he quit smoking about 25 years ago. His smoking use included Cigarettes. He has a 5 pack-year smoking history. He has never used smokeless tobacco. He reports that he does not drink alcohol or use illicit drugs. family history includes Heart disease in his mother. There is no history of Colon cancer. Allergies  Allergen Reactions  . Penicillins Anaphylaxis and Other (See Comments)    "swelling of eyes; throat; could breath good" (01/29/2012)  . Iodine Other (See Comments)    "if I get iodine dye, I'll get a fever" (01/29/2012)   Current Outpatient Prescriptions on File Prior to Visit  Medication Sig Dispense Refill  . aspirin EC 81 MG tablet Take 81 mg by mouth daily.      Marland Kitchen atorvastatin (LIPITOR) 40 MG tablet TAKE ONE TABLET BY MOUTH ONCE DAILY  90 tablet  2  . Calcium-Magnesium-Zinc 1000-400-15 MG TABS Take 1 tablet by mouth daily.       . carvedilol (COREG) 25 MG tablet Take 1 tablet (25 mg total) by mouth 2 (two) times daily with a meal.  180 tablet  3  . Cholecalciferol (VITAMIN D) 1000 UNITS capsule Take 1,000 Units by mouth daily.       . ferrous fumarate (HEMOCYTE - 106 MG FE) 325 (106  FE) MG TABS Take 1 tablet by mouth daily.      . furosemide (LASIX) 40 MG tablet TAKE ONE TABLET BY MOUTH ONCE DAILY  30 tablet  3  . glipiZIDE (GLUCOTROL) 10 MG tablet Take 1 tablet (10 mg total) by mouth 2 (two) times daily before a meal.  60 tablet  11  . Glucose Blood (BAYER [name] 2 TEST) DISK Test four times daily as directed  200 each  11  . ibuprofen (ADVIL,MOTRIN) 200 MG tablet Take 200-300 mg by mouth 2 (two) times daily as needed. For pain      . Insulin Glargine (LANTUS SOLOSTAR) 100 UNIT/ML SOPN Inject 25 Units into the skin daily.  5 pen  PRN  . Insulin Pen Needle (BD PEN NEEDLE NANO U/F) 32G X 4 MM MISC 1 Units by Does not apply route daily.  100 each  5  . Lutein 20 MG CAPS Take 20 mg by mouth daily.      . metFORMIN (GLUCOPHAGE) 850 MG tablet TAKE ONE TABLET BY  MOUTH three times per day  270 tablet  3  . methocarbamol (ROBAXIN) 500 MG tablet Take 1 tablet (500 mg total) by mouth daily as needed.  30 tablet  1  . Multiple Vitamin (MULTIVITAMIN WITH MINERALS) TABS Take 1 tablet by mouth daily.      . OMEGA 3 1000 MG CAPS Take 1,000 mg by mouth 2 (two) times daily.       Marland Kitchen omeprazole (PRILOSEC) 20 MG capsule TAKE ONE CAPSULE BY MOUTH TWICE DAILY  180 capsule  2  . OVER THE COUNTER MEDICATION Take 5,000 mcg by mouth daily. Vitamin B 12 5000 mcg tablet      . pioglitazone (ACTOS) 45 MG tablet TAKE ONE TABLET BY MOUTH AT BEDTIME  90 tablet  3  . sitaGLIPtin (JANUVIA) 100 MG tablet TAKE ONE TABLET BY MOUTH EVERY DAY  30 tablet  7  . [DISCONTINUED] simvastatin (ZOCOR) 80 MG tablet Take 1 tablet (80 mg total) by mouth at bedtime.  90 tablet  3   No current facility-administered medications on file prior to visit.    Review of Systems  Constitutional: Negative for unusual diaphoresis or other sweats  HENT: Negative for ringing in ear Eyes: Negative for double vision or worsening visual disturbance.  Respiratory: Negative for choking and stridor.   Gastrointestinal: Negative for vomiting or other signifcant bowel change Genitourinary: Negative for hematuria or decreased urine volume.  Musculoskeletal: Negative for other MSK pain or swelling Skin: Negative for color change and worsening wound.  Neurological: Negative for tremors and numbness other than noted  Psychiatric/Behavioral: Negative for decreased concentration or agitation other than above       Objective:   Physical Exam BP 134/80  Pulse 64  Temp(Src) 98.2 F (36.8 C) (Oral)  Ht 5\' 8"  (1.727 m)  Wt 189 lb 2 oz (85.787 kg)  BMI 28.76 kg/m2  SpO2 94% VS noted,  Constitutional: Pt appears well-developed, well-nourished.  HENT: Head: NCAT.  Right Ear: External ear normal.  Left Ear: External ear normal.  Eyes: . Pupils are equal, round, and reactive to light. Conjunctivae and EOM are  normal Neck: Normal range of motion. Neck supple.  Cardiovascular: Normal rate and regular rhythm.   Pulmonary/Chest: Effort normal and breath sounds normal.  Abd:  Soft, NT, ND, + BS Neurological: Pt is alert. Not confused , motor grossly intact Skin: Skin is warm. No rash, no LE edema Psychiatric: Pt behavior is normal.  No agitation.     Assessment & Plan:

## 2013-12-09 NOTE — Assessment & Plan Note (Signed)
stable overall by history and exam, recent data reviewed with pt, and pt to continue medical treatment as before,  to f/u any worsening symptoms or concerns BP Readings from Last 3 Encounters:  12/09/13 134/80  11/02/13 172/80  04/14/13 132/80

## 2013-12-10 ENCOUNTER — Encounter: Payer: Self-pay | Admitting: Internal Medicine

## 2013-12-15 ENCOUNTER — Encounter: Payer: Self-pay | Admitting: Internal Medicine

## 2013-12-17 ENCOUNTER — Telehealth: Payer: Self-pay | Admitting: *Deleted

## 2013-12-17 ENCOUNTER — Encounter: Payer: Medicare Other | Admitting: *Deleted

## 2013-12-17 NOTE — Telephone Encounter (Signed)
ICM transmission received.I left a message at the patient's home # to call. Attempted cell #, but message received stating the # or code is incorrect.

## 2013-12-28 ENCOUNTER — Other Ambulatory Visit: Payer: Self-pay | Admitting: Internal Medicine

## 2013-12-30 ENCOUNTER — Encounter: Payer: Self-pay | Admitting: Internal Medicine

## 2013-12-30 MED ORDER — INSULIN GLARGINE 100 UNIT/ML SOLOSTAR PEN: PEN_INJECTOR | SUBCUTANEOUS | Status: DC

## 2014-01-08 ENCOUNTER — Encounter: Payer: Self-pay | Admitting: Internal Medicine

## 2014-01-11 ENCOUNTER — Other Ambulatory Visit: Payer: Self-pay

## 2014-01-11 MED ORDER — INSULIN GLARGINE 100 UNIT/ML SOLOSTAR PEN: 40.0000 [IU] | PEN_INJECTOR | Freq: Every day | SUBCUTANEOUS | Status: DC

## 2014-01-20 ENCOUNTER — Other Ambulatory Visit: Payer: Self-pay | Admitting: Internal Medicine

## 2014-01-25 ENCOUNTER — Encounter: Payer: Self-pay | Admitting: *Deleted

## 2014-02-04 ENCOUNTER — Other Ambulatory Visit: Payer: Self-pay

## 2014-02-08 ENCOUNTER — Ambulatory Visit (INDEPENDENT_AMBULATORY_CARE_PROVIDER_SITE_OTHER): Payer: Medicare Other | Admitting: Internal Medicine

## 2014-02-08 ENCOUNTER — Encounter: Payer: Self-pay | Admitting: Internal Medicine

## 2014-02-08 VITALS — BP 156/82 | HR 96 | Ht 68.0 in | Wt 191.8 lb

## 2014-02-08 DIAGNOSIS — I5022 Chronic systolic (congestive) heart failure: Secondary | ICD-10-CM

## 2014-02-08 DIAGNOSIS — I447 Left bundle-branch block, unspecified: Secondary | ICD-10-CM

## 2014-02-08 LAB — MDC_IDC_ENUM_SESS_TYPE_INCLINIC
Date Time Interrogation Session: 20151019200048
HighPow Impedance: 76.5 Ohm
Lead Channel Impedance Value: 512.5 Ohm
Lead Channel Impedance Value: 525 Ohm
Lead Channel Pacing Threshold Amplitude: 0.875 V
Lead Channel Pacing Threshold Pulse Width: 0.5 ms
Lead Channel Pacing Threshold Pulse Width: 0.5 ms
Lead Channel Sensing Intrinsic Amplitude: 11.4 mV
Lead Channel Setting Pacing Amplitude: 1.875
Lead Channel Setting Pacing Amplitude: 2 V
Lead Channel Setting Pacing Amplitude: 2 V
Lead Channel Setting Pacing Pulse Width: 0.5 ms
Lead Channel Setting Sensing Sensitivity: 0.5 mV
MDC IDC MSMT BATTERY REMAINING LONGEVITY: 67.2 mo
MDC IDC MSMT LEADCHNL LV IMPEDANCE VALUE: 1012.5 Ohm
MDC IDC MSMT LEADCHNL LV PACING THRESHOLD AMPLITUDE: 0.75 V
MDC IDC MSMT LEADCHNL LV PACING THRESHOLD PULSEWIDTH: 0.5 ms
MDC IDC MSMT LEADCHNL RA PACING THRESHOLD AMPLITUDE: 0.875 V
MDC IDC MSMT LEADCHNL RA SENSING INTR AMPL: 4.8 mV
MDC IDC PG MODEL: 3265
MDC IDC PG SERIAL: 7054987
MDC IDC SET LEADCHNL RV PACING PULSEWIDTH: 0.5 ms
MDC IDC SET ZONE DETECTION INTERVAL: 350 ms
MDC IDC STAT BRADY RA PERCENT PACED: 9.6 %
MDC IDC STAT BRADY RV PERCENT PACED: 99.89 %
Zone Setting Detection Interval: 270 ms

## 2014-02-08 NOTE — Progress Notes (Signed)
PCP: Cathlean Cower, MD Primary Cardiologist: Nahser  Terry Peck is a 73 y.o. male who presents today for routine electrophysiology followup.  Since last being seen in our clinic, the patient reports doing very well. He has normalized his EF post CRT implant.  He is very active and has been doing yard work all summer/ fall.  Today, he denies symptoms of palpitations, chest pain, shortness of breath,  lower extremity edema, dizziness, presyncope, syncope, or ICD shocks.  The patient is otherwise without complaint today.   Past Medical History  Diagnosis Date  . Allergy   . GERD (gastroesophageal reflux disease)   . Hypertension   . Colon polyps 11/23/2010  . Barrett's esophagus 11/23/2010  . Abnormal ANCA test 07/03/2011  . Chronic systolic dysfunction of left ventricle     EF 30%  . Pulmonary fibrosis   . Pulmonary nodules   . Iron deficiency anemia 05/30/2011  . B12 deficiency anemia   . Bundle branch block, left   . ICD (implantable cardiac defibrillator) in place   . Shortness of breath     "related to heart being out of rhythm" (01/29/2012)  . Type II diabetes mellitus   . Arthritis     "mild; right shoulder" (01/29/2012)  . Liver abscess 1980's  . Nonischemic cardiomyopathy     s/p St. Jude BiV ICD 01/29/12  . Hyperlipidemia 10/14/2012   Past Surgical History  Procedure Laterality Date  . Tonsillectomy  1952  . Cardiac defibrillator placement  01/29/2012    SJM Quadra Assura BiV ICD implanted by Dr Rayann Heman  . Abscess drained  ?1980's    liver    Current Outpatient Prescriptions  Medication Sig Dispense Refill  . aspirin EC 81 MG tablet Take 81 mg by mouth daily.      Marland Kitchen atorvastatin (LIPITOR) 40 MG tablet TAKE ONE TABLET BY MOUTH ONCE DAILY  90 tablet  2  . Calcium-Magnesium-Zinc 1000-400-15 MG TABS Take 1 tablet by mouth daily.       . carvedilol (COREG) 25 MG tablet Take 1 tablet (25 mg total) by mouth 2 (two) times daily with a meal.  180 tablet  3  . Cholecalciferol  (VITAMIN D) 1000 UNITS capsule Take 1,000 Units by mouth daily.       . ferrous fumarate (HEMOCYTE - 106 MG FE) 325 (106 FE) MG TABS Take 1 tablet by mouth daily.      . furosemide (LASIX) 40 MG tablet TAKE ONE TABLET BY MOUTH ONCE DAILY  30 tablet  3  . Glucose Blood (BAYER [name] 2 TEST) DISK Test four times daily as directed  200 each  11  . ibuprofen (ADVIL,MOTRIN) 200 MG tablet Take 200-300 mg by mouth 2 (two) times daily as needed. For pain      . Insulin Glargine (LANTUS SOLOSTAR) 100 UNIT/ML Solostar Pen Inject 40 Units into the skin daily.  3 pen  3  . Insulin Pen Needle (BD PEN NEEDLE NANO U/F) 32G X 4 MM MISC USE ONE PEN NEEDLE SUBCUTANEOUSLY EVERY DAY DX 250.02  100 each  3  . Lutein 20 MG CAPS Take 20 mg by mouth daily.      . metFORMIN (GLUCOPHAGE) 850 MG tablet TAKE ONE TABLET BY MOUTH three times per day  270 tablet  3  . methocarbamol (ROBAXIN) 500 MG tablet Take 1 tablet (500 mg total) by mouth daily as needed.  30 tablet  1  . Multiple Vitamin (MULTIVITAMIN WITH MINERALS) TABS Take 1  tablet by mouth daily.      . OMEGA 3 1000 MG CAPS Take 1,000 mg by mouth 2 (two) times daily.       Marland Kitchen omeprazole (PRILOSEC) 20 MG capsule TAKE ONE CAPSULE BY MOUTH TWICE DAILY  180 capsule  2  . OVER THE COUNTER MEDICATION Take 5,000 mcg by mouth daily. Vitamin B 12 5000 mcg tablet      . pioglitazone (ACTOS) 45 MG tablet TAKE ONE TABLET BY MOUTH AT BEDTIME  90 tablet  3  . sitaGLIPtin (JANUVIA) 100 MG tablet TAKE ONE TABLET BY MOUTH EVERY DAY  30 tablet  7  . [DISCONTINUED] simvastatin (ZOCOR) 80 MG tablet Take 1 tablet (80 mg total) by mouth at bedtime.  90 tablet  3   No current facility-administered medications for this visit.    Physical Exam: Filed Vitals:   02/08/14 1548  BP: 156/82  Pulse: 96  Height: 5\' 8"  (1.727 m)  Weight: 191 lb 12.8 oz (87 kg)    GEN- The patient is well appearing, alert and oriented x 3 today.   Head- normocephalic, atraumatic Eyes-  Sclera clear,  conjunctiva pink Ears- hearing intact Oropharynx- clear Lungs- Clear to ausculation bilaterally, normal work of breathing Chest- ICD pocket is well healed Heart- Regular rate and rhythm, no murmurs, rubs or gallops, PMI not laterally displaced GI- soft, NT, ND, + BS Extremities- no clubbing, cyanosis, or edema  ICD interrogation- reviewed in detail today,  See PACEART report ekg today reveals sinus rhythm with BiV pacing  Assessment and Plan:  1.  Chronic systolic dysfunction euvolemic today Stable on an appropriate medical regimen Normal ICD function See Pace Art report No changes today Followed in the ICM device clinic  2. Hypertensive cardiovascular disease BP is elevated today He reports good control at home No changes today   [name]  Return in 1 year

## 2014-02-08 NOTE — Patient Instructions (Signed)
Remote monitoring is used to monitor your ICD from home. This monitoring reduces the number of office visits required to check your device to one time per year. It allows [place] to keep an eye on the functioning of your device to ensure it is working properly. You are scheduled for a device check from home on 05/12/2014. You may send your transmission at any time that day. If you have a wireless device, the transmission will be sent automatically. After your physician reviews your transmission, you will receive a postcard with your next transmission date.  Your physician recommends that you schedule a follow-up appointment in: 12 months with Dr.Allred

## 2014-03-11 ENCOUNTER — Encounter: Payer: Medicare Other | Admitting: *Deleted

## 2014-03-12 ENCOUNTER — Other Ambulatory Visit (INDEPENDENT_AMBULATORY_CARE_PROVIDER_SITE_OTHER): Payer: Medicare Other

## 2014-03-12 ENCOUNTER — Ambulatory Visit (INDEPENDENT_AMBULATORY_CARE_PROVIDER_SITE_OTHER): Payer: Medicare Other | Admitting: Internal Medicine

## 2014-03-12 ENCOUNTER — Encounter: Payer: Self-pay | Admitting: Internal Medicine

## 2014-03-12 VITALS — BP 138/84 | HR 73 | Temp 97.6°F | Ht 68.0 in | Wt 198.0 lb

## 2014-03-12 DIAGNOSIS — E785 Hyperlipidemia, unspecified: Secondary | ICD-10-CM

## 2014-03-12 DIAGNOSIS — E119 Type 2 diabetes mellitus without complications: Secondary | ICD-10-CM

## 2014-03-12 DIAGNOSIS — Z0000 Encounter for general adult medical examination without abnormal findings: Secondary | ICD-10-CM

## 2014-03-12 DIAGNOSIS — R911 Solitary pulmonary nodule: Secondary | ICD-10-CM | POA: Insufficient documentation

## 2014-03-12 DIAGNOSIS — I10 Essential (primary) hypertension: Secondary | ICD-10-CM

## 2014-03-12 DIAGNOSIS — Z125 Encounter for screening for malignant neoplasm of prostate: Secondary | ICD-10-CM

## 2014-03-12 LAB — BASIC METABOLIC PANEL
BUN: 19 mg/dL (ref 6–23)
CALCIUM: 9.8 mg/dL (ref 8.4–10.5)
CO2: 26 mEq/L (ref 19–32)
Chloride: 104 mEq/L (ref 96–112)
Creatinine, Ser: 0.8 mg/dL (ref 0.4–1.5)
GFR: 99.09 mL/min (ref >60.00)
Glucose, Bld: 112 mg/dL — ABNORMAL HIGH (ref 70–99)
POTASSIUM: 5 meq/L (ref 3.5–5.1)
SODIUM: 139 meq/L (ref 135–145)

## 2014-03-12 LAB — CBC WITH DIFFERENTIAL/PLATELET
Basophils Absolute: 0 10*3/uL (ref 0.0–0.1)
Basophils Relative: 0.5 % (ref 0.0–3.0)
Eosinophils Absolute: 0.3 10*3/uL (ref 0.0–0.7)
Eosinophils Relative: 4.1 % (ref 0.0–5.0)
HEMATOCRIT: 39.6 % (ref 39.0–52.0)
HEMOGLOBIN: 12.9 g/dL — AB (ref 13.0–17.0)
LYMPHS ABS: 2 10*3/uL (ref 0.7–4.0)
Lymphocytes Relative: 31.9 % (ref 12.0–46.0)
MCHC: 32.7 g/dL (ref 30.0–36.0)
MCV: 82.1 fl (ref 78.0–100.0)
Monocytes Absolute: 0.6 10*3/uL (ref 0.1–1.0)
Monocytes Relative: 10 % (ref 3.0–12.0)
NEUTROS ABS: 3.4 10*3/uL (ref 1.4–7.7)
Neutrophils Relative %: 53.5 % (ref 43.0–77.0)
Platelets: 278 10*3/uL (ref 150.0–400.0)
RBC: 4.82 Mil/uL (ref 4.22–5.81)
RDW: 18.2 % — ABNORMAL HIGH (ref 11.5–15.5)
WBC: 6.4 10*3/uL (ref 4.0–10.5)

## 2014-03-12 LAB — URINALYSIS, ROUTINE W REFLEX MICROSCOPIC
Bilirubin Urine: NEGATIVE
Hgb urine dipstick: NEGATIVE
KETONES UR: NEGATIVE
LEUKOCYTES UA: NEGATIVE
Nitrite: NEGATIVE
PH: 8 (ref 5.0–8.0)
Specific Gravity, Urine: 1.015 (ref 1.000–1.030)
Total Protein, Urine: NEGATIVE
Urine Glucose: NEGATIVE
Urobilinogen, UA: 0.2 (ref 0.0–1.0)
WBC UA: NONE SEEN (ref 0–?)

## 2014-03-12 LAB — HEPATIC FUNCTION PANEL
ALBUMIN: 4.3 g/dL (ref 3.5–5.2)
ALT: 31 U/L (ref 0–53)
AST: 30 U/L (ref 0–37)
Alkaline Phosphatase: 38 U/L — ABNORMAL LOW (ref 39–117)
Bilirubin, Direct: 0.1 mg/dL (ref 0.0–0.3)
Total Bilirubin: 0.6 mg/dL (ref 0.2–1.2)
Total Protein: 7.4 g/dL (ref 6.0–8.3)

## 2014-03-12 LAB — MICROALBUMIN / CREATININE URINE RATIO
Creatinine,U: 50.6 mg/dL
MICROALB UR: 0.9 mg/dL (ref 0.0–1.9)
Microalb Creat Ratio: 1.8 mg/g (ref 0.0–30.0)

## 2014-03-12 LAB — LIPID PANEL
CHOL/HDL RATIO: 2
Cholesterol: 118 mg/dL (ref 0–200)
HDL: 51 mg/dL (ref >39.00)
LDL CALC: 50 mg/dL (ref 0–99)
NONHDL: 67
Triglycerides: 83 mg/dL (ref 0.0–149.0)
VLDL: 16.6 mg/dL (ref 0.0–40.0)

## 2014-03-12 LAB — PSA: PSA: 0.21 ng/mL (ref 0.10–4.00)

## 2014-03-12 LAB — TSH: TSH: 3.76 u[IU]/mL (ref 0.35–4.50)

## 2014-03-12 LAB — HEMOGLOBIN A1C: Hgb A1c MFr Bld: 7.7 % — ABNORMAL HIGH (ref 4.6–6.5)

## 2014-03-12 NOTE — Assessment & Plan Note (Signed)

## 2014-03-12 NOTE — Patient Instructions (Signed)

## 2014-03-12 NOTE — Assessment & Plan Note (Signed)
stable overall by history and exam, recent data reviewed with pt, and pt to continue medical treatment as before,  to f/u any worsening symptoms or concerns Lab Results  Component Value Date   HGBA1C 9.9* 12/09/2013

## 2014-03-12 NOTE — Progress Notes (Signed)
Subjective:    Patient ID: Terry Peck, male    DOB: 10-25-1940, 73 y.o.   MRN: [id number]  HPI  Here for wellness and f/u;  Overall doing ok;  Pt denies CP, worsening SOB, DOE, wheezing, orthopnea, PND, worsening LE edema, palpitations, dizziness or syncope.  Pt denies neurological change such as new headache, facial or extremity weakness.  Pt denies polydipsia, polyuria, or low sugar symptoms. Pt states overall good compliance with treatment and medications, good tolerability, and has been trying to follow lower cholesterol diet.  Pt denies worsening depressive symptoms, suicidal ideation or panic. No fever, night sweats, wt loss, loss of appetite, or other constitutional symptoms.  Pt states good ability with ADL's, has low fall risk, home safety reviewed and adequate, no other significant changes in hearing or vision, and only occasionally active with exercise. CBG's in low 100's, no lows per pt on current meds.  Last a1c over 9 in aug 2015. Past Medical History  Diagnosis Date  . Allergy   . GERD (gastroesophageal reflux disease)   . Hypertension   . Colon polyps 11/23/2010  . Barrett's esophagus 11/23/2010  . Abnormal ANCA test 07/03/2011  . Chronic systolic dysfunction of left ventricle     EF 30%  . Pulmonary fibrosis   . Pulmonary nodules   . Iron deficiency anemia 05/30/2011  . B12 deficiency anemia   . Bundle branch block, left   . ICD (implantable cardiac defibrillator) in place   . Shortness of breath     "related to heart being out of rhythm" (01/29/2012)  . Type II diabetes mellitus   . Arthritis     "mild; right shoulder" (01/29/2012)  . Liver abscess 1980's  . Nonischemic cardiomyopathy     s/p St. Jude BiV ICD 01/29/12  . Hyperlipidemia 10/14/2012   Past Surgical History  Procedure Laterality Date  . Tonsillectomy  1952  . Cardiac defibrillator placement  01/29/2012    SJM Quadra Assura BiV ICD implanted by Dr Rayann Heman  . Abscess drained  ?1980's    liver    reports  that he quit smoking about 25 years ago. His smoking use included Cigarettes. He has a 5 pack-year smoking history. He has never used smokeless tobacco. He reports that he does not drink alcohol or use illicit drugs. family history includes Heart disease in his mother. There is no history of Colon cancer. Allergies  Allergen Reactions  . Penicillins Anaphylaxis and Other (See Comments)    "swelling of eyes; throat; could breath good" (01/29/2012)  . Iodine Other (See Comments)    "if I get iodine dye, I'll get a fever" (01/29/2012)   Current Outpatient Prescriptions on File Prior to Visit  Medication Sig Dispense Refill  . aspirin EC 81 MG tablet Take 81 mg by mouth daily.    Marland Kitchen atorvastatin (LIPITOR) 40 MG tablet TAKE ONE TABLET BY MOUTH ONCE DAILY 90 tablet 2  . Calcium-Magnesium-Zinc 1000-400-15 MG TABS Take 1 tablet by mouth daily.     . carvedilol (COREG) 25 MG tablet Take 1 tablet (25 mg total) by mouth 2 (two) times daily with a meal. 180 tablet 3  . Cholecalciferol (VITAMIN D) 1000 UNITS capsule Take 1,000 Units by mouth daily.     . ferrous fumarate (HEMOCYTE - 106 MG FE) 325 (106 FE) MG TABS Take 1 tablet by mouth daily.    . furosemide (LASIX) 40 MG tablet TAKE ONE TABLET BY MOUTH ONCE DAILY 30 tablet 3  .  Glucose Blood (BAYER [name] 2 TEST) DISK Test four times daily as directed 200 each 11  . ibuprofen (ADVIL,MOTRIN) 200 MG tablet Take 200-300 mg by mouth 2 (two) times daily as needed. For pain    . Insulin Glargine (LANTUS SOLOSTAR) 100 UNIT/ML Solostar Pen Inject 40 Units into the skin daily. 3 pen 3  . Insulin Pen Needle (BD PEN NEEDLE NANO U/F) 32G X 4 MM MISC USE ONE PEN NEEDLE SUBCUTANEOUSLY EVERY DAY DX 250.02 100 each 3  . Lutein 20 MG CAPS Take 20 mg by mouth daily.    . metFORMIN (GLUCOPHAGE) 850 MG tablet TAKE ONE TABLET BY MOUTH three times per day 270 tablet 3  . methocarbamol (ROBAXIN) 500 MG tablet Take 1 tablet (500 mg total) by mouth daily as needed. 30 tablet 1  .  Multiple Vitamin (MULTIVITAMIN WITH MINERALS) TABS Take 1 tablet by mouth daily.    . OMEGA 3 1000 MG CAPS Take 1,000 mg by mouth 2 (two) times daily.     Marland Kitchen omeprazole (PRILOSEC) 20 MG capsule TAKE ONE CAPSULE BY MOUTH TWICE DAILY 180 capsule 2  . OVER THE COUNTER MEDICATION Take 5,000 mcg by mouth daily. Vitamin B 12 5000 mcg tablet    . pioglitazone (ACTOS) 45 MG tablet TAKE ONE TABLET BY MOUTH AT BEDTIME 90 tablet 3  . sitaGLIPtin (JANUVIA) 100 MG tablet TAKE ONE TABLET BY MOUTH EVERY DAY 30 tablet 7  . [DISCONTINUED] simvastatin (ZOCOR) 80 MG tablet Take 1 tablet (80 mg total) by mouth at bedtime. 90 tablet 3   No current facility-administered medications on file prior to visit.   Review of Systems Constitutional: Negative for increased diaphoresis, other activity, appetite or other siginficant weight change  HENT: Negative for worsening hearing loss, ear pain, facial swelling, mouth sores and neck stiffness.   Eyes: Negative for other worsening pain, redness or visual disturbance.  Respiratory: Negative for shortness of breath and wheezing.   Cardiovascular: Negative for chest pain and palpitations.  Gastrointestinal: Negative for diarrhea, blood in stool, abdominal distention or other pain Genitourinary: Negative for hematuria, flank pain or change in urine volume.  Musculoskeletal: Negative for myalgias or other joint complaints.  Skin: Negative for color change and wound.  Neurological: Negative for syncope and numbness. other than noted Hematological: Negative for adenopathy. or other swelling Psychiatric/Behavioral: Negative for hallucinations, self-injury, decreased concentration or other worsening agitation.      Objective:   Physical Exam        Assessment & Plan:

## 2014-03-12 NOTE — Progress Notes (Signed)
Pre visit review using our clinic review tool, if applicable. No additional management support is needed unless otherwise documented below in the visit note. 

## 2014-03-12 NOTE — Assessment & Plan Note (Signed)
decliens f/u ct at this time

## 2014-03-15 ENCOUNTER — Other Ambulatory Visit: Payer: Self-pay | Admitting: Internal Medicine

## 2014-03-15 MED ORDER — INSULIN GLARGINE 100 UNIT/ML SOLOSTAR PEN: 44.0000 [IU] | PEN_INJECTOR | Freq: Every day | SUBCUTANEOUS | Status: DC

## 2014-03-16 ENCOUNTER — Telehealth: Payer: Self-pay | Admitting: *Deleted

## 2014-03-16 ENCOUNTER — Other Ambulatory Visit: Payer: Self-pay

## 2014-03-16 NOTE — Telephone Encounter (Signed)
ICM transmission received. I left a message at the patient's home # to call. I called the cell # on file in the patient's chart- # incorrect- was told by the person answering to quit calling their # [phone].

## 2014-03-29 ENCOUNTER — Encounter: Payer: Self-pay | Admitting: *Deleted

## 2014-03-29 NOTE — Telephone Encounter (Signed)
No call back from the patient. Letter mailed with next transmission date.

## 2014-04-01 ENCOUNTER — Encounter (HOSPITAL_COMMUNITY): Payer: Self-pay | Admitting: Internal Medicine

## 2014-05-04 NOTE — Progress Notes (Signed)
Terry Peck Date of Birth  [birth year] [place] [address]    [place]   [place], [place]  [address]    [place], [place]  [address] [phone]  Fax  [phone]  [phone]  Fax [phone]  Problem list: 1. Left bundle branch block 2. Diabetes mellitus 3. Hyperlipidemia 4. Hypertension 5. Congestive heart failure-EF equals 30-35% 6. Bi-V ICD placement  History of Present Illness:  Terry Peck is a 74 yo who presents for evaluation of dyspnea.  He has been found to have a LBBB and has CHF with an EF of 30-35%.  He gets dyspneac with exercise ( mowing the lawn).  He denies any PND or orthopnea. He describes generalized fatigue with exertion.  The shortness of breath has been present for about a year. He admits to eating a fair amount of salty foods. He eats hot dogs, ham, bacon, sausage. He does not use any exercise but instead uses potassium chloride ( No salt).  He was seen by his medical doctor and was found to have a left bundle branch block. and was referred here for further evaluation. It was at that point that he had an echocardiogram which revealed global left ventricular systolic dysfunction with an EF of 30-35%.  He had a stress Myoview study which reveals an ejection fraction 29%. There is global left ventricular dilatation. There is no evidence of ischemia. His symptoms have improved dramatically since we've stopped his verapamil and start him on carvedilol.  Continue to gradually titrate up his medications. We change his HCTZ to Lasix during his last visit. We also added lisinopril. He's feeling better.  He is able to do more of his normal activities without severe dyspnea.  December 10, 2011 He continues to gradually and steadily improved. He is now able to mow his lawn and edge  without any difficulties. He's doing quite well at cardiac rehabilitation to  October 17, 2012:  Terry Peck is doing great.  He has definitely  seen a benefit from the BI-V pacer / ICD.  He's able to do all of his yard work without significant difficulty or shortness of breath.  No chest pain.  We have not done a cardiac cath - he was not found to have significant ischemia on myoview and he has made tremendous progress with medical therapy for his CHF.   Echo in April , 2014 Study Conclusions  - Left ventricle: The cavity size was normal. Wall thickness was increased in a pattern of severe LVH. Systolic function was normal. The estimated ejection fraction was 55%. Wall motion was normal; there were no regional wall motion abnormalities. - Left atrium: The atrium was mildly dilated. - Atrial septum: No defect or patent foramen ovale was identified.  His LV EF has normalized with medical therapy and the ICD.   Dec. 2, 2014:  Terry Peck is doing well.  Exercising well.  No CP, no dyspnea.    November 02, 2013:  Terry Peck is doing well. He did not take his furosemide yesterday or today and so his blood pressure is a little bit higher. He takes his blood pressure on a regular basis at home and his readings are in the normal range.  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 13, 2016 Terry Peck is a 74 yo with hx of chronic systolic CHF.  He is s/p Bi -  V  ICD placement.   His EF has normalized since placement of the Bi-V ICD.  He is feeling much better. Exercising and doing all that he can do.   He now has a Optivol ( or equivalent)  Feature enabled .  Volume status is good.    Current Outpatient Prescriptions on File Prior to Visit  Medication Sig Dispense Refill  . aspirin EC 81 MG tablet Take 81 mg by mouth daily.    Marland Kitchen atorvastatin (LIPITOR) 40 MG tablet TAKE ONE TABLET BY MOUTH ONCE DAILY 90 tablet 2  . Calcium-Magnesium-Zinc 1000-400-15 MG TABS Take 1 tablet by mouth daily.     . carvedilol (COREG) 25 MG tablet Take 1 tablet (25 mg total) by mouth 2 (two) times daily with a meal. 180  tablet 3  . Cholecalciferol (VITAMIN D) 1000 UNITS capsule Take 1,000 Units by mouth daily.     . ferrous fumarate (HEMOCYTE - 106 MG FE) 325 (106 FE) MG TABS Take 1 tablet by mouth daily.    . furosemide (LASIX) 40 MG tablet TAKE ONE TABLET BY MOUTH ONCE DAILY 30 tablet 3  . Glucose Blood (BAYER [name] 2 TEST) DISK Test four times daily as directed 200 each 11  . ibuprofen (ADVIL,MOTRIN) 200 MG tablet Take 200-300 mg by mouth 2 (two) times daily as needed. For pain    . Insulin Glargine (LANTUS SOLOSTAR) 100 UNIT/ML Solostar Pen Inject 44 Units into the skin daily. 3 pen 3  . Insulin Pen Needle (BD PEN NEEDLE NANO U/F) 32G X 4 MM MISC USE ONE PEN NEEDLE SUBCUTANEOUSLY EVERY DAY DX 250.02 100 each 3  . Lutein 20 MG CAPS Take 20 mg by mouth daily.    . metFORMIN (GLUCOPHAGE) 850 MG tablet TAKE ONE TABLET BY MOUTH three times per day 270 tablet 3  . methocarbamol (ROBAXIN) 500 MG tablet Take 1 tablet (500 mg total) by mouth daily as needed. 30 tablet 1  . Multiple Vitamin (MULTIVITAMIN WITH MINERALS) TABS Take 1 tablet by mouth daily.    . OMEGA 3 1000 MG CAPS Take 1,000 mg by mouth 2 (two) times daily.     Marland Kitchen omeprazole (PRILOSEC) 20 MG capsule TAKE ONE CAPSULE BY MOUTH TWICE DAILY 180 capsule 2  . OVER THE COUNTER MEDICATION Take 5,000 mcg by mouth daily. Vitamin B 12 5000 mcg tablet    . pioglitazone (ACTOS) 45 MG tablet TAKE ONE TABLET BY MOUTH AT BEDTIME 90 tablet 3  . sitaGLIPtin (JANUVIA) 100 MG tablet TAKE ONE TABLET BY MOUTH EVERY DAY 30 tablet 7  . [DISCONTINUED] simvastatin (ZOCOR) 80 MG tablet Take 1 tablet (80 mg total) by mouth at bedtime. 90 tablet 3   No current facility-administered medications on file prior to visit.    Allergies  Allergen Reactions  . Penicillins Anaphylaxis and Other (See Comments)    "swelling of eyes; throat; could breath good" (01/29/2012)  . Iodine Other (See Comments)    "if I get iodine dye, I'll get a fever" (01/29/2012)    Past Medical History   Diagnosis Date  . Allergy   . GERD (gastroesophageal reflux disease)   . Hypertension   . Colon polyps 11/23/2010  . Barrett's esophagus 11/23/2010  . Abnormal ANCA test 07/03/2011  . Chronic systolic dysfunction of left ventricle     EF 30%  . Pulmonary fibrosis   . Pulmonary nodules   . Iron deficiency anemia 05/30/2011  . B12 deficiency anemia   . Bundle branch block,  left   . ICD (implantable cardiac defibrillator) in place   . Shortness of breath     "related to heart being out of rhythm" (01/29/2012)  . Type II diabetes mellitus   . Arthritis     "mild; right shoulder" (01/29/2012)  . Liver abscess 1980's  . Nonischemic cardiomyopathy     s/p St. Jude BiV ICD 01/29/12  . Hyperlipidemia 10/14/2012    Past Surgical History  Procedure Laterality Date  . Tonsillectomy  1952  . Cardiac defibrillator placement  01/29/2012    SJM Quadra Assura BiV ICD implanted by Dr Rayann Heman  . Abscess drained  ?1980's    liver  . Bi-ventricular implantable cardioverter defibrillator N/A 01/29/2012    Procedure: BI-VENTRICULAR IMPLANTABLE CARDIOVERTER DEFIBRILLATOR  (CRT-D);  Surgeon: Thompson Grayer, MD;  Location: [facility] CATH LAB;  Service: Cardiovascular;  Laterality: N/A;    History  Smoking status  . Former Smoker -- 0.50 packs/day for 10 years  . Types: Cigarettes  . Quit date: 04/23/1988  Smokeless tobacco  . Never Used    History  Alcohol Use No    Family History  Problem Relation Age of Onset  . Heart disease Mother   . Colon cancer Neg Hx     Reviw of Systems:  Reviewed in the HPI.  All other systems are negative.  Physical Exam: There were no vitals taken for this visit. General: Well developed, well nourished, in no acute distress.  Head: Normocephalic, atraumatic, sclera non-icteric, mucus membranes are moist,   Neck: Supple. Carotids are 2 + without bruits. No JVD  Lungs: clear  Heart: regular rate.  normal  S1 S2. No murmurs, gallops or rubs.  Abdomen: Soft,  non-tender, non-distended with normal bowel sounds. No hepatomegaly. No rebound/guarding. No masses.  Msk:  Strength and tone are normal  Extremities: No clubbing or cyanosis. No edema.  Distal pedal pulses are 2+ and equal bilaterally.  Neuro: Alert and oriented X 3. Moves all extremities spontaneously.  Psych:  Responds to questions appropriately with a normal affect.  ECG: 05/05/2004 HEENT: Normal sinus rhythm at 65. He has biventricular pacing. Assessment / Plan:

## 2014-05-05 ENCOUNTER — Ambulatory Visit (INDEPENDENT_AMBULATORY_CARE_PROVIDER_SITE_OTHER): Payer: Medicare Other | Admitting: Cardiovascular Disease

## 2014-05-05 ENCOUNTER — Encounter: Payer: Self-pay | Admitting: Cardiovascular Disease

## 2014-05-05 VITALS — BP 142/82 | HR 65 | Ht 68.0 in | Wt 198.0 lb

## 2014-05-05 DIAGNOSIS — E785 Hyperlipidemia, unspecified: Secondary | ICD-10-CM

## 2014-05-05 DIAGNOSIS — I447 Left bundle-branch block, unspecified: Secondary | ICD-10-CM

## 2014-05-05 DIAGNOSIS — I119 Hypertensive heart disease without heart failure: Secondary | ICD-10-CM | POA: Diagnosis not present

## 2014-05-05 DIAGNOSIS — I5022 Chronic systolic (congestive) heart failure: Secondary | ICD-10-CM

## 2014-05-05 NOTE — Assessment & Plan Note (Signed)
Terry Peck seems to be doing quite well. His left ventricular systolic function has improved with the biventricular pacer. Continue current medicines. He'll continue to exercise and watch his diet

## 2014-05-05 NOTE — Assessment & Plan Note (Signed)
Lipids look great. Continue current meds.  Will check labs at next visit

## 2014-05-05 NOTE — Assessment & Plan Note (Signed)
S/p bi - V pacer

## 2014-05-05 NOTE — Patient Instructions (Signed)

## 2014-05-12 ENCOUNTER — Encounter: Payer: Self-pay | Admitting: Internal Medicine

## 2014-05-12 ENCOUNTER — Ambulatory Visit (INDEPENDENT_AMBULATORY_CARE_PROVIDER_SITE_OTHER): Payer: Medicare Other | Admitting: *Deleted

## 2014-05-12 ENCOUNTER — Telehealth: Payer: Self-pay | Admitting: Cardiology

## 2014-05-12 DIAGNOSIS — Z95810 Presence of automatic (implantable) cardiac defibrillator: Secondary | ICD-10-CM

## 2014-05-12 DIAGNOSIS — I5022 Chronic systolic (congestive) heart failure: Secondary | ICD-10-CM | POA: Diagnosis not present

## 2014-05-12 LAB — MDC_IDC_ENUM_SESS_TYPE_REMOTE
Battery Remaining Longevity: 66 mo
Battery Remaining Percentage: 71 %
Brady Statistic AP VP Percent: 8.6 %
Brady Statistic AP VS Percent: 1 %
Brady Statistic AS VP Percent: 91 %
Brady Statistic AS VS Percent: 1 %
Brady Statistic RA Percent Paced: 8.6 %
Date Time Interrogation Session: 20160120161704
HIGH POWER IMPEDANCE MEASURED VALUE: 74 Ohm
HighPow Impedance: 74 Ohm
Implantable Pulse Generator Serial Number: 7054987
Lead Channel Impedance Value: 1050 Ohm
Lead Channel Impedance Value: 530 Ohm
Lead Channel Pacing Threshold Amplitude: 0.875 V
Lead Channel Pacing Threshold Amplitude: 0.875 V
Lead Channel Pacing Threshold Amplitude: 1.125 V
Lead Channel Pacing Threshold Pulse Width: 0.5 ms
Lead Channel Pacing Threshold Pulse Width: 0.5 ms
Lead Channel Sensing Intrinsic Amplitude: 11.4 mV
Lead Channel Setting Pacing Amplitude: 2 V
Lead Channel Setting Pacing Amplitude: 2 V
Lead Channel Setting Pacing Pulse Width: 0.5 ms
Lead Channel Setting Sensing Sensitivity: 0.5 mV
MDC IDC MSMT BATTERY VOLTAGE: 2.96 V
MDC IDC MSMT LEADCHNL LV PACING THRESHOLD PULSEWIDTH: 0.5 ms
MDC IDC MSMT LEADCHNL RA SENSING INTR AMPL: 4.4 mV
MDC IDC MSMT LEADCHNL RV IMPEDANCE VALUE: 510 Ohm
MDC IDC PG MODEL: 3265
MDC IDC SET LEADCHNL RA PACING AMPLITUDE: 2.125
MDC IDC SET LEADCHNL RV PACING PULSEWIDTH: 0.5 ms
Zone Setting Detection Interval: 270 ms
Zone Setting Detection Interval: 350 ms

## 2014-05-12 NOTE — Telephone Encounter (Signed)
Spoke with pt and reminded pt of remote transmission that is due today. Pt verbalized understanding.   

## 2014-05-12 NOTE — Progress Notes (Signed)
Remote ICD transmission.   

## 2014-05-13 ENCOUNTER — Encounter: Payer: Self-pay | Admitting: *Deleted

## 2014-05-13 NOTE — Addendum Note (Signed)
Addended by: Alvis Lemmings C on: 05/13/2014 09:53 AM   Modules accepted: Level of Service

## 2014-05-13 NOTE — Progress Notes (Addendum)
EPIC Encounter for ICM Monitoring  Patient Name: Terry Peck is a 74 y.o. male Date: 05/13/2014 Primary Care Physican: Cathlean Cower, MD Primary Cardiologist: Nahser Electrophysiologist: Allred Dry Weight: 189 lbs  Bi-V pacing: >99%       In the past month, have you:  1. Gained more than 2 pounds in a day or more than 5 pounds in a week? no  2. Had changes in your medications (with verification of current medications)? no  3. Had more shortness of breath than is usual for you? no  4. Limited your activity because of shortness of breath? no  5. Not been able to sleep because of shortness of breath? no  6. Had increased swelling in your feet or ankles? no  7. Had symptoms of dehydration (dizziness, dry mouth, increased thirst, decreased urine output) no  8. Had changes in sodium restriction? no  9. Been compliant with medication? Yes   ICM trend:   Follow-up plan: ICM clinic phone appointment: 06/14/14  Copy of note sent to patient's primary care physician, primary cardiologist, and device following physician.  Alvis Lemmings, RN, BSN 05/13/2014 9:49 AM

## 2014-05-18 ENCOUNTER — Encounter: Payer: Self-pay | Admitting: Cardiology

## 2014-05-26 ENCOUNTER — Encounter: Payer: Medicare Other | Admitting: Internal Medicine

## 2014-05-29 ENCOUNTER — Other Ambulatory Visit: Payer: Self-pay | Admitting: Cardiovascular Disease

## 2014-06-14 ENCOUNTER — Ambulatory Visit (INDEPENDENT_AMBULATORY_CARE_PROVIDER_SITE_OTHER): Payer: Medicare Other | Admitting: *Deleted

## 2014-06-14 DIAGNOSIS — I5022 Chronic systolic (congestive) heart failure: Secondary | ICD-10-CM

## 2014-06-14 DIAGNOSIS — Z95810 Presence of automatic (implantable) cardiac defibrillator: Secondary | ICD-10-CM | POA: Diagnosis not present

## 2014-06-16 ENCOUNTER — Encounter: Payer: Self-pay | Admitting: *Deleted

## 2014-06-16 NOTE — Progress Notes (Signed)
EPIC Encounter for ICM Monitoring  Patient Name: Terry Peck is a 74 y.o. male Date: 06/16/2014 Primary Care Physican: Cathlean Cower, MD Primary Cardiologist: Nahser Electrophysiologist: Allred Dry Weight: 189 lbs  Bi-V pacing: >99%       In the past month, have you:  1. Gained more than 2 pounds in a day or more than 5 pounds in a week? no  2. Had changes in your medications (with verification of current medications)? no  3. Had more shortness of breath than is usual for you? no  4. Limited your activity because of shortness of breath? no  5. Not been able to sleep because of shortness of breath? no  6. Had increased swelling in your feet or ankles? no  7. Had symptoms of dehydration (dizziness, dry mouth, increased thirst, decreased urine output) no  8. Had changes in sodium restriction? no  9. Been compliant with medication? Yes   ICM trend:   Follow-up plan: ICM clinic phone appointment: 07/19/14  Copy of note sent to patient's primary care physician, primary cardiologist, and device following physician.  Alvis Lemmings, RN, BSN 06/16/2014 1:55 PM

## 2014-06-17 DIAGNOSIS — E119 Type 2 diabetes mellitus without complications: Secondary | ICD-10-CM | POA: Diagnosis not present

## 2014-06-17 DIAGNOSIS — Z961 Presence of intraocular lens: Secondary | ICD-10-CM | POA: Diagnosis not present

## 2014-06-17 DIAGNOSIS — H35371 Puckering of macula, right eye: Secondary | ICD-10-CM | POA: Diagnosis not present

## 2014-06-17 LAB — HM DIABETES EYE EXAM

## 2014-06-18 ENCOUNTER — Other Ambulatory Visit: Payer: Self-pay

## 2014-06-18 NOTE — Telephone Encounter (Signed)
Pt. Is not taking glipizide. No medication was sent.

## 2014-06-29 ENCOUNTER — Encounter: Payer: Self-pay | Admitting: Internal Medicine

## 2014-07-04 ENCOUNTER — Other Ambulatory Visit: Payer: Self-pay | Admitting: Cardiovascular Disease

## 2014-07-15 ENCOUNTER — Other Ambulatory Visit: Payer: Self-pay | Admitting: Internal Medicine

## 2014-07-19 ENCOUNTER — Ambulatory Visit (INDEPENDENT_AMBULATORY_CARE_PROVIDER_SITE_OTHER): Payer: Medicare Other | Admitting: *Deleted

## 2014-07-19 DIAGNOSIS — Z95810 Presence of automatic (implantable) cardiac defibrillator: Secondary | ICD-10-CM

## 2014-07-19 DIAGNOSIS — I5022 Chronic systolic (congestive) heart failure: Secondary | ICD-10-CM

## 2014-07-21 ENCOUNTER — Encounter: Payer: Self-pay | Admitting: *Deleted

## 2014-07-21 NOTE — Progress Notes (Signed)
EPIC Encounter for ICM Monitoring  Patient Name: Terry Peck is a 74 y.o. male Date: 07/21/2014 Primary Care Physican: Cathlean Cower, MD Primary Cardiologist: Nahser Electrophysiologist: Allred Dry Weight: 187 lbs   Bi-V pacing: >99%      In the past month, have you:  1. Gained more than 2 pounds in a day or more than 5 pounds in a week? no  2. Had changes in your medications (with verification of current medications)? no  3. Had more shortness of breath than is usual for you? no  4. Limited your activity because of shortness of breath? no  5. Not been able to sleep because of shortness of breath? no  6. Had increased swelling in your feet or ankles? no  7. Had symptoms of dehydration (dizziness, dry mouth, increased thirst, decreased urine output) no  8. Had changes in sodium restriction? no  9. Been compliant with medication? Yes   ICM trend:   Follow-up plan: ICM clinic phone appointment: 08/23/14. The patient's impedence ws below baseline from ~ 2/21-3/1 and then again from ~ 3/10-3/17. The patient denies any change in his symptoms or sodium intake during those times. His weight is actually down about 2 lbs. No changes made today.   Copy of note sent to patient's primary care physician, primary cardiologist, and device following physician.  Alvis Lemmings, RN, BSN 07/21/2014 5:57 PM

## 2014-07-26 ENCOUNTER — Other Ambulatory Visit: Payer: Self-pay | Admitting: Internal Medicine

## 2014-07-29 ENCOUNTER — Other Ambulatory Visit: Payer: Self-pay | Admitting: Internal Medicine

## 2014-08-03 ENCOUNTER — Encounter: Payer: Self-pay | Admitting: Gastroenterology

## 2014-08-23 ENCOUNTER — Ambulatory Visit (INDEPENDENT_AMBULATORY_CARE_PROVIDER_SITE_OTHER): Payer: Medicare Other | Admitting: *Deleted

## 2014-08-23 DIAGNOSIS — Z95810 Presence of automatic (implantable) cardiac defibrillator: Secondary | ICD-10-CM | POA: Diagnosis not present

## 2014-08-23 DIAGNOSIS — I5022 Chronic systolic (congestive) heart failure: Secondary | ICD-10-CM | POA: Diagnosis not present

## 2014-08-24 NOTE — Progress Notes (Signed)
Remote ICD transmission.   

## 2014-08-25 ENCOUNTER — Other Ambulatory Visit: Payer: Self-pay | Admitting: Internal Medicine

## 2014-08-25 ENCOUNTER — Telehealth: Payer: Self-pay | Admitting: *Deleted

## 2014-08-25 ENCOUNTER — Ambulatory Visit (AMBULATORY_SURGERY_CENTER): Payer: Self-pay | Admitting: *Deleted

## 2014-08-25 VITALS — Ht 68.0 in | Wt 195.0 lb

## 2014-08-25 DIAGNOSIS — K2270 Barrett's esophagus without dysplasia: Secondary | ICD-10-CM

## 2014-08-25 NOTE — Progress Notes (Signed)
No egg or soy allergy. No anesthesia problems.  No home O2.  No diet meds.  

## 2014-08-25 NOTE — Telephone Encounter (Signed)
ICM transmission received. I left a message at his home and cell # to call me today in the [place] office is possible, otherwise I will call him back tomorrow.

## 2014-08-26 ENCOUNTER — Encounter: Payer: Self-pay | Admitting: *Deleted

## 2014-08-26 NOTE — Progress Notes (Signed)
EPIC Encounter for ICM Monitoring  Patient Name: Terry Peck is a 74 y.o. male Date: 08/26/2014 Primary Care Physican: Cathlean Cower, MD Primary Cardiologist: Nahser Electrophysiologist: Allred Dry Weight: 187 lbs  Bi-V pacing: >99%       In the past month, have you:  1. Gained more than 2 pounds in a day or more than 5 pounds in a week? no  2. Had changes in your medications (with verification of current medications)? no  3. Had more shortness of breath than is usual for you? no  4. Limited your activity because of shortness of breath? no  5. Not been able to sleep because of shortness of breath? no  6. Had increased swelling in your feet or ankles? no  7. Had symptoms of dehydration (dizziness, dry mouth, increased thirst, decreased urine output) no  8. Had changes in sodium restriction? no  9. Been compliant with medication? Yes   ICM trend:   Follow-up plan: ICM clinic phone appointment: 09/28/14. No changes made today.  Copy of note sent to patient's primary care physician, primary cardiologist, and device following physician.  Alvis Lemmings, RN, BSN 08/26/2014 11:42 AM

## 2014-08-26 NOTE — Telephone Encounter (Signed)
I spoke with the patient. 

## 2014-08-26 NOTE — Addendum Note (Signed)
Addended by: Alvis Lemmings C on: 08/26/2014 11:46 AM   Modules accepted: Level of Service

## 2014-08-27 LAB — CUP PACEART REMOTE DEVICE CHECK
Battery Remaining Longevity: 61 mo
Battery Remaining Percentage: 68 %
Battery Voltage: 2.96 V
Brady Statistic AP VP Percent: 9.6 %
Brady Statistic AS VP Percent: 90 %
Date Time Interrogation Session: 20160502060016
HIGH POWER IMPEDANCE MEASURED VALUE: 72 Ohm
HighPow Impedance: 72 Ohm
Lead Channel Impedance Value: 430 Ohm
Lead Channel Impedance Value: 430 Ohm
Lead Channel Impedance Value: 940 Ohm
Lead Channel Pacing Threshold Amplitude: 1 V
Lead Channel Pacing Threshold Amplitude: 1.125 V
Lead Channel Pacing Threshold Pulse Width: 0.5 ms
Lead Channel Pacing Threshold Pulse Width: 0.5 ms
Lead Channel Pacing Threshold Pulse Width: 0.5 ms
Lead Channel Sensing Intrinsic Amplitude: 11.4 mV
Lead Channel Setting Pacing Amplitude: 2 V
Lead Channel Setting Pacing Amplitude: 2.125
Lead Channel Setting Pacing Pulse Width: 0.5 ms
MDC IDC MSMT LEADCHNL RA SENSING INTR AMPL: 3.9 mV
MDC IDC MSMT LEADCHNL RV PACING THRESHOLD AMPLITUDE: 0.75 V
MDC IDC SET LEADCHNL RV PACING AMPLITUDE: 2 V
MDC IDC SET LEADCHNL RV PACING PULSEWIDTH: 0.5 ms
MDC IDC SET LEADCHNL RV SENSING SENSITIVITY: 0.5 mV
MDC IDC STAT BRADY AP VS PERCENT: 1 %
MDC IDC STAT BRADY AS VS PERCENT: 1 %
MDC IDC STAT BRADY RA PERCENT PACED: 9.6 %
Pulse Gen Model: 3265
Pulse Gen Serial Number: 7054987
Zone Setting Detection Interval: 270 ms
Zone Setting Detection Interval: 350 ms

## 2014-09-02 ENCOUNTER — Encounter: Payer: Self-pay | Admitting: Cardiology

## 2014-09-06 ENCOUNTER — Encounter: Payer: Self-pay | Admitting: Gastroenterology

## 2014-09-06 ENCOUNTER — Other Ambulatory Visit: Payer: Self-pay | Admitting: Gastroenterology

## 2014-09-06 ENCOUNTER — Ambulatory Visit (AMBULATORY_SURGERY_CENTER): Payer: Medicare Other | Admitting: Gastroenterology

## 2014-09-06 VITALS — BP 146/81 | HR 60 | Temp 95.9°F | Resp 15 | Ht 68.0 in | Wt 198.0 lb

## 2014-09-06 DIAGNOSIS — J8410 Pulmonary fibrosis, unspecified: Secondary | ICD-10-CM | POA: Diagnosis not present

## 2014-09-06 DIAGNOSIS — K449 Diaphragmatic hernia without obstruction or gangrene: Secondary | ICD-10-CM

## 2014-09-06 DIAGNOSIS — K2270 Barrett's esophagus without dysplasia: Secondary | ICD-10-CM | POA: Diagnosis present

## 2014-09-06 LAB — GLUCOSE, CAPILLARY
GLUCOSE-CAPILLARY: 88 mg/dL (ref 65–99)
Glucose-Capillary: 90 mg/dL (ref 65–99)

## 2014-09-06 MED ORDER — SODIUM CHLORIDE 0.9 % IV SOLN: 500.0000 mL | INTRAVENOUS | Status: DC

## 2014-09-06 NOTE — Patient Instructions (Signed)
YOU HAD AN ENDOSCOPIC PROCEDURE TODAY AT [facility]:   Refer to the procedure report that was given to you for any specific questions about what was found during the examination.  If the procedure report does not answer your questions, please call your gastroenterologist to clarify.  If you requested that your care partner not be given the details of your procedure findings, then the procedure report has been included in a sealed envelope for you to review at your convenience later.  YOU SHOULD EXPECT: Some feelings of bloating in the abdomen. Passage of more gas than usual.  Walking can help get rid of the air that was put into your GI tract during the procedure and reduce the bloating. If you had a lower endoscopy (such as a colonoscopy or flexible sigmoidoscopy) you may notice spotting of blood in your stool or on the toilet paper. If you underwent a bowel prep for your procedure, you may not have a normal bowel movement for a few days.  Please Note:  You might notice some irritation and congestion in your nose or some drainage.  This is from the oxygen used during your procedure.  There is no need for concern and it should clear up in a day or so.  SYMPTOMS TO REPORT IMMEDIATELY:    Following upper endoscopy (EGD)  Vomiting of blood or coffee ground material  New chest pain or pain under the shoulder blades  Painful or persistently difficult swallowing  New shortness of breath  Fever of 100F or higher  Black, tarry-looking stools  For urgent or emergent issues, a gastroenterologist can be reached at any hour by calling [phone].   DIET: Your first meal following the procedure should be a small meal and then it is ok to progress to your normal diet. Heavy or fried foods are harder to digest and may make you feel nauseous or bloated.  Likewise, meals heavy in dairy and vegetables can increase bloating.  Drink plenty of fluids but you should avoid alcoholic beverages  for 24 hours.  ACTIVITY:  You should plan to take it easy for the rest of today and you should NOT DRIVE or use heavy machinery until tomorrow (because of the sedation medicines used during the test).    FOLLOW UP: Our staff will call the number listed on your records the next business day following your procedure to check on you and address any questions or concerns that you may have regarding the information given to you following your procedure. If we do not reach you, we will leave a message.  However, if you are feeling well and you are not experiencing any problems, there is no need to return our call.  We will assume that you have returned to your regular daily activities without incident.  If any biopsies were taken you will be contacted by phone or by letter within the next 1-3 weeks.  Please call us at [phone] if you have not heard about the biopsies in 3 weeks.    SIGNATURES/CONFIDENTIALITY: You and/or your care partner have signed paperwork which will be entered into your electronic medical record.  These signatures attest to the fact that that the information above on your After Visit Summary has been reviewed and is understood.  Full responsibility of the confidentiality of this discharge information lies with you and/or your care-partner.  Hiatal hernia, barrett's-handouts given

## 2014-09-06 NOTE — Progress Notes (Signed)
Report to PACU, RN, vss, BBS= Clear.  

## 2014-09-06 NOTE — Op Note (Signed)
[place]  [address] [place], [id number]   ENDOSCOPY PROCEDURE REPORT  PATIENT: Terry Peck, Terry Peck  MR#: [id number] BIRTHDATE: [birth year] , 74  yrs. old GENDER: male ENDOSCOPIST: Milus Banister, MD PROCEDURE DATE:  09/06/2014 PROCEDURE:  EGD w/ biopsy ASA CLASS:     Class III INDICATIONS:  Barrett's established.  Last EGD 3 years ago, no dysplasia.  No GERD symptoms as long as he takes BID PPI, stable weight, no dysphagia.Marland Kitchen MEDICATIONS: Monitored anesthesia care and Propofol 200 mg IV TOPICAL ANESTHETIC: none  DESCRIPTION OF PROCEDURE: After the risks benefits and alternatives of the procedure were thoroughly explained, informed consent was obtained.  The LB [id number] endoscope was introduced through the mouth and advanced to the second portion of the duodenum , Without limitations.  The instrument was slowly withdrawn as the mucosa was fully examined.  There was a circumferential 5cm segment of non-nodular Barrett's mucosa from 28cm to the GE junction (33cm).  Prague Classification C5M5.  This was sampled with four quadrant biopsies, 2cm segment (3 jars) and sent to pathology.  There was a 3-4cm hiatal hernia.  The examination was otherwise normal.  Retroflexed views revealed no abnormalities.     The scope was then withdrawn from the patient and the procedure completed.  COMPLICATIONS: There were no immediate complications.  ENDOSCOPIC IMPRESSION: There was a circumferential 5cm segment of non-nodular Barrett's mucosa from 28cm to the GE junction (33cm).  Prague Classification C5M5.  This was sampled with four quadrant biopsies, 2cm segment (3 jars) and sent to pathology.  There was a 3-4cm hiatal hernia.  The examination was otherwise normal  RECOMMENDATIONS: Await final pathology results.  Continue your protonix twice daily.    eSigned:  Milus Banister, MD 09/06/2014 10:23 AM    CC: Cathlean Cower, MD

## 2014-09-06 NOTE — Progress Notes (Signed)
Called to room to assist during endoscopic procedure.  Patient ID and intended procedure confirmed with present staff. Received instructions for my participation in the procedure from the performing physician.  

## 2014-09-07 ENCOUNTER — Encounter: Payer: Self-pay | Admitting: Internal Medicine

## 2014-09-07 ENCOUNTER — Telehealth: Payer: Self-pay

## 2014-09-07 NOTE — Telephone Encounter (Signed)
  Follow up Call-  Call back number 09/06/2014  Post procedure Call Back phone  # [phone]  Permission to leave phone message Yes     Patient questions:  Do you have a fever, pain , or abdominal swelling? No. Pain Score  0 *  Have you tolerated food without any problems? Yes.    Have you been able to return to your normal activities? Yes.    Do you have any questions about your discharge instructions: Diet   No. Medications  No. Follow up visit  No.  Do you have questions or concerns about your Care? No.  Actions: * If pain score is 4 or above: No action needed, pain <4.

## 2014-09-09 ENCOUNTER — Encounter: Payer: Self-pay | Admitting: Gastroenterology

## 2014-09-16 ENCOUNTER — Other Ambulatory Visit: Payer: Self-pay | Admitting: Cardiovascular Disease

## 2014-09-28 ENCOUNTER — Ambulatory Visit (INDEPENDENT_AMBULATORY_CARE_PROVIDER_SITE_OTHER): Payer: Medicare Other | Admitting: *Deleted

## 2014-09-28 ENCOUNTER — Telehealth: Payer: Self-pay | Admitting: *Deleted

## 2014-09-28 DIAGNOSIS — Z95810 Presence of automatic (implantable) cardiac defibrillator: Secondary | ICD-10-CM | POA: Diagnosis not present

## 2014-09-28 DIAGNOSIS — I5022 Chronic systolic (congestive) heart failure: Secondary | ICD-10-CM | POA: Diagnosis not present

## 2014-09-28 NOTE — Telephone Encounter (Signed)
ICM transmission received. I attempted to call the home #- no answer. I called the patient's cell #- the person who answered stated we had the wrong #. I will attempt to call the patient again when I return to the office on Thursday 6/9.

## 2014-09-30 ENCOUNTER — Encounter: Payer: Self-pay | Admitting: *Deleted

## 2014-09-30 NOTE — Progress Notes (Signed)
EPIC Encounter for ICM Monitoring  Patient Name: Terry Peck is a 74 y.o. male Date: 09/30/2014 Primary Care Physican: Cathlean Cower, MD Primary Cardiologist: Nahser Electrophysiologist: Allred Dry Weight: 187 lbs  Bi-V pacing: >99%       In the past month, have you:  1. Gained more than 2 pounds in a day or more than 5 pounds in a week? no  2. Had changes in your medications (with verification of current medications)? no  3. Had more shortness of breath than is usual for you? no  4. Limited your activity because of shortness of breath? no  5. Not been able to sleep because of shortness of breath? no  6. Had increased swelling in your feet or ankles? no  7. Had symptoms of dehydration (dizziness, dry mouth, increased thirst, decreased urine output) no  8. Had changes in sodium restriction? no  9. Been compliant with medication? Yes   ICM trend:   Follow-up plan: ICM clinic phone appointment: 11/01/14. The patient's corvue readings have been elevated from ~ 5/29 to present. He is not having any noticeable symptoms or change in his weight. He typically takes lasix 40 mg once daily. I have advised him to take an additional 20 mg daily x 3 days, then resume his normal dosing. He verbalizes understanding.   Copy of note sent to patient's primary care physician, primary cardiologist, and device following physician.  Alvis Lemmings, RN, BSN 09/30/2014 2:43 PM

## 2014-09-30 NOTE — Telephone Encounter (Signed)
I spoke with the patient. 

## 2014-10-18 ENCOUNTER — Other Ambulatory Visit: Payer: Self-pay

## 2014-10-21 ENCOUNTER — Other Ambulatory Visit: Payer: Medicare Other

## 2014-10-23 ENCOUNTER — Encounter: Payer: Self-pay | Admitting: Internal Medicine

## 2014-10-25 ENCOUNTER — Encounter: Payer: Self-pay | Admitting: Internal Medicine

## 2014-11-01 ENCOUNTER — Ambulatory Visit (INDEPENDENT_AMBULATORY_CARE_PROVIDER_SITE_OTHER): Payer: Medicare Other | Admitting: *Deleted

## 2014-11-01 ENCOUNTER — Ambulatory Visit (INDEPENDENT_AMBULATORY_CARE_PROVIDER_SITE_OTHER): Payer: Medicare Other | Admitting: Cardiovascular Disease

## 2014-11-01 ENCOUNTER — Encounter: Payer: Self-pay | Admitting: Cardiovascular Disease

## 2014-11-01 VITALS — BP 126/60 | HR 92 | Ht 68.0 in | Wt 194.6 lb

## 2014-11-01 DIAGNOSIS — I5022 Chronic systolic (congestive) heart failure: Secondary | ICD-10-CM

## 2014-11-01 DIAGNOSIS — I447 Left bundle-branch block, unspecified: Secondary | ICD-10-CM

## 2014-11-01 DIAGNOSIS — R0602 Shortness of breath: Secondary | ICD-10-CM

## 2014-11-01 DIAGNOSIS — Z95810 Presence of automatic (implantable) cardiac defibrillator: Secondary | ICD-10-CM

## 2014-11-01 LAB — BASIC METABOLIC PANEL
BUN: 25 mg/dL — AB (ref 6–23)
CHLORIDE: 106 meq/L (ref 96–112)
CO2: 24 meq/L (ref 19–32)
CREATININE: 0.99 mg/dL (ref 0.40–1.50)
Calcium: 9.2 mg/dL (ref 8.4–10.5)
GFR: 78.47 mL/min (ref >60.00)
Glucose, Bld: 208 mg/dL — ABNORMAL HIGH (ref 70–99)
Potassium: 4.6 mEq/L (ref 3.5–5.1)
Sodium: 139 mEq/L (ref 135–145)

## 2014-11-01 LAB — BRAIN NATRIURETIC PEPTIDE: PRO B NATRI PEPTIDE: 48 pg/mL (ref 0.0–100.0)

## 2014-11-01 NOTE — Patient Instructions (Addendum)
Medication Instructions:  Your physician recommends that you continue on your current medications as directed. Please refer to the Current Medication list given to you today.   Labwork: TODAY - BNP, BMET  Testing/Procedures: Your physician has requested that you have an echocardiogram. Echocardiography is a painless test that uses sound waves to create images of your heart. It provides your doctor with information about the size and shape of your heart and how well your heart's chambers and valves are working. This procedure takes approximately one hour. There are no restrictions for this procedure.   Follow-Up: Your physician wants you to follow-up in: 3 months with Dr. Acie Fredrickson.  You will receive a reminder letter in the mail two months in advance. If you don't receive a letter, please call our office to schedule the follow-up appointment.

## 2014-11-01 NOTE — Progress Notes (Signed)
[name] Date of Birth  04-12-1941 [place] [address]    [place], [place]  [address]    [place], [place]  [address] [phone]  Fax  [phone]  [phone]  Fax [phone]  Problem list: 1. Left bundle branch block 2. Diabetes mellitus 3. Hyperlipidemia 4. Hypertension 5. Congestive heart failure-EF equals 30-35% 6. Bi-V ICD placement  History of Present Illness:  Terry Peck is a 74 yo who presents for evaluation of dyspnea.  He has been found to have a LBBB and has CHF with an EF of 30-35%.  He gets dyspneac with exercise ( mowing the lawn).  He denies any PND or orthopnea. He describes generalized fatigue with exertion.  The shortness of breath has been present for about a year. He admits to eating a fair amount of salty foods. He eats hot dogs, ham, bacon, sausage. He does not use any exercise but instead uses potassium chloride ( No salt).  He was seen by his medical doctor and was found to have a left bundle branch block. and was referred here for further evaluation. It was at that point that he had an echocardiogram which revealed global left ventricular systolic dysfunction with an EF of 30-35%.  He had a stress Myoview study which reveals an ejection fraction 29%. There is global left ventricular dilatation. There is no evidence of ischemia. His symptoms have improved dramatically since we've stopped his verapamil and start him on carvedilol.  Continue to gradually titrate up his medications. We change his HCTZ to Lasix during his last visit. We also added lisinopril. He's feeling better.  He is able to do more of his normal activities without severe dyspnea.  December 10, 2011 He continues to gradually and steadily improved. He is now able to mow his lawn and edge  without any difficulties. He's doing quite well at cardiac rehabilitation to  October 17, 2012:  Terry Peck is doing great.  He has definitely  seen a benefit from the BI-V pacer / ICD.  He's able to do all of his yard work without significant difficulty or shortness of breath.  No chest pain.  We have not done a cardiac cath - he was not found to have significant ischemia on myoview and he has made tremendous progress with medical therapy for his CHF.   Echo in April , 2014 Study Conclusions  - Left ventricle: The cavity size was normal. Wall thickness was increased in a pattern of severe LVH. Systolic function was normal. The estimated ejection fraction was 55%. Wall motion was normal; there were no regional wall motion abnormalities. - Left atrium: The atrium was mildly dilated. - Atrial septum: No defect or patent foramen ovale was identified.  His LV EF has normalized with medical therapy and the ICD.   Dec. 2, 2014:  Terry Peck is doing well.  Exercising well.  No CP, no dyspnea.    November 02, 2013:  Terry Peck is doing well. He did not take his furosemide yesterday or today and so his blood pressure is a little bit higher. He takes his blood pressure on a regular basis at home and his readings are in the normal range.  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 13, 2016 Terry Peck is a 74 yo with hx of chronic systolic CHF.  He is s/p Bi -  V  ICD placement.   His EF has normalized since placement of the Bi-V ICD.  He is feeling much better. Exercising and doing all that he can do.   He now has a Optivol ( or equivalent)  Feature enabled .  Volume status is good.   November 01, 2014:  Has some DOE recently .  Very mild  Not eating any extra salt.     Current Outpatient Prescriptions on File Prior to Visit  Medication Sig Dispense Refill  . aspirin EC 81 MG tablet Take 81 mg by mouth daily.    Marland Kitchen atorvastatin (LIPITOR) 40 MG tablet TAKE ONE TABLET BY MOUTH ONCE DAILY 90 tablet 1  . Calcium-Magnesium-Zinc 1000-400-15 MG TABS Take 1 tablet by mouth daily.     . carvedilol (COREG) 25  MG tablet Take 1 tablet (25 mg total) by mouth 2 (two) times daily with a meal. 180 tablet 3  . Cholecalciferol (VITAMIN D) 1000 UNITS capsule Take 1,000 Units by mouth daily.     . ferrous fumarate (HEMOCYTE - 106 MG FE) 325 (106 FE) MG TABS Take 1 tablet by mouth daily.    . furosemide (LASIX) 40 MG tablet TAKE ONE TABLET BY MOUTH ONCE DAILY 30 tablet 5  . Glucose Blood (BAYER [name] 2 TEST) DISK Test four times daily as directed 200 each 11  . ibuprofen (ADVIL,MOTRIN) 200 MG tablet Take 200-300 mg by mouth 2 (two) times daily as needed. For pain    . Insulin Glargine (LANTUS SOLOSTAR) 100 UNIT/ML Solostar Pen Inject 44 Units into the skin daily. 3 pen 3  . Insulin Pen Needle (BD PEN NEEDLE NANO U/F) 32G X 4 MM MISC USE ONE PEN NEEDLE SUBCUTANEOUSLY EVERY DAY DX 250.02 100 each 3  . Lutein 20 MG CAPS Take 20 mg by mouth daily.    . metFORMIN (GLUCOPHAGE) 850 MG tablet TAKE ONE TABLET BY MOUTH THREE TIMES DAILY 270 tablet 2  . Multiple Vitamin (MULTIVITAMIN WITH MINERALS) TABS Take 1 tablet by mouth daily.    . OMEGA 3 1000 MG CAPS Take 1,000 mg by mouth 2 (two) times daily.     Marland Kitchen omeprazole (PRILOSEC) 20 MG capsule TAKE ONE CAPSULE BY MOUTH TWICE DAILY 180 capsule 2  . OVER THE COUNTER MEDICATION Take 5,000 mcg by mouth daily. Vitamin B 12 5000 mcg tablet    . pioglitazone (ACTOS) 45 MG tablet TAKE ONE TABLET BY MOUTH AT BEDTIME 90 tablet 2  . [DISCONTINUED] simvastatin (ZOCOR) 80 MG tablet Take 1 tablet (80 mg total) by mouth at bedtime. 90 tablet 3   No current facility-administered medications on file prior to visit.    Allergies  Allergen Reactions  . Penicillins Anaphylaxis and Other (See Comments)    "swelling of eyes; throat; could breath good" (01/29/2012)  . Iodine Other (See Comments)    "if I get iodine dye, I'll get a fever" (01/29/2012)    Past Medical History  Diagnosis Date  . Allergy   . GERD (gastroesophageal reflux disease)   . Hypertension   . Colon polyps 11/23/2010   . Barrett's esophagus 11/23/2010  . Abnormal ANCA test 07/03/2011  . Chronic systolic dysfunction of left ventricle     EF 30%  . Pulmonary fibrosis   . Pulmonary nodules   . Iron deficiency anemia 05/30/2011  . B12 deficiency anemia   . Bundle branch block, left   . ICD (implantable cardiac defibrillator) in place   . Shortness of breath     "  related to heart being out of rhythm" (01/29/2012)  . Type II diabetes mellitus   . Arthritis     "mild; right shoulder" (01/29/2012)  . Liver abscess 1980's  . Nonischemic cardiomyopathy     s/p St. Jude BiV ICD 01/29/12  . Hyperlipidemia 10/14/2012  . Cataract     L eye surgery  . CHF (congestive heart failure)     pacemaker    Past Surgical History  Procedure Laterality Date  . Tonsillectomy  1952  . Cardiac defibrillator placement  01/29/2012    SJM Quadra Assura BiV ICD implanted by Dr Rayann Heman  . Abscess drained  ?1980's    liver  . Bi-ventricular implantable cardioverter defibrillator N/A 01/29/2012    Procedure: BI-VENTRICULAR IMPLANTABLE CARDIOVERTER DEFIBRILLATOR  (CRT-D);  Surgeon: Thompson Grayer, MD;  Location[facility] C[facility] LAB;  Service: Cardiovascular;  Laterality: N/A;  . Cataract extraction Left   . Colonoscopy    . Upper gi endoscopy      History  Smoking status  . Former Smoker -- 0.50 packs/day for 10 years  . Types: Cigarettes  . Quit date: 04/23/1988  Smokeless tobacco  . Never Used    History  Alcohol Use No    Family History  Problem Relation Age of Onset  . Heart disease Mother   . Colon cancer Neg Hx   . Esophageal cancer Neg Hx   . Stomach cancer Neg Hx     Reviw of Systems:  Reviewed in the HPI.  All other systems are negative.  Physical Exam: Blood pressure 126/60, pulse 92, height 5\' 8"  (1.727 m), weight 88.27 kg (194 lb 9.6 oz). General: Well developed, well nourished, in no acute distress.  Head: Normocephalic, atraumatic, sclera non-icteric, mucus membranes are moist,   Neck: Supple. Carotids  are 2 + without bruits. No JVD  Lungs: clear  Heart: regular rate.  normal  S1 S2.  2/6 systolic murmur   Abdomen: Soft, non-tender, non-distended with normal bowel sounds. No hepatomegaly. No rebound/guarding. No masses.  Msk:  Strength and tone are normal  Extremities: No clubbing or cyanosis. No edema.  Distal pedal pulses are 2+ and equal bilaterally.  Neuro: Alert and oriented X 3. Moves all extremities spontaneously.  Psych:  Responds to questions appropriately with a normal affect.  ECG: 05/05/2004 HEENT: Normal sinus rhythm at 65. He has biventricular pacing.  Assessment / Plan:   1. Left bundle branch block-  S/p Bi-V pacer ,  Doing well.  2. Diabetes mellitus 3. Hyperlipidemia 4. Hypertension 5. Congestive heart failure-    His EF had improved from 30% to 55% by echo in 2014.     Will repeat echo to make sure his LV function has     His Corvue measurements show very minimal volume overload over the past week.  Will have hime work on weight loss . Will check BMP and BNP today .   6. Bi-V ICD placement    Tavonna Worthington, Wonda Cheng, MD  11/01/2014 4:03 PM    [facility] [place],  [place], [place]  [address] Pager [phone] Phone: [phone]; Fax: [phone]   [facility]  [address] [place], [place]  [address] [phone]    Fax [phone]

## 2014-11-02 ENCOUNTER — Encounter: Payer: Self-pay | Admitting: *Deleted

## 2014-11-02 NOTE — Progress Notes (Signed)
EPIC Encounter for ICM Monitoring  Patient Name: Terry Peck is a 74 y.o. male Date: 11/01/2014 Primary Care Physican: Cathlean Cower, MD Primary Cardiologist: Nahser Electrophysiologist: Allred Dry Weight: 187 lbs   Bi-V pacing: >99%      In the past month, have you:  1. Gained more than 2 pounds in a day or more than 5 pounds in a week? no  2. Had changes in your medications (with verification of current medications)? no  3. Had more shortness of breath than is usual for you? Yes. I was able to see the patient during his office visit with  Dr. Acie Fredrickson today. Corvue readings were reviewed with the patient and Dr. Acie Fredrickson. The patient was reporting increased SOB over the last year with activity.   4. Limited your activity because of shortness of breath? no  5. Not been able to sleep because of shortness of breath? no  6. Had increased swelling in your feet or ankles? no  7. Had symptoms of dehydration (dizziness, dry mouth, increased thirst, decreased urine output) no  8. Had changes in sodium restriction? No. Per Dr. Elmarie Shiley report the patient does typically eat hot dogs, bacon, sausage, and ham.   9. Been compliant with medication? Yes   ICM trend:   Follow-up plan: ICM clinic phone appointment: 12/02/14. The patient's corvue readings were most recently elevated from ~ 6/24-7/1. Per Dr. Elmarie Shiley evaluation today, no changes to be made with medications. Weight loss has been encouraged due to SOB, but a repeat echo has also been ordered to follow up on his LV function. A BMP/ BNp will be drawn today. I will follow up with the patient in 1 month.  Copy of note sent to patient's primary care physician, primary cardiologist, and device following physician.  Alvis Lemmings, RN, BSN 11/02/2014 8:50 AM

## 2014-11-08 ENCOUNTER — Encounter: Payer: Self-pay | Admitting: Internal Medicine

## 2014-11-11 ENCOUNTER — Other Ambulatory Visit: Payer: Self-pay | Admitting: Internal Medicine

## 2014-11-12 ENCOUNTER — Ambulatory Visit (HOSPITAL_COMMUNITY): Payer: Medicare Other | Attending: Cardiovascular Disease

## 2014-11-12 ENCOUNTER — Other Ambulatory Visit: Payer: Self-pay

## 2014-11-12 DIAGNOSIS — E785 Hyperlipidemia, unspecified: Secondary | ICD-10-CM | POA: Diagnosis not present

## 2014-11-12 DIAGNOSIS — R0602 Shortness of breath: Secondary | ICD-10-CM | POA: Diagnosis not present

## 2014-11-12 DIAGNOSIS — E119 Type 2 diabetes mellitus without complications: Secondary | ICD-10-CM | POA: Insufficient documentation

## 2014-11-12 DIAGNOSIS — I5022 Chronic systolic (congestive) heart failure: Secondary | ICD-10-CM

## 2014-11-12 DIAGNOSIS — I10 Essential (primary) hypertension: Secondary | ICD-10-CM | POA: Insufficient documentation

## 2014-11-12 DIAGNOSIS — R0600 Dyspnea, unspecified: Secondary | ICD-10-CM | POA: Diagnosis present

## 2014-11-12 DIAGNOSIS — I350 Nonrheumatic aortic (valve) stenosis: Secondary | ICD-10-CM | POA: Insufficient documentation

## 2014-11-12 DIAGNOSIS — I358 Other nonrheumatic aortic valve disorders: Secondary | ICD-10-CM | POA: Insufficient documentation

## 2014-11-17 ENCOUNTER — Other Ambulatory Visit (INDEPENDENT_AMBULATORY_CARE_PROVIDER_SITE_OTHER): Payer: Medicare Other

## 2014-11-17 ENCOUNTER — Telehealth: Payer: Self-pay | Admitting: Internal Medicine

## 2014-11-17 ENCOUNTER — Encounter: Payer: Self-pay | Admitting: Internal Medicine

## 2014-11-17 ENCOUNTER — Emergency Department (HOSPITAL_COMMUNITY): Payer: Medicare Other

## 2014-11-17 ENCOUNTER — Observation Stay (HOSPITAL_COMMUNITY)
Admission: EM | Admit: 2014-11-17 | Discharge: 2014-11-18 | DRG: 812 | Disposition: A | Discharge status: Home / Self Care | Payer: Medicare Other | Attending: Internal Medicine | Admitting: Internal Medicine

## 2014-11-17 ENCOUNTER — Ambulatory Visit (INDEPENDENT_AMBULATORY_CARE_PROVIDER_SITE_OTHER): Payer: Medicare Other | Admitting: Internal Medicine

## 2014-11-17 ENCOUNTER — Encounter (HOSPITAL_COMMUNITY): Payer: Self-pay | Admitting: *Deleted

## 2014-11-17 VITALS — BP 152/78 | HR 77 | Temp 97.8°F | Wt 188.2 lb

## 2014-11-17 DIAGNOSIS — Z87891 Personal history of nicotine dependence: Secondary | ICD-10-CM | POA: Diagnosis not present

## 2014-11-17 DIAGNOSIS — K219 Gastro-esophageal reflux disease without esophagitis: Secondary | ICD-10-CM | POA: Diagnosis not present

## 2014-11-17 DIAGNOSIS — Z8249 Family history of ischemic heart disease and other diseases of the circulatory system: Secondary | ICD-10-CM

## 2014-11-17 DIAGNOSIS — I10 Essential (primary) hypertension: Secondary | ICD-10-CM | POA: Diagnosis present

## 2014-11-17 DIAGNOSIS — R911 Solitary pulmonary nodule: Secondary | ICD-10-CM | POA: Diagnosis not present

## 2014-11-17 DIAGNOSIS — D509 Iron deficiency anemia, unspecified: Secondary | ICD-10-CM | POA: Diagnosis not present

## 2014-11-17 DIAGNOSIS — Z95810 Presence of automatic (implantable) cardiac defibrillator: Secondary | ICD-10-CM

## 2014-11-17 DIAGNOSIS — R0602 Shortness of breath: Secondary | ICD-10-CM

## 2014-11-17 DIAGNOSIS — D649 Anemia, unspecified: Secondary | ICD-10-CM | POA: Diagnosis present

## 2014-11-17 DIAGNOSIS — E785 Hyperlipidemia, unspecified: Secondary | ICD-10-CM | POA: Diagnosis not present

## 2014-11-17 DIAGNOSIS — Z0000 Encounter for general adult medical examination without abnormal findings: Secondary | ICD-10-CM

## 2014-11-17 DIAGNOSIS — K2270 Barrett's esophagus without dysplasia: Secondary | ICD-10-CM | POA: Diagnosis present

## 2014-11-17 DIAGNOSIS — Z79899 Other long term (current) drug therapy: Secondary | ICD-10-CM

## 2014-11-17 DIAGNOSIS — Z86010 Personal history of colonic polyps: Secondary | ICD-10-CM | POA: Diagnosis not present

## 2014-11-17 DIAGNOSIS — I447 Left bundle-branch block, unspecified: Secondary | ICD-10-CM | POA: Diagnosis not present

## 2014-11-17 DIAGNOSIS — I5022 Chronic systolic (congestive) heart failure: Secondary | ICD-10-CM | POA: Diagnosis present

## 2014-11-17 DIAGNOSIS — R0902 Hypoxemia: Secondary | ICD-10-CM | POA: Diagnosis not present

## 2014-11-17 DIAGNOSIS — E119 Type 2 diabetes mellitus without complications: Secondary | ICD-10-CM

## 2014-11-17 DIAGNOSIS — Z7982 Long term (current) use of aspirin: Secondary | ICD-10-CM

## 2014-11-17 DIAGNOSIS — Z791 Long term (current) use of non-steroidal anti-inflammatories (NSAID): Secondary | ICD-10-CM | POA: Diagnosis not present

## 2014-11-17 DIAGNOSIS — Z8711 Personal history of peptic ulcer disease: Secondary | ICD-10-CM | POA: Diagnosis not present

## 2014-11-17 DIAGNOSIS — Z880 Allergy status to penicillin: Secondary | ICD-10-CM | POA: Diagnosis not present

## 2014-11-17 DIAGNOSIS — Z794 Long term (current) use of insulin: Secondary | ICD-10-CM | POA: Diagnosis not present

## 2014-11-17 DIAGNOSIS — J8410 Pulmonary fibrosis, unspecified: Secondary | ICD-10-CM

## 2014-11-17 DIAGNOSIS — J84112 Idiopathic pulmonary fibrosis: Secondary | ICD-10-CM | POA: Diagnosis present

## 2014-11-17 DIAGNOSIS — Z91048 Other nonmedicinal substance allergy status: Secondary | ICD-10-CM | POA: Diagnosis not present

## 2014-11-17 DIAGNOSIS — R0609 Other forms of dyspnea: Secondary | ICD-10-CM

## 2014-11-17 DIAGNOSIS — E538 Deficiency of other specified B group vitamins: Secondary | ICD-10-CM | POA: Diagnosis present

## 2014-11-17 DIAGNOSIS — M1990 Unspecified osteoarthritis, unspecified site: Secondary | ICD-10-CM | POA: Diagnosis not present

## 2014-11-17 LAB — COMPREHENSIVE METABOLIC PANEL
ALT: 17 U/L (ref 17–63)
ANION GAP: 11 (ref 5–15)
AST: 23 U/L (ref 15–41)
Albumin: 3.8 g/dL (ref 3.5–5.0)
Alkaline Phosphatase: 34 U/L — ABNORMAL LOW (ref 38–126)
BUN: 23 mg/dL — AB (ref 6–20)
CO2: 22 mmol/L (ref 22–32)
CREATININE: 0.94 mg/dL (ref 0.61–1.24)
Calcium: 9.2 mg/dL (ref 8.9–10.3)
Chloride: 104 mmol/L (ref 101–111)
GFR calc non Af Amer: >60 mL/min (ref >60)
Glucose, Bld: 194 mg/dL — ABNORMAL HIGH (ref 65–99)
POTASSIUM: 4.2 mmol/L (ref 3.5–5.1)
Sodium: 137 mmol/L (ref 135–145)
Total Bilirubin: 0.5 mg/dL (ref 0.3–1.2)
Total Protein: 7.4 g/dL (ref 6.5–8.1)

## 2014-11-17 LAB — CBC WITH DIFFERENTIAL/PLATELET
BASOS PCT: 0.1 % (ref 0.0–3.0)
Basophils Absolute: 0 10*3/uL (ref 0.0–0.1)
Basophils Absolute: 0.1 10*3/uL (ref 0.0–0.1)
Basophils Relative: 1 % (ref 0–1)
EOS ABS: 0.2 10*3/uL (ref 0.0–0.7)
EOS ABS: 0.3 10*3/uL (ref 0.0–0.7)
EOS PCT: 3.1 % (ref 0.0–5.0)
EOS PCT: 4 % (ref 0–5)
HEMATOCRIT: 20.3 % — AB (ref 39.0–52.0)
HEMATOCRIT: 20 % — AB (ref 39.0–52.0)
Hemoglobin: 6.4 g/dL — CL (ref 13.0–17.0)
Hemoglobin: 6 g/dL — CL (ref 13.0–17.0)
LYMPHS ABS: 1.5 10*3/uL (ref 0.7–4.0)
LYMPHS PCT: 18.8 % (ref 12.0–46.0)
Lymphocytes Relative: 18 % (ref 12–46)
Lymphs Abs: 1.3 10*3/uL (ref 0.7–4.0)
MCH: 23.7 pg — ABNORMAL LOW (ref 26.0–34.0)
MCHC: 31.5 g/dL (ref 30.0–36.0)
MCHC: 30 g/dL (ref 30.0–36.0)
MCV: 76.6 fl — ABNORMAL LOW (ref 78.0–100.0)
MCV: 79.1 fL (ref 78.0–100.0)
Monocytes Absolute: 0.7 10*3/uL (ref 0.1–1.0)
Monocytes Absolute: 0.8 10*3/uL (ref 0.1–1.0)
Monocytes Relative: 9.2 % (ref 3.0–12.0)
Monocytes Relative: 12 % (ref 3–12)
NEUTROS ABS: 5.4 10*3/uL (ref 1.4–7.7)
NEUTROS PCT: 68.8 % (ref 43.0–77.0)
NEUTROS PCT: 65 % (ref 43–77)
Neutro Abs: 4.5 10*3/uL (ref 1.7–7.7)
Platelets: 291 10*3/uL (ref 150.0–400.0)
Platelets: 270 10*3/uL (ref 150–400)
RBC: 2.66 Mil/uL — ABNORMAL LOW (ref 4.22–5.81)
RBC: 2.53 MIL/uL — ABNORMAL LOW (ref 4.22–5.81)
RDW: 17.2 % — ABNORMAL HIGH (ref 11.5–15.5)
RDW: 16.3 % — ABNORMAL HIGH (ref 11.5–15.5)
WBC: 7.9 10*3/uL (ref 4.0–10.5)
WBC: 7 10*3/uL (ref 4.0–10.5)

## 2014-11-17 LAB — BASIC METABOLIC PANEL
BUN: 21 mg/dL (ref 6–23)
CALCIUM: 9.4 mg/dL (ref 8.4–10.5)
CHLORIDE: 104 meq/L (ref 96–112)
CO2: 26 mEq/L (ref 19–32)
Creatinine, Ser: 0.9 mg/dL (ref 0.40–1.50)
GFR: 87.59 mL/min (ref >60.00)
Glucose, Bld: 133 mg/dL — ABNORMAL HIGH (ref 70–99)
POTASSIUM: 4.9 meq/L (ref 3.5–5.1)
Sodium: 139 mEq/L (ref 135–145)

## 2014-11-17 LAB — URINALYSIS, ROUTINE W REFLEX MICROSCOPIC
Bilirubin Urine: NEGATIVE
Hgb urine dipstick: NEGATIVE
KETONES UR: NEGATIVE
LEUKOCYTES UA: NEGATIVE
NITRITE: NEGATIVE
RBC / HPF: NONE SEEN (ref 0–?)
SPECIFIC GRAVITY, URINE: 1.015 (ref 1.000–1.030)
Total Protein, Urine: NEGATIVE
UROBILINOGEN UA: 0.2 (ref 0.0–1.0)
Urine Glucose: NEGATIVE
WBC, UA: NONE SEEN (ref 0–?)
pH: 7.5 (ref 5.0–8.0)

## 2014-11-17 LAB — VITAMIN B12: Vitamin B-12: 174 pg/mL — ABNORMAL LOW (ref 180–914)

## 2014-11-17 LAB — LIPID PANEL
CHOL/HDL RATIO: 2
CHOLESTEROL: 104 mg/dL (ref 0–200)
HDL: 43.9 mg/dL (ref >39.00)
LDL Cholesterol: 34 mg/dL (ref 0–99)
NONHDL: 60.1
TRIGLYCERIDES: 129 mg/dL (ref 0.0–149.0)
VLDL: 25.8 mg/dL (ref 0.0–40.0)

## 2014-11-17 LAB — GLUCOSE, CAPILLARY
GLUCOSE-CAPILLARY: 114 mg/dL — AB (ref 65–99)
Glucose-Capillary: 177 mg/dL — ABNORMAL HIGH (ref 65–99)

## 2014-11-17 LAB — PSA: PSA: 0.16 ng/mL (ref 0.10–4.00)

## 2014-11-17 LAB — HEPATIC FUNCTION PANEL
ALBUMIN: 4.1 g/dL (ref 3.5–5.2)
ALK PHOS: 34 U/L — AB (ref 39–117)
ALT: 13 U/L (ref 0–53)
AST: 15 U/L (ref 0–37)
BILIRUBIN DIRECT: 0.1 mg/dL (ref 0.0–0.3)
TOTAL PROTEIN: 7.1 g/dL (ref 6.0–8.3)
Total Bilirubin: 0.4 mg/dL (ref 0.2–1.2)

## 2014-11-17 LAB — IRON AND TIBC
Iron: 10 ug/dL — ABNORMAL LOW (ref 45–182)
Saturation Ratios: 2 % — ABNORMAL LOW (ref 17.9–39.5)
TIBC: 542 ug/dL — ABNORMAL HIGH (ref 250–450)
UIBC: 532 ug/dL

## 2014-11-17 LAB — MICROALBUMIN / CREATININE URINE RATIO
CREATININE, U: 142.6 mg/dL
MICROALB UR: 1.7 mg/dL (ref 0.0–1.9)
Microalb Creat Ratio: 1.2 mg/g (ref 0.0–30.0)

## 2014-11-17 LAB — TSH: TSH: 2.91 u[IU]/mL (ref 0.35–4.50)

## 2014-11-17 LAB — FOLATE: Folate: 32 ng/mL

## 2014-11-17 LAB — POC OCCULT BLOOD, ED: Fecal Occult Bld: NEGATIVE

## 2014-11-17 LAB — FERRITIN: Ferritin: 4 ng/mL — ABNORMAL LOW (ref 24–336)

## 2014-11-17 LAB — HEMOGLOBIN A1C: Hgb A1c MFr Bld: 7.1 % — ABNORMAL HIGH (ref 4.6–6.5)

## 2014-11-17 LAB — ABO/RH: ABO/RH(D): A POS

## 2014-11-17 LAB — PREPARE RBC (CROSSMATCH)

## 2014-11-17 LAB — RETICULOCYTES
RBC.: 2.56 MIL/uL — ABNORMAL LOW (ref 4.22–5.81)
Retic Count, Absolute: 66.6 K/uL (ref 19.0–186.0)
Retic Ct Pct: 2.6 % (ref 0.4–3.1)

## 2014-11-17 LAB — BRAIN NATRIURETIC PEPTIDE: B Natriuretic Peptide: 43.9 pg/mL (ref 0.0–100.0)

## 2014-11-17 MED ORDER — ACETAMINOPHEN 325 MG PO TABS
650.0000 mg | ORAL_TABLET | Freq: Four times a day (QID) | ORAL | Status: DC | PRN
  Filled 2014-11-17: qty 2

## 2014-11-17 MED ORDER — CARVEDILOL 25 MG PO TABS
25.0000 mg | ORAL_TABLET | Freq: Two times a day (BID) | ORAL | Status: DC
Start: 2014-11-17 — End: 2014-11-18
  Administered 2014-11-17 – 2014-11-18 (×2): 25 mg via ORAL
  Filled 2014-11-17 (×4): qty 1

## 2014-11-17 MED ORDER — INSULIN ASPART 100 UNIT/ML ~~LOC~~ SOLN
0.0000 [IU] | Freq: Three times a day (TID) | SUBCUTANEOUS | Status: DC
  Administered 2014-11-17: 2 [IU] via SUBCUTANEOUS

## 2014-11-17 MED ORDER — ONDANSETRON HCL 4 MG/2ML IJ SOLN: 4.0000 mg | Freq: Four times a day (QID) | INTRAMUSCULAR | Status: DC | PRN

## 2014-11-17 MED ORDER — INSULIN GLARGINE 100 UNIT/ML ~~LOC~~ SOLN
20.0000 [IU] | Freq: Every day | SUBCUTANEOUS | Status: DC
  Administered 2014-11-17 – 2014-11-18 (×2): 20 [IU] via SUBCUTANEOUS
  Filled 2014-11-17 (×3): qty 0.2

## 2014-11-17 MED ORDER — PANTOPRAZOLE SODIUM 40 MG PO TBEC
40.0000 mg | DELAYED_RELEASE_TABLET | Freq: Every day | ORAL | Status: DC
  Administered 2014-11-18: 40 mg via ORAL
  Filled 2014-11-17: qty 1

## 2014-11-17 MED ORDER — ONDANSETRON HCL 4 MG PO TABS: 4.0000 mg | ORAL_TABLET | Freq: Four times a day (QID) | ORAL | Status: DC | PRN

## 2014-11-17 MED ORDER — ATORVASTATIN CALCIUM 40 MG PO TABS
40.0000 mg | ORAL_TABLET | Freq: Every day | ORAL | Status: DC
  Administered 2014-11-17 – 2014-11-18 (×2): 40 mg via ORAL
  Filled 2014-11-17 (×2): qty 1

## 2014-11-17 MED ORDER — ASPIRIN EC 81 MG PO TBEC
81.0000 mg | DELAYED_RELEASE_TABLET | Freq: Every day | ORAL | Status: DC
  Administered 2014-11-17 – 2014-11-18 (×2): 81 mg via ORAL
  Filled 2014-11-17 (×2): qty 1

## 2014-11-17 MED ORDER — FUROSEMIDE 40 MG PO TABS
40.0000 mg | ORAL_TABLET | Freq: Every day | ORAL | Status: DC
  Administered 2014-11-18: 40 mg via ORAL
  Filled 2014-11-17: qty 1

## 2014-11-17 MED ORDER — ACETAMINOPHEN 650 MG RE SUPP: 650.0000 mg | Freq: Four times a day (QID) | RECTAL | Status: DC | PRN

## 2014-11-17 MED ORDER — INSULIN ASPART 100 UNIT/ML ~~LOC~~ SOLN: 0.0000 [IU] | Freq: Every day | SUBCUTANEOUS | Status: DC

## 2014-11-17 MED ORDER — FUROSEMIDE 10 MG/ML IJ SOLN
40.0000 mg | Freq: Once | INTRAMUSCULAR | Status: AC
  Administered 2014-11-17: 40 mg via INTRAVENOUS
  Filled 2014-11-17: qty 4

## 2014-11-17 MED ORDER — CALCIUM CARBONATE 1250 (500 CA) MG PO TABS
2.0000 | ORAL_TABLET | Freq: Every day | ORAL | Status: DC
  Administered 2014-11-17 – 2014-11-18 (×2): 1000 mg via ORAL
  Filled 2014-11-17 (×3): qty 2

## 2014-11-17 MED ORDER — MAGNESIUM OXIDE 400 (241.3 MG) MG PO TABS
400.0000 mg | ORAL_TABLET | Freq: Every day | ORAL | Status: DC
  Administered 2014-11-17 – 2014-11-18 (×2): 400 mg via ORAL
  Filled 2014-11-17 (×3): qty 1

## 2014-11-17 MED ORDER — CALCIUM-MAGNESIUM-ZINC 1000-400-15 MG PO TABS: 1.0000 | ORAL_TABLET | Freq: Every day | ORAL | Status: DC

## 2014-11-17 MED ORDER — ALUM & MAG HYDROXIDE-SIMETH 200-200-20 MG/5ML PO SUSP: 30.0000 mL | Freq: Four times a day (QID) | ORAL | Status: DC | PRN

## 2014-11-17 MED ORDER — ZINC SULFATE 220 (50 ZN) MG PO CAPS
220.0000 mg | ORAL_CAPSULE | Freq: Every day | ORAL | Status: DC
  Administered 2014-11-17 – 2014-11-18 (×2): 220 mg via ORAL
  Filled 2014-11-17 (×3): qty 1

## 2014-11-17 MED ORDER — METFORMIN HCL 850 MG PO TABS
850.0000 mg | ORAL_TABLET | Freq: Three times a day (TID) | ORAL | Status: DC
  Administered 2014-11-17 – 2014-11-18 (×2): 850 mg via ORAL
  Filled 2014-11-17 (×5): qty 1

## 2014-11-17 MED ORDER — PIOGLITAZONE HCL 45 MG PO TABS
45.0000 mg | ORAL_TABLET | Freq: Every day | ORAL | Status: DC
  Administered 2014-11-17: 45 mg via ORAL
  Filled 2014-11-17 (×2): qty 1

## 2014-11-17 MED ORDER — SODIUM CHLORIDE 0.9 % IV SOLN
10.0000 mL/h | Freq: Once | INTRAVENOUS | Status: AC
  Administered 2014-11-17: 10 mL/h via INTRAVENOUS

## 2014-11-17 NOTE — ED Notes (Addendum)
Pt states he was sent here from his PCP to receive a blood transfusion. Pt states he has felt SOB with exertion starting 1 month ago. Pt has ICD, states he has not had issue with SOB since his ICD was placed 3 years ago.

## 2014-11-17 NOTE — Telephone Encounter (Signed)
Received critical lab results  Hgb 6.4  Staff to call pt -  Needs to go to ER for symptomatic anemia, likely should have transfusion in ER, then probably home unless other issues found

## 2014-11-17 NOTE — ED Notes (Signed)
Pt paced on EKG

## 2014-11-17 NOTE — Assessment & Plan Note (Signed)

## 2014-11-17 NOTE — Patient Instructions (Addendum)
Please continue all other medications as before, and refills have been done if requested.  Please have the pharmacy call with any other refills you may need.  Please continue your efforts at being more active, low cholesterol diet, and weight control.  You are otherwise up to date with prevention measures today.  Please keep your appointments with your specialists as you may have planned  You will be contacted regarding the referral for: Home Health, Home oxygen 2L per nasal cannula continuous, CT chest, Dr Lenna Gilford  Please consider asking about Levemir instead of Lantus if less expensive  Please go to the LAB in the Basement (turn left off the elevator) for the tests to be done today  You will be contacted by phone if any changes need to be made immediately.  Otherwise, you will receive a letter about your results with an explanation, but please check with MyChart first.  Please remember to sign up for MyChart if you have not done so, as this will be important to you in the future with finding out test results, communicating by private email, and scheduling acute appointments online when needed.  Please return in 6 months, or sooner if needed, with Lab testing done 3-5 days before  We will have the office cancel the Aug 3 appt

## 2014-11-17 NOTE — Assessment & Plan Note (Addendum)
stable overall by history and exam, recent data reviewed with pt, and pt to continue medical treatment as before,  to f/u any worsening symptoms or concerns le Lab Results  Component Value Date   HGBA1C 7.7* 03/12/2014   Consider change to levemir if more affordable, pt to check with insurance at pharmacy

## 2014-11-17 NOTE — ED Provider Notes (Signed)
CSN: [id number]     Arrival date & time 11/17/14  1232 History   First MD Initiated Contact with Patient 11/17/14 1255     Chief Complaint  Patient presents with  . Sent by PCP for transfusion      (Consider location/radiation/quality/duration/timing/severity/associated sxs/prior Treatment) The history is provided by the patient.  QURAN VASCO is a 74 y.o. male hx of CHF with EF 30% with ICD, HTN, here with shortness of breath. Shortness breath with with exertion over the last 2-3 weeks. Denies any chest pain. Denies any leg swelling. Went to have a routine physical with his doctor today and was noticed to be anemic. Denies any vomiting or abdominal pain or rectal bleeding.    Past Medical History  Diagnosis Date  . Allergy   . GERD (gastroesophageal reflux disease)   . Hypertension   . Colon polyps 11/23/2010  . Barrett's esophagus 11/23/2010  . Abnormal ANCA test 07/03/2011  . Chronic systolic dysfunction of left ventricle     EF 30%  . Pulmonary fibrosis   . Pulmonary nodules   . Iron deficiency anemia 05/30/2011  . B12 deficiency anemia   . Bundle branch block, left   . ICD (implantable cardiac defibrillator) in place   . Shortness of breath     "related to heart being out of rhythm" (01/29/2012)  . Type II diabetes mellitus   . Arthritis     "mild; right shoulder" (01/29/2012)  . Liver abscess 1980's  . Nonischemic cardiomyopathy     s/p St. Jude BiV ICD 01/29/12  . Hyperlipidemia 10/14/2012  . Cataract     L eye surgery  . CHF (congestive heart failure)     pacemaker   Past Surgical History  Procedure Laterality Date  . Tonsillectomy  1952  . Cardiac defibrillator placement  01/29/2012    SJM Quadra Assura BiV ICD implanted by Dr Rayann Heman  . Abscess drained  ?1980's    liver  . Bi-ventricular implantable cardioverter defibrillator N/A 01/29/2012    Procedure: BI-VENTRICULAR IMPLANTABLE CARDIOVERTER DEFIBRILLATOR  (CRT-D);  Surgeon: Thompson Grayer, MD;  Location: [facility] CATH  LAB;  Service: Cardiovascular;  Laterality: N/A;  . Cataract extraction Left   . Colonoscopy    . Upper gi endoscopy     Family History  Problem Relation Age of Onset  . Heart disease Mother   . Colon cancer Neg Hx   . Esophageal cancer Neg Hx   . Stomach cancer Neg Hx    History  Substance Use Topics  . Smoking status: Former Smoker -- 0.50 packs/day for 10 years    Types: Cigarettes    Quit date: 04/23/1988  . Smokeless tobacco: Never Used  . Alcohol Use: No    Review of Systems  Respiratory: Positive for shortness of breath.   Neurological: Positive for weakness.  All other systems reviewed and are negative.     Allergies  Penicillins; Iodine; and Fish oil  Home Medications   Prior to Admission medications   Medication Sig Start Date End Date Taking? Authorizing Provider  aspirin EC 81 MG tablet Take 81 mg by mouth daily.   Yes Historical Provider, MD  atorvastatin (LIPITOR) 40 MG tablet TAKE ONE TABLET BY MOUTH ONCE DAILY 07/26/14  Yes Biagio Borg, MD  Calcium-Magnesium-Zinc 1000-400-15 MG TABS Take 1 tablet by mouth daily.    Yes Historical Provider, MD  carvedilol (COREG) 25 MG tablet Take 1 tablet (25 mg total) by mouth 2 (two) times daily  with a meal. 09/16/14  Yes Thayer Headings, MD  Cholecalciferol (VITAMIN D) 1000 UNITS capsule Take 1,000 Units by mouth daily.    Yes Historical Provider, MD  Cyanocobalamin (B-12 PO) Take 1 tablet by mouth daily.   Yes Historical Provider, MD  ferrous fumarate (HEMOCYTE - 106 MG FE) 325 (106 FE) MG TABS Take 1 tablet by mouth daily.   Yes Historical Provider, MD  furosemide (LASIX) 40 MG tablet TAKE ONE TABLET BY MOUTH ONCE DAILY 07/05/14  Yes Thayer Headings, MD  ibuprofen (ADVIL,MOTRIN) 200 MG tablet Take 200-300 mg by mouth 2 (two) times daily as needed. For pain   Yes Historical Provider, MD  Insulin Glargine (LANTUS SOLOSTAR) 100 UNIT/ML Solostar Pen Inject 44 Units into the skin daily. 03/15/14  Yes Biagio Borg, MD  Lutein  20 MG CAPS Take 20 mg by mouth daily.   Yes Historical Provider, MD  metFORMIN (GLUCOPHAGE) 850 MG tablet TAKE ONE TABLET BY MOUTH THREE TIMES DAILY 07/29/14  Yes Biagio Borg, MD  methocarbamol (ROBAXIN) 500 MG tablet Take 500 mg by mouth daily as needed for muscle spasms.   Yes Historical Provider, MD  Multiple Vitamin (MULTIVITAMIN WITH MINERALS) TABS Take 1 tablet by mouth daily.   Yes Historical Provider, MD  OMEGA 3 1000 MG CAPS Take 1,000 mg by mouth 2 (two) times daily.    Yes Historical Provider, MD  omeprazole (PRILOSEC) 20 MG capsule TAKE ONE CAPSULE BY MOUTH TWICE DAILY 07/15/14  Yes Biagio Borg, MD  pioglitazone (ACTOS) 45 MG tablet TAKE ONE TABLET BY MOUTH AT BEDTIME 07/15/14  Yes Biagio Borg, MD  LANTUS SOLOSTAR 100 UNIT/ML Solostar Pen INJECT 40 UNITS SUBCUTANEOUSLY DAILY Patient not taking: Reported on 11/17/2014 11/11/14   Biagio Borg, MD   BP 135/56 mmHg  Pulse 95  Temp(Src) 97.9 F (36.6 C) (Oral)  Resp 16  SpO2 91% Physical Exam  Constitutional: He is oriented to person, place, and time.  Chronically ill, pale   HENT:  Head: Normocephalic.  Mouth/Throat: Oropharynx is clear and moist.  Eyes: EOM are normal. Pupils are equal, round, and reactive to light.  Conjunctiva pale   Neck: Normal range of motion. Neck supple.  Cardiovascular: Normal rate, regular rhythm and normal heart sounds.   Pulmonary/Chest: Effort normal. No respiratory distress.  Diminished bilateral bases   Abdominal: Soft. Bowel sounds are normal. He exhibits no distension. There is no tenderness.  Musculoskeletal: Normal range of motion. He exhibits no edema.  Neurological: He is alert and oriented to person, place, and time.  Skin: Skin is warm and dry.  Psychiatric: He has a normal mood and affect. His behavior is normal. Judgment and thought content normal.  Nursing note and vitals reviewed.   ED Course  Procedures (including critical care time)  CRITICAL CARE Performed by: Darl Householder,  [place]   Total critical care time: 30 min   Critical care time was exclusive of separately billable procedures and treating other patients.  Critical care was necessary to treat or prevent imminent or life-threatening deterioration.  Critical care was time spent personally by me on the following activities: development of treatment plan with patient and/or surrogate as well as nursing, discussions with consultants, evaluation of patient's response to treatment, examination of patient, obtaining history from patient or surrogate, ordering and performing treatments and interventions, ordering and review of laboratory studies, ordering and review of radiographic studies, pulse oximetry and re-evaluation of patient's condition.   Labs Review Labs  Reviewed  CBC WITH DIFFERENTIAL/PLATELET - Abnormal; Notable for the following:    RBC 2.53 (*)    Hemoglobin 6.0 (*)    HCT 20.0 (*)    MCH 23.7 (*)    RDW 16.3 (*)    All other components within normal limits  COMPREHENSIVE METABOLIC PANEL - Abnormal; Notable for the following:    Glucose, Bld 194 (*)    BUN 23 (*)    Alkaline Phosphatase 34 (*)    All other components within normal limits  BRAIN NATRIURETIC PEPTIDE  I-STAT TROPOININ, ED  POC OCCULT BLOOD, ED  TYPE AND SCREEN  ABO/RH  PREPARE RBC (CROSSMATCH)    Imaging Review Dg Chest 2 View  11/17/2014   CLINICAL DATA:  Shortness of breath with exertion  EXAM: CHEST - 2 VIEW  COMPARISON:  08/26/2012, 01/29/2013  FINDINGS: Defibrillator is again identified and stable. The cardiac shadow is within normal limits. Bibasilar changes are noted increased from the previous exams likely representing in degree of acute on chronic atelectasis. No focal confluent infiltrate is seen. No acute bony abnormality is noted.  IMPRESSION: Acute atelectasis on chronic fibrotic changes.   Electronically Signed   By: Inez Catalina M.D.   On: 11/17/2014 13:39     EKG Interpretation   Date/Time:  Wednesday  November 17 2014 13:07:51 EDT Ventricular Rate:  84 PR Interval:  195 QRS Duration: 118 QT Interval:  400 QTC Calculation: 473 R Axis:   -53 Text Interpretation:  Atrial-sensed ventricular-paced rhythm No further  analysis attempted due to paced rhythm No previous ECGs available  Confirmed by Adela Esteban  MD, Bruin ([id number]) on 11/17/2014 1:26:13 PM      MDM   Final diagnoses:  None    EADEN HETTINGER is a 74 y.o. male here with SOB, hypoxia. Concerned for possible symptomatic anemia. Occ neg. Will get labs, type and likely need transfusion.   2:07 PM CXR showed atelectasis. Appears in mild volume overload. Occ neg. Hg 6. 2 U PRBC ordered. Will give lasix prior to transfusion to prevent further volume overload. Anemia panel ordered. Will admit.    Wandra Arthurs, MD 11/17/14 1430

## 2014-11-17 NOTE — Telephone Encounter (Signed)
Terry Peck  I used to see [name] few years ago. Got alert on ILD chanes on CXR. Please have him do full pft and come for fu  Thanks  Dr. Brand Males, M.D., [facility].C.P Pulmonary and Critical Care Medicine Staff Physician [facility] Pager: [phone], If no answer or between  15:00h - 7:00h: call [phone]  11/17/2014 5:58 PM

## 2014-11-17 NOTE — Progress Notes (Signed)
Subjective:    Patient ID: Terry Peck, male    DOB: 12/08/1940, 74 y.o.   MRN: [id number]  HPI    Here for wellness and f/u;  Overall doing ok;  Pt denies Chest pain, wheezing, orthopnea, PND, worsening LE edema, palpitations, dizziness or syncope.  Pt denies neurological change such as new headache, facial or extremity weakness.  Pt denies polydipsia, polyuria, or low sugar symptoms. Pt states overall good compliance with treatment and medications, good tolerability, and has been trying to follow appropriate diet.  Pt denies worsening depressive symptoms, suicidal ideation or panic. No fever, night sweats, wt loss, loss of appetite, or other constitutional symptoms.  Pt states good ability with ADL's, has low fall risk, home safety reviewed and adequate, no other significant changes in hearing or vision.  74yo M with hx of IPF here for f/u with DOE, has been seen per card recently and doing well, has gotten worse in the past month overall  - was able to mow the grass then, but gradually worsening now that today can only get from the chair to mailbox and back to chair with worsening unusual DOE.  Has a friend who has seen Dr Lenna Gilford and asks for referral instead of return to Dr Chase Caller, just concerned he may have copd too.  No overt bleeding, no anticoagulant. Pt denies chest pain, increased sob or doe, wheezing, orthopnea, PND, increased LE swelling, palpitations, dizziness or syncope except for the above.  Pt denies new neurological symptoms such as new headache, or facial or extremity weakness or numbness   Pt denies polydipsia, polyuria, and lantus seems to work well, but now getting more expensive, now up to $90/mo.  Has been working on wt loss intentionally, lost 8 lbs in past mo.. Wt Readings from Last 3 Encounters:  11/17/14 188 lb 4 oz (85.39 kg)  11/01/14 194 lb 9.6 oz (88.27 kg)  09/06/14 198 lb (89.812 kg)   Past Medical History  Diagnosis Date  . Allergy   . GERD (gastroesophageal  reflux disease)   . Hypertension   . Colon polyps 11/23/2010  . Barrett's esophagus 11/23/2010  . Abnormal ANCA test 07/03/2011  . Chronic systolic dysfunction of left ventricle     EF 30%  . Pulmonary fibrosis   . Pulmonary nodules   . Iron deficiency anemia 05/30/2011  . B12 deficiency anemia   . Bundle branch block, left   . ICD (implantable cardiac defibrillator) in place   . Shortness of breath     "related to heart being out of rhythm" (01/29/2012)  . Type II diabetes mellitus   . Arthritis     "mild; right shoulder" (01/29/2012)  . Liver abscess 1980's  . Nonischemic cardiomyopathy     s/p St. Jude BiV ICD 01/29/12  . Hyperlipidemia 10/14/2012  . Cataract     L eye surgery  . CHF (congestive heart failure)     pacemaker   Past Surgical History  Procedure Laterality Date  . Tonsillectomy  1952  . Cardiac defibrillator placement  01/29/2012    SJM Quadra Assura BiV ICD implanted by Dr Rayann Heman  . Abscess drained  ?1980's    liver  . Bi-ventricular implantable cardioverter defibrillator N/A 01/29/2012    Procedure: BI-VENTRICULAR IMPLANTABLE CARDIOVERTER DEFIBRILLATOR  (CRT-D);  Surgeon: Thompson Grayer, MD;  Location: [facility] CATH LAB;  Service: Cardiovascular;  Laterality: N/A;  . Cataract extraction Left   . Colonoscopy    . Upper gi endoscopy  reports that he quit smoking about 26 years ago. His smoking use included Cigarettes. He has a 5 pack-year smoking history. He has never used smokeless tobacco. He reports that he does not drink alcohol or use illicit drugs. family history includes Heart disease in his mother. There is no history of Colon cancer, Esophageal cancer, or Stomach cancer. Allergies  Allergen Reactions  . Penicillins Anaphylaxis and Other (See Comments)    "swelling of eyes; throat; could breath good" (01/29/2012)  . Iodine Other (See Comments)    "if I get iodine dye, I'll get a fever" (01/29/2012)   Current Outpatient Prescriptions on File Prior to Visit    Medication Sig Dispense Refill  . aspirin EC 81 MG tablet Take 81 mg by mouth daily.    Marland Kitchen atorvastatin (LIPITOR) 40 MG tablet TAKE ONE TABLET BY MOUTH ONCE DAILY 90 tablet 1  . Calcium-Magnesium-Zinc 1000-400-15 MG TABS Take 1 tablet by mouth daily.     . carvedilol (COREG) 25 MG tablet Take 1 tablet (25 mg total) by mouth 2 (two) times daily with a meal. 180 tablet 3  . Cholecalciferol (VITAMIN D) 1000 UNITS capsule Take 1,000 Units by mouth daily.     . ferrous fumarate (HEMOCYTE - 106 MG FE) 325 (106 FE) MG TABS Take 1 tablet by mouth daily.    . furosemide (LASIX) 40 MG tablet TAKE ONE TABLET BY MOUTH ONCE DAILY 30 tablet 5  . Glucose Blood (BAYER [name] 2 TEST) DISK Test four times daily as directed 200 each 11  . ibuprofen (ADVIL,MOTRIN) 200 MG tablet Take 200-300 mg by mouth 2 (two) times daily as needed. For pain    . Insulin Glargine (LANTUS SOLOSTAR) 100 UNIT/ML Solostar Pen Inject 44 Units into the skin daily. 3 pen 3  . Insulin Pen Needle (BD PEN NEEDLE NANO U/F) 32G X 4 MM MISC USE ONE PEN NEEDLE SUBCUTANEOUSLY EVERY DAY DX 250.02 100 each 3  . LANTUS SOLOSTAR 100 UNIT/ML Solostar Pen INJECT 40 UNITS SUBCUTANEOUSLY DAILY 15 mL 0  . Lutein 20 MG CAPS Take 20 mg by mouth daily.    . metFORMIN (GLUCOPHAGE) 850 MG tablet TAKE ONE TABLET BY MOUTH THREE TIMES DAILY 270 tablet 2  . methocarbamol (ROBAXIN) 500 MG tablet Take 500 mg by mouth daily as needed for muscle spasms.    . Multiple Vitamin (MULTIVITAMIN WITH MINERALS) TABS Take 1 tablet by mouth daily.    . OMEGA 3 1000 MG CAPS Take 1,000 mg by mouth 2 (two) times daily.     Marland Kitchen omeprazole (PRILOSEC) 20 MG capsule TAKE ONE CAPSULE BY MOUTH TWICE DAILY 180 capsule 2  . OVER THE COUNTER MEDICATION Take 5,000 mcg by mouth daily. Vitamin B 12 5000 mcg tablet    . pioglitazone (ACTOS) 45 MG tablet TAKE ONE TABLET BY MOUTH AT BEDTIME 90 tablet 2  . [DISCONTINUED] simvastatin (ZOCOR) 80 MG tablet Take 1 tablet (80 mg total) by mouth at  bedtime. 90 tablet 3   No current facility-administered medications on file prior to visit.    Review of Systems Constitutional: Negative for increased diaphoresis, other activity, appetite or siginficant weight change other than noted HENT: Negative for worsening hearing loss, ear pain, facial swelling, mouth sores and neck stiffness.   Eyes: Negative for other worsening pain, redness or visual disturbance.  Respiratory: Negative for shortness of breath and wheezing  Cardiovascular: Negative for chest pain and palpitations.  Gastrointestinal: Negative for diarrhea, blood in stool, abdominal distention or other  pain Genitourinary: Negative for hematuria, flank pain or change in urine volume.  Musculoskeletal: Negative for myalgias or other joint complaints.  Skin: Negative for color change and wound or drainage.  Neurological: Negative for syncope and numbness. other than noted Hematological: Negative for adenopathy. or other swelling Psychiatric/Behavioral: Negative for hallucinations, SI, self-injury, decreased concentration or other worsening agitation.      Objective:   Physical Exam BP 152/78 mmHg  Pulse 77  Temp(Src) 97.8 F (36.6 C)  Wt 188 lb 4 oz (85.39 kg)  SpO2 89% - RA at rest:  Ambulatory o2 sat on RA : 85%, then o2 sat at rest on 2L - 98% VS noted,  Constitutional: Pt is oriented to person, place, and time. Appears well-developed and well-nourished, in no significant distress Head: Normocephalic and atraumatic.  Right Ear: External ear normal.  Left Ear: External ear normal.  Nose: Nose normal.  Mouth/Throat: Oropharynx is clear and moist.  Eyes: Conjunctivae and EOM are normal. Pupils are equal, round, and reactive to light.  Neck: Normal range of motion. Neck supple. No JVD present. No tracheal deviation present or significant neck LA or mass Cardiovascular: Normal rate, regular rhythm, normal heart sounds and intact distal pulses.   Pulmonary/Chest: Effort  normal and breath sounds decreased with bibas rales right > left, no wheezing  Abdominal: Soft. Bowel sounds are normal. NT. No HSM  Musculoskeletal: Normal range of motion. Exhibits no edema.  Lymphadenopathy:  Has no cervical adenopathy.  Neurological: Pt is alert and oriented to person, place, and time. Pt has normal reflexes. No cranial nerve deficit. Motor grossly intact Skin: Skin is warm and dry. No rash noted.  Psychiatric:  Has normal mood and affect. Behavior is normal.   November 01, 2014 Pro B Natriuretic peptide (BNP) 0.0 - 100.0 pg/mL 48.0 36.0 85.0 190.0 (H)       Sodium 135 - 145 mEq/L 139 139 139 140 140 138     Potassium 3.5 - 5.1 mEq/L 4.6 5.0 4.6 4.6 4.5 4.1    Chloride 96 - 112 mEq/L 106 104 102 107 101 104    CO2 19 - 32 mEq/L 24 26 28 27 28 25     Glucose, Bld 70 - 99 mg/dL 208 (H) 112 (H) 123 (H) 161 (H) 153 (H) 147 (H)    BUN 6 - 23 mg/dL 25 (H) 19 16 20 22 20     Creatinine, Ser 0.40 - 1.50 mg/dL 0.99 0.8R 0.8R 0.9R 1.0R 0.9R    Calcium 8.4 - 10.5 mg/dL 9.2 9.8 9.6 9.0 9.7 9.4    GFR >60.00 mL/min 78.47              *RADIOLOGY REPORT*  Clinical Data: Follow-up pulmonary nodule  CT CHEST WITHOUT CONTRAST  Technique: Multidetector CT imaging of the chest was performed following the standard protocol without IV contrast.  Comparison: Chest radiograph 01/30/2012, CT 09/12/2011, CT 05/31/2011  Findings: The area of left upper lobe perifissural area of ground- glass opacity is subjectively similar to the more recent comparison exam, current image 29, and does not demonstrate a nodular configuration or appearance substantially different from other subpleural areas of interstitial/ground-glass opacity. 6 mm left lower lobe pulmonary nodule image 25 is stable. Stable areas of subpleural reticular markings and cystic change with a peripheral and basilar prominence. Stable mild predominately bibasilar central bronchiectasis.  Artifact from  left-sided defibrillator again noted. Heart size is normal. Coronary arterial calcifications or stent. No pericardial or pleural effusion. No lymphadenopathy. Atherosclerotic  aortic calcification without aneurysm. Small hiatal hernia. Left upper renal pole cortical presumed cyst partly visualized. No acute osseous finding.  IMPRESSION: No change in subpleural reticular and cystic changes as above, which may be seen with usual interstitial pneumonitis, idiopathic pulmonary fibrosis, or hypersensitivity pneumonitis.  Stable 6 mm left lower lobe pulmonary nodule, no persistent nodularity to the previously seen subpleural area of left upper lobe ground-glass airspace opacity. Follow-up chest CT is recommended in 1 year to demonstrate 2-year stability and establish probable benignity.   Original Report Authenticated By: Conchita Paris, M.D.           Vitals     Height Weight BMI (Calculated)    5\' 8"  (1.727 m) 185 lb 8 oz (84.142 kg) 28.3      Interpretation Summary     *RADIOLOGY REPORT*  Clinical Data: Follow-up pulmonary nodule  CT CHEST WITHOUT CONTRAST  Technique: Multidetector CT imaging of the chest was performed following the standard protocol without IV contrast.  Comparison: Chest radiograph 01/30/2012, CT 09/12/2011, CT 05/31/2011  Findings: The area of left upper lobe perifissural area of ground- glass opacity is subjectively similar to the more recent comparison exam, current image 29, and does not demonstrate a nodular configuration or appearance substantially different from other subpleural areas of interstitial/ground-glass opacity. 6 mm left lower lobe pulmonary nodule image 25 is stable. Stable areas of subpleural reticular markings and cystic change with a peripheral and basilar prominence. Stable mild predominately bibasilar central bronchiectasis.  Artifact from left-sided defibrillator again noted. Heart size  is normal. Coronary arterial calcifications or stent. No pericardial or pleural effusion. No lymphadenopathy. Atherosclerotic aortic calcification without aneurysm. Small hiatal hernia. Left upper renal pole cortical presumed cyst partly visualized. No acute osseous finding.  IMPRESSION: No change in subpleural reticular and cystic changes as above, which may be seen with usual interstitial pneumonitis, idiopathic pulmonary fibrosis, or hypersensitivity pneumonitis.  Stable 6 mm left lower lobe pulmonary nodule, no persistent nodularity to the previously seen subpleural area of left upper lobe ground-glass airspace opacity. Follow-up chest CT is recommended in 1 year to demonstrate 2-year stability and establish probable benignity.   Original Report Authenticated By: Conchita Paris, M.D.       Assessment & Plan:

## 2014-11-17 NOTE — H&P (Addendum)
[facility] History and Physical  Terry Peck XBM:[id number] DOB: [birth year] DOA: 11/17/2014   PCP: Cathlean Cower, MD    Chief Complaint: shortness of breath  HPI: Terry Peck is a 74 y.o. male with a history of anemia secondary to iron deficiency and B-12 deficiency. Other past medical history: Pulmonary fibrosis, Barrett's esophagus with GERD, chronic systolic heart failure with an EF of 30%  He has been short of breath for 3 weeks now. His PCP did some blood work and has found him to be anemic and therefore, he was referred to the ER. Upon further discussion, it appears that he supposed to be taking Ferrous fumarate which is mentioned on his home medication list. He states that he stopped taking this about a year ago. He has not noted any blood in his stool. Stool heme occult is negative He has a remote history of gastric ulcers and has Barrett's esophagus and has been taking his Prilosec appropriately. He has not had any epigastric pain or hematemesis. He is being admitted for blood transfusion for symptomatic anemia.   General: The patient denies anorexia, fever, has been trying to lose weight and has about 5-8 lbs of weight loss Cardiac: Denies chest pain, syncope, palpitations, pedal edema  Respiratory: Denies cough, shortness of breath, wheezing GI: Denies severe indigestion/heartburn, abdominal pain, nausea, vomiting, diarrhea and constipation GU: Denies hematuria, incontinence, dysuria  Musculoskeletal: Denies arthritis  Skin: Denies suspicious skin lesions Neurologic: Denies focal weakness or numbness, change in vision Psychiatry: Denies depression or anxiety. Hematologic: no easy bruising or bleeding  All other systems reviewed and found to be negative.  Past Medical History  Diagnosis Date  . GERD (gastroesophageal reflux disease)   . Hypertension   . Colon polyps 11/23/2010  . Barrett's esophagus 11/23/2010  . Abnormal ANCA test 07/03/2011  . Chronic systolic  dysfunction of left ventricle     EF 30%  . Pulmonary fibrosis   . Pulmonary nodules   . Iron deficiency anemia 05/30/2011  . B12 deficiency anemia   . Bundle branch block, left   . ICD (implantable cardiac defibrillator) and Pacemaker   . Type II diabetes mellitus   . Arthritis     "mild; right shoulder" (01/29/2012)  . Liver abscess 1980's  . Nonischemic cardiomyopathy     s/p [employer] Jude BiV ICD 01/29/12  . Hyperlipidemia 10/14/2012  . Cataract     L eye surgery    Past Surgical History  Procedure Laterality Date  . Tonsillectomy  1952  . Cardiac defibrillator placement  01/29/2012    SJM Quadra Assura BiV ICD implanted by Dr Rayann Heman  . Abscess drained  ?1980's    liver  . Bi-ventricular implantable cardioverter defibrillator N/A 01/29/2012    Procedure: BI-VENTRICULAR IMPLANTABLE CARDIOVERTER DEFIBRILLATOR  (CRT-D);  Surgeon: Thompson Grayer, MD;  Location: [facility] CATH LAB;  Service: Cardiovascular;  Laterality: N/A;  . Cataract extraction Left   . Colonoscopy    . Upper gi endoscopy      Social History: does not smoke or drink alcohol Lives at home, retired    Allergies  Allergen Reactions  . Penicillins Anaphylaxis and Other (See Comments)    "swelling of eyes; throat; could breath good" (01/29/2012)  . Iodine Other (See Comments)    "if I get iodine dye, I'll get a fever" (01/29/2012)  . Fish Oil Itching    Pt said only when given through IV    Family history:   Family History  Problem Relation  Age of Onset  . Heart disease Mother   . Colon cancer Neg Hx   . Esophageal cancer Neg Hx   . Stomach cancer Neg Hx       Prior to Admission medications   Medication Sig Start Date End Date Taking? Authorizing Provider  aspirin EC 81 MG tablet Take 81 mg by mouth daily.   Yes Historical Provider, MD  atorvastatin (LIPITOR) 40 MG tablet TAKE ONE TABLET BY MOUTH ONCE DAILY 07/26/14  Yes Biagio Borg, MD  Calcium-Magnesium-Zinc 1000-400-15 MG TABS Take 1 tablet by mouth daily.     Yes Historical Provider, MD  carvedilol (COREG) 25 MG tablet Take 1 tablet (25 mg total) by mouth 2 (two) times daily with a meal. 09/16/14  Yes Thayer Headings, MD  Cholecalciferol (VITAMIN D) 1000 UNITS capsule Take 1,000 Units by mouth daily.    Yes Historical Provider, MD  Cyanocobalamin (B-12 PO) Take 1 tablet by mouth daily.   Yes Historical Provider, MD  ferrous fumarate (HEMOCYTE - 106 MG FE) 325 (106 FE) MG TABS Take 1 tablet by mouth daily.   Yes Historical Provider, MD  furosemide (LASIX) 40 MG tablet TAKE ONE TABLET BY MOUTH ONCE DAILY 07/05/14  Yes Thayer Headings, MD  ibuprofen (ADVIL,MOTRIN) 200 MG tablet Take 200-300 mg by mouth 2 (two) times daily as needed. For pain   Yes Historical Provider, MD  Insulin Glargine (LANTUS SOLOSTAR) 100 UNIT/ML Solostar Pen Inject 44 Units into the skin daily. 03/15/14  Yes Biagio Borg, MD  Lutein 20 MG CAPS Take 20 mg by mouth daily.   Yes Historical Provider, MD  metFORMIN (GLUCOPHAGE) 850 MG tablet TAKE ONE TABLET BY MOUTH THREE TIMES DAILY 07/29/14  Yes Biagio Borg, MD  methocarbamol (ROBAXIN) 500 MG tablet Take 500 mg by mouth daily as needed for muscle spasms.   Yes Historical Provider, MD  Multiple Vitamin (MULTIVITAMIN WITH MINERALS) TABS Take 1 tablet by mouth daily.   Yes Historical Provider, MD  OMEGA 3 1000 MG CAPS Take 1,000 mg by mouth 2 (two) times daily.    Yes Historical Provider, MD  omeprazole (PRILOSEC) 20 MG capsule TAKE ONE CAPSULE BY MOUTH TWICE DAILY 07/15/14  Yes Biagio Borg, MD  pioglitazone (ACTOS) 45 MG tablet TAKE ONE TABLET BY MOUTH AT BEDTIME 07/15/14  Yes Biagio Borg, MD  LANTUS SOLOSTAR 100 UNIT/ML Solostar Pen INJECT 40 UNITS SUBCUTANEOUSLY DAILY Patient not taking: Reported on 11/17/2014 11/11/14   Biagio Borg, MD     Physical Exam: Filed Vitals:   11/17/14 1237  BP: 135/56  Pulse: 95  Temp: 97.9 F (36.6 C)  TempSrc: Oral  Resp: 16  SpO2: 91%     General: AAO x 3  HEENT: Normocephalic and Atraumatic,  Mucous membranes pink                PERRLA; EOM intact; No scleral icterus,                 Nares: Patent, Oropharynx: Clear, Fair Dentition                 Neck: FROM, no cervical lymphadenopathy, thyromegaly, carotid bruit or JVD;  Breasts: deferred CHEST WALL: No tenderness  CHEST: Normal respiration, clear to auscultation bilaterally  HEART: Regular rate and rhythm; no murmurs rubs or gallops  BACK: No kyphosis or scoliosis; no CVA tenderness  GI: Positive Bowel Sounds, soft, non-tender; no masses, no organomegaly Rectal Exam: deferred MSK:  No cyanosis, clubbing, or edema Genitalia: not examined  SKIN:  no rash or ulceration  CNS: Alert and Oriented x 4, Nonfocal exam, CN 2-12 intact  Labs on Admission:  Basic Metabolic Panel:  Recent Labs Lab 11/17/14 1048 11/17/14 1305  NA 139 137  K 4.9 4.2  CL 104 104  CO2 26 22  GLUCOSE 133* 194*  BUN 21 23*  CREATININE 0.90 0.94  CALCIUM 9.4 9.2   Liver Function Tests:  Recent Labs Lab 11/17/14 1048 11/17/14 1305  AST 15 23  ALT 13 17  ALKPHOS 34* 34*  BILITOT 0.4 0.5  PROT 7.1 7.4  ALBUMIN 4.1 3.8   No results for input(s): LIPASE, AMYLASE in the last 168 hours. No results for input(s): AMMONIA in the last 168 hours. CBC:  Recent Labs Lab 11/17/14 1048 11/17/14 1305  WBC 7.9 7.0  NEUTROABS 5.4 4.5  HGB 6.4* 6.0*  HCT 20.3* 20.0*  MCV 76.6* 79.1  PLT 291.0 270   Cardiac Enzymes: No results for input(s): CKTOTAL, CKMB, CKMBINDEX, TROPONINI in the last 168 hours.  BNP (last 3 results)  Recent Labs  11/17/14 1305  BNP 43.9    ProBNP (last 3 results)  Recent Labs  11/01/14 1611  PROBNP 48.0    CBG: No results for input(s): GLUCAP in the last 168 hours.  Radiological Exams on Admission: Dg Chest 2 View  11/17/2014   CLINICAL DATA:  Shortness of breath with exertion  EXAM: CHEST - 2 VIEW  COMPARISON:  08/26/2012, 01/29/2013  FINDINGS: Defibrillator is again identified and stable. The cardiac  shadow is within normal limits. Bibasilar changes are noted increased from the previous exams likely representing in degree of acute on chronic atelectasis. No focal confluent infiltrate is seen. No acute bony abnormality is noted.  IMPRESSION: Acute atelectasis on chronic fibrotic changes.   Electronically Signed   By: Inez Catalina M.D.   On: 11/17/2014 13:39    EKG: Independently reviewed. Atrial paced rhythm  Assessment/Plan Principal Problem:   Symptomatic anemia -He has a history of iron and B-12 deficiency anemia-he continues to take his B-12 but has not taken his iron in about a year -Hemoglobin is 6. 0- when last checked in November of 2015 it was 12.9 -Stool heme occult is negative -Anemia panel has been ordered-he is recommended to go back on his iron as well -We'll transfuse 2 units of packed red blood cells to get hemoglobin greater than 8 and hopefully discharge tomorrow morning  Active Problems:   Diabetes -A1c 7.1 -We'll decrease Lantus to half dose as he has not eaten anything all day and sugars are well controlled-continue metformin    Barrett's esophagus -Continue PPI    IPF (idiopathic pulmonary fibrosis) -Not currently on oxygen    Chronic systolic congestive heart failure -EF 30%-euvolemic -He has not taken his furosemide today-he will receive 40 units of IV furosemide due to the blood transfusions that will be given to him-we'll resume oral furosemide for tomorrow morning    Automatic implantable cardioverter-defibrillator in situ    Consulted:   Code Status: Full code Family Communication: Wife at bedside  DVT Prophylaxis: SCDs  Time spent: 65 min  Perry, MD [facility]  If 7PM-7AM, please contact night-coverage [email or web address] 11/17/2014, 3:01 PM

## 2014-11-17 NOTE — Progress Notes (Signed)
Pre visit review using our clinic review tool, if applicable. No additional management support is needed unless otherwise documented below in the visit note. 

## 2014-11-17 NOTE — Telephone Encounter (Signed)
Talked with patient, wife will be driving, patient is going to ED for transfusion

## 2014-11-17 NOTE — Assessment & Plan Note (Signed)
Likely benign, to re-check with CT

## 2014-11-17 NOTE — Assessment & Plan Note (Signed)
Suspect recent progression, now worse symptomatic IPF likely chronic, to start Home o2 2L due to baseline borderline hypoxemia worsening desat with ambulatoin , then improved with O2 as documented; for Home helath nurse and home o2 2L, also for F/u CT chest, also refer Dr Lenna Gilford per pt request, doubt inhalers helpful at this time, and no other specific tx such as antibx or steroid

## 2014-11-18 DIAGNOSIS — D509 Iron deficiency anemia, unspecified: Secondary | ICD-10-CM

## 2014-11-18 DIAGNOSIS — Z95810 Presence of automatic (implantable) cardiac defibrillator: Secondary | ICD-10-CM

## 2014-11-18 LAB — BASIC METABOLIC PANEL
ANION GAP: 8 (ref 5–15)
BUN: 26 mg/dL — ABNORMAL HIGH (ref 6–20)
CO2: 26 mmol/L (ref 22–32)
CREATININE: 0.87 mg/dL (ref 0.61–1.24)
Calcium: 9.5 mg/dL (ref 8.9–10.3)
Chloride: 106 mmol/L (ref 101–111)
GFR calc Af Amer: >60 mL/min (ref >60)
GFR calc non Af Amer: >60 mL/min (ref >60)
GLUCOSE: 110 mg/dL — AB (ref 65–99)
POTASSIUM: 3.9 mmol/L (ref 3.5–5.1)
Sodium: 140 mmol/L (ref 135–145)

## 2014-11-18 LAB — CBC
HCT: 25.9 % — ABNORMAL LOW (ref 39.0–52.0)
Hemoglobin: 8.2 g/dL — ABNORMAL LOW (ref 13.0–17.0)
MCH: 26 pg (ref 26.0–34.0)
MCHC: 31.7 g/dL (ref 30.0–36.0)
MCV: 82.2 fL (ref 78.0–100.0)
PLATELETS: 243 10*3/uL (ref 150–400)
RBC: 3.15 MIL/uL — ABNORMAL LOW (ref 4.22–5.81)
RDW: 16.2 % — AB (ref 11.5–15.5)
WBC: 7.1 10*3/uL (ref 4.0–10.5)

## 2014-11-18 LAB — GLUCOSE, CAPILLARY: Glucose-Capillary: 107 mg/dL — ABNORMAL HIGH (ref 65–99)

## 2014-11-18 MED ORDER — FERROUS SULFATE 325 (65 FE) MG PO TABS: 325.0000 mg | ORAL_TABLET | Freq: Three times a day (TID) | ORAL | Status: DC

## 2014-11-18 NOTE — Telephone Encounter (Signed)
lmtcb for pt.  

## 2014-11-18 NOTE — Discharge Summary (Signed)
Physician Discharge Summary  Terry Peck UTM:[id number] DOB: 08-17-1940 DOA: 11/17/2014  PCP: Cathlean Cower, MD  Admit date: 11/17/2014 Discharge date: 11/18/2014  Time spent: 50 minutes  Recommendations for Outpatient Follow-up:  1. Have resumed oral Iron   Discharge Condition: stable Diet recommendation: diabetic, low sodium, heart healthy   Discharge Diagnoses:  Principal Problem:   Symptomatic anemia Active Problems:   Diabetes   Barrett's esophagus   Iron deficiency anemia   IPF (idiopathic pulmonary fibrosis)   Chronic systolic congestive heart failure   Automatic implantable cardioverter-defibrillator in situ   History of present illness:  Terry Peck is a 74 y.o. male with a history of anemia secondary to iron deficiency and B-12 deficiency. Other past medical history: Pulmonary fibrosis, Barrett's esophagus with GERD, chronic systolic heart failure with an EF of 30% He has been short of breath for 3 weeks now. His PCP did some blood work and has found him to be anemic and therefore, he was referred to the ER. Upon further discussion, it appears that he supposed to be taking Ferrous fumarate which is mentioned on his home medication list. He states that he stopped taking this about a year ago. He has not noted any blood in his stool. Stool heme occult is negative He has a remote history of gastric ulcers and has Barrett's esophagus and has been taking his Prilosec appropriately. He has not had any epigastric pain or hematemesis. He was admitted for blood transfusion for symptomatic anemia.  Hospital Course:  Principal Problem:  Symptomatic anemia -He has a history of iron and B-12 deficiency anemia-he continues to take his B-12 but has not taken his iron in about a year -Hemoglobin is 6. 0- when last checked in November of 2015 it was 12.9 -Stool heme occult is negative - transfused 2 U PRBC- Hb today 8.2 -Anemia panel reveals severe Iron deficiency see below- the  blood transfusions that he has received should give him some Iron- I have recommended him multiple times to go back on his oral iron as well Iron/TIBC/Ferritin/ %Sat    Component Value Date/Time   IRON 10* 11/17/2014 1306   TIBC 542* 11/17/2014 1306   FERRITIN 4* 11/17/2014 1306   IRONPCTSAT 2* 11/17/2014 1306     Active Problems:  Diabetes -A1c 7.1 - continue lantus and metformin   Barrett's esophagus -Continue PPI   IPF (idiopathic pulmonary fibrosis) -Not currently on oxygen   Chronic systolic congestive heart failure -EF 30%-euvolemic -cont Lasix at home   Automatic implantable cardioverter-defibrillator in situ   Discharge Exam: Filed Weights   11/17/14 1610  Weight: 82.9 kg (182 lb 12.2 oz)   Filed Vitals:   11/18/14 0547  BP: 122/52  Pulse: 65  Temp: 97.9 F (36.6 C)  Resp: 22    General: AAO x 3, no distress Cardiovascular: RRR, no murmurs  Respiratory: clear to auscultation bilaterally GI: soft, non-tender, non-distended, bowel sound positive  Discharge Instructions You were cared for by a hospitalist during your hospital stay. If you have any questions about your discharge medications or the care you received while you were in the hospital after you are discharged, you can call the unit and asked to speak with the hospitalist on call if the hospitalist that took care of you is not available. Once you are discharged, your primary care physician will handle any further medical issues. Please note that NO REFILLS for any discharge medications will be authorized once you are discharged, as it is  imperative that you return to your primary care physician (or establish a relationship with a primary care physician if you do not have one) for your aftercare needs so that they can reassess your need for medications and monitor your lab values.  Discharge Instructions    Discharge instructions    Complete by:  As directed   Low sodium heart healthy diabetic  diet     Increase activity slowly    Complete by:  As directed             Medication List    TAKE these medications        aspirin EC 81 MG tablet  Take 81 mg by mouth daily.     atorvastatin 40 MG tablet  Commonly known as:  LIPITOR  TAKE ONE TABLET BY MOUTH ONCE DAILY     B-12 PO  Take 1 tablet by mouth daily.     Calcium-Magnesium-Zinc 1000-400-15 MG Tabs  Take 1 tablet by mouth daily.     carvedilol 25 MG tablet  Commonly known as:  COREG  Take 1 tablet (25 mg total) by mouth 2 (two) times daily with a meal.     ferrous sulfate 325 (65 FE) MG tablet  Take 1 tablet (325 mg total) by mouth 3 (three) times daily with meals.     furosemide 40 MG tablet  Commonly known as:  LASIX  TAKE ONE TABLET BY MOUTH ONCE DAILY     ibuprofen 200 MG tablet  Commonly known as:  ADVIL,MOTRIN  Take 200-300 mg by mouth 2 (two) times daily as needed. For pain     Insulin Glargine 100 UNIT/ML Solostar Pen  Commonly known as:  LANTUS SOLOSTAR  Inject 44 Units into the skin daily.     Lutein 20 MG Caps  Take 20 mg by mouth daily.     metFORMIN 850 MG tablet  Commonly known as:  GLUCOPHAGE  TAKE ONE TABLET BY MOUTH THREE TIMES DAILY     methocarbamol 500 MG tablet  Commonly known as:  ROBAXIN  Take 500 mg by mouth daily as needed for muscle spasms.     multivitamin with minerals Tabs tablet  Take 1 tablet by mouth daily.     Omega 3 1000 MG Caps  Take 1,000 mg by mouth 2 (two) times daily.     omeprazole 20 MG capsule  Commonly known as:  PRILOSEC  TAKE ONE CAPSULE BY MOUTH TWICE DAILY     pioglitazone 45 MG tablet  Commonly known as:  ACTOS  TAKE ONE TABLET BY MOUTH AT BEDTIME     Vitamin D 1000 UNITS capsule  Take 1,000 Units by mouth daily.       Allergies  Allergen Reactions  . Penicillins Anaphylaxis and Other (See Comments)    "swelling of eyes; throat; could breath good" (01/29/2012)  . Iodine Other (See Comments)    "if I get iodine dye, I'll get a  fever" (01/29/2012)  . Fish Oil Itching    Pt said only when given through IV      The results of significant diagnostics from this hospitalization (including imaging, microbiology, ancillary and laboratory) are listed below for reference.    Significant Diagnostic Studies: Dg Chest 2 View  11/17/2014   CLINICAL DATA:  Shortness of breath with exertion  EXAM: CHEST - 2 VIEW  COMPARISON:  08/26/2012, 01/29/2013  FINDINGS: Defibrillator is again identified and stable. The cardiac shadow is within normal limits. Bibasilar changes are  noted increased from the previous exams likely representing in degree of acute on chronic atelectasis. No focal confluent infiltrate is seen. No acute bony abnormality is noted.  IMPRESSION: Acute atelectasis on chronic fibrotic changes.   Electronically Signed   By: Inez Catalina M.D.   On: 11/17/2014 13:39    Microbiology: No results found for this or any previous visit (from the past 240 hour(s)).   Labs: Basic Metabolic Panel:  Recent Labs Lab 11/17/14 1048 11/17/14 1305 11/18/14 0531  NA 139 137 140  K 4.9 4.2 3.9  CL 104 104 106  CO2 26 22 26   GLUCOSE 133* 194* 110*  BUN 21 23* 26*  CREATININE 0.90 0.94 0.87  CALCIUM 9.4 9.2 9.5   Liver Function Tests:  Recent Labs Lab 11/17/14 1048 11/17/14 1305  AST 15 23  ALT 13 17  ALKPHOS 34* 34*  BILITOT 0.4 0.5  PROT 7.1 7.4  ALBUMIN 4.1 3.8   No results for input(s): LIPASE, AMYLASE in the last 168 hours. No results for input(s): AMMONIA in the last 168 hours. CBC:  Recent Labs Lab 11/17/14 1048 11/17/14 1305 11/18/14 0531  WBC 7.9 7.0 7.1  NEUTROABS 5.4 4.5  --   HGB 6.4* 6.0* 8.2*  HCT 20.3* 20.0* 25.9*  MCV 76.6* 79.1 82.2  PLT 291.0 270 243   Cardiac Enzymes: No results for input(s): CKTOTAL, CKMB, CKMBINDEX, TROPONINI in the last 168 hours. BNP: BNP (last 3 results)  Recent Labs  11/17/14 1305  BNP 43.9    ProBNP (last 3 results)  Recent Labs  11/01/14 1611   PROBNP 48.0    CBG:  Recent Labs Lab 11/17/14 1655 11/17/14 2118 11/18/14 0739  GLUCAP 177* 114* 107*       Signed:  Debbe Odea, MD [facility] 11/18/2014, 7:49 AM

## 2014-11-18 NOTE — Progress Notes (Signed)
Nursing Discharge Summary  Patient ID: ELIYOHU CLASS MRN: [id number] DOB/AGE: 10-03-1940 74 y.o.  Admit date: 11/17/2014 Discharge date: 11/18/2014  Discharged Condition: good  Disposition: 01-Home or Self Care    Prescriptions Given: No prescriptions given. Iron called into the pharmacy. Follow up appointments and medications discussed. Wife and patient both verbalized understanding  Means of Discharge: patient to be taken downstairs via wheelchair to be discharged home.   Signed: Buel Ream 11/18/2014, 9:51 AM

## 2014-11-19 ENCOUNTER — Telehealth: Payer: Self-pay

## 2014-11-19 NOTE — Telephone Encounter (Signed)
TCM list for SOB and Pulmonary Fibrosis with hypoxia DCed on 11/18/14 Pt to follow up with Pulmonology. Appt is scheduled.

## 2014-11-21 LAB — TYPE AND SCREEN
ABO/RH(D): A POS
Antibody Screen: NEGATIVE
Unit division: 0
Unit division: 0
Unit division: 0

## 2014-11-22 NOTE — Telephone Encounter (Signed)
lmomtcb  

## 2014-11-23 NOTE — Telephone Encounter (Signed)
Called and spoke to pt. Informed pt of the recs per MR. Pt was referred by pt's PCP to Dr. Lenna Gilford for pulmonary nodule and DOE. Advised pt we can schedule appt with MR as he is familiar with pt's case since he was seen in the past by MR. Pt stated he would rather stay with Dr. Lenna Gilford. Pt has appt with SN on 8.9.16. Nothing further needed at this time.   Will send to MR as FYI.

## 2014-11-24 ENCOUNTER — Ambulatory Visit: Payer: Medicare Other | Admitting: Internal Medicine

## 2014-11-30 ENCOUNTER — Encounter: Payer: Self-pay | Admitting: Pulmonary Disease

## 2014-11-30 ENCOUNTER — Ambulatory Visit (INDEPENDENT_AMBULATORY_CARE_PROVIDER_SITE_OTHER): Payer: Medicare Other | Admitting: Pulmonary Disease

## 2014-11-30 ENCOUNTER — Other Ambulatory Visit (INDEPENDENT_AMBULATORY_CARE_PROVIDER_SITE_OTHER): Payer: Medicare Other

## 2014-11-30 VITALS — BP 130/80 | HR 76 | Temp 97.1°F | Ht 68.0 in | Wt 191.6 lb

## 2014-11-30 DIAGNOSIS — I5022 Chronic systolic (congestive) heart failure: Secondary | ICD-10-CM | POA: Diagnosis not present

## 2014-11-30 DIAGNOSIS — I119 Hypertensive heart disease without heart failure: Secondary | ICD-10-CM | POA: Diagnosis not present

## 2014-11-30 DIAGNOSIS — D509 Iron deficiency anemia, unspecified: Secondary | ICD-10-CM

## 2014-11-30 DIAGNOSIS — D649 Anemia, unspecified: Secondary | ICD-10-CM

## 2014-11-30 DIAGNOSIS — I447 Left bundle-branch block, unspecified: Secondary | ICD-10-CM

## 2014-11-30 DIAGNOSIS — E538 Deficiency of other specified B group vitamins: Secondary | ICD-10-CM

## 2014-11-30 DIAGNOSIS — K219 Gastro-esophageal reflux disease without esophagitis: Secondary | ICD-10-CM

## 2014-11-30 DIAGNOSIS — J84112 Idiopathic pulmonary fibrosis: Secondary | ICD-10-CM

## 2014-11-30 DIAGNOSIS — K2270 Barrett's esophagus without dysplasia: Secondary | ICD-10-CM

## 2014-11-30 DIAGNOSIS — R911 Solitary pulmonary nodule: Secondary | ICD-10-CM

## 2014-11-30 DIAGNOSIS — Z95810 Presence of automatic (implantable) cardiac defibrillator: Secondary | ICD-10-CM

## 2014-11-30 LAB — CBC WITH DIFFERENTIAL/PLATELET
BASOS PCT: 0.8 % (ref 0.0–3.0)
Basophils Absolute: 0.1 10*3/uL (ref 0.0–0.1)
Eosinophils Absolute: 0.3 10*3/uL (ref 0.0–0.7)
Eosinophils Relative: 4.9 % (ref 0.0–5.0)
HEMATOCRIT: 31.4 % — AB (ref 39.0–52.0)
HEMOGLOBIN: 9.9 g/dL — AB (ref 13.0–17.0)
LYMPHS ABS: 1.8 10*3/uL (ref 0.7–4.0)
Lymphocytes Relative: 26.8 % (ref 12.0–46.0)
MCHC: 31.5 g/dL (ref 30.0–36.0)
MCV: 80.7 fl (ref 78.0–100.0)
Monocytes Absolute: 0.8 10*3/uL (ref 0.1–1.0)
Monocytes Relative: 11.9 % (ref 3.0–12.0)
NEUTROS PCT: 55.6 % (ref 43.0–77.0)
Neutro Abs: 3.6 10*3/uL (ref 1.4–7.7)
PLATELETS: 371 10*3/uL (ref 150.0–400.0)
RBC: 3.89 Mil/uL — ABNORMAL LOW (ref 4.22–5.81)
RDW: 19.6 % — ABNORMAL HIGH (ref 11.5–15.5)
WBC: 6.6 10*3/uL (ref 4.0–10.5)

## 2014-11-30 NOTE — Patient Instructions (Signed)
Aadon- it was great meeting you today...  We discussed your shortness of breath, pulmonary fibrosis, lung nodule, heart problems and the ANEMIA...    You can see how they all interplay to produce your symptoms...  Today we rechecked a breathing test, oxygen saturation test (with exercise), and some blood work (f/u blood count)... You have a CT Chest pending tomorrow & I will review that when it's completed...    We will contact you w/ the results when available to see if any additional therapy is indicated...  In the meanwhile>     OK to cut the Iron tab to twice daily...    Please increase your B12 supplement to one 1034mcg tablet daily...       We will recheck levels in several months...  Gradually increase your exercise program (like you did during pulmonary rehab in the past)...  Call for any questions...  Let's plan a follow up visit in 78mo, sooner if needed for problems.Marland KitchenMarland Kitchen

## 2014-12-01 ENCOUNTER — Ambulatory Visit (INDEPENDENT_AMBULATORY_CARE_PROVIDER_SITE_OTHER)
Admission: RE | Admit: 2014-12-01 | Discharge: 2014-12-01 | Disposition: A | Discharge status: Home / Self Care | Payer: Medicare Other | Source: Ambulatory Visit | Attending: Internal Medicine | Admitting: Internal Medicine

## 2014-12-01 DIAGNOSIS — R0609 Other forms of dyspnea: Secondary | ICD-10-CM | POA: Diagnosis not present

## 2014-12-01 DIAGNOSIS — R911 Solitary pulmonary nodule: Secondary | ICD-10-CM

## 2014-12-02 ENCOUNTER — Encounter: Payer: Self-pay | Admitting: Internal Medicine

## 2014-12-02 ENCOUNTER — Ambulatory Visit (INDEPENDENT_AMBULATORY_CARE_PROVIDER_SITE_OTHER): Payer: Medicare Other | Admitting: *Deleted

## 2014-12-02 DIAGNOSIS — I5022 Chronic systolic (congestive) heart failure: Secondary | ICD-10-CM

## 2014-12-02 DIAGNOSIS — Z95810 Presence of automatic (implantable) cardiac defibrillator: Secondary | ICD-10-CM | POA: Diagnosis not present

## 2014-12-02 NOTE — Progress Notes (Signed)
Remote ICD transmission.   

## 2014-12-06 ENCOUNTER — Encounter: Payer: Self-pay | Admitting: Pulmonary Disease

## 2014-12-06 ENCOUNTER — Encounter: Payer: Self-pay | Admitting: *Deleted

## 2014-12-06 NOTE — Progress Notes (Signed)
Subjective:     Patient ID: Terry Peck, male   DOB: 1940/04/28, 74 y.o.   MRN: [id number]  HPI 74 y/o WM, referred by Dr. Cathlean Cower for increased SOB>> He is a friend of my pt Terry Peck...             Terry Peck was prev evaluated in 2013-14 by MR & notes summarized below>>  IOV 06/20/2011- Referred for lung nodule and dyspnea:    74year old male. reports that he quit smoking about 23 years ago. His smoking use included Cigarettes. He has a 5 pack-year smoking history (1/2 pack per day x 10 years). He has never used smokeless tobacco. Body mass index is 28.15 kg/(m^2). Known DM with nocturia - started on insulin 06/20/2011, HBP, Arthritis, Barretts- seeing Dr Ardis Hughs    Repoirts hx of small lung nodule with serial CT fu at [facility] for 2 years at [facility] ending 5 years ago. Stable and dc from followup. Does not remember which side    Reports insidious onset of DOE x 1year. Progressive gradually. Improved by rest. Notices when he mows yard, walking briskly one flight of stairs notices dyspnea. Always improved by rest. Wife notices he is not able to keep up with her during walks outside the home. No associated weight gain, or admissions to hospitals, no new medical problems diagnosed since onset of symptoms. Denies associated chest pain, cough, fever, weight gain, hemoptysis, edema, orthopnea, paroxysmal nocturnal dyspnea, wheezing. Systolic function was moderately to severely reduced.     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 Has active GERD. Denies collagen vascular disease. No steel dust, metal dust or asbestos exposure. Denies amio intake. Denies chemo. Does not have pet birds or feathery pillows     ECHO - 05/31/11: ejection fraction was in the range of 30% to 35%. There is hypokinesis of the inferior and inferoseptal myocardium. Doppler parameters are consistent with abnormal left ventricular relaxation (grade 1 diastolic dysfunction).  Ventricular septum: These changes are consistent with a left bundle branch block. Mitral valve: Mildly calcified annulus. Mildly thickened leaflets. Pulmonary arteries: Systolic pressure was mildly increased. PA peak pressure: 67mm Hg.     CT CHEST 05/31/2011 Findings: No enlarged axillary or supraclavicular lymph nodes. There is no enlarged mediastinal or hilar lymph nodes. No pericardial or pleural effusion identified. There is a patulous and fluid-filled thoracic esophagus. There is peripheral and basilar predominant interstitial reticulation. Bibasilar subpleural honeycombing is also identified. Mild bronchiectasis is present in the lung bases. Ground-glass attenuation nodular density is noted within the left upper lobe measuring 1.8 cm, image 29. Solid nodule within left lower lobe measures 6 mm. Review of the visualized osseous structures is unremarkable. Limited imaging through the upper abdomen is significant for a left renal cyst. Only partially visualized and incompletely characterized without IV contrast. IMPRESSION: 1. 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 2. Ground-glass attenuating nodule within the left upper lobe is nonspecific and may be related to interstitial lung disease. Low grade pulmonary adenocarcinoma may have a similar appearance. Follow-up imaging in 3 months is recommended to ensure stability.      PFT 06/06/11:Signs of restrictions c/w the ILD we see on CT present even though TLC 5L/81% is normal. This is because FVC 2.9L/69% and reduced (fev1 is 2.45L/86% and normal). ratip is 84 and normal. DLCO is reduced 11/50%  #lung nodule  - please sign release for [facility] to  send CD rom of CT chest  - likely not cancer but if we cannot get [facility] records we have to follow this iwht CT in 9 months  #Pulmonary fibrosis  - do not know why but acid reflux could have played a role  - please Serum: ESR, ANA, DS-DNA, RF, anti-CCP, ssA,  ssB, scl-70, ANCA, Total CK, Aldolase, Hypersensitivity Pneumonitis Panel, ACE level  - will call you with results to decide next step  #Shortness of breath  - both due to heart and lung issues  - between the two the heart is more important priority - please see cardiology ASAP  - our Nurse will ensure you have an appointment next few days    OV 07/19/2011:    Review dyspnea related to ILD NOS and new chronic systolic CHF. Since last visit has seen cardiology Dr Acie Fredrickson. States that with cards med mgmt: dyspnea improved a "little bit". Able to do more. Can now climb 2 flights of stairs non-stop (last visit only 1 flight of stairs). Saw Dr Acie Fredrickson yesterday 07/18/11: - NICM suspected- 07/11/11: Abnormal stress nuclear study: Dilated left ventricle with global hypokinesis. Suspect small mild area of reversible ischemia in mid anterior wall. The degree of severe LV dysfunction is out of proportion to the mild ischemia noted. LV Ejection Fraction: 29%. LV Wall Motion: Severe global hypokinesis. Meds adjusted. Cath on hold. Plan to place pacer if no improvement in EF with med mgmt. In temrs of workup for pulmonary fibrosis:     LABS 06/20/11 auto-immune test result: all negative including HP pa  nel except anca is trace positive at 1:20 which is the cut off between negative and positive. ESR 29 #lung nodule  - there are 2 spots in lung and there is possibiloty this could be lung cancer  - we absolultey need the scan fom [facility]; not just report but the CD rom of CT chest  - see if you can get it to [place] next 2 weeks if not repeat ct chest by Aug 28, 2011 (3rd months CT)  #Pulmonary fibrosis  - do not know why but acid reflux could have played a role  - at this point we will follow this - you need breathing test in 6 months or so .  - we will also use the CT chest results for the nodule to follow the pulmonary fibrosis  #Shortness of breath  - both due to heart and lung issues  - between  the two the heart is more important priority - Follow Dr Acie Fredrickson advise  - I will check with your cardiologist about suitability to attend pulmonary rehab at [facility]    OV 09/10/2011    Followup pulmonary nodule, ILD non-UIP pattern NOS and dyspnea due to ILD and Systolic CHF: Last visit was 2 months ago. In terms of nodule, we have been unable to get the actual cd rom from [facility]. So, he will need fu testing here. In terms of pulm fibrosis, Bx Plans placed on hold due to cardiac status at time of last ov. I learn from 08/01/11 Dr Acie Fredrickson cards notes that plan is for continued medical mgmt. His dyspnea is improved a lot due to great med mgmt and bnp 80s as of 08/01/11. He is able to mow yard now. He thinks dietary portion control and resultant weight loss has also helped with dyspnea. #lung nodule  - there are 2 spots in lung and there is possibiloty this could be lung cancer  -  because we could not get scan from [facility], please do CT chest wihtout contrast now  #Pulmonary fibrosis  - do not know why but acid reflux could have played a role  - repeat anca blood test because this was trace positive and will help [place] know if this played a role  - depending on findings on CT chest, might have to consider lung biopsy  - you need breathing test in 6 months or so .  #Shortness of breath  - both due to heart and lung issues; glad you are better  - between the two- the heart is more important priority- Follow Dr Acie Fredrickson advise  - Dr Acie Fredrickson gave okay and will refer you to pulmonary rehab  #Overweight  - follow [facility] Diet sheet I discussed with you; stick to foods in left lane    Phne call 09/13/11:    ANCA blood test repeat result negative    CT scan: - lung nodule report - nodule smaller than before; & scarring or fibrosis in lung - stable as before.   OV 01/09/2012    Dyspnea: Did 12 visits with rehab that helped immensely with dyspnea. Then $ issues. Walking at home 4mles non-stop at home with  wife with o2 holding. PFT 01/09/12 - fvc 3.11/74%, fev1 2.6L/93% ratipo 84, TLC 73%, DLCO 11/53%. Of note, the FVC is significantly better than feb 2013 (? Weight loss effect) but dlco is same. In terms of weight: lost 9# on [facility] low glycemic diet (6# since MArch 2013) . Tolerating diet well. No headaches. No hunger pangs. GERD resolved. Soft stools ++. Overall this makes him feel better despite fact echo 12/19/11 is unchangd with LOW Systolic EF 356%and elevated PASP 377m Hg -- EF conitnues to be low and he has been referred for pacemaker. #weight  - continue low glycemic diet  - as you continue to lose weight, talk to pcp JaCathlean CowerMD about slowly coming off diabetic drugs  #lung nodule  - next ct chest is May 2014  #Lung fibrosis and shortness of breath  - glad shortness of breath better  - contnue exercise and diet  - glad you had flu shot  - will hold off lung biopsy conversation due to risk of bx with EF 35%; atleast till EP eval complete or there is evidence of progression on PFT    OV 09/17/2012:      Had pacer October 2013: and since then dyspnea significantly better/ Also had ECHO 07/28/12: shows EF improved to 55% and shows some LVH. After above, not having dyspnea for yard work. Able to do all yard work without sitting. Before was sitting 2-3 times. Currently no dyspnea or cough. Feels good.    PFT 01/09/12 - fvc 3.11/74%, fev1 2.6L/93% ratipo 84, TLC 73%, DLCO 11/53%    PFT 09/16/2012. FVC 2.9 L/ 70%. FEV1 2.5 L/93%. Ratio is 86/118%. Total lung capacity is 4.4 L/70%. DLCO is reduced at 40.7/54%. Essentially shows restriction with low diffusion    EKG Jan 2014: Qtc > 500 msec    CT chest 08/26/2012: My impression is UIP> IMPRESSION: No change in subpleural reticular and cystic changes as above, which may be seen with usual interstitial pneumonitis, idiopathic pulmonary fibrosis, or hypersensitivity pneumonitis.  Stable 6 mm left lower lobe pulmonary nodule, no persistent  nodularity to the previously seen subpleural area of left upper lobe ground-glass airspace opacity.     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 Age > [age]ith subpleural, bibasal  densities and in my opinion basal honey combing as well. Autoimmune is negative. Dyspnea resolved/improved significantly after pacer. I explained the disease and diagnosis to him. Explained, that high odds of IPF and diseaes is progressive, and ultimatly fatal and could be debilitating. Recommended that we screen him for pirfenidone via [employer] He is in agreement. He seems to meet criteria for the EAP program except Jan 2014 EKG showed long Qtc. Patient is interested.    ==>Ultimately he was not a candidate for Pirfenidone due to his cardiac problems.        ~  November 30, 2014:  Pulmonary consult w/ SN>        Terry Peck was recently [facility] 7/27- 11/18/14 by [facility] due to severe anemia; he states he developed recurrent SOB in mid-July and eval by PCP showed Hg=6; prior to this he was mowing his yard & stable; he denied any bleeding or change in stools; hosp eval revealed low MCV, Fe=10 (2%sat), Ferritin=4 (this suggests long slow ooze), B12=174, Folate=32; stool check in hosp was said to be NEG for blood...  He was supposed to be on Iron supplement but had stopped this about 18yr before;  He is followed for GI by DrJacobs- had EGD 09/06/14 showing 3-4cmHH & Barrett's epithelium (Bx= no dysplasia or malignancy); he has been taking Prilosec20Bid regularly;  Last colonoscopy was 07/18/11 showing 2 sessile polyps- one hyperplastic, one tub adenoma & f/u planned 24yrs... He was disch on FeSO4 Tid => Breathing is OK at rest, DOE w/ walking/ mowing=> now improved since transfusion; he denies cough, sputum/ hemoptysis; he denies CP/ palpit/ edema;  Also needs oral B12 supplement daily w/ f/u B12 level per protocol...       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      Current Meds> ASA81, Coreg25Bid, Lasix40, Atorva40, Metform850Tid, Actos45, Lantus44, Omep20Bid...  EXAM reveals Afeb, VSS, O2sat=93% on RA at rest;  HEENT- neg, mallampati2;  Chest- bibasilar rales ~1/2 way up, no wheezing or rhonchi;  Heart- RR gr 1/6 SEM w/o r/g;  Abd soft, neg;  Ext- neg w/o c/c/e...  CXR 11/17/14 showed norm heart size, AICD on left, bibasilar fibrosis vs atx sl incr from prev...  Spirometry 11/30/14 showed FVC=2.24 (54%), FEV1=1.97 (62%), %1sec=88, mid-flows are wnl at 125% predicted... (Vol & flow are both reduced from prev).  Ambulatory oxygen saturation test 11/30/14> O2sat=94% on RA at rest w/ pulse=60/min; he ambulated 2 laps & stopped w/ dyspnea; lowest O2sat=87% w/ pulse of 80/min...   CT Chest 12/01/14 showed LUL ground-glass nodule is less prominent, LLL 30mm nodule is unchanged, chronic ILD which appears slightly progressed in the periphery of both lungs; heart size is normal, extensive coronary artery calcification & AICD in place...  LABS 11/30/14 showed Hg=9.9 I reviewed extensive old records from [facility], PCP, [place]- including CXRs, CT scans, PFTs, and Labs> and discussed w/ pt & wife face-to-face during our 64min appt...  IMP/PLAN>>  Terry Peck recent incr in SOB was clearly due to his severe anemia which in turn was likely related to a slow ooze from his HH/ Barrett's mucosa and the fact that he stopped his oral iron therapy ~25yr ago;  He is feeling sl better after the 2uTx & oral  iron Rx w/ Hg up tp ~10 now;  He is asked to stay on the Fe Bid & B12 1087mch/d... Having noted this & the recent Cards f/u indicating stable status- his 5yr f/u CT Chest and spirometry does indicate some progression of his ILD... I am in favor of considering some treatment for his pulm fibrosis- he was deemed  not a candidate for pirfenedone due to his cardiac problems & AICD;  I outlined 3 options to the pt & his wife>  1) Conservative approach- continue observation to see how his dypnea improves w/ Fe & an exercise program;  2) Middle-of-the-road approach- perhaps considering an ISC like Pulmicort;  3) Aggressive approach- revisit eligibility for Pirfenedone vs trial of oral Pred... They clearly lean in the conservative direction & Terry Peck does not want additional meds at this time; he wants to wait until his Hg has ret to normal & to see how his exercise program progresses... We plan ROV in 3 mo w/ CXR.     Past Medical History  Diagnosis Date  . Allergy   . GERD (gastroesophageal reflux disease)   . Hypertension   . Colon polyps 11/23/2010  . Barrett's esophagus 11/23/2010  . Abnormal ANCA test 07/03/2011  . Chronic systolic dysfunction of left ventricle     EF 30%  . Pulmonary fibrosis   . Pulmonary nodules   . Iron deficiency anemia 05/30/2011  . B12 deficiency anemia   . Bundle branch block, left   . ICD (implantable cardiac defibrillator) in place   . Shortness of breath     "related to heart being out of rhythm" (01/29/2012)  . Type II diabetes mellitus   . Arthritis     "mild; right shoulder" (01/29/2012)  . Liver abscess 1980's  . Nonischemic cardiomyopathy     s/p St. Jude BiV ICD 01/29/12  . Hyperlipidemia 10/14/2012  . Cataract     L eye surgery  . CHF (congestive heart failure)     pacemaker    Past Surgical History  Procedure Laterality Date  . Tonsillectomy  1952  . Cardiac defibrillator placement  01/29/2012    SJM Quadra Assura BiV ICD implanted by Dr Rayann Heman  . Abscess drained  ?1980's    liver  . Bi-ventricular implantable cardioverter defibrillator N/A 01/29/2012    Procedure: BI-VENTRICULAR IMPLANTABLE CARDIOVERTER DEFIBRILLATOR  (CRT-D);  Surgeon: Thompson Grayer, MD;  Location: [facility] CATH LAB;  Service: Cardiovascular;  Laterality: N/A;  . Cataract extraction Left   .  Colonoscopy    . Upper gi endoscopy      Outpatient Encounter Prescriptions as of 11/30/2014  Medication Sig  . aspirin EC 81 MG tablet Take 81 mg by mouth daily.  Marland Kitchen atorvastatin (LIPITOR) 40 MG tablet TAKE ONE TABLET BY MOUTH ONCE DAILY  . Calcium-Magnesium-Zinc 1000-400-15 MG TABS Take 1 tablet by mouth daily.   . carvedilol (COREG) 25 MG tablet Take 1 tablet (25 mg total) by mouth 2 (two) times daily with a meal.  . Cholecalciferol (VITAMIN D) 1000 UNITS capsule Take 1,000 Units by mouth daily.   . Cyanocobalamin (B-12 PO) Take 1 tablet by mouth daily.  . ferrous sulfate 325 (65 FE) MG tablet Take 1 tablet (325 mg total) by mouth 3 (three) times daily with meals.  . furosemide (LASIX) 40 MG tablet TAKE ONE TABLET BY MOUTH ONCE DAILY  . ibuprofen (ADVIL,MOTRIN) 200 MG tablet Take 200-300 mg by mouth 2 (two) times daily as needed. For pain  . Insulin Glargine (  LANTUS SOLOSTAR) 100 UNIT/ML Solostar Pen Inject 44 Units into the skin daily.  . Lutein 20 MG CAPS Take 20 mg by mouth daily.  . metFORMIN (GLUCOPHAGE) 850 MG tablet TAKE ONE TABLET BY MOUTH THREE TIMES DAILY  . methocarbamol (ROBAXIN) 500 MG tablet Take 500 mg by mouth daily as needed for muscle spasms.  . Multiple Vitamin (MULTIVITAMIN WITH MINERALS) TABS Take 1 tablet by mouth daily.  . OMEGA 3 1000 MG CAPS Take 1,000 mg by mouth 2 (two) times daily.   Marland Kitchen omeprazole (PRILOSEC) 20 MG capsule TAKE ONE CAPSULE BY MOUTH TWICE DAILY  . pioglitazone (ACTOS) 45 MG tablet TAKE ONE TABLET BY MOUTH AT BEDTIME   No facility-administered encounter medications on file as of 11/30/2014.    Allergies  Allergen Reactions  . Penicillins Anaphylaxis and Other (See Comments)    "swelling of eyes; throat; could breath good" (01/29/2012)  . Iodine Other (See Comments)    "if I get iodine dye, I'll get a fever" (01/29/2012)  . Fish Oil Itching    Pt said only when given through IV    Immunization History  Administered Date(s) Administered  .  Influenza Split 01/03/2012  . Influenza Whole 05/03/2011  . Influenza,inj,Quad PF,36+ Mos 03/24/2013, 12/09/2013  . Pneumococcal Conjugate-13 10/21/2008, 04/14/2013  . Pneumococcal Polysaccharide-23 01/03/2012  . Tdap 11/23/2010  . Zoster 04/14/2013    Family History  Problem Relation Age of Onset  . Heart disease Mother   . Colon cancer Neg Hx   . Esophageal cancer Neg Hx   . Stomach cancer Neg Hx     Social History   Social History  . Marital Status: Married    Spouse Name: N/A  . Number of Children: 2  . Years of Education: 16   Occupational History  . Shipping/Receiving Set designer Mathematics--Retired    Social History Main Topics  . Smoking status: Former Smoker -- 0.50 packs/day for 10 years    Types: Cigarettes    Quit date: 04/23/1988  . Smokeless tobacco: Never Used  . Alcohol Use: No  . Drug Use: No  . Sexual Activity: Not Currently   Other Topics Concern  . Not on file   Social History Narrative   Lives in [place] with spouse.  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      Current Medications, Allergies, Past Medical History, Past Surgical History, Family History, and Social History were reviewed in [employer] record.   Review of Systems            All symptoms NEG except where BOLDED >>  Constitutional:  F/C/S, fatigue, anorexia, unexpected weight change. HEENT:  HA, visual changes, hearing loss, earache, nasal symptoms, sore throat, mouth sores, hoarseness. Resp:  cough, sputum, hemoptysis; SOB, tightness, wheezing. Cardio:  CP, palpit, DOE, orthopnea, edema. GI:  N/V/D/C, blood in stool; reflux, abd pain, distention, gas. GU:  dysuria, freq, urgency, hematuria, flank pain, voiding difficulty. MS:  joint pain, swelling, tenderness, decr ROM; neck pain, back pain, etc. Neuro:  HA, tremors, seizures, dizziness, syncope, weakness, numbness, gait abn. Skin:  suspicious  lesions or skin rash. Heme:  adenopathy, bruising, bleeding. Psyche:  confusion, agitation, sleep disturbance, hallucinations, anxiety, depression suicidal.   Objective:   Physical Exam      Vital Signs:  Reviewed...  General:  WD, WN, 74 y/o WM in NAD; alert & oriented; pleasant & cooperative... HEENT:  [place]/AT; Conjunctiva- pink,  Sclera- nonicteric, EOM-wnl, PERRLA, EACs-clear, TMs-wnl; NOSE-clear; THROAT-clear & wnl. Neck:  Supple w/ fair ROM; no JVD; normal carotid impulses w/o bruits; no thyromegaly or nodules palpated; no lymphadenopathy. Chest:  decr BS bilat w/ velcro rales bilat ~1/2 way up; no wheezing or rhonchi, no signs of consolidation... Heart:  AICD on left, Regular Rhythm; gr 1/6 SEM w/o rubs or gallops detected. Abdomen:  Soft & nontender- no guarding or rebound; normal bowel sounds; no organomegaly or masses palpated Ext:  decrROM; without deformities +arthritic changes; no varicose veins, venous insuffic, or edema;  Pulses intact w/o bruits. Neuro:  No focal neuro deficits; sensory testing normal; gait normal & balance OK. Derm:  No lesions noted; no rash etc. Lymph:  No cervical, supraclavicular, axillary, or inguinal adenopathy palpated.   Assessment:      IMP >>     Multifactorial dyspnea -- fibrosis, CHF, anemia, deconditioning all playing a roll     Pulmonary fibrosis -- sl progressive per 49yr CT follow up (see below)    Pulmonary nodules -- stable on recent CT Chest    NICM w/ chr sys CHF, LBBB -- per DrNasher, stable    AICD placed -- stable    Anemia- Iron defic & low B12 -- likely slow ooze from HH/ Barrett's & absorption issues -- rec FeSO4 Bid and B12 1075mcg/d regularly, stay on PPI Bid as well...    Other medical problems as noted> HL, IDDM (watch Actos rx w/ CHF diagnosis)  PLAN >>  Terry Peck recent incr in SOB was clearly due to his severe anemia which in turn was likely related to a slow ooze from his HH/ Barrett's mucosa and the fact that he stopped  his oral iron therapy ~23yr ago;  He is feeling sl better after the 2uTx & oral iron Rx w/ Hg up tp ~10 now;  He is asked to stay on the Fe Bid & B12 1088mch/d... Having noted this & the recent Cards f/u indicating stable status- his 46yr f/u CT Chest and spirometry does indicate some progression of his ILD... 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 They clearly lean in the conservative direction & Terry Peck does not want additional meds at this time; he wants to wait until his Hg has ret to normal & to see how his exercise program progresses... We plan ROV in 3 mo w/ CXR.     Plan:     Patient's Medications  New Prescriptions   No medications on file  Previous Medications   ASPIRIN EC 81 MG TABLET    Take 81 mg by mouth daily.   ATORVASTATIN (LIPITOR) 40 MG TABLET    TAKE ONE TABLET BY MOUTH ONCE DAILY   CALCIUM-MAGNESIUM-ZINC 1000-400-15 MG TABS    Take 1 tablet by mouth daily.    CARVEDILOL (COREG) 25 MG TABLET    Take 1 tablet (25 mg total) by mouth 2 (two) times daily with a meal.   CHOLECALCIFEROL (VITAMIN D) 1000 UNITS CAPSULE    Take 1,000 Units by mouth daily.    CYANOCOBALAMIN (B-12 PO)  Take 1 tablet by mouth daily.   FERROUS SULFATE 325 (65 FE) MG TABLET    Take 1 tablet (325 mg total) by mouth 3 (three) times daily with meals.   FUROSEMIDE (LASIX) 40 MG TABLET    TAKE ONE TABLET BY MOUTH ONCE DAILY   IBUPROFEN (ADVIL,MOTRIN) 200 MG TABLET    Take 200-300 mg by mouth 2 (two) times daily as needed. For pain   INSULIN GLARGINE (LANTUS SOLOSTAR) 100 UNIT/ML SOLOSTAR PEN     Inject 44 Units into the skin daily.   LUTEIN 20 MG CAPS    Take 20 mg by mouth daily.   METFORMIN (GLUCOPHAGE) 850 MG TABLET    TAKE ONE TABLET BY MOUTH THREE TIMES DAILY   METHOCARBAMOL (ROBAXIN) 500 MG TABLET    Take 500 mg by mouth daily as needed for muscle spasms.   MULTIPLE VITAMIN (MULTIVITAMIN WITH MINERALS) TABS    Take 1 tablet by mouth daily.   OMEGA 3 1000 MG CAPS    Take 1,000 mg by mouth 2 (two) times daily.    OMEPRAZOLE (PRILOSEC) 20 MG CAPSULE    TAKE ONE CAPSULE BY MOUTH TWICE DAILY   PIOGLITAZONE (ACTOS) 45 MG TABLET    TAKE ONE TABLET BY MOUTH AT BEDTIME  Modified Medications   No medications on file  Discontinued Medications   No medications on file

## 2014-12-06 NOTE — Progress Notes (Addendum)
EPIC Encounter for ICM Monitoring  Patient Name: Terry Peck is a 74 y.o. male Date: 12/06/2014 Primary Care Physican: Cathlean Cower, MD Primary Cardiologist: Nahser Electrophysiologist: Allred Dry Weight: 186 lbs  Bi-V pacing: > 99%       In the past month, have you:  1. Gained more than 2 pounds in a day or more than 5 pounds in a week? No. The patient reports he has lost a pound.  2. Had changes in your medications (with verification of current medications)? no  3. Had more shortness of breath than is usual for you? no  4. Limited your activity because of shortness of breath? no  5. Not been able to sleep because of shortness of breath? no  6. Had increased swelling in your feet or ankles? no  7. Had symptoms of dehydration (dizziness, dry mouth, increased thirst, decreased urine output) no  8. Had changes in sodium restriction? no  9. Been compliant with medication? Yes   ICM trend:   Follow-up plan: ICM clinic phone appointment: 01/06/15. The patient's impedence was below baseline from ~ 7/10-7/25. He denies any change in symptoms, diet, or fluid intake. No changes made today.  Copy of note sent to patient's primary care physician, primary cardiologist, and device following physician.  Alvis Lemmings, RN, BSN 12/06/2014 5:44 PM

## 2014-12-08 LAB — CUP PACEART REMOTE DEVICE CHECK
Battery Remaining Longevity: 59 mo
Brady Statistic AP VS Percent: 1 %
Brady Statistic AS VP Percent: 91 %
Brady Statistic AS VS Percent: 1 %
Brady Statistic RA Percent Paced: 8.9 %
HighPow Impedance: 74 Ohm
HighPow Impedance: 74 Ohm
Lead Channel Impedance Value: 430 Ohm
Lead Channel Pacing Threshold Amplitude: 0.75 V
Lead Channel Pacing Threshold Amplitude: 0.875 V
Lead Channel Pacing Threshold Amplitude: 1 V
Lead Channel Pacing Threshold Pulse Width: 0.5 ms
Lead Channel Sensing Intrinsic Amplitude: 4.3 mV
Lead Channel Setting Pacing Amplitude: 2 V
Lead Channel Setting Pacing Pulse Width: 0.5 ms
MDC IDC MSMT BATTERY REMAINING PERCENTAGE: 65 %
MDC IDC MSMT BATTERY VOLTAGE: 2.95 V
MDC IDC MSMT LEADCHNL LV IMPEDANCE VALUE: 900 Ohm
MDC IDC MSMT LEADCHNL LV PACING THRESHOLD PULSEWIDTH: 0.5 ms
MDC IDC MSMT LEADCHNL RV IMPEDANCE VALUE: 430 Ohm
MDC IDC MSMT LEADCHNL RV PACING THRESHOLD PULSEWIDTH: 0.5 ms
MDC IDC MSMT LEADCHNL RV SENSING INTR AMPL: 11.4 mV
MDC IDC SESS DTM: 20160811080023
MDC IDC SET LEADCHNL RA PACING AMPLITUDE: 2 V
MDC IDC SET LEADCHNL RV PACING AMPLITUDE: 2 V
MDC IDC SET LEADCHNL RV PACING PULSEWIDTH: 0.5 ms
MDC IDC SET LEADCHNL RV SENSING SENSITIVITY: 0.5 mV
MDC IDC STAT BRADY AP VP PERCENT: 9 %
Pulse Gen Model: 3265
Pulse Gen Serial Number: 7054987
Zone Setting Detection Interval: 270 ms
Zone Setting Detection Interval: 350 ms

## 2014-12-14 ENCOUNTER — Encounter: Payer: Self-pay | Admitting: Cardiology

## 2014-12-19 DIAGNOSIS — J8410 Pulmonary fibrosis, unspecified: Secondary | ICD-10-CM | POA: Diagnosis not present

## 2014-12-19 DIAGNOSIS — I5022 Chronic systolic (congestive) heart failure: Secondary | ICD-10-CM | POA: Diagnosis not present

## 2014-12-23 ENCOUNTER — Encounter: Payer: Self-pay | Admitting: Internal Medicine

## 2014-12-23 MED ORDER — INSULIN GLARGINE 100 UNIT/ML SOLOSTAR PEN: PEN_INJECTOR | SUBCUTANEOUS | Status: DC

## 2015-01-06 ENCOUNTER — Ambulatory Visit (INDEPENDENT_AMBULATORY_CARE_PROVIDER_SITE_OTHER): Payer: Medicare Other

## 2015-01-06 DIAGNOSIS — I5022 Chronic systolic (congestive) heart failure: Secondary | ICD-10-CM

## 2015-01-06 DIAGNOSIS — Z95810 Presence of automatic (implantable) cardiac defibrillator: Secondary | ICD-10-CM

## 2015-01-07 ENCOUNTER — Telehealth: Payer: Self-pay

## 2015-01-07 NOTE — Telephone Encounter (Signed)
ICM transmission received.  Attempted call to patient and no answer.

## 2015-01-11 NOTE — Progress Notes (Signed)
EPIC Encounter for ICM Monitoring  Patient Name: Terry Peck is a 74 y.o. male Date: 01/11/2015 Primary Care Physican: Cathlean Cower, MD Primary Cardiologist: Nahser Electrophysiologist: Allred Dry Weight: 186 lbs       In the past month, have you:  1. Gained more than 2 pounds in a day or more than 5 pounds in a week? no  2. Had changes in your medications (with verification of current medications)? no  3. Had more shortness of breath than is usual for you? no  4. Limited your activity because of shortness of breath? no  5. Not been able to sleep because of shortness of breath? no  6. Had increased swelling in your feet or ankles? no  7. Had symptoms of dehydration (dizziness, dry mouth, increased thirst, decreased urine output) no  8. Had changes in sodium restriction? no  9. Been compliant with medication? Yes   ICM trend:   Follow-up plan: ICM clinic phone appointment 03/15/2015.  He has in office appointment for defib check 02/14/2015 and is aware.  Patient reported he is feeling good.  He is working on increasing his Hgb since he was dx with Anemia in July.  He stated he has Barrett's Esophagus causing a slow leak.  Education given Anemia can exacerbate HF.  He denied any HF symptoms.  Copy of note sent to patient's primary care physician, primary cardiologist, and device following physician.  Rosalene Billings, RN, CCM 01/11/2015 3:07 PM

## 2015-01-11 NOTE — Telephone Encounter (Signed)
Spoke with patient.

## 2015-01-19 DIAGNOSIS — J8410 Pulmonary fibrosis, unspecified: Secondary | ICD-10-CM | POA: Diagnosis not present

## 2015-01-19 DIAGNOSIS — I5022 Chronic systolic (congestive) heart failure: Secondary | ICD-10-CM | POA: Diagnosis not present

## 2015-01-24 ENCOUNTER — Other Ambulatory Visit: Payer: Self-pay | Admitting: Internal Medicine

## 2015-02-14 ENCOUNTER — Encounter: Payer: Self-pay | Admitting: Internal Medicine

## 2015-02-14 ENCOUNTER — Ambulatory Visit (INDEPENDENT_AMBULATORY_CARE_PROVIDER_SITE_OTHER): Payer: Medicare Other | Admitting: Internal Medicine

## 2015-02-14 VITALS — BP 160/80 | HR 64 | Ht 68.0 in | Wt 195.2 lb

## 2015-02-14 DIAGNOSIS — I5022 Chronic systolic (congestive) heart failure: Secondary | ICD-10-CM | POA: Diagnosis not present

## 2015-02-14 DIAGNOSIS — Z95810 Presence of automatic (implantable) cardiac defibrillator: Secondary | ICD-10-CM

## 2015-02-14 DIAGNOSIS — I119 Hypertensive heart disease without heart failure: Secondary | ICD-10-CM | POA: Diagnosis not present

## 2015-02-14 LAB — CUP PACEART INCLINIC DEVICE CHECK
Brady Statistic RV Percent Paced: 99.93 %
Date Time Interrogation Session: 20161024112315
HighPow Impedance: 75 Ohm
Implantable Lead Implant Date: 20131008
Implantable Lead Implant Date: 20131008
Implantable Lead Implant Date: 20131008
Implantable Lead Location: 753858
Implantable Lead Location: 753859
Implantable Lead Location: 753860
Lead Channel Impedance Value: 1012.5 Ohm
Lead Channel Impedance Value: 525 Ohm
Lead Channel Pacing Threshold Amplitude: 0.75 V
Lead Channel Pacing Threshold Amplitude: 1 V
Lead Channel Pacing Threshold Pulse Width: 0.5 ms
Lead Channel Pacing Threshold Pulse Width: 0.5 ms
Lead Channel Sensing Intrinsic Amplitude: 11.4 mV
Lead Channel Setting Pacing Amplitude: 2 V
Lead Channel Setting Pacing Amplitude: 2 V
Lead Channel Setting Pacing Amplitude: 2 V
Lead Channel Setting Pacing Pulse Width: 0.5 ms
Lead Channel Setting Pacing Pulse Width: 0.5 ms
MDC IDC MSMT BATTERY REMAINING LONGEVITY: 57.6
MDC IDC MSMT LEADCHNL LV PACING THRESHOLD PULSEWIDTH: 0.5 ms
MDC IDC MSMT LEADCHNL RA PACING THRESHOLD AMPLITUDE: 1 V
MDC IDC MSMT LEADCHNL RA SENSING INTR AMPL: 5 mV
MDC IDC MSMT LEADCHNL RV IMPEDANCE VALUE: 512.5 Ohm
MDC IDC SET LEADCHNL RV SENSING SENSITIVITY: 0.5 mV
MDC IDC STAT BRADY RA PERCENT PACED: 9.1 %
Pulse Gen Serial Number: 7054987

## 2015-02-14 NOTE — Progress Notes (Signed)
PCP: Cathlean Cower, MD Primary Cardiologist: Nahser  Terry Peck is a 74 y.o. male who presents today for routine electrophysiology followup.  Since last being seen in our clinic, the patient reports doing very well. He has normalized his EF post CRT implant.  He is walking regularly but has not been compliant with sodium restriction.  Today, he denies symptoms of palpitations, chest pain, shortness of breath,  lower extremity edema, dizziness, presyncope, syncope, or ICD shocks.  He did have SOB in July which was due to profound anemia and has resolved.   The patient is otherwise without complaint today.   Past Medical History  Diagnosis Date  . Allergy   . GERD (gastroesophageal reflux disease)   . Hypertension   . Colon polyps 11/23/2010  . Barrett's esophagus 11/23/2010  . Abnormal ANCA test 07/03/2011  . Chronic systolic dysfunction of left ventricle     EF 30%  . Pulmonary fibrosis ([place])   . Pulmonary nodules   . Iron deficiency anemia 05/30/2011  . B12 deficiency anemia   . Bundle branch block, left   . ICD (implantable cardiac defibrillator) in place   . Shortness of breath     "related to heart being out of rhythm" (01/29/2012)  . Type II diabetes mellitus ([place])   . Arthritis     "mild; right shoulder" (01/29/2012)  . Liver abscess 1980's  . Nonischemic cardiomyopathy ([place])     s/p St. Jude BiV ICD 01/29/12  . Hyperlipidemia 10/14/2012  . Cataract     L eye surgery  . CHF (congestive heart failure) [facility])     pacemaker   Past Surgical History  Procedure Laterality Date  . Tonsillectomy  1952  . Cardiac defibrillator placement  01/29/2012    SJM Quadra Assura BiV ICD implanted by Dr Rayann Heman  . Abscess drained  ?1980's    liver  . Bi-ventricular implantable cardioverter defibrillator N/A 01/29/2012    Procedure: BI-VENTRICULAR IMPLANTABLE CARDIOVERTER DEFIBRILLATOR  (CRT-D);  Surgeon: Thompson Grayer, MD;  Location: [facility] CATH LAB;  Service: Cardiovascular;  Laterality: N/A;  .  Cataract extraction Left   . Colonoscopy    . Upper gi endoscopy      Current Outpatient Prescriptions  Medication Sig Dispense Refill  . aspirin EC 81 MG tablet Take 81 mg by mouth daily.    Marland Kitchen atorvastatin (LIPITOR) 40 MG tablet TAKE ONE TABLET BY MOUTH ONCE DAILY 90 tablet 1  . Calcium-Magnesium-Zinc 1000-400-15 MG TABS Take 1 tablet by mouth daily.     . carvedilol (COREG) 25 MG tablet Take 1 tablet (25 mg total) by mouth 2 (two) times daily with a meal. 180 tablet 3  . Cholecalciferol (VITAMIN D) 1000 UNITS capsule Take 1,000 Units by mouth daily.     . Cyanocobalamin (B-12 PO) Take 1 tablet by mouth daily.    . ferrous sulfate 325 (65 FE) MG tablet Take 1 tablet (325 mg total) by mouth 3 (three) times daily with meals. 90 tablet 0  . furosemide (LASIX) 40 MG tablet TAKE ONE TABLET BY MOUTH ONCE DAILY 30 tablet 5  . ibuprofen (ADVIL,MOTRIN) 200 MG tablet Take 200-300 mg by mouth 2 (two) times daily as needed. For pain    . Insulin Glargine (LANTUS SOLOSTAR) 100 UNIT/ML Solostar Pen 40 units sq per day 5 pen 11  . Lutein 20 MG CAPS Take 20 mg by mouth daily.    . metFORMIN (GLUCOPHAGE) 850 MG tablet TAKE ONE TABLET BY MOUTH THREE TIMES DAILY  270 tablet 2  . methocarbamol (ROBAXIN) 500 MG tablet Take 500 mg by mouth daily as needed for muscle spasms.    . Multiple Vitamin (MULTIVITAMIN WITH MINERALS) TABS Take 1 tablet by mouth daily.    . OMEGA 3 1000 MG CAPS Take 1,000 mg by mouth 2 (two) times daily.     Marland Kitchen omeprazole (PRILOSEC) 20 MG capsule TAKE ONE CAPSULE BY MOUTH TWICE DAILY 180 capsule 2  . pioglitazone (ACTOS) 45 MG tablet TAKE ONE TABLET BY MOUTH AT BEDTIME 90 tablet 2  . [DISCONTINUED] simvastatin (ZOCOR) 80 MG tablet Take 1 tablet (80 mg total) by mouth at bedtime. 90 tablet 3   No current facility-administered medications for this visit.    Physical Exam: Filed Vitals:   02/14/15 0937  BP: 160/80  Pulse: 64  Height: 5\' 8"  (1.727 m)  Weight: 195 lb 3.2 oz (88.542 kg)   SpO2: 94%    GEN- The patient is well appearing, alert and oriented x 3 today.   Head- normocephalic, atraumatic Eyes-  Sclera clear, conjunctiva pink Ears- hearing intact Oropharynx- clear Lungs- Clear to ausculation bilaterally, normal work of breathing Chest- ICD pocket is well healed Heart- Regular rate and rhythm, no murmurs, rubs or gallops, PMI not laterally displaced GI- soft, NT, ND, + BS Extremities- no clubbing, cyanosis, or edema  ICD interrogation- reviewed in detail today,  See PACEART report ekg 7/16 reveals sinus rhythm with BiV pacing  Assessment and Plan:  1.  Chronic systolic dysfunction euvolemic today Stable on an appropriate medical regimen Normal ICD function See Pace Art report No changes today Followed in the ICM device clinic  2. Hypertensive cardiovascular disease BP is elevated today He reports good control at home No changes today Sodium restriction advised  [name]  Return in 1 year to see EP NP Follow-up with Dr Acie Fredrickson as scheduled  Thompson Grayer MD, [facility] 02/14/2015 10:22 AM

## 2015-02-14 NOTE — Patient Instructions (Signed)
Medication Instructions:  Your physician recommends that you continue on your current medications as directed. Please refer to the Current Medication list given to you today.   Labwork: None ordered   Testing/Procedures: None ordered   Follow-Up: Your physician wants you to follow-up in: 12 months with Chanetta Marshall, NP You will receive a reminder letter in the mail two months in advance. If you don't receive a letter, please call our office to schedule the follow-up appointment.  Remote monitoring is used to monitor your  ICD from home. This monitoring reduces the number of office visits required to check your device to one time per year. It allows [place] to keep an eye on the functioning of your device to ensure it is working properly. You are scheduled for a device check from home on 05/16/15. You may send your transmission at any time that day. If you have a wireless device, the transmission will be sent automatically. After your physician reviews your transmission, you will receive a postcard with your next transmission date.    Any Other Special Instructions Will Be Listed Below (If Applicable).

## 2015-02-17 ENCOUNTER — Ambulatory Visit (INDEPENDENT_AMBULATORY_CARE_PROVIDER_SITE_OTHER): Payer: Medicare Other | Admitting: Cardiovascular Disease

## 2015-02-17 ENCOUNTER — Encounter: Payer: Self-pay | Admitting: Cardiovascular Disease

## 2015-02-17 VITALS — BP 130/88 | HR 65 | Ht 68.0 in | Wt 194.1 lb

## 2015-02-17 DIAGNOSIS — E785 Hyperlipidemia, unspecified: Secondary | ICD-10-CM

## 2015-02-17 DIAGNOSIS — I447 Left bundle-branch block, unspecified: Secondary | ICD-10-CM

## 2015-02-17 DIAGNOSIS — I5022 Chronic systolic (congestive) heart failure: Secondary | ICD-10-CM

## 2015-02-17 NOTE — Patient Instructions (Signed)
Medication Instructions:  Your physician recommends that you continue on your current medications as directed. Please refer to the Current Medication list given to you today.   Labwork: None Ordered   Testing/Procedures: None Ordered   Follow-Up: Your physician wants you to follow-up in: 6 months with Dr. Nahser.  You will receive a reminder letter in the mail two months in advance. If you don't receive a letter, please call our office to schedule the follow-up appointment.   If you need a refill on your cardiac medications before your next appointment, please call your pharmacy.   Thank you for choosing [facility]! Kasidy Gianino, RN [phone]    

## 2015-02-17 NOTE — Progress Notes (Signed)
Terry Peck Date of Birth  [birth year] [place] [address]    [place]   [place], [place]  [address]    [place], [place]  [address] [phone]  Fax  [phone]  [phone]  Fax [phone]  Problem list: 1. Left bundle branch block 2. Diabetes mellitus 3. Hyperlipidemia 4. Hypertension 5. Congestive heart failure-EF equals 30-35% 6. Bi-V ICD placement  History of Present Illness:  Terry Peck is a 74 yo who presents for evaluation of dyspnea.  He has been found to have a LBBB and has CHF with an EF of 30-35%.  He gets dyspneac with exercise ( mowing the lawn).  He denies any PND or orthopnea. He describes generalized fatigue with exertion.  The shortness of breath has been present for about a year. He admits to eating a fair amount of salty foods. He eats hot dogs, ham, bacon, sausage. He does not use any exercise but instead uses potassium chloride ( No salt).  He was seen by his medical doctor and was found to have a left bundle branch block. and was referred here for further evaluation. It was at that point that he had an echocardiogram which revealed global left ventricular systolic dysfunction with an EF of 30-35%.  He had a stress Myoview study which reveals an ejection fraction 29%. There is global left ventricular dilatation. There is no evidence of ischemia. His symptoms have improved dramatically since we've stopped his verapamil and start him on carvedilol.  Continue to gradually titrate up his medications. We change his HCTZ to Lasix during his last visit. We also added lisinopril. He's feeling better.  He is able to do more of his normal activities without severe dyspnea.  December 10, 2011 He continues to gradually and steadily improved. He is now able to mow his lawn and edge  without any difficulties. He's doing quite well at cardiac rehabilitation to  October 17, 2012:  Terry Peck is doing great.  He has definitely  seen a benefit from the BI-V pacer / ICD.  He's able to do all of his yard work without significant difficulty or shortness of breath.  No chest pain.  We have not done a cardiac cath - he was not found to have significant ischemia on myoview and he has made tremendous progress with medical therapy for his CHF.   Echo in April , 2014 Study Conclusions  - Left ventricle: The cavity size was normal. Wall thickness was increased in a pattern of severe LVH. Systolic function was normal. The estimated ejection fraction was 55%. Wall motion was normal; there were no regional wall motion abnormalities. - Left atrium: The atrium was mildly dilated. - Atrial septum: No defect or patent foramen ovale was identified.  His LV EF has normalized with medical therapy and the ICD.   Dec. 2, 2014:  Terry Peck is doing well.  Exercising well.  No CP, no dyspnea.    November 02, 2013:  Terry Peck is doing well. He did not take his furosemide yesterday or today and so his blood pressure is a little bit higher. He takes his blood pressure on a regular basis at home and his readings are in the normal range.  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 13, 2016 Terry Peck is a 74 yo with hx of chronic systolic CHF.  He is s/p Bi -  V  ICD placement.   His EF has normalized since placement of the Bi-V ICD.  He is feeling much better. Exercising and doing all that he can do.   He now has a Optivol ( or equivalent)  Feature enabled .  Volume status is good.   November 01, 2014:  Has some DOE recently .  Very mild  Not eating any extra salt.   Oct. 27, 2016:  Terry Peck is doing well. Has normalized his LV function with the Bi-V pacer    Current Outpatient Prescriptions on File Prior to Visit  Medication Sig Dispense Refill  . aspirin EC 81 MG tablet Take 81 mg by mouth daily.    Marland Kitchen atorvastatin (LIPITOR) 40 MG tablet TAKE ONE TABLET BY MOUTH ONCE DAILY 90 tablet 1  .  Calcium-Magnesium-Zinc 1000-400-15 MG TABS Take 1 tablet by mouth daily.     . carvedilol (COREG) 25 MG tablet Take 1 tablet (25 mg total) by mouth 2 (two) times daily with a meal. 180 tablet 3  . Cholecalciferol (VITAMIN D) 1000 UNITS capsule Take 1,000 Units by mouth daily.     . Cyanocobalamin (B-12 PO) Take 1 tablet by mouth daily.    . ferrous sulfate 325 (65 FE) MG tablet Take 1 tablet (325 mg total) by mouth 3 (three) times daily with meals. 90 tablet 0  . furosemide (LASIX) 40 MG tablet TAKE ONE TABLET BY MOUTH ONCE DAILY 30 tablet 5  . ibuprofen (ADVIL,MOTRIN) 200 MG tablet Take 200-300 mg by mouth 2 (two) times daily as needed. For pain    . Insulin Glargine (LANTUS SOLOSTAR) 100 UNIT/ML Solostar Pen 40 units sq per day 5 pen 11  . Lutein 20 MG CAPS Take 20 mg by mouth daily.    . metFORMIN (GLUCOPHAGE) 850 MG tablet TAKE ONE TABLET BY MOUTH THREE TIMES DAILY 270 tablet 2  . methocarbamol (ROBAXIN) 500 MG tablet Take 500 mg by mouth daily as needed for muscle spasms.    . Multiple Vitamin (MULTIVITAMIN WITH MINERALS) TABS Take 1 tablet by mouth daily.    . OMEGA 3 1000 MG CAPS Take 1,000 mg by mouth 2 (two) times daily.     Marland Kitchen omeprazole (PRILOSEC) 20 MG capsule TAKE ONE CAPSULE BY MOUTH TWICE DAILY 180 capsule 2  . pioglitazone (ACTOS) 45 MG tablet TAKE ONE TABLET BY MOUTH AT BEDTIME 90 tablet 2  . [DISCONTINUED] simvastatin (ZOCOR) 80 MG tablet Take 1 tablet (80 mg total) by mouth at bedtime. 90 tablet 3   No current facility-administered medications on file prior to visit.    Allergies  Allergen Reactions  . Penicillins Anaphylaxis and Other (See Comments)    "swelling of eyes; throat; could breath good" (01/29/2012)  . Iodine Other (See Comments)    "if I get iodine dye, I'll get a fever" (01/29/2012)  . Fish Oil Itching    Pt said only when given through IV    Past Medical History  Diagnosis Date  . Allergy   . GERD (gastroesophageal reflux disease)   . Hypertension   .  Colon polyps 11/23/2010  . Barrett's esophagus 11/23/2010  . Abnormal ANCA test 07/03/2011  . Chronic systolic dysfunction of left ventricle     EF 30%  . Pulmonary fibrosis ([place])   . Pulmonary nodules   . Iron deficiency anemia 05/30/2011  . B12 deficiency anemia   . Bundle branch block, left   . ICD (implantable cardiac defibrillator) in place   . Shortness  of breath     "related to heart being out of rhythm" (01/29/2012)  . Type II diabetes mellitus ([place])   . Arthritis     "mild; right shoulder" (01/29/2012)  . Liver abscess 1980's  . Nonischemic cardiomyopathy ([place])     s/p St. Jude BiV ICD 01/29/12  . Hyperlipidemia 10/14/2012  . Cataract     L eye surgery  . CHF (congestive heart failure) [facility])     pacemaker    Past Surgical History  Procedure Laterality Date  . Tonsillectomy  1952  . Cardiac defibrillator placement  01/29/2012    SJM Quadra Assura BiV ICD implanted by Dr Rayann Heman  . Abscess drained  ?1980's    liver  . Bi-ventricular implantable cardioverter defibrillator N/A 01/29/2012    Procedure: BI-VENTRICULAR IMPLANTABLE CARDIOVERTER DEFIBRILLATOR  (CRT-D);  Surgeon: Thompson Grayer, MD;  Location: [facility] CATH LAB;  Service: Cardiovascular;  Laterality: N/A;  . Cataract extraction Left   . Colonoscopy    . Upper gi endoscopy      History  Smoking status  . Former Smoker -- 0.50 packs/day for 10 years  . Types: Cigarettes  . Quit date: 04/23/1988  Smokeless tobacco  . Never Used    History  Alcohol Use No    Family History  Problem Relation Age of Onset  . Heart disease Mother   . Colon cancer Neg Hx   . Esophageal cancer Neg Hx   . Stomach cancer Neg Hx     Reviw of Systems:  Reviewed in the HPI.  All other systems are negative.  Physical Exam: Blood pressure 130/88, pulse 65, height 5\' 8"  (1.727 m), weight 194 lb 1.9 oz (88.052 kg), SpO2 93 %. General: Well developed, well nourished, in no acute distress.  Head: Normocephalic, atraumatic, sclera  non-icteric, mucus membranes are moist,   Neck: Supple. Carotids are 2 + without bruits. No JVD  Lungs: clear  Heart: regular rate.  normal  S1 S2.  2/6 systolic murmur   Abdomen: Soft, non-tender, non-distended with normal bowel sounds. No hepatomegaly. No rebound/guarding. No masses.  Msk:  Strength and tone are normal  Extremities: No clubbing or cyanosis. No edema.  Distal pedal pulses are 2+ and equal bilaterally.  Neuro: Alert and oriented X 3. Moves all extremities spontaneously.  Psych:  Responds to questions appropriately with a normal affect.  ECG: 05/05/2004 HEENT: Normal sinus rhythm at 65. He has biventricular pacing.  Assessment / Plan:   1. Left bundle branch block-  S/p Bi-V pacer ,  Doing well.  2. Diabetes mellitus 3. Hyperlipidemia - will see him in 6 months and will get lipids, liver enz. And BMP at that time .   4. Hypertension 5. Congestive heart failure-    His EF had improved from 30% to 55% by echo in 2014.       He is doing great.  Out doing yard work regularly .  6. Bi-V ICD placement -   Followed by Dr. Rayann Heman.  Doing great.     Nahser, Wonda Cheng, MD  02/17/2015 9:00 AM    [place] [place],  [place], [place]  [address] Pager [phone] Phone: [phone]; Fax: [phone]   [facility]  [address] [place], [place]  [address] [phone]    Fax [phone]

## 2015-02-18 DIAGNOSIS — J8410 Pulmonary fibrosis, unspecified: Secondary | ICD-10-CM | POA: Diagnosis not present

## 2015-02-18 DIAGNOSIS — I5022 Chronic systolic (congestive) heart failure: Secondary | ICD-10-CM | POA: Diagnosis not present

## 2015-02-22 ENCOUNTER — Other Ambulatory Visit: Payer: Self-pay | Admitting: Cardiovascular Disease

## 2015-03-02 ENCOUNTER — Encounter: Payer: Self-pay | Admitting: Pulmonary Disease

## 2015-03-02 ENCOUNTER — Ambulatory Visit (INDEPENDENT_AMBULATORY_CARE_PROVIDER_SITE_OTHER): Payer: Medicare Other | Admitting: Pulmonary Disease

## 2015-03-02 ENCOUNTER — Ambulatory Visit (INDEPENDENT_AMBULATORY_CARE_PROVIDER_SITE_OTHER)
Admission: RE | Admit: 2015-03-02 | Discharge: 2015-03-02 | Disposition: A | Discharge status: Home / Self Care | Payer: Medicare Other | Source: Ambulatory Visit | Attending: Pulmonary Disease | Admitting: Pulmonary Disease

## 2015-03-02 ENCOUNTER — Other Ambulatory Visit (INDEPENDENT_AMBULATORY_CARE_PROVIDER_SITE_OTHER): Payer: Medicare Other

## 2015-03-02 VITALS — BP 132/82 | HR 66 | Temp 97.8°F | Wt 194.6 lb

## 2015-03-02 DIAGNOSIS — R911 Solitary pulmonary nodule: Secondary | ICD-10-CM

## 2015-03-02 DIAGNOSIS — J84112 Idiopathic pulmonary fibrosis: Secondary | ICD-10-CM

## 2015-03-02 DIAGNOSIS — I110 Hypertensive heart disease with heart failure: Secondary | ICD-10-CM

## 2015-03-02 DIAGNOSIS — K219 Gastro-esophageal reflux disease without esophagitis: Secondary | ICD-10-CM

## 2015-03-02 DIAGNOSIS — K2270 Barrett's esophagus without dysplasia: Secondary | ICD-10-CM

## 2015-03-02 DIAGNOSIS — D509 Iron deficiency anemia, unspecified: Secondary | ICD-10-CM

## 2015-03-02 DIAGNOSIS — I447 Left bundle-branch block, unspecified: Secondary | ICD-10-CM

## 2015-03-02 DIAGNOSIS — Z23 Encounter for immunization: Secondary | ICD-10-CM

## 2015-03-02 DIAGNOSIS — E538 Deficiency of other specified B group vitamins: Secondary | ICD-10-CM

## 2015-03-02 DIAGNOSIS — D649 Anemia, unspecified: Secondary | ICD-10-CM

## 2015-03-02 DIAGNOSIS — Z95810 Presence of automatic (implantable) cardiac defibrillator: Secondary | ICD-10-CM

## 2015-03-02 DIAGNOSIS — I5022 Chronic systolic (congestive) heart failure: Secondary | ICD-10-CM

## 2015-03-02 DIAGNOSIS — J8489 Other specified interstitial pulmonary diseases: Secondary | ICD-10-CM | POA: Diagnosis not present

## 2015-03-02 LAB — CBC WITH DIFFERENTIAL/PLATELET
BASOS PCT: 0.7 % (ref 0.0–3.0)
Basophils Absolute: 0 10*3/uL (ref 0.0–0.1)
EOS PCT: 2.5 % (ref 0.0–5.0)
Eosinophils Absolute: 0.2 10*3/uL (ref 0.0–0.7)
HCT: 46.5 % (ref 39.0–52.0)
Hemoglobin: 15.1 g/dL (ref 13.0–17.0)
LYMPHS ABS: 1.9 10*3/uL (ref 0.7–4.0)
Lymphocytes Relative: 27.2 % (ref 12.0–46.0)
MCHC: 32.5 g/dL (ref 30.0–36.0)
MCV: 87 fl (ref 78.0–100.0)
Monocytes Absolute: 0.8 10*3/uL (ref 0.1–1.0)
Monocytes Relative: 11.8 % (ref 3.0–12.0)
NEUTROS ABS: 3.9 10*3/uL (ref 1.4–7.7)
NEUTROS PCT: 57.8 % (ref 43.0–77.0)
PLATELETS: 259 10*3/uL (ref 150.0–400.0)
RBC: 5.35 Mil/uL (ref 4.22–5.81)
RDW: 21.9 % — AB (ref 11.5–15.5)
WBC: 6.8 10*3/uL (ref 4.0–10.5)

## 2015-03-02 LAB — IBC PANEL
Iron: 261 ug/dL — ABNORMAL HIGH (ref 42–165)
Saturation Ratios: 61.9 % — ABNORMAL HIGH (ref 20.0–50.0)
Transferrin: 301 mg/dL (ref 212.0–360.0)

## 2015-03-02 LAB — FERRITIN: Ferritin: 15.5 ng/mL — ABNORMAL LOW (ref 22.0–322.0)

## 2015-03-02 LAB — VITAMIN B12: Vitamin B-12: >1500 pg/mL — ABNORMAL HIGH (ref 211–911)

## 2015-03-02 NOTE — Patient Instructions (Signed)
Today we updated your med list in our EPIC system...    Continue your current medications the same...  Today we rechecked your CXR & lab work...    We will contact you w/ the results when available...    We will arrange for an overnight oximetry test (ONO) to be done at your house one eve...    We will contact you w/ this result when avail & discuss the need for oxygen at night if necessary...  Call for any questions...  Let's plan a follow up visit in 7mo, sooner if needed for problems.Marland KitchenMarland Kitchen

## 2015-03-15 ENCOUNTER — Ambulatory Visit (INDEPENDENT_AMBULATORY_CARE_PROVIDER_SITE_OTHER): Payer: Medicare Other

## 2015-03-15 DIAGNOSIS — Z95810 Presence of automatic (implantable) cardiac defibrillator: Secondary | ICD-10-CM | POA: Diagnosis not present

## 2015-03-15 DIAGNOSIS — I5022 Chronic systolic (congestive) heart failure: Secondary | ICD-10-CM

## 2015-03-16 ENCOUNTER — Telehealth: Payer: Self-pay

## 2015-03-16 NOTE — Telephone Encounter (Signed)
Spoke with patient.

## 2015-03-16 NOTE — Telephone Encounter (Signed)
ICM transmission received.  Attempted home and cell.  Left message for return call.

## 2015-03-16 NOTE — Progress Notes (Signed)
EPIC Encounter for ICM Monitoring  Patient Name: Terry Peck is a 74 y.o. male Date: 03/16/2015 Primary Care Physican: Cathlean Cower, MD Primary Cardiologist: Nahser Electrophysiologist: Allred Dry Weight: 185 lbs  Bi-V Pacing >99%       In the past month, have you:  1. Gained more than 2 pounds in a day or more than 5 pounds in a week? no  2. Had changes in your medications (with verification of current medications)? no  3. Had more shortness of breath than is usual for you? no  4. Limited your activity because of shortness of breath? no  5. Not been able to sleep because of shortness of breath? no  6. Had increased swelling in your feet or ankles? no  7. Had symptoms of dehydration (dizziness, dry mouth, increased thirst, decreased urine output) no  8. Had changes in sodium restriction? no  9. Been compliant with medication? Yes   ICM trend:   Follow-up plan: ICM clinic phone appointment 04/20/2015.  Corvue daily impedance below baseline 02/24/2015 to 03/06/2015 and he denied any HF symptoms at that time.  Impedance above the baseline 03/11/2015 to 03/13/2015 and thinks his fluid intake has been decreased due to holiday activities.  Encouraged him to drink up to 64 oz a day as a general recommendation.   No changes today.   Copy of note sent to patient's primary care physician, primary cardiologist, and device following physician.  Rosalene Billings, RN, CCM 03/16/2015 2:18 PM

## 2015-03-21 DIAGNOSIS — J8410 Pulmonary fibrosis, unspecified: Secondary | ICD-10-CM | POA: Diagnosis not present

## 2015-03-21 DIAGNOSIS — I5022 Chronic systolic (congestive) heart failure: Secondary | ICD-10-CM | POA: Diagnosis not present

## 2015-04-20 ENCOUNTER — Ambulatory Visit (INDEPENDENT_AMBULATORY_CARE_PROVIDER_SITE_OTHER): Payer: Medicare Other

## 2015-04-20 DIAGNOSIS — I5022 Chronic systolic (congestive) heart failure: Secondary | ICD-10-CM

## 2015-04-20 DIAGNOSIS — Z95810 Presence of automatic (implantable) cardiac defibrillator: Secondary | ICD-10-CM | POA: Diagnosis not present

## 2015-04-20 DIAGNOSIS — J8410 Pulmonary fibrosis, unspecified: Secondary | ICD-10-CM | POA: Diagnosis not present

## 2015-04-22 NOTE — Progress Notes (Signed)
EPIC Encounter for ICM Monitoring  Patient Name: Terry Peck is a 74 y.o. male Date: 04/22/2015 Primary Care Physican: Cathlean Cower, MD Primary Cardiologist: Nahser Electrophysiologist: Allred Dry Weight: 184 lbs  Bi-V Pacing >99%       In the past month, have you:  1. Gained more than 2 pounds in a day or more than 5 pounds in a week? no  2. Had changes in your medications (with verification of current medications)? no  3. Had more shortness of breath than is usual for you? no  4. Limited your activity because of shortness of breath? no  5. Not been able to sleep because of shortness of breath? no  6. Had increased swelling in your feet or ankles? no  7. Had symptoms of dehydration (dizziness, dry mouth, increased thirst, decreased urine output) no  8. Had changes in sodium restriction? no  9. Been compliant with medication? Yes   ICM trend: 04/20/2015 - 3 month view   ICM Trend: 1 year view    Follow-up plan: ICM clinic phone appointment on 05/24/2015.  Corvue impedance below baseline 03/21/2015 to 03/26/2015 and 04/06/2015 suggesting fluid retention.  He denied any HF symptoms.  Impedance above baseline 04/01/2015 to 04/05/2015 and 04/09/2015 to 04/13/2015 suggesting dryness.  He reported his diet has not been consistent due to holidays.  Impedance trending along baseline 04/15/2015.   He stated he feels fine at this time.  Reminded him to resume low salt diet. No changes today.    Copy of note sent to patient's primary care physician, primary cardiologist, and device following physician.  Rosalene Billings, RN, CCM 04/22/2015 2:39 PM

## 2015-04-26 ENCOUNTER — Other Ambulatory Visit: Payer: Self-pay | Admitting: Internal Medicine

## 2015-04-28 ENCOUNTER — Other Ambulatory Visit: Payer: Self-pay | Admitting: Internal Medicine

## 2015-05-21 DIAGNOSIS — I5022 Chronic systolic (congestive) heart failure: Secondary | ICD-10-CM | POA: Diagnosis not present

## 2015-05-21 DIAGNOSIS — J8410 Pulmonary fibrosis, unspecified: Secondary | ICD-10-CM | POA: Diagnosis not present

## 2015-05-24 ENCOUNTER — Ambulatory Visit (INDEPENDENT_AMBULATORY_CARE_PROVIDER_SITE_OTHER): Payer: Medicare Other | Admitting: *Deleted

## 2015-05-24 DIAGNOSIS — I5022 Chronic systolic (congestive) heart failure: Secondary | ICD-10-CM

## 2015-05-24 DIAGNOSIS — Z95810 Presence of automatic (implantable) cardiac defibrillator: Secondary | ICD-10-CM

## 2015-05-24 NOTE — Progress Notes (Signed)
Remote ICD transmission.   

## 2015-05-25 ENCOUNTER — Telehealth: Payer: Self-pay

## 2015-05-25 NOTE — Telephone Encounter (Signed)
Remote ICM transmission received.  Attempted patient call and left message for return call.   

## 2015-05-31 ENCOUNTER — Encounter: Payer: Self-pay | Admitting: Internal Medicine

## 2015-06-03 NOTE — Telephone Encounter (Signed)
Unable to reach patient for remote monthly ICM follow up.  Next remote ICM transmission scheduled for 07/05/2015 and patient letter sent with new date and to call if experiencing any symptoms.    Corvue thoracic impedance below reference line in [place] suggesting fluid retention.     ICM trend for 05/24/2015

## 2015-06-04 ENCOUNTER — Other Ambulatory Visit: Payer: Self-pay | Admitting: Internal Medicine

## 2015-06-06 ENCOUNTER — Encounter: Payer: Self-pay | Admitting: Internal Medicine

## 2015-06-06 NOTE — Telephone Encounter (Signed)
Order Done hardcopy to stephanie

## 2015-06-06 NOTE — Telephone Encounter (Signed)
Called [facility] at [phone]  Gave verbal order to d/c home O2.   They will send someone over to the pt home to pick up the oxygen.

## 2015-06-08 ENCOUNTER — Telehealth: Payer: Self-pay | Admitting: Cardiovascular Disease

## 2015-06-08 NOTE — Telephone Encounter (Signed)
Left message on confidential voice mail of Sonia Baller with [facility] that patient's dx is chronic systolic CHF, last EF in July 2016 was 50-55% and last documented BP on 02/17/15 was 130/88.  I advised she could call back and ask for me if there are additional questions.

## 2015-06-08 NOTE — Telephone Encounter (Signed)
Does pt have CHF? If so need ef and blood pressure please.

## 2015-06-13 LAB — CUP PACEART REMOTE DEVICE CHECK
Battery Remaining Percentage: 59 %
Battery Voltage: 2.95 V
Brady Statistic AP VP Percent: 15 %
Brady Statistic AP VS Percent: 1 %
Brady Statistic AS VP Percent: 85 %
Brady Statistic RA Percent Paced: 15 %
Date Time Interrogation Session: 20170131070019
HighPow Impedance: 73 Ohm
HighPow Impedance: 73 Ohm
Implantable Lead Implant Date: 20131008
Implantable Lead Implant Date: 20131008
Implantable Lead Location: 753858
Implantable Lead Location: 753859
Implantable Lead Location: 753860
Lead Channel Impedance Value: 430 Ohm
Lead Channel Impedance Value: 950 Ohm
Lead Channel Pacing Threshold Amplitude: 0.75 V
Lead Channel Pacing Threshold Amplitude: 0.875 V
Lead Channel Pacing Threshold Amplitude: 0.875 V
Lead Channel Pacing Threshold Pulse Width: 0.5 ms
Lead Channel Pacing Threshold Pulse Width: 0.5 ms
Lead Channel Sensing Intrinsic Amplitude: 11.4 mV
Lead Channel Sensing Intrinsic Amplitude: 4.2 mV
Lead Channel Setting Pacing Amplitude: 2 V
Lead Channel Setting Sensing Sensitivity: 0.5 mV
MDC IDC LEAD IMPLANT DT: 20131008
MDC IDC MSMT BATTERY REMAINING LONGEVITY: 54 mo
MDC IDC MSMT LEADCHNL RA IMPEDANCE VALUE: 440 Ohm
MDC IDC MSMT LEADCHNL RA PACING THRESHOLD PULSEWIDTH: 0.5 ms
MDC IDC SET LEADCHNL LV PACING AMPLITUDE: 2 V
MDC IDC SET LEADCHNL LV PACING PULSEWIDTH: 0.5 ms
MDC IDC SET LEADCHNL RA PACING AMPLITUDE: 1.875
MDC IDC SET LEADCHNL RV PACING PULSEWIDTH: 0.5 ms
MDC IDC STAT BRADY AS VS PERCENT: 1 %
Pulse Gen Serial Number: 7054987

## 2015-06-17 ENCOUNTER — Encounter: Payer: Self-pay | Admitting: Cardiology

## 2015-07-01 DIAGNOSIS — H25012 Cortical age-related cataract, left eye: Secondary | ICD-10-CM | POA: Diagnosis not present

## 2015-07-01 DIAGNOSIS — H2512 Age-related nuclear cataract, left eye: Secondary | ICD-10-CM | POA: Diagnosis not present

## 2015-07-01 DIAGNOSIS — E113293 Type 2 diabetes mellitus with mild nonproliferative diabetic retinopathy without macular edema, bilateral: Secondary | ICD-10-CM | POA: Diagnosis not present

## 2015-07-05 ENCOUNTER — Ambulatory Visit (INDEPENDENT_AMBULATORY_CARE_PROVIDER_SITE_OTHER): Payer: Medicare Other

## 2015-07-05 DIAGNOSIS — Z95810 Presence of automatic (implantable) cardiac defibrillator: Secondary | ICD-10-CM

## 2015-07-05 DIAGNOSIS — I5022 Chronic systolic (congestive) heart failure: Secondary | ICD-10-CM | POA: Diagnosis not present

## 2015-07-05 NOTE — Progress Notes (Signed)
EPIC Encounter for ICM Monitoring  Patient Name: Terry Peck is a 75 y.o. male Date: 07/05/2015 Primary Care Physican: Cathlean Cower, MD Primary Cardiologist: Nahser Electrophysiologist: Allred Dry Weight: 181 lbs   Bi-V Pacing >99%      In the past month, have you:  1. Gained more than 2 pounds in a day or more than 5 pounds in a week? no  2. Had changes in your medications (with verification of current medications)? no  3. Had more shortness of breath than is usual for you? no  4. Limited your activity because of shortness of breath? no  5. Not been able to sleep because of shortness of breath? no  6. Had increased swelling in your feet or ankles? no  7. Had symptoms of dehydration (dizziness, dry mouth, increased thirst, decreased urine output) no  8. Had changes in sodium restriction? no  9. Been compliant with medication? Yes   ICM trend: 3 month view for 07/05/2015    ICM trend: 1 year view for 07/05/2015   Follow-up plan: ICM clinic phone appointment 08/09/2015.  Thoracic impedance below reference line from 06/05/2015 to 06/21/2015 suggesting fluid accumulation.  He denied any symptoms.  Thoracic impedance above reference line starting 06/26/2015 and he denied any symptoms of dehydration.  Advised he can have 64 oz of fluid daily and needs close to that to stay hydrated.  He stated he will monitor his fluid intake to make sure he is getting enough.  He reported he has been feeling really well.  He will be making an appointment with Dr Acie Fredrickson for April.  Encouraged to call for any fluid symptoms.  No changes today.    Copy of note sent to patient's primary care physician, primary cardiologist, and device following physician.  Rosalene Billings, RN, CCM 07/05/2015 3:19 PM

## 2015-07-19 DIAGNOSIS — H25812 Combined forms of age-related cataract, left eye: Secondary | ICD-10-CM | POA: Diagnosis not present

## 2015-07-19 DIAGNOSIS — H25012 Cortical age-related cataract, left eye: Secondary | ICD-10-CM | POA: Diagnosis not present

## 2015-07-19 DIAGNOSIS — H2512 Age-related nuclear cataract, left eye: Secondary | ICD-10-CM | POA: Diagnosis not present

## 2015-07-27 ENCOUNTER — Other Ambulatory Visit: Payer: Self-pay | Admitting: Internal Medicine

## 2015-08-09 ENCOUNTER — Telehealth: Payer: Self-pay

## 2015-08-09 ENCOUNTER — Ambulatory Visit (INDEPENDENT_AMBULATORY_CARE_PROVIDER_SITE_OTHER): Payer: Medicare Other

## 2015-08-09 DIAGNOSIS — I5022 Chronic systolic (congestive) heart failure: Secondary | ICD-10-CM | POA: Diagnosis not present

## 2015-08-09 DIAGNOSIS — Z95810 Presence of automatic (implantable) cardiac defibrillator: Secondary | ICD-10-CM | POA: Diagnosis not present

## 2015-08-09 NOTE — Telephone Encounter (Signed)
Remote ICM transmission received.  Attempted patient call and left message for return call.   

## 2015-08-09 NOTE — Progress Notes (Signed)
EPIC Encounter for ICM Monitoring  Patient Name: Terry Peck is a 75 y.o. male Date: 08/09/2015 Primary Care Physican: Cathlean Cower, MD Primary Cardiologist: Nahser Electrophysiologist: Allred Dry Weight: unknown   Bi-V Pacing >99%      In the past month, have you:  1. Gained more than 2 pounds in a day or more than 5 pounds in a week? N/A  2. Had changes in your medications (with verification of current medications)? N/A  3. Had more shortness of breath than is usual for you? N/A  4. Limited your activity because of shortness of breath? N/A  5. Not been able to sleep because of shortness of breath? N/A  6. Had increased swelling in your feet or ankles? N/A  7. Had symptoms of dehydration (dizziness, dry mouth, increased thirst, decreased urine output) N/A  8. Had changes in sodium restriction? N/A  9. Been compliant with medication? N/A   ICM trend: 3 month view for 08/09/2015   ICM trend: 1 year view for 08/09/2015   Follow-up plan: ICM clinic phone appointment on  09/09/2015.  Attempted call to patient and unable to reach.  Transmission reviewed.  Thoracic impedance below reference line from 07/19/2015 to 07/25/2015 and 07/27/2015 to 08/01/2015 suggesting fluid accumulation and returned to reference line 08/02/2015 suggesting stable fluid levels.      Rosalene Billings, RN, CCM 08/09/2015 2:47 PM

## 2015-08-16 ENCOUNTER — Encounter: Payer: Self-pay | Admitting: Cardiovascular Disease

## 2015-08-16 ENCOUNTER — Ambulatory Visit (INDEPENDENT_AMBULATORY_CARE_PROVIDER_SITE_OTHER): Payer: Medicare Other | Admitting: Cardiovascular Disease

## 2015-08-16 VITALS — BP 125/82 | HR 60 | Ht 68.0 in | Wt 189.4 lb

## 2015-08-16 DIAGNOSIS — I10 Essential (primary) hypertension: Secondary | ICD-10-CM | POA: Diagnosis not present

## 2015-08-16 DIAGNOSIS — I5022 Chronic systolic (congestive) heart failure: Secondary | ICD-10-CM

## 2015-08-16 DIAGNOSIS — I447 Left bundle-branch block, unspecified: Secondary | ICD-10-CM | POA: Diagnosis not present

## 2015-08-16 DIAGNOSIS — E785 Hyperlipidemia, unspecified: Secondary | ICD-10-CM

## 2015-08-16 NOTE — Patient Instructions (Signed)

## 2015-08-16 NOTE — Progress Notes (Signed)
Terry Peck Date of Birth  1940/08/22       [address]    [place], [place]  [address]     [phone]  Fax  [phone]    Problem list: 1. Left bundle branch block 2. Diabetes mellitus 3. Hyperlipidemia 4. Hypertension 5. Congestive heart failure-EF equals 30-35% - has now normalized with Bi-V pacer  6. Bi-V ICD placement  History of Present Illness:  Terry Peck is a 75 yo who presents for evaluation of dyspnea.  He has been found to have a LBBB and has CHF with an EF of 30-35%.  He gets dyspneac with exercise ( mowing the lawn).  He denies any PND or orthopnea. He describes generalized fatigue with exertion.  The shortness of breath has been present for about a year. He admits to eating a fair amount of salty foods. He eats hot dogs, ham, bacon, sausage. He does not use any exercise but instead uses potassium chloride ( No salt).  He was seen by his medical doctor and was found to have a left bundle branch block. and was referred here for further evaluation. It was at that point that he had an echocardiogram which revealed global left ventricular systolic dysfunction with an EF of 30-35%.  He had a stress Myoview study which reveals an ejection fraction 29%. There is global left ventricular dilatation. There is no evidence of ischemia. His symptoms have improved dramatically since we've stopped his verapamil and start him on carvedilol.  Continue to gradually titrate up his medications. We change his HCTZ to Lasix during his last visit. We also added lisinopril. He's feeling better.  He is able to do more of his normal activities without severe dyspnea.  December 10, 2011 He continues to gradually and steadily improved. He is now able to mow his lawn and edge  without any difficulties. He's doing quite well at cardiac rehabilitation to  October 17, 2012:  Terry Peck is doing great.  He has definitely seen a benefit from the BI-V pacer / ICD.  He's able to do all of  his yard work without significant difficulty or shortness of breath.  No chest pain.  We have not done a cardiac cath - he was not found to have significant ischemia on myoview and he has made tremendous progress with medical therapy for his CHF.   Echo in April , 2014 Study Conclusions  - Left ventricle: The cavity size was normal. Wall thickness was increased in a pattern of severe LVH. Systolic function was normal. The estimated ejection fraction was 55%. Wall motion was normal; there were no regional wall motion abnormalities. - Left atrium: The atrium was mildly dilated. - Atrial septum: No defect or patent foramen ovale was identified.  His LV EF has normalized with medical therapy and the ICD.   Dec. 2, 2014:  Terry Peck is doing well.  Exercising well.  No CP, no dyspnea.    November 02, 2013:  Terry Peck is doing well. He did not take his furosemide yesterday or today and so his blood pressure is a little bit higher. He takes his blood pressure on a regular basis at home and his readings are in the normal range.  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 13, 2016 Terry Peck is a 75 yo with hx of chronic systolic CHF.  He is s/p Bi - V  ICD placement.   His EF has normalized since placement of the Bi-V ICD.  He is feeling much better. Exercising and doing all that he can do.   He now has a Optivol ( or equivalent)  Feature enabled .  Volume status is good.   November 01, 2014:  Has some DOE recently .  Very mild  Not eating any extra salt.   Oct. 27, 2016:  Terry Peck is doing well. Has normalized his LV function with the Bi-V pacer   August 16, 2015:  Terry Peck is seen back for follow up of his chf BP is elevated. Getting more normal readings at home  EF has normalized with his BI-V pacer     Current Outpatient Prescriptions on File Prior to Visit  Medication Sig Dispense Refill  . aspirin EC 81 MG tablet Take 81 mg by mouth daily.    Marland Kitchen  atorvastatin (LIPITOR) 40 MG tablet TAKE ONE TABLET BY MOUTH ONCE DAILY 90 tablet 0  . BD PEN NEEDLE NANO U/F 32G X 4 MM MISC USE ONE PEN NEEDLE TO INJECT INSULIN SUBCUTANEOUSLY EVERY DAY 100 each 1  . Calcium-Magnesium-Zinc 1000-400-15 MG TABS Take 1 tablet by mouth daily.     . carvedilol (COREG) 25 MG tablet Take 1 tablet (25 mg total) by mouth 2 (two) times daily with a meal. 180 tablet 3  . Cholecalciferol (VITAMIN D) 1000 UNITS capsule Take 1,000 Units by mouth daily.     . Cyanocobalamin (B-12 PO) Take 1 tablet by mouth daily.    . ferrous sulfate 325 (65 FE) MG tablet Take 1 tablet (325 mg total) by mouth 3 (three) times daily with meals. 90 tablet 0  . furosemide (LASIX) 40 MG tablet TAKE ONE TABLET BY MOUTH ONCE DAILY 30 tablet 11  . ibuprofen (ADVIL,MOTRIN) 200 MG tablet Take 200-300 mg by mouth 2 (two) times daily as needed. For pain    . Insulin Glargine (LANTUS SOLOSTAR) 100 UNIT/ML Solostar Pen 40 units sq per day 5 pen 11  . Lutein 20 MG CAPS Take 20 mg by mouth daily.    . metFORMIN (GLUCOPHAGE) 850 MG tablet TAKE ONE TABLET BY MOUTH THREE TIMES DAILY 270 tablet 1  . methocarbamol (ROBAXIN) 500 MG tablet Take 500 mg by mouth daily as needed for muscle spasms.    . Multiple Vitamin (MULTIVITAMIN WITH MINERALS) TABS Take 1 tablet by mouth daily.    . OMEGA 3 1000 MG CAPS Take 1,000 mg by mouth 2 (two) times daily.     Marland Kitchen omeprazole (PRILOSEC) 20 MG capsule TAKE ONE CAPSULE BY MOUTH TWICE DAILY 180 capsule 1  . pioglitazone (ACTOS) 45 MG tablet TAKE ONE TABLET BY MOUTH AT BEDTIME 90 tablet 1  . [DISCONTINUED] simvastatin (ZOCOR) 80 MG tablet Take 1 tablet (80 mg total) by mouth at bedtime. 90 tablet 3   No current facility-administered medications on file prior to visit.    Allergies  Allergen Reactions  . Penicillins Anaphylaxis and Other (See Comments)    "swelling of eyes; throat; could breath good" (01/29/2012)  . Iodine Other (See Comments)    "if I get iodine dye, I'll  get a fever" (01/29/2012)  . Fish Oil Itching    Pt said only when given through IV    Past Medical History  Diagnosis Date  . Allergy   . GERD (gastroesophageal reflux disease)   . Hypertension   . Colon polyps 11/23/2010  . Barrett's esophagus 11/23/2010  . Abnormal ANCA test  07/03/2011  . Chronic systolic dysfunction of left ventricle     EF 30%  . Pulmonary fibrosis ([place])   . Pulmonary nodules   . Iron deficiency anemia 05/30/2011  . B12 deficiency anemia   . Bundle branch block, left   . ICD (implantable cardiac defibrillator) in place   . Shortness of breath     "related to heart being out of rhythm" (01/29/2012)  . Type II diabetes mellitus ([place])   . Arthritis     "mild; right shoulder" (01/29/2012)  . Liver abscess 1980's  . Nonischemic cardiomyopathy ([place])     s/p St. Jude BiV ICD 01/29/12  . Hyperlipidemia 10/14/2012  . Cataract     L eye surgery  . CHF (congestive heart failure) [facility])     pacemaker    Past Surgical History  Procedure Laterality Date  . Tonsillectomy  1952  . Cardiac defibrillator placement  01/29/2012    SJM Quadra Assura BiV ICD implanted by Dr Rayann Heman  . Abscess drained  ?1980's    liver  . Bi-ventricular implantable cardioverter defibrillator N/A 01/29/2012    Procedure: BI-VENTRICULAR IMPLANTABLE CARDIOVERTER DEFIBRILLATOR  (CRT-D);  Surgeon: Thompson Grayer, MD;  Location: [facility] CATH LAB;  Service: Cardiovascular;  Laterality: N/A;  . Cataract extraction Left   . Colonoscopy    . Upper gi endoscopy      History  Smoking status  . Former Smoker -- 0.50 packs/day for 10 years  . Types: Cigarettes  . Quit date: 04/23/1988  Smokeless tobacco  . Never Used    History  Alcohol Use No    Family History  Problem Relation Age of Onset  . Heart disease Mother   . Colon cancer Neg Hx   . Esophageal cancer Neg Hx   . Stomach cancer Neg Hx     Reviw of Systems:  Reviewed in the HPI.  All other systems are negative.  Physical Exam: Blood  pressure 125/82, pulse 60, height 5\' 8"  (1.727 m), weight 189 lb 6.4 oz (85.911 kg). General: Well developed, well nourished, in no acute distress.  Head: Normocephalic, atraumatic, sclera non-icteric, mucus membranes are moist,   Neck: Supple. Carotids are 2 + without bruits. No JVD  Lungs: clear  Heart: regular rate.  normal  S1 S2.  2/6 systolic murmur   Abdomen: Soft, non-tender, non-distended with normal bowel sounds. No hepatomegaly. No rebound/guarding. No masses.  Msk:  Strength and tone are normal  Extremities: No clubbing or cyanosis. No edema.  Distal pedal pulses are 2+ and equal bilaterally.  Neuro: Alert and oriented X 3. Moves all extremities spontaneously.  Psych:  Responds to questions appropriately with a normal affect.  ECG: August 16, 2015:   AV paced, Bi-V pacer  At 60   Assessment / Plan:   1. Left bundle branch block-  S/p Bi-V pacer ,  Doing well.  2. Diabetes mellitus 3. Hyperlipidemia - will see him in 6 months and will get lipids, liver enz. And BMP at that time .   4. Hypertension 5. Congestive heart failure-    His EF had improved from 30% to 55% by echo in 2014.       He is doing great.  Out doing yard work regularly .  6. Bi-V ICD placement -   Followed by Dr. Rayann Heman.  Doing great.     Nahser, Wonda Cheng, MD  08/16/2015 9:57 AM    [address],  [address]  [place], [place]  [address] Pager [phone] Phone: [phone]; Fax: [phone]   [facility]  [address] [place], [place]  [address] [phone]    Fax [phone]

## 2015-08-23 ENCOUNTER — Ambulatory Visit (INDEPENDENT_AMBULATORY_CARE_PROVIDER_SITE_OTHER): Payer: Medicare Other | Admitting: *Deleted

## 2015-08-23 DIAGNOSIS — Z95810 Presence of automatic (implantable) cardiac defibrillator: Secondary | ICD-10-CM | POA: Diagnosis not present

## 2015-08-23 DIAGNOSIS — I5022 Chronic systolic (congestive) heart failure: Secondary | ICD-10-CM

## 2015-08-23 NOTE — Progress Notes (Signed)
Remote ICD transmission.   

## 2015-08-31 ENCOUNTER — Ambulatory Visit: Payer: Medicare Other | Admitting: Pulmonary Disease

## 2015-09-07 ENCOUNTER — Other Ambulatory Visit: Payer: Self-pay | Admitting: Internal Medicine

## 2015-09-07 NOTE — Telephone Encounter (Signed)
Please advise 

## 2015-09-09 ENCOUNTER — Ambulatory Visit (INDEPENDENT_AMBULATORY_CARE_PROVIDER_SITE_OTHER): Payer: Medicare Other

## 2015-09-09 DIAGNOSIS — I5022 Chronic systolic (congestive) heart failure: Secondary | ICD-10-CM

## 2015-09-09 DIAGNOSIS — Z95810 Presence of automatic (implantable) cardiac defibrillator: Secondary | ICD-10-CM

## 2015-09-12 NOTE — Progress Notes (Signed)
EPIC Encounter for ICM Monitoring  Patient Name: Terry Peck is a 75 y.o. male Date: 09/12/2015 Primary Care Physican: Cathlean Cower, MD Primary Cardiologist: Nahser Electrophysiologist: Allred Dry Weight: 181 lbs   Bi-V Pacing >99%      In the past month, have you:  1. Gained more than 2 pounds in a day or more than 5 pounds in a week? no  2. Had changes in your medications (with verification of current medications)? no  3. Had more shortness of breath than is usual for you? no  4. Limited your activity because of shortness of breath? no  5. Not been able to sleep because of shortness of breath? no  6. Had increased swelling in your feet, ankles, legs or stomach area? no  7. Had symptoms of dehydration (dizziness, dry mouth, increased thirst, decreased urine output) no  8. Had changes in sodium restriction? no  9. Been compliant with medication? Yes  ICM trend: 3 month view for 09/09/2015   ICM trend: 1 year view for 09/09/2015   Follow-up plan: ICM clinic phone appointment 10/13/2015.    FLUID LEVELS:  Corvue thoracic impedance decreased 08/30/2015 to 09/03/2015 suggesting fluid accumulation and returned to baseline 09/03/2015.  Thoracic impedance is above reference line 09/04/2015 to 09/09/2015 suggesting dryness.    SYMPTOMS:  None.  Denied any symptoms such as weight gain of 3 pounds overnight or 5 pounds within a week, SOB and/or lower extremity swelling.   No dehydration symptoms.  He stated he is feeling great.  Encouraged to call for any fluid symptoms.   EDUCATION:  Encouraged to drink up to 64 oz daily since the transmission is showing a little dryness.     RECOMMENDATIONS: No changes today.    Rosalene Billings, RN, CCM 09/12/2015 11:40 AM

## 2015-09-30 ENCOUNTER — Encounter: Payer: Self-pay | Admitting: Cardiology

## 2015-10-04 LAB — CUP PACEART REMOTE DEVICE CHECK
Battery Remaining Longevity: 52 mo
Battery Remaining Percentage: 56 %
Brady Statistic AP VP Percent: 16 %
Brady Statistic AS VS Percent: 1 %
Date Time Interrogation Session: 20170502060017
HIGH POWER IMPEDANCE MEASURED VALUE: 73 Ohm
HighPow Impedance: 73 Ohm
Implantable Lead Implant Date: 20131008
Implantable Lead Location: 753858
Implantable Lead Location: 753859
Lead Channel Impedance Value: 440 Ohm
Lead Channel Impedance Value: 950 Ohm
Lead Channel Pacing Threshold Amplitude: 0.75 V
Lead Channel Pacing Threshold Pulse Width: 0.5 ms
Lead Channel Pacing Threshold Pulse Width: 0.5 ms
Lead Channel Sensing Intrinsic Amplitude: 11.4 mV
Lead Channel Sensing Intrinsic Amplitude: 4.1 mV
Lead Channel Setting Pacing Amplitude: 2 V
Lead Channel Setting Pacing Pulse Width: 0.5 ms
Lead Channel Setting Sensing Sensitivity: 0.5 mV
MDC IDC LEAD IMPLANT DT: 20131008
MDC IDC LEAD IMPLANT DT: 20131008
MDC IDC LEAD LOCATION: 753860
MDC IDC MSMT BATTERY VOLTAGE: 2.95 V
MDC IDC MSMT LEADCHNL RA IMPEDANCE VALUE: 430 Ohm
MDC IDC MSMT LEADCHNL RA PACING THRESHOLD AMPLITUDE: 0.875 V
MDC IDC MSMT LEADCHNL RV PACING THRESHOLD AMPLITUDE: 0.875 V
MDC IDC MSMT LEADCHNL RV PACING THRESHOLD PULSEWIDTH: 0.5 ms
MDC IDC SET LEADCHNL LV PACING AMPLITUDE: 2 V
MDC IDC SET LEADCHNL LV PACING PULSEWIDTH: 0.5 ms
MDC IDC SET LEADCHNL RA PACING AMPLITUDE: 1.875
MDC IDC STAT BRADY AP VS PERCENT: 1 %
MDC IDC STAT BRADY AS VP PERCENT: 84 %
MDC IDC STAT BRADY RA PERCENT PACED: 16 %
Pulse Gen Serial Number: 7054987

## 2015-10-13 ENCOUNTER — Ambulatory Visit (INDEPENDENT_AMBULATORY_CARE_PROVIDER_SITE_OTHER): Payer: Medicare Other

## 2015-10-13 DIAGNOSIS — Z95810 Presence of automatic (implantable) cardiac defibrillator: Secondary | ICD-10-CM | POA: Diagnosis not present

## 2015-10-13 DIAGNOSIS — I5022 Chronic systolic (congestive) heart failure: Secondary | ICD-10-CM | POA: Diagnosis not present

## 2015-10-13 NOTE — Progress Notes (Addendum)
EPIC Encounter for ICM Monitoring  Patient Name: Terry Peck is a 75 y.o. male Date: 10/13/2015 Primary Care Physican: Cathlean Cower, MD Primary Cardiologist: Nahser Electrophysiologist: Allred Dry Weight: 181 lbs   Bi-V Pacing >99%      In the past month, have you:  1. Gained more than 2 pounds in a day or more than 5 pounds in a week? No  2. Had changes in your medications (with verification of current medications)? No  3. Had more shortness of breath than is usual for you? No   4. Limited your activity because of shortness of breath? No   5. Not been able to sleep because of shortness of breath? No   6. Had increased swelling in your feet, ankles, legs or stomach area? No   7. Had symptoms of dehydration (dizziness, dry mouth, increased thirst, decreased urine output) No   8. Had changes in sodium restriction? No   9. Been compliant with medication? Yes   ICM trend: 3 month view for 10/13/2015   ICM trend: 1 year view for 10/13/2015   Follow-up plan: ICM clinic phone appointment 11/22/2015.    FLUID LEVELS: Corvue thoracic impedance decreased 10/05/2015 to 10/07/2015 suggesting fluid accumulation and returned to baseline 10/07/2015.    SYMPTOMS:  None, denied any fluid symptoms such as weight gain of 3 pounds overnight or 5 pounds within a week, SOB and/or lower extremity swelling. Encouraged to call for any fluid symptoms.   EDUCATION: Limit sodium intake to < 2000 mg and fluid intake to 64 oz daily.     RECOMMENDATIONS: No changes today.     Rosalene Billings, RN, CCM 10/13/2015 3:31 PM

## 2015-10-18 ENCOUNTER — Encounter: Payer: Self-pay | Admitting: Pulmonary Disease

## 2015-10-18 ENCOUNTER — Ambulatory Visit (INDEPENDENT_AMBULATORY_CARE_PROVIDER_SITE_OTHER): Payer: Medicare Other | Admitting: Pulmonary Disease

## 2015-10-18 VITALS — BP 128/76 | HR 65 | Temp 98.4°F | Ht 68.0 in | Wt 190.4 lb

## 2015-10-18 DIAGNOSIS — Z95810 Presence of automatic (implantable) cardiac defibrillator: Secondary | ICD-10-CM

## 2015-10-18 DIAGNOSIS — I447 Left bundle-branch block, unspecified: Secondary | ICD-10-CM

## 2015-10-18 DIAGNOSIS — J84112 Idiopathic pulmonary fibrosis: Secondary | ICD-10-CM | POA: Diagnosis not present

## 2015-10-18 DIAGNOSIS — I5022 Chronic systolic (congestive) heart failure: Secondary | ICD-10-CM

## 2015-10-18 DIAGNOSIS — R911 Solitary pulmonary nodule: Secondary | ICD-10-CM

## 2015-10-18 DIAGNOSIS — I110 Hypertensive heart disease with heart failure: Secondary | ICD-10-CM

## 2015-10-18 NOTE — Patient Instructions (Signed)
Today we updated your med list in our EPIC system...    Continue your current medications the same...  We discussed decreasing the IRON to one tab daily, and similarly the B12 supplement to one tab daily...  Today we checked an ambulatory oximetry test... We will sched an at home Overnight Oximetry test to be sure your oxygen levels aren't dropping while you sleep...    We will contact you w/ the results when available...   Keep up the good work w/ your diet & exercise program...  Call for any questions...  Let's plan a follow up visit in 4-84mo, sooner if needed for any breathing problems.Marland KitchenMarland Kitchen

## 2015-10-24 ENCOUNTER — Other Ambulatory Visit: Payer: Self-pay | Admitting: Internal Medicine

## 2015-10-24 ENCOUNTER — Other Ambulatory Visit: Payer: Self-pay | Admitting: Cardiovascular Disease

## 2015-10-27 ENCOUNTER — Other Ambulatory Visit: Payer: Self-pay | Admitting: Internal Medicine

## 2015-10-31 ENCOUNTER — Encounter: Payer: Self-pay | Admitting: Pulmonary Disease

## 2015-10-31 NOTE — Telephone Encounter (Signed)
Attempted to contact [place]. Line was busy x2. Will try back.

## 2015-11-02 ENCOUNTER — Other Ambulatory Visit: Payer: Self-pay | Admitting: Internal Medicine

## 2015-11-03 ENCOUNTER — Telehealth: Payer: Self-pay | Admitting: Pulmonary Disease

## 2015-11-03 DIAGNOSIS — J8410 Pulmonary fibrosis, unspecified: Secondary | ICD-10-CM | POA: Diagnosis not present

## 2015-11-03 DIAGNOSIS — J84112 Idiopathic pulmonary fibrosis: Secondary | ICD-10-CM

## 2015-11-03 NOTE — Telephone Encounter (Signed)
Order has been placed for pt to have nocturnal oxygen through [facility].

## 2015-11-03 NOTE — Telephone Encounter (Signed)
Terry Peck, [phone] with [facility] called asking for order for nighttime O2 as the patient qualified, pulse ox report has been faxed but need the order.

## 2015-11-03 NOTE — Telephone Encounter (Signed)
Mandy @ [facility] needs an order for nocturnal O2 based on ONO results.   SN - please advise as to order. ONO results placed on cart. Thanks!

## 2015-11-04 ENCOUNTER — Encounter: Payer: Self-pay | Admitting: Pulmonary Disease

## 2015-11-04 NOTE — Telephone Encounter (Signed)
Per Dr. Lenna Gilford: Overnight Oximetry test shows patient dropped below 88% for 150 minutes.  Patient requires 2L oxygen at night. Order was placed 11/03/15, patient was not aware that he needed oxygen. [facility] called patient to deliver the oxygen to his home, patient refused the delivery as he was unaware he needed it. Patient is now aware of his results and states that it would be okay for [facility] to drop off the oxygen on Monday. Called [facility] and advised them of the situation and that patient agreed to reschedule drop off of oxygen for Monday. Gilda at [facility] stated that she will have the oxygen delivered Monday. Nothing further needed.

## 2015-11-07 DIAGNOSIS — J8410 Pulmonary fibrosis, unspecified: Secondary | ICD-10-CM | POA: Diagnosis not present

## 2015-11-07 DIAGNOSIS — I5022 Chronic systolic (congestive) heart failure: Secondary | ICD-10-CM | POA: Diagnosis not present

## 2015-11-09 ENCOUNTER — Other Ambulatory Visit (INDEPENDENT_AMBULATORY_CARE_PROVIDER_SITE_OTHER): Payer: Medicare Other

## 2015-11-09 ENCOUNTER — Ambulatory Visit (INDEPENDENT_AMBULATORY_CARE_PROVIDER_SITE_OTHER): Payer: Medicare Other | Admitting: Internal Medicine

## 2015-11-09 ENCOUNTER — Encounter: Payer: Self-pay | Admitting: Internal Medicine

## 2015-11-09 ENCOUNTER — Other Ambulatory Visit: Payer: Self-pay | Admitting: Internal Medicine

## 2015-11-09 VITALS — BP 132/80 | HR 62 | Temp 97.8°F | Ht 68.0 in | Wt 187.0 lb

## 2015-11-09 DIAGNOSIS — D509 Iron deficiency anemia, unspecified: Secondary | ICD-10-CM | POA: Diagnosis not present

## 2015-11-09 DIAGNOSIS — D649 Anemia, unspecified: Secondary | ICD-10-CM

## 2015-11-09 DIAGNOSIS — I110 Hypertensive heart disease with heart failure: Secondary | ICD-10-CM

## 2015-11-09 DIAGNOSIS — Z0001 Encounter for general adult medical examination with abnormal findings: Secondary | ICD-10-CM | POA: Diagnosis not present

## 2015-11-09 DIAGNOSIS — E785 Hyperlipidemia, unspecified: Secondary | ICD-10-CM | POA: Diagnosis not present

## 2015-11-09 DIAGNOSIS — E538 Deficiency of other specified B group vitamins: Secondary | ICD-10-CM

## 2015-11-09 DIAGNOSIS — E119 Type 2 diabetes mellitus without complications: Secondary | ICD-10-CM

## 2015-11-09 DIAGNOSIS — R6889 Other general symptoms and signs: Secondary | ICD-10-CM

## 2015-11-09 LAB — URINALYSIS, ROUTINE W REFLEX MICROSCOPIC
Bilirubin Urine: NEGATIVE
HGB URINE DIPSTICK: NEGATIVE
KETONES UR: NEGATIVE
LEUKOCYTES UA: NEGATIVE
Nitrite: NEGATIVE
RBC / HPF: NONE SEEN (ref 0–?)
Specific Gravity, Urine: 1.01 (ref 1.000–1.030)
Total Protein, Urine: NEGATIVE
UROBILINOGEN UA: 0.2 (ref 0.0–1.0)
Urine Glucose: NEGATIVE
WBC UA: NONE SEEN (ref 0–?)
pH: 7 (ref 5.0–8.0)

## 2015-11-09 LAB — BASIC METABOLIC PANEL
BUN: 18 mg/dL (ref 6–23)
CALCIUM: 9.2 mg/dL (ref 8.4–10.5)
CO2: 24 meq/L (ref 19–32)
CREATININE: 0.82 mg/dL (ref 0.40–1.50)
Chloride: 102 mEq/L (ref 96–112)
GFR: 97.26 mL/min (ref >60.00)
GLUCOSE: 133 mg/dL — AB (ref 70–99)
Potassium: 4.1 mEq/L (ref 3.5–5.1)
Sodium: 138 mEq/L (ref 135–145)

## 2015-11-09 LAB — CBC WITH DIFFERENTIAL/PLATELET
BASOS ABS: 0.1 10*3/uL (ref 0.0–0.1)
Basophils Relative: 0.9 % (ref 0.0–3.0)
EOS ABS: 0.2 10*3/uL (ref 0.0–0.7)
Eosinophils Relative: 2.5 % (ref 0.0–5.0)
HCT: 45.8 % (ref 39.0–52.0)
Hemoglobin: 15.2 g/dL (ref 13.0–17.0)
LYMPHS ABS: 1.9 10*3/uL (ref 0.7–4.0)
LYMPHS PCT: 28.7 % (ref 12.0–46.0)
MCHC: 33.3 g/dL (ref 30.0–36.0)
MCV: 93.3 fl (ref 78.0–100.0)
MONOS PCT: 9.3 % (ref 3.0–12.0)
Monocytes Absolute: 0.6 10*3/uL (ref 0.1–1.0)
NEUTROS PCT: 58.6 % (ref 43.0–77.0)
Neutro Abs: 3.9 10*3/uL (ref 1.4–7.7)
Platelets: 202 10*3/uL (ref 150.0–400.0)
RBC: 4.91 Mil/uL (ref 4.22–5.81)
RDW: 15.6 % — ABNORMAL HIGH (ref 11.5–15.5)
WBC: 6.7 10*3/uL (ref 4.0–10.5)

## 2015-11-09 LAB — LIPID PANEL
CHOLESTEROL: 101 mg/dL (ref 0–200)
HDL: 44 mg/dL (ref >39.00)
LDL Cholesterol: 35 mg/dL (ref 0–99)
NonHDL: 57.14
Total CHOL/HDL Ratio: 2
Triglycerides: 112 mg/dL (ref 0.0–149.0)
VLDL: 22.4 mg/dL (ref 0.0–40.0)

## 2015-11-09 LAB — VITAMIN B12: Vitamin B-12: >1500 pg/mL — ABNORMAL HIGH (ref 211–911)

## 2015-11-09 LAB — HEPATIC FUNCTION PANEL
ALBUMIN: 4.3 g/dL (ref 3.5–5.2)
ALK PHOS: 35 U/L — AB (ref 39–117)
ALT: 30 U/L (ref 0–53)
AST: 25 U/L (ref 0–37)
Bilirubin, Direct: 0.2 mg/dL (ref 0.0–0.3)
TOTAL PROTEIN: 7 g/dL (ref 6.0–8.3)
Total Bilirubin: 0.6 mg/dL (ref 0.2–1.2)

## 2015-11-09 LAB — HEMOGLOBIN A1C: HEMOGLOBIN A1C: 8.6 % — AB (ref 4.6–6.5)

## 2015-11-09 LAB — IBC PANEL
Iron: 133 ug/dL (ref 42–165)
Saturation Ratios: 33.6 % (ref 20.0–50.0)
Transferrin: 283 mg/dL (ref 212.0–360.0)

## 2015-11-09 LAB — PSA: PSA: 0.17 ng/mL (ref 0.10–4.00)

## 2015-11-09 LAB — TSH: TSH: 2.2 u[IU]/mL (ref 0.35–4.50)

## 2015-11-09 MED ORDER — INSULIN GLARGINE 100 UNIT/ML SOLOSTAR PEN: PEN_INJECTOR | SUBCUTANEOUS | Status: DC

## 2015-11-09 NOTE — Assessment & Plan Note (Addendum)
Lab Results  Component Value Date   HGBA1C 7.1* 11/17/2014   stable overall by history and exam, recent data reviewed with pt, and pt to continue medical treatment as before,  to f/u any worsening symptoms or concerns  In addition to the time spent performing CPE, I spent an additional 25 minutes face to face,in which greater than 50% of this time was spent in counseling and coordination of care for patient's acute illness as documented.

## 2015-11-09 NOTE — Assessment & Plan Note (Signed)
Lab Results  Component Value Date   LDLCALC 34 11/17/2014   stable overall by history and exam, recent data reviewed with pt, and pt to continue medical treatment as before,  to f/u any worsening symptoms or concerns For f/u lab today

## 2015-11-09 NOTE — Assessment & Plan Note (Signed)
Also for f/u cbc with labs

## 2015-11-09 NOTE — Progress Notes (Signed)
Subjective:    Patient ID: Terry Peck, male    DOB: [birth year], 75 y.o.   MRN: [id number]  HPI  Here for wellness and f/u;  Overall doing ok;  Pt denies Chest pain, worsening SOB, DOE, wheezing, orthopnea, PND, worsening LE edema, palpitations, dizziness or syncope.  Pt denies neurological change such as new headache, facial or extremity weakness.  Pt states overall good compliance with treatment and medications, good tolerability, and has been trying to follow appropriate diet.  Pt denies worsening depressive symptoms, suicidal ideation or panic. No fever, night sweats, wt loss, loss of appetite, or other constitutional symptoms.  Pt states good ability with ADL's, has low fall risk, home safety reviewed and adequate, no other significant changes in hearing or vision, and only occasionally active with exercise.  S/p caratact left eye 3 mo ago, already had right done x 3 yrs, no glasses need to drive now.   No recent overt bleeding or bruising;  Last cbc nov 2017 back to normal after severe episode low b12 related /iron def symptomatic anemia 2016.  Pt currently denies Chest pain, worsening SOB, DOE, wheezing, orthopnea, PND, worsening LE edema, palpitations, dizziness or syncope.   Pt denies polydipsia, polyuria, or low sugar symptoms such as weakness or confusion improved with po intake.   trying to follow lower cholesterol, diabetic diet, wt overall stable but little exercise however.   CBG's at home in low 100's Wt Readings from Last 3 Encounters:  11/09/15 187 lb (84.823 kg)  10/18/15 190 lb 6.4 oz (86.365 kg)  08/16/15 189 lb 6.4 oz (85.911 kg)   BP Readings from Last 3 Encounters:  11/09/15 132/80  10/18/15 128/76  08/16/15 125/82   Past Medical History  Diagnosis Date  . Allergy   . GERD (gastroesophageal reflux disease)   . Hypertension   . Colon polyps 11/23/2010  . Barrett's esophagus 11/23/2010  . Abnormal ANCA test 07/03/2011  . Chronic systolic dysfunction of left ventricle      EF 30%  . Pulmonary fibrosis ([place])   . Pulmonary nodules   . Iron deficiency anemia 05/30/2011  . B12 deficiency anemia   . Bundle branch block, left   . ICD (implantable cardiac defibrillator) in place   . Shortness of breath     "related to heart being out of rhythm" (01/29/2012)  . Type II diabetes mellitus ([place])   . Arthritis     "mild; right shoulder" (01/29/2012)  . Liver abscess 1980's  . Nonischemic cardiomyopathy ([place])     s/p St. Jude BiV ICD 01/29/12  . Hyperlipidemia 10/14/2012  . Cataract     L eye surgery  . CHF (congestive heart failure) [facility])     pacemaker   Past Surgical History  Procedure Laterality Date  . Tonsillectomy  1952  . Cardiac defibrillator placement  01/29/2012    SJM Quadra Assura BiV ICD implanted by Dr Rayann Heman  . Abscess drained  ?1980's    liver  . Bi-ventricular implantable cardioverter defibrillator N/A 01/29/2012    Procedure: BI-VENTRICULAR IMPLANTABLE CARDIOVERTER DEFIBRILLATOR  (CRT-D);  Surgeon: Thompson Grayer, MD;  Location: [facility] CATH LAB;  Service: Cardiovascular;  Laterality: N/A;  . Cataract extraction Left   . Colonoscopy    . Upper gi endoscopy      reports that he quit smoking about 27 years ago. His smoking use included Cigarettes. He has a 5 pack-year smoking history. He has never used smokeless tobacco. He reports that he does not drink  alcohol or use illicit drugs. family history includes Heart disease in his mother. There is no history of Colon cancer, Esophageal cancer, or Stomach cancer. Allergies  Allergen Reactions  . Penicillins Anaphylaxis and Other (See Comments)    "swelling of eyes; throat; could breath good" (01/29/2012)  . Iodine Other (See Comments)    "if I get iodine dye, I'll get a fever" (01/29/2012)  . Fish Oil Itching    Pt said only when given through IV   Current Outpatient Prescriptions on File Prior to Visit  Medication Sig Dispense Refill  . aspirin EC 81 MG tablet Take 81 mg by mouth daily.    Marland Kitchen  atorvastatin (LIPITOR) 40 MG tablet TAKE ONE TABLET BY MOUTH ONCE DAILY 90 tablet 0  . BD PEN NEEDLE NANO U/F 32G X 4 MM MISC USE ONE PEN NEEDLE TO INJECT INSULIN SUBCUTANEOUSLY EVERY DAY 100 each 1  . Calcium-Magnesium-Zinc 1000-400-15 MG TABS Take 1 tablet by mouth daily.     . carvedilol (COREG) 25 MG tablet TAKE ONE TABLET BY MOUTH TWICE DAILY WITH MEALS 180 tablet 3  . Cholecalciferol (VITAMIN D) 1000 UNITS capsule Take 1,000 Units by mouth 3 (three) times daily.     . Cyanocobalamin (B-12 PO) Take 1 tablet by mouth 3 (three) times daily.     . ferrous sulfate 325 (65 FE) MG tablet Take 1 tablet (325 mg total) by mouth 3 (three) times daily with meals. 90 tablet 0  . furosemide (LASIX) 40 MG tablet TAKE ONE TABLET BY MOUTH ONCE DAILY 30 tablet 11  . ibuprofen (ADVIL,MOTRIN) 200 MG tablet Take 200-300 mg by mouth 2 (two) times daily as needed. For pain    . Insulin Glargine (LANTUS SOLOSTAR) 100 UNIT/ML Solostar Pen 40 units sq per day 5 pen 11  . Lutein 20 MG CAPS Take 20 mg by mouth daily.    . metFORMIN (GLUCOPHAGE) 850 MG tablet TAKE ONE TABLET BY MOUTH THREE TIMES DAILY 270 tablet 1  . methocarbamol (ROBAXIN) 500 MG tablet TAKE ONE TABLET BY MOUTH ONCE DAILY AS NEEDED 30 tablet 0  . Multiple Vitamin (MULTIVITAMIN WITH MINERALS) TABS Take 1 tablet by mouth daily.    . OMEGA 3 1000 MG CAPS Take 1,000 mg by mouth 2 (two) times daily.     Marland Kitchen omeprazole (PRILOSEC) 20 MG capsule TAKE ONE CAPSULE BY MOUTH TWICE DAILY 180 capsule 0  . pioglitazone (ACTOS) 45 MG tablet TAKE ONE TABLET BY MOUTH AT BEDTIME 90 tablet 0  . [DISCONTINUED] simvastatin (ZOCOR) 80 MG tablet Take 1 tablet (80 mg total) by mouth at bedtime. 90 tablet 3   No current facility-administered medications on file prior to visit.   Review of Systems Constitutional: Negative for increased diaphoresis, or other activity, appetite or siginficant weight change other than noted HENT: Negative for worsening hearing loss, ear pain,  facial swelling, mouth sores and neck stiffness.   Eyes: Negative for other worsening pain, redness or visual disturbance.  Respiratory: Negative for choking or stridor Cardiovascular: Negative for other chest pain and palpitations.  Gastrointestinal: Negative for worsening diarrhea, blood in stool, or abdominal distention Genitourinary: Negative for hematuria, flank pain or change in urine volume.  Musculoskeletal: Negative for myalgias or other joint complaints.  Skin: Negative for other color change and wound or drainage.  Neurological: Negative for syncope and numbness. other than noted Hematological: Negative for adenopathy. or other swelling Psychiatric/Behavioral: Negative for hallucinations, SI, self-injury, decreased concentration or other worsening agitation.  Objective:   Physical Exam BP 132/80 mmHg  Pulse 62  Temp(Src) 97.8 F (36.6 C)  Ht 5\' 8"  (1.727 m)  Wt 187 lb (84.823 kg)  BMI 28.44 kg/m2  SpO2 96% VS noted,  Constitutional: Pt is oriented to person, place, and time. Appears well-developed and well-nourished, in no significant distress Head: Normocephalic and atraumatic  Eyes: Conjunctivae and EOM are normal. Pupils are equal, round, and reactive to light Right Ear: External ear normal.  Left Ear: External ear normal Nose: Nose normal.  Mouth/Throat: Oropharynx is clear and moist  Neck: Normal range of motion. Neck supple. No JVD present. No tracheal deviation present or significant neck LA or mass Cardiovascular: Normal rate, regular rhythm, normal heart sounds and intact distal pulses.   Pulmonary/Chest: Effort normal and breath sounds without rales or wheezing  Abdominal: Soft. Bowel sounds are normal. NT. No HSM  Musculoskeletal: Normal range of motion. Exhibits no edema Lymphadenopathy: Has no cervical adenopathy.  Neurological: Pt is alert and oriented to person, place, and time. Pt has normal reflexes. No cranial nerve deficit. Motor grossly  intact Skin: Skin is warm and dry. No rash noted or new ulcers Psychiatric:  Has normal mood and affect. Behavior is normal Lab Results  Component Value Date   WBC 6.8 03/02/2015   HGB 15.1 03/02/2015   HCT 46.5 03/02/2015   PLT 259.0 03/02/2015   GLUCOSE 110* 11/18/2014   CHOL 104 11/17/2014   TRIG 129.0 11/17/2014   HDL 43.90 11/17/2014   LDLDIRECT 179.2 04/10/2013   LDLCALC 34 11/17/2014   ALT 17 11/17/2014   AST 23 11/17/2014   NA 140 11/18/2014   K 3.9 11/18/2014   CL 106 11/18/2014   CREATININE 0.87 11/18/2014   BUN 26* 11/18/2014   CO2 26 11/18/2014   TSH 2.91 11/17/2014   PSA 0.16 11/17/2014   HGBA1C 7.1* 11/17/2014   MICROALBUR 1.7 11/17/2014   Lab Results  Component Value Date   VITAMINB12 >1500* 03/02/2015   Lab Results  Component Value Date   IRON 261* 03/02/2015   TIBC 542* 11/17/2014   FERRITIN 15.5* 03/02/2015   .     Assessment & Plan:

## 2015-11-09 NOTE — Assessment & Plan Note (Signed)

## 2015-11-09 NOTE — Assessment & Plan Note (Signed)
Also for f/u iron panel

## 2015-11-09 NOTE — Assessment & Plan Note (Signed)
Also for b12 f/u lab today

## 2015-11-09 NOTE — Progress Notes (Signed)
Subjective:   Terry Peck is a 75 y.o. male who presents for Medicare Annual/Subsequent preventive examination.  Review of Systems: by Dr. Jenny Reichmann today  HRA assessment completed during this visit with   The Patient was informed that the wellness visit is to identify future health risk and educate and initiate measures that can reduce risk for increased disease through the lifespan.    Medicare Wellness Visit  Lifestyle review and risk:  HTN managed medically The patient states he feels good; just celebrated 50th anniversary with wife  Stays active in the summer with yard work; [employer] weather permitting   Tobacco: no or smoking cessation discussed: quit; 1090; limited smoking  How many drinks do you have per week? 0   Medications deferred to Dr. Jenny Reichmann  BMI: 28  Diet;  has dropped x 3 lbs (goal is 180)  Breakfast; bowl of grits;  Eat big meal; speghetti; fish and chips;  Lite at hs; soup, sandwich;  Nutritional counseling given: helps his gerds and sleeps much better It is not what he eats but when he eats   Teeth or Denture issues? Has dental care   Exercise;   Does mostly yard work in the summer; states wife has a "jungle" to keep up.  We did walk every am around the block; to warm now; but when the weather is better will start again, but very active keeping up a big yard.    HOME SAFETY; one level ;  Shower; tub; takes baths; in and out of tub; does not have rails but may put them in later; no issue at present  Fall hx; no  Fall risk: Fear of falling? No No climbing anymore; hires this out  Long term goal is to "age in place"  Safety features reviewed for safe community; firearms if in the home; smoke alarms; sun protection when outside; driving difficulties or accidents/ reviewed and no issues  Alarm system  as well   Mental Health:  Any emotional problems? Anxious, depressed, irritable, sad or blue? NEVER  Denies feeling depressed or hopeless; voices pleasure  in daily life How many social activities have you been engaged in within the last 2 weeks?/ no issue   Pain: no  Cognitive; no issue; wife starting to have memory issues; not substantial at present Manages checkbook, medications; no failures of task Ad8 score reviewed for issues;  Issues making decisions; no  Less interest in hobbies / activities" no  Repeats questions, stories; family complaining: NO  Trouble using ordinary gadgets; microwave; computer: no  Forgets the month or year: no  Mismanaging finances: no  Missing apt: no but does write them down  Daily problems with thinking of memory NO Ad8 score is 0   Any dizziness when standing up? no  Mobilization and Functional losses from last year to this year? No Climbs steps / can mow better this year   Sleep pattern changes; yes  Advanced Directive addressed; Stated they had not completed as yet but "is thinking about it" Permission granted to discuss AWV at today's assessment and review [facility] form.   Focused face to face x  20 minutes discussing HCPOA and Living will and reviewed all the questions in the [place] forms. The patient voices understanding of HCPOA; LW reviewed and information provided on each question. Educated on how to revoke this HCPOA or LW at any time.   Also  discussed life prolonging measures (given a few examples) and where he could  choose to initiate or not;  the ability to given the HCPOA power to change his living will or not if he cannot speak for himself; as well as finalizing the will by 2 unrelated witnesses and notary.  Will call for questions and given information on [facility] pastoral department for further assistance.   Counseling Health Maintenance Gaps:  Colonoscopy; 06/2016 DUE EKG: 07/2015 \\Prostate  cancer screening: labs today   Hearing: tinnitis;  States he can't hear over the ringing but has not been an issue for him. He was able to hear our conversation to  day. Has had hearing checked in the past and was told a hearing aid would not help him.  Manageable at present  Ophthalmology exam: Sept of this year Dr. Prudencio Burly  Has a condition that each eye can be 20/20 but eyes do not work together  Is looking into the possibility of getting special lens'  Discussed how any loss of vision and spouse memory may impact his long term desire to stay in the home. Is planning to fup on lens if it is affordable.  Will let us know the outcome; Is independent at present  Immunizations Due: (Vaccines reviewed and educated regarding any overdue)    Barriers to Success Vision in the future Spouse having memory issues; early and manageable at present   Current Care Team reviewed and updated   Education provided and lifestyle risk discussed   All Health Maintenance Gaps Reviewed for closure  Cardiac Risk Factors include: advanced age (>59men, >85 women);dyslipidemia;hypertension;male gender     Objective:    Vitals: BP 132/80 mmHg  Pulse 62  Temp(Src) 97.8 F (36.6 C)  Ht 5\' 8"  (1.727 m)  Wt 187 lb (84.823 kg)  BMI 28.44 kg/m2  SpO2 96%  Body mass index is 28.44 kg/(m^2).  Tobacco History  Smoking status  . Former Smoker -- 0.50 packs/day for 10 years  . Types: Cigarettes  . Quit date: 04/23/1988  Smokeless tobacco  . Never Used     Counseling given: Yes   Past Medical History  Diagnosis Date  . Allergy   . GERD (gastroesophageal reflux disease)   . Hypertension   . Colon polyps 11/23/2010  . Barrett's esophagus 11/23/2010  . Abnormal ANCA test 07/03/2011  . Chronic systolic dysfunction of left ventricle     EF 30%  . Pulmonary fibrosis ([place])   . Pulmonary nodules   . Iron deficiency anemia 05/30/2011  . B12 deficiency anemia   . Bundle branch block, left   . ICD (implantable cardiac defibrillator) in place   . Shortness of breath     "related to heart being out of rhythm" (01/29/2012)  . Type II diabetes mellitus ([place])   . Arthritis       "mild; right shoulder" (01/29/2012)  . Liver abscess 1980's  . Nonischemic cardiomyopathy ([place])     s/p St. Jude BiV ICD 01/29/12  . Hyperlipidemia 10/14/2012  . Cataract     L eye surgery  . CHF (congestive heart failure) [facility]     pacemaker   Past Surgical History  Procedure Laterality Date  . Tonsillectomy  1952  . Cardiac defibrillator placement  01/29/2012    SJM Quadra Assura BiV ICD implanted by Dr Rayann Heman  . Abscess drained  ?1980's    liver  . Bi-ventricular implantable cardioverter defibrillator N/A 01/29/2012    Procedure: BI-VENTRICULAR IMPLANTABLE CARDIOVERTER DEFIBRILLATOR  (CRT-D);  Surgeon: Thompson Grayer, MD;  Location: [facility] CATH LAB;  Service: Cardiovascular;  Laterality: N/A;  . Cataract extraction Left   . Colonoscopy    . Upper gi endoscopy     Family History  Problem Relation Age of Onset  . Heart disease Mother   . Colon cancer Neg Hx   . Esophageal cancer Neg Hx   . Stomach cancer Neg Hx    History  Sexual Activity  . Sexual Activity: Not Currently    Outpatient Encounter Prescriptions as of 11/09/2015  Medication Sig  . aspirin EC 81 MG tablet Take 81 mg by mouth daily.  Marland Kitchen atorvastatin (LIPITOR) 40 MG tablet TAKE ONE TABLET BY MOUTH ONCE DAILY  . BD PEN NEEDLE NANO U/F 32G X 4 MM MISC USE ONE PEN NEEDLE TO INJECT INSULIN SUBCUTANEOUSLY EVERY DAY  . Calcium-Magnesium-Zinc 1000-400-15 MG TABS Take 1 tablet by mouth daily.   . carvedilol (COREG) 25 MG tablet TAKE ONE TABLET BY MOUTH TWICE DAILY WITH MEALS  . Cholecalciferol (VITAMIN D) 1000 UNITS capsule Take 1,000 Units by mouth 3 (three) times daily.   . Cyanocobalamin (B-12 PO) Take 1 tablet by mouth 3 (three) times daily.   . ferrous sulfate 325 (65 FE) MG tablet Take 1 tablet (325 mg total) by mouth 3 (three) times daily with meals.  . furosemide (LASIX) 40 MG tablet TAKE ONE TABLET BY MOUTH ONCE DAILY  . ibuprofen (ADVIL,MOTRIN) 200 MG tablet Take 200-300 mg by mouth 2 (two) times daily as needed.  For pain  . Insulin Glargine (LANTUS SOLOSTAR) 100 UNIT/ML Solostar Pen 40 units sq per day  . Lutein 20 MG CAPS Take 20 mg by mouth daily.  . metFORMIN (GLUCOPHAGE) 850 MG tablet TAKE ONE TABLET BY MOUTH THREE TIMES DAILY  . methocarbamol (ROBAXIN) 500 MG tablet TAKE ONE TABLET BY MOUTH ONCE DAILY AS NEEDED  . Multiple Vitamin (MULTIVITAMIN WITH MINERALS) TABS Take 1 tablet by mouth daily.  . OMEGA 3 1000 MG CAPS Take 1,000 mg by mouth 2 (two) times daily.   Marland Kitchen omeprazole (PRILOSEC) 20 MG capsule TAKE ONE CAPSULE BY MOUTH TWICE DAILY  . pioglitazone (ACTOS) 45 MG tablet TAKE ONE TABLET BY MOUTH AT BEDTIME   No facility-administered encounter medications on file as of 11/09/2015.    Activities of Daily Living In your present state of health, do you have any difficulty performing the following activities: 11/09/2015 11/17/2014  Hearing? Y N  Vision? N N  Difficulty concentrating or making decisions? N N  Walking or climbing stairs? N N  Dressing or bathing? N N  Doing errands, shopping? N N  Preparing Food and eating ? N -  Using the Toilet? N -  In the past six months, have you accidently leaked urine? N -  Do you have problems with loss of bowel control? N -  Managing your Medications? N -  Managing your Finances? N -  Housekeeping or managing your Housekeeping? N -    Patient Care Team: Biagio Borg, MD as PCP - General (Internal Medicine) Thayer Headings, MD as Consulting Physician (Cardiology) Brand Males, MD as Consulting Physician (Pulmonary Disease)   Assessment:     Exercise Activities and Dietary recommendations Current Exercise Habits: Home exercise routine, Time (Minutes): 60, Frequency (Times/Week): 4, Weekly Exercise (Minutes/Week): 240  Goals    . Weight < 180 lb (81.647 kg)     Lose 3 lbs       Fall Risk Fall Risk  11/09/2015 03/12/2014 12/09/2013 04/14/2013  Falls in the past year? No No No No  Depression Screen PHQ 2/9 Scores 11/09/2015 03/12/2014  12/09/2013 04/14/2013  PHQ - 2 Score 0 0 0 0   States he has never been depressed; very calm temperament   Cognitive Testing No flowsheet data found.  Ad8 score is 0   Immunization History  Administered Date(s) Administered  . Influenza Split 01/03/2012  . Influenza Whole 05/03/2011  . Influenza,inj,Quad PF,36+ Mos 03/24/2013, 12/09/2013, 03/02/2015  . Pneumococcal Conjugate-13 10/21/2008, 04/14/2013  . Pneumococcal Polysaccharide-23 01/03/2012  . Tdap 11/23/2010  . Zoster 04/14/2013   Screening Tests Health Maintenance  Topic Date Due  . FOOT EXAM  12/10/2014  . HEMOGLOBIN A1C  05/20/2015  . OPHTHALMOLOGY EXAM  01/21/2016 (Originally 06/18/2015)  . URINE MICROALBUMIN  11/17/2015  . INFLUENZA VACCINE  11/22/2015  . COLONOSCOPY  07/17/2016  . TETANUS/TDAP  11/22/2020  . ZOSTAVAX  Completed  . PNA vac Low Risk Adult  Completed      Plan:     Will fup with rails in bathroom if needed Will have eye exam in Sept for annual visit; Dr. Prudencio Burly Advanced Directive reviewed in detail and given information about the pastoral dept or SW to assist with further questions as needed.  During the course of the visit the patient was educated and counseled about the following appropriate screening and preventive services: (See note above)   Vaccines to include Pneumoccal, Influenza, Hepatitis B, Td, Zostavax, HCV  Electrocardiogram  Cardiovascular Disease  Colorectal cancer screening / 06/2016  Diabetes screening  Prostate Cancer Screening  Glaucoma screening/ annual eye exam due Sept   Nutrition counseling   Smoking cessation counseling  Patient Instructions (the written plan) was given to the patient.    Wynetta Fines, RN  11/09/2015

## 2015-11-09 NOTE — Assessment & Plan Note (Signed)
stable overall by history and exam, recent data reviewed with pt, and pt to continue medical treatment as before,  to f/u any worsening symptoms or concerns BP Readings from Last 3 Encounters:  11/09/15 132/80  10/18/15 128/76  08/16/15 125/82

## 2015-11-09 NOTE — Patient Instructions (Addendum)
Please continue all other medications as before, and refills have been done if requested.  Please have the pharmacy call with any other refills you may need.  Please continue your efforts at being more active, low cholesterol diet, and weight control.  You are otherwise up to date with prevention measures today.  Please keep your appointments with your specialists as you may have planned  Please go to the LAB in the Basement (turn left off the elevator) for the tests to be done today  You will be contacted by phone if any changes need to be made immediately.  Otherwise, you will receive a letter about your results with an explanation, but please check with MyChart first.  Please remember to sign up for MyChart if you have not done so, as this will be important to you in the future with finding out test results, communicating by private email, and scheduling acute appointments online when needed.  Please return in 6 months, or sooner if needed, with Lab testing done 3-5 days before  Terry Peck , Thank you for taking time to come for your Medicare Wellness Visit. I appreciate your ongoing commitment to your health goals. Please review the following plan we discussed and let me know if I can assist you in the future.   Please let us know how the lens work out   These are the goals we discussed: Goals    . Weight < 180 lb (81.647 kg)     Lose 3 lbs        This is a list of the screening recommended for you and due dates:  Health Maintenance  Topic Date Due  . Complete foot exam   12/10/2014  . Hemoglobin A1C  05/20/2015  . Eye exam for diabetics  01/21/2016*  . Urine Protein Check  11/17/2015  . Flu Shot  11/22/2015  . Colon Cancer Screening  07/17/2016  . Tetanus Vaccine  11/22/2020  . Shingles Vaccine  Completed  . Pneumonia vaccines  Completed  *Topic was postponed. The date shown is not the original due date.

## 2015-11-22 ENCOUNTER — Ambulatory Visit (INDEPENDENT_AMBULATORY_CARE_PROVIDER_SITE_OTHER): Payer: Medicare Other | Admitting: *Deleted

## 2015-11-22 DIAGNOSIS — I5022 Chronic systolic (congestive) heart failure: Secondary | ICD-10-CM | POA: Diagnosis not present

## 2015-11-22 DIAGNOSIS — Z95810 Presence of automatic (implantable) cardiac defibrillator: Secondary | ICD-10-CM | POA: Diagnosis not present

## 2015-11-22 NOTE — Progress Notes (Signed)
Remote ICD transmission.   

## 2015-11-22 NOTE — Progress Notes (Signed)
EPIC Encounter for ICM Monitoring  Patient Name: Terry Peck is a 75 y.o. male Date: 11/22/2015 Primary Care Physican: Cathlean Cower, MD Primary Cardiologist: Nahser Electrophysiologist: Allred Dry Weight: 183 lb  Bi-V Pacing:  >99%       Heart Failure questions reviewed, pt asymptomatic  Thoracic impedance decreased suggesting fluid accumulation 11/03/2015 to 11/13/2015 and 11/17/2015 to 11/21/2015.  Impedance returned to baseline 11/21/2015.  Recommendations:  No changes.  Low sodium diet education provided   ICM trend: 11/22/2015     Follow-up plan: ICM clinic phone appointment on 12/23/2015.  Copy of ICM check sent to primary cardiologist and device physician.   Rosalene Billings, RN 11/22/2015 2:05 PM

## 2015-11-25 LAB — CUP PACEART REMOTE DEVICE CHECK
Brady Statistic AS VS Percent: 1 %
HighPow Impedance: 74 Ohm
HighPow Impedance: 74 Ohm
Implantable Lead Implant Date: 20131008
Implantable Lead Location: 753858
Implantable Lead Location: 753860
Lead Channel Impedance Value: 410 Ohm
Lead Channel Impedance Value: 980 Ohm
Lead Channel Pacing Threshold Amplitude: 0.75 V
Lead Channel Pacing Threshold Pulse Width: 0.5 ms
Lead Channel Sensing Intrinsic Amplitude: 11.4 mV
Lead Channel Sensing Intrinsic Amplitude: 4.2 mV
Lead Channel Setting Pacing Amplitude: 1.75 V
Lead Channel Setting Pacing Amplitude: 2 V
Lead Channel Setting Pacing Amplitude: 2 V
Lead Channel Setting Pacing Pulse Width: 0.5 ms
Lead Channel Setting Pacing Pulse Width: 0.5 ms
MDC IDC LEAD IMPLANT DT: 20131008
MDC IDC LEAD IMPLANT DT: 20131008
MDC IDC LEAD LOCATION: 753859
MDC IDC MSMT BATTERY REMAINING LONGEVITY: 48 mo
MDC IDC MSMT BATTERY REMAINING PERCENTAGE: 53 %
MDC IDC MSMT BATTERY VOLTAGE: 2.93 V
MDC IDC MSMT LEADCHNL LV PACING THRESHOLD AMPLITUDE: 0.75 V
MDC IDC MSMT LEADCHNL LV PACING THRESHOLD PULSEWIDTH: 0.5 ms
MDC IDC MSMT LEADCHNL RV IMPEDANCE VALUE: 410 Ohm
MDC IDC MSMT LEADCHNL RV PACING THRESHOLD AMPLITUDE: 0.875 V
MDC IDC MSMT LEADCHNL RV PACING THRESHOLD PULSEWIDTH: 0.5 ms
MDC IDC PG SERIAL: 7054987
MDC IDC SESS DTM: 20170801060016
MDC IDC SET LEADCHNL RV SENSING SENSITIVITY: 0.5 mV
MDC IDC STAT BRADY AP VP PERCENT: 18 %
MDC IDC STAT BRADY AP VS PERCENT: 1 %
MDC IDC STAT BRADY AS VP PERCENT: 82 %
MDC IDC STAT BRADY RA PERCENT PACED: 18 %

## 2015-11-29 ENCOUNTER — Encounter: Payer: Self-pay | Admitting: Pulmonary Disease

## 2015-11-29 ENCOUNTER — Encounter: Payer: Self-pay | Admitting: Cardiology

## 2015-12-07 ENCOUNTER — Other Ambulatory Visit: Payer: Self-pay | Admitting: Internal Medicine

## 2015-12-17 ENCOUNTER — Encounter: Payer: Self-pay | Admitting: Cardiovascular Disease

## 2015-12-23 ENCOUNTER — Ambulatory Visit (INDEPENDENT_AMBULATORY_CARE_PROVIDER_SITE_OTHER): Payer: Medicare Other

## 2015-12-23 DIAGNOSIS — I5022 Chronic systolic (congestive) heart failure: Secondary | ICD-10-CM | POA: Diagnosis not present

## 2015-12-23 DIAGNOSIS — Z95810 Presence of automatic (implantable) cardiac defibrillator: Secondary | ICD-10-CM | POA: Diagnosis not present

## 2015-12-23 NOTE — Progress Notes (Signed)
EPIC Encounter for ICM Monitoring  Patient Name: Terry Peck is a 75 y.o. male Date: 12/23/2015 Primary Care Physican: Cathlean Cower, MD Primary Cardiologist: Nahser Electrophysiologist: Allred Dry Weight: 181 lb  Bi-V Pacing:  99%       Heart Failure questions reviewed, pt asymptomatic   Thoracic impedance normal.  Recommendations: No changes.  Low sodium diet education provided.    Follow-up plan: ICM clinic phone appointment on 01/25/2016.  Copy of ICM check sent to device physician.   ICM trend: 12/23/2015       Rosalene Billings, RN 12/23/2015 9:08 AM

## 2016-01-08 ENCOUNTER — Other Ambulatory Visit: Payer: Self-pay | Admitting: Internal Medicine

## 2016-01-12 ENCOUNTER — Encounter: Payer: Self-pay | Admitting: Internal Medicine

## 2016-01-12 MED ORDER — INSULIN GLARGINE 100 UNIT/ML SOLOSTAR PEN: PEN_INJECTOR | SUBCUTANEOUS | 11 refills | Status: DC

## 2016-01-16 ENCOUNTER — Other Ambulatory Visit: Payer: Self-pay | Admitting: Internal Medicine

## 2016-01-16 ENCOUNTER — Encounter: Payer: Self-pay | Admitting: Internal Medicine

## 2016-01-25 ENCOUNTER — Ambulatory Visit (INDEPENDENT_AMBULATORY_CARE_PROVIDER_SITE_OTHER): Payer: Medicare Other

## 2016-01-25 DIAGNOSIS — Z95810 Presence of automatic (implantable) cardiac defibrillator: Secondary | ICD-10-CM | POA: Diagnosis not present

## 2016-01-25 DIAGNOSIS — I5022 Chronic systolic (congestive) heart failure: Secondary | ICD-10-CM | POA: Diagnosis not present

## 2016-01-26 ENCOUNTER — Other Ambulatory Visit: Payer: Self-pay | Admitting: Internal Medicine

## 2016-01-26 NOTE — Progress Notes (Signed)
EPIC Encounter for ICM Monitoring  Patient Name: Terry Peck is a 75 y.o. male Date: 01/26/2016 Primary Care Physican: Cathlean Cower, MD Primary Cardiologist:Nahser Electrophysiologist: Allred Dry Weight: 181 lb  Bi-V Pacing:  >99%       Heart Failure questions reviewed, pt asymptomatic   Thoracic impedance normal   Recommendations: No changes.  Low sodium diet education provided.    Follow-up plan: ICM clinic phone appointment on 03/05/2016.  Office visit with Chanetta Marshall, NP 02/02/2016 and Dr Acie Fredrickson 02/17/2016  Copy of ICM check sent to device physician.   ICM trend: 01/25/2016       Rosalene Billings, RN 01/26/2016 10:35 AM

## 2016-02-01 NOTE — Progress Notes (Signed)
Electrophysiology Office Note Date: 02/02/2016  ID:  Terry Peck, Terry Peck 1940/06/05, MRN [id number]  PCP: Cathlean Cower, MD Primary Cardiologist: Nahser Electrophysiologist: Rayann Heman  CC: Routine ICD follow-up  Terry Peck is a 75 y.o. male seen today for Dr Rayann Heman.  He presents today for routine electrophysiology followup.  Since last being seen in our clinic, the patient reports doing very well. He and his wife enjoy working in the yard and going to [facility].  He denies chest pain, palpitations, dyspnea, PND, orthopnea, nausea, vomiting, dizziness, syncope, edema, weight gain, or early satiety.  He has not had ICD shocks.   Device History: SJT CRTD implanted 2013 for NICM,CHF History of appropriate therapy: no History of AAD therapy: no   Past Medical History:  Diagnosis Date  . Abnormal ANCA test 07/03/2011  . Allergy   . Arthritis    "mild; right shoulder" (01/29/2012)  . B12 deficiency anemia   . Barrett's esophagus 11/23/2010  . Bundle branch block, left   . Cataract    L eye surgery  . CHF (congestive heart failure) ([place])    pacemaker  . Chronic systolic dysfunction of left ventricle    EF 30%  . Colon polyps 11/23/2010  . GERD (gastroesophageal reflux disease)   . Hyperlipidemia 10/14/2012  . Hypertension   . ICD (implantable cardiac defibrillator) in place   . Iron deficiency anemia 05/30/2011  . Liver abscess 1980's  . Nonischemic cardiomyopathy ([place])    s/p St. Jude BiV ICD 01/29/12  . Pulmonary fibrosis ([place])   . Pulmonary nodules   . Shortness of breath    "related to heart being out of rhythm" (01/29/2012)  . Type II diabetes mellitus ([place])    Past Surgical History:  Procedure Laterality Date  . abscess drained  ?1980's   liver  . BI-VENTRICULAR IMPLANTABLE CARDIOVERTER DEFIBRILLATOR N/A 01/29/2012   Procedure: BI-VENTRICULAR IMPLANTABLE CARDIOVERTER DEFIBRILLATOR  (CRT-D);  Surgeon: Thompson Grayer, MD;  Location: [facility] CATH LAB;  Service: Cardiovascular;   Laterality: N/A;  . CARDIAC DEFIBRILLATOR PLACEMENT  01/29/2012   SJM Quadra Assura BiV ICD implanted by Dr Rayann Heman  . CATARACT EXTRACTION Left   . COLONOSCOPY    . TONSILLECTOMY  1952  . UPPER GI ENDOSCOPY      Current Outpatient Prescriptions  Medication Sig Dispense Refill  . aspirin EC 81 MG tablet Take 81 mg by mouth daily.    Marland Kitchen atorvastatin (LIPITOR) 40 MG tablet TAKE ONE TABLET BY MOUTH ONCE DAILY 90 tablet 0  . BD PEN NEEDLE NANO U/F 32G X 4 MM MISC USE ONE  ONCE DAILY 100 each 1  . Calcium-Magnesium-Zinc 1000-400-15 MG TABS Take 1 tablet by mouth daily.     . carvedilol (COREG) 25 MG tablet TAKE ONE TABLET BY MOUTH TWICE DAILY WITH MEALS 180 tablet 3  . Cholecalciferol (VITAMIN D) 1000 UNITS capsule Take 1,000 Units by mouth 3 (three) times daily.     . Cyanocobalamin (B-12 PO) Take 1 tablet by mouth 3 (three) times daily.     . ferrous sulfate 325 (65 FE) MG tablet Take 1 tablet (325 mg total) by mouth 3 (three) times daily with meals. 90 tablet 0  . furosemide (LASIX) 40 MG tablet TAKE ONE TABLET BY MOUTH ONCE DAILY 30 tablet 11  . ibuprofen (ADVIL,MOTRIN) 200 MG tablet Take 200-300 mg by mouth 2 (two) times daily as needed. For pain    . Insulin Glargine (LANTUS SOLOSTAR) 100 UNIT/ML Solostar Pen 50 units  sq per day 5 pen 11  . LANTUS SOLOSTAR 100 UNIT/ML Solostar Pen INJECT 40 UNITS SUBCUTANEOUSLY ONCE DAILY 3 mL 11  . Lutein 20 MG CAPS Take 20 mg by mouth daily.    . metFORMIN (GLUCOPHAGE) 850 MG tablet TAKE ONE TABLET BY MOUTH THREE TIMES DAILY 270 tablet 1  . methocarbamol (ROBAXIN) 500 MG tablet TAKE ONE TABLET BY MOUTH ONCE DAILY AS NEEDED 30 tablet 0  . Multiple Vitamin (MULTIVITAMIN WITH MINERALS) TABS Take 1 tablet by mouth daily.    . OMEGA 3 1000 MG CAPS Take 1,000 mg by mouth 2 (two) times daily.     Marland Kitchen omeprazole (PRILOSEC) 20 MG capsule TAKE ONE CAPSULE BY MOUTH TWICE DAILY 180 capsule 0  . pioglitazone (ACTOS) 45 MG tablet TAKE ONE TABLET BY MOUTH AT BEDTIME 90  tablet 0   No current facility-administered medications for this visit.     Allergies:   Penicillins; Iodine; and Fish oil   Social History: Social History   Social History  . Marital status: Married    Spouse name: N/A  . Number of children: 2  . Years of education: 16   Occupational History  . Shipping/Receiving Set designer Mathematics--Retired    Social History Main Topics  . Smoking status: Former Smoker    Packs/day: 0.50    Years: 10.00    Types: Cigarettes    Quit date: 04/23/1988  . Smokeless tobacco: Never Used  . Alcohol use No  . Drug use: No  . Sexual activity: Not Currently   Other Topics Concern  . Not on file   Social History Narrative   Lives in [place] with spouse.  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      Family History: Family History  Problem Relation Age of Onset  . Heart disease Mother   . Colon cancer Neg Hx   . Esophageal cancer Neg Hx   . Stomach cancer Neg Hx     Review of Systems: All other systems reviewed and are otherwise negative except as noted above.   Physical Exam: VS:  BP (!) 166/80   Pulse 87   Ht 5\' 8"  (1.727 m)   Wt 189 lb 6.4 oz (85.9 kg)   SpO2 98%   BMI 28.80 kg/m  , BMI Body mass index is 28.8 kg/m.  GEN- The patient is elderly and obese appearing, alert and oriented x 3 today.   HEENT: normocephalic, atraumatic; sclera clear, conjunctiva pink; hearing intact; oropharynx clear; neck supple Lungs- Clear to ausculation bilaterally, normal work of breathing.  No wheezes, rales, rhonchi Heart- Regular rate and rhythm (paced)  GI- soft, non-tender, non-distended, bowel sounds present Extremities- no clubbing, cyanosis, or edema  MS- no significant deformity or atrophy Skin- warm and dry, no rash or lesion; ICD pocket well healed Psych- euthymic mood, full affect Neuro- strength and sensation are intact  ICD interrogation- reviewed in detail today,  See  PACEART report  EKG:  EKG is not ordered today.  Recent Labs: 11/09/2015: ALT 30; BUN 18; Creatinine, Ser 0.82; Hemoglobin 15.2; Platelets 202.0; Potassium 4.1; Sodium 138; TSH 2.20   Wt Readings from Last 3 Encounters:  02/02/16 189 lb 6.4 oz (85.9 kg)  11/09/15 187 lb (84.8 kg)  10/18/15 190 lb 6.4 oz (86.4 kg)     Other studies Reviewed: Additional studies/ records that were reviewed today include: Dr Acie Fredrickson and Dr Jackalyn Lombard office notes  Assessment  and Plan:  1.  Chronic systolic dysfunction Resolved post CRT implant euvolemic today Stable on an appropriate medical regimen Normal ICD function See Pace Art report No changes today I have discussed SJM Fortify Assura advisary with the patient today. He understands that recommendation from SJM is to not replace the device at this time. The patient is not device dependant.  The patient has not had appropriate device therapy in the past or implanted for secondary prevention.  Vibratory alert demonstrated today.  He is actively remotely monitored and understands the importance of compliance today.   2. HTN Stable No change required today   Current medicines are reviewed at length with the patient today.   The patient does not have concerns regarding his medicines.  The following changes were made today:  none  Labs/ tests ordered today include: none No orders of the defined types were placed in this encounter.    Disposition:   Follow up with Delilah Shan, ICM clinic, Dr Acie Fredrickson as scheduled, me in 1 year    Signed, Chanetta Marshall, NP 02/02/2016 8:00 AM  [place]  [address] [phone] (office) [phone] (fax

## 2016-02-02 ENCOUNTER — Ambulatory Visit (INDEPENDENT_AMBULATORY_CARE_PROVIDER_SITE_OTHER): Payer: Medicare Other | Admitting: Nurse Practitioner

## 2016-02-02 ENCOUNTER — Encounter: Payer: Self-pay | Admitting: Nurse Practitioner

## 2016-02-02 ENCOUNTER — Encounter: Payer: Self-pay | Admitting: Internal Medicine

## 2016-02-02 VITALS — BP 166/80 | HR 87 | Ht 68.0 in | Wt 189.4 lb

## 2016-02-02 DIAGNOSIS — I5022 Chronic systolic (congestive) heart failure: Secondary | ICD-10-CM | POA: Diagnosis not present

## 2016-02-02 DIAGNOSIS — I10 Essential (primary) hypertension: Secondary | ICD-10-CM

## 2016-02-02 LAB — CUP PACEART INCLINIC DEVICE CHECK
Implantable Lead Implant Date: 20131008
Implantable Lead Location: 753858
MDC IDC LEAD IMPLANT DT: 20131008
MDC IDC LEAD IMPLANT DT: 20131008
MDC IDC LEAD LOCATION: 753859
MDC IDC LEAD LOCATION: 753860
MDC IDC SESS DTM: 20171012080245
Pulse Gen Serial Number: 7054987

## 2016-02-02 NOTE — Patient Instructions (Addendum)
Medication Instructions:   Your physician recommends that you continue on your current medications as directed. Please refer to the Current Medication list given to you today.   If you need a refill on your cardiac medications before your next appointment, please call your pharmacy.  Labwork: NONE ORDER TODAY    Testing/Procedures: NONE ORDER TODAY    Follow-Up:  Your physician wants you to follow-up in: [place] will receive a reminder letter in the mail two months in advance. If you don't receive a letter, please call our office to schedule the follow-up appointment.  Remote monitoring is used to monitor your Pacemaker of ICD from home. This monitoring reduces the number of office visits required to check your device to one time per year. It allows [place] to keep an eye on the functioning of your device to ensure it is working properly. You are scheduled for a device check from home on . 1/12/18You may send your transmission at any time that day. If you have a wireless device, the transmission will be sent automatically. After your physician reviews your transmission, you will receive a postcard with your next transmission date. \   Any Other Special Instructions Will Be Listed Below (If Applicable).

## 2016-02-07 ENCOUNTER — Other Ambulatory Visit: Payer: Self-pay | Admitting: *Deleted

## 2016-02-07 ENCOUNTER — Encounter: Payer: Self-pay | Admitting: Cardiovascular Disease

## 2016-02-07 DIAGNOSIS — I10 Essential (primary) hypertension: Secondary | ICD-10-CM

## 2016-02-07 DIAGNOSIS — Z79899 Other long term (current) drug therapy: Secondary | ICD-10-CM

## 2016-02-07 DIAGNOSIS — E7800 Pure hypercholesterolemia, unspecified: Secondary | ICD-10-CM

## 2016-02-09 ENCOUNTER — Other Ambulatory Visit: Payer: Medicare Other | Admitting: *Deleted

## 2016-02-09 DIAGNOSIS — Z79899 Other long term (current) drug therapy: Secondary | ICD-10-CM

## 2016-02-09 DIAGNOSIS — I10 Essential (primary) hypertension: Secondary | ICD-10-CM | POA: Diagnosis not present

## 2016-02-09 DIAGNOSIS — E7800 Pure hypercholesterolemia, unspecified: Secondary | ICD-10-CM

## 2016-02-09 LAB — HEPATIC FUNCTION PANEL
ALT: 21 U/L (ref 9–46)
AST: 24 U/L (ref 10–35)
Albumin: 4 g/dL (ref 3.6–5.1)
Alkaline Phosphatase: 40 U/L (ref 40–115)
BILIRUBIN DIRECT: 0.1 mg/dL (ref <=0.2)
BILIRUBIN INDIRECT: 0.2 mg/dL (ref 0.2–1.2)
BILIRUBIN TOTAL: 0.3 mg/dL (ref 0.2–1.2)
Total Protein: 7.1 g/dL (ref 6.1–8.1)

## 2016-02-09 LAB — BASIC METABOLIC PANEL
BUN: 15 mg/dL (ref 7–25)
CHLORIDE: 103 mmol/L (ref 98–110)
CO2: 27 mmol/L (ref 20–31)
Calcium: 9.4 mg/dL (ref 8.6–10.3)
Creat: 0.79 mg/dL (ref 0.70–1.18)
GLUCOSE: 71 mg/dL (ref 65–99)
POTASSIUM: 5.1 mmol/L (ref 3.5–5.3)
Sodium: 140 mmol/L (ref 135–146)

## 2016-02-09 LAB — LIPID PANEL
Cholesterol: 202 mg/dL — ABNORMAL HIGH (ref 125–200)
HDL: 40 mg/dL (ref >=40)
LDL CALC: 127 mg/dL (ref <130)
TRIGLYCERIDES: 176 mg/dL — AB (ref <150)
Total CHOL/HDL Ratio: 5.1 Ratio — ABNORMAL HIGH (ref <=5.0)
VLDL: 35 mg/dL — AB (ref <30)

## 2016-02-13 ENCOUNTER — Encounter: Payer: Self-pay | Admitting: Cardiovascular Disease

## 2016-02-13 ENCOUNTER — Ambulatory Visit (INDEPENDENT_AMBULATORY_CARE_PROVIDER_SITE_OTHER): Payer: Medicare Other | Admitting: Cardiovascular Disease

## 2016-02-13 VITALS — BP 134/76 | HR 64 | Ht 68.0 in | Wt 191.0 lb

## 2016-02-13 DIAGNOSIS — E782 Mixed hyperlipidemia: Secondary | ICD-10-CM

## 2016-02-13 DIAGNOSIS — J84112 Idiopathic pulmonary fibrosis: Secondary | ICD-10-CM

## 2016-02-13 DIAGNOSIS — I447 Left bundle-branch block, unspecified: Secondary | ICD-10-CM

## 2016-02-13 DIAGNOSIS — I5022 Chronic systolic (congestive) heart failure: Secondary | ICD-10-CM | POA: Diagnosis not present

## 2016-02-13 MED ORDER — ATORVASTATIN CALCIUM 20 MG PO TABS: 20.0000 mg | ORAL_TABLET | Freq: Every day | ORAL | 3 refills | Status: DC

## 2016-02-13 NOTE — Patient Instructions (Signed)
Medication Instructions:  DECREASE Atorvastatin to 20 mg daily   Labwork: Your physician recommends that you return for lab work in: 3 months   You will need to FAST for this appointment - nothing to eat or drink after midnight the night before except water.   Testing/Procedures: None Ordered   Follow-Up: Your physician wants you to follow-up in: 6 months with Dr. Acie Fredrickson.  You will receive a reminder letter in the mail two months in advance. If you don't receive a letter, please call our office to schedule the follow-up appointment.   If you need a refill on your cardiac medications before your next appointment, please call your pharmacy.   Thank you for choosing [facility]! Christen Bame, RN [phone]

## 2016-02-13 NOTE — Progress Notes (Signed)
Terry Peck Date of Birth  08-24-1940       [address]    [place]    [place], [place]  [address]     [phone]  Fax  [phone]    Problem list: 1. Left bundle branch block 2. Diabetes mellitus 3. Hyperlipidemia 4. Hypertension 5. Congestive heart failure-EF equals 30-35% - has now normalized with Bi-V pacer  6. Bi-V ICD placement 7. Idiopathic Pulmonary fibrosis    Previous notes:  Terry Peck is a 75 yo who presents for evaluation of dyspnea.  He has been found to have a LBBB and has CHF with an EF of 30-35%.  He gets dyspneac with exercise ( mowing the lawn).  He denies any PND or orthopnea. He describes generalized fatigue with exertion.  The shortness of breath has been present for about a year. He admits to eating a fair amount of salty foods. He eats hot dogs, ham, bacon, sausage. He does not use any exercise but instead uses potassium chloride ( No salt).  He was seen by his medical doctor and was found to have a left bundle branch block. and was referred here for further evaluation. It was at that point that he had an echocardiogram which revealed global left ventricular systolic dysfunction with an EF of 30-35%.  He had a stress Myoview study which reveals an ejection fraction 29%. There is global left ventricular dilatation. There is no evidence of ischemia. His symptoms have improved dramatically since we've stopped his verapamil and start him on carvedilol.  Continue to gradually titrate up his medications. We change his HCTZ to Lasix during his last visit. We also added lisinopril. He's feeling better.  He is able to do more of his normal activities without severe dyspnea.  December 10, 2011 He continues to gradually and steadily improved. He is now able to mow his lawn and edge  without any difficulties. He's doing quite well at cardiac rehabilitation to  October 17, 2012:  Terry Peck is doing great.  He has definitely seen a benefit from the BI-V pacer / ICD.   He's able to do all of his yard work without significant difficulty or shortness of breath.  No chest pain.  We have not done a cardiac cath - he was not found to have significant ischemia on myoview and he has made tremendous progress with medical therapy for his CHF.   Echo in April , 2014 Study Conclusions  - Left ventricle: The cavity size was normal. Wall thickness was increased in a pattern of severe LVH. Systolic function was normal. The estimated ejection fraction was 55%. Wall motion was normal; there were no regional wall motion abnormalities. - Left atrium: The atrium was mildly dilated. - Atrial septum: No defect or patent foramen ovale was identified.  His LV EF has normalized with medical therapy and the ICD.   Dec. 2, 2014:  Terry Peck is doing well.  Exercising well.  No CP, no dyspnea.    November 02, 2013:  Terry Peck is doing well. He did not take his furosemide yesterday or today and so his blood pressure is a little bit higher. He takes his blood pressure on a regular basis at home and his readings are in the normal range.  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 13, 2016 Terry Peck is a 75 yo with hx of chronic systolic CHF.  He is s/p  Bi - V  ICD placement.   His EF has normalized since placement of the Bi-V ICD.  He is feeling much better. Exercising and doing all that he can do.   He now has a Optivol ( or equivalent)  Feature enabled .  Volume status is good.   November 01, 2014:  Has some DOE recently .  Very mild  Not eating any extra salt.   Oct. 27, 2016:  Terry Peck is doing well. Has normalized his LV function with the Bi-V pacer   August 16, 2015:  Terry Peck is seen back for follow up of his chf BP is elevated. Getting more normal readings at home  EF has normalized with his BI-V pacer   Oct. 23, 2017:   Doing well from a cardiac standpoint Busy doing yard work .  He had stopped his atorvastatin to see if that  would affect his blood glucose. -   His glucose was lower when he was off the statin . Lipids went up significantly .   Current Outpatient Prescriptions on File Prior to Visit  Medication Sig Dispense Refill  . aspirin EC 81 MG tablet Take 81 mg by mouth daily.    Marland Kitchen atorvastatin (LIPITOR) 40 MG tablet TAKE ONE TABLET BY MOUTH ONCE DAILY 90 tablet 0  . BD PEN NEEDLE NANO U/F 32G X 4 MM MISC USE ONE  ONCE DAILY 100 each 1  . Calcium-Magnesium-Zinc 1000-400-15 MG TABS Take 1 tablet by mouth daily.     . carvedilol (COREG) 25 MG tablet TAKE ONE TABLET BY MOUTH TWICE DAILY WITH MEALS 180 tablet 3  . Cholecalciferol (VITAMIN D) 1000 UNITS capsule Take 1,000 Units by mouth 3 (three) times daily.     . Cyanocobalamin (B-12 PO) Take 1 tablet by mouth 3 (three) times daily.     . furosemide (LASIX) 40 MG tablet TAKE ONE TABLET BY MOUTH ONCE DAILY 30 tablet 11  . ibuprofen (ADVIL,MOTRIN) 200 MG tablet Take 200-300 mg by mouth 2 (two) times daily as needed. For pain    . LANTUS SOLOSTAR 100 UNIT/ML Solostar Pen INJECT 40 UNITS SUBCUTANEOUSLY ONCE DAILY 3 mL 11  . Lutein 20 MG CAPS Take 20 mg by mouth daily.    . metFORMIN (GLUCOPHAGE) 850 MG tablet TAKE ONE TABLET BY MOUTH THREE TIMES DAILY 270 tablet 1  . methocarbamol (ROBAXIN) 500 MG tablet TAKE ONE TABLET BY MOUTH ONCE DAILY AS NEEDED 30 tablet 0  . Multiple Vitamin (MULTIVITAMIN WITH MINERALS) TABS Take 1 tablet by mouth daily.    . OMEGA 3 1000 MG CAPS Take 1,000 mg by mouth 2 (two) times daily.     Marland Kitchen omeprazole (PRILOSEC) 20 MG capsule TAKE ONE CAPSULE BY MOUTH TWICE DAILY 180 capsule 0  . pioglitazone (ACTOS) 45 MG tablet TAKE ONE TABLET BY MOUTH AT BEDTIME 90 tablet 0  . [DISCONTINUED] simvastatin (ZOCOR) 80 MG tablet Take 1 tablet (80 mg total) by mouth at bedtime. 90 tablet 3   No current facility-administered medications on file prior to visit.     Allergies  Allergen Reactions  . Penicillins Anaphylaxis and Other (See Comments)     "swelling of eyes; throat; could breath good" (01/29/2012)  . Iodine Other (See Comments)    "if I get iodine dye, I'll get a fever" (01/29/2012)  . Fish Oil Itching    Pt said only when given through IV    Past Medical History:  Diagnosis Date  . Abnormal ANCA test 07/03/2011  .  Allergy   . Arthritis    "mild; right shoulder" (01/29/2012)  . B12 deficiency anemia   . Barrett's esophagus 11/23/2010  . Bundle branch block, left   . Cataract    L eye surgery  . CHF (congestive heart failure) ([place])    pacemaker  . Chronic systolic dysfunction of left ventricle    EF 30%  . Colon polyps 11/23/2010  . GERD (gastroesophageal reflux disease)   . Hyperlipidemia 10/14/2012  . Hypertension   . ICD (implantable cardiac defibrillator) in place   . Iron deficiency anemia 05/30/2011  . Liver abscess 1980's  . Nonischemic cardiomyopathy ([place])    s/p St. Jude BiV ICD 01/29/12  . Pulmonary fibrosis ([place])   . Pulmonary nodules   . Shortness of breath    "related to heart being out of rhythm" (01/29/2012)  . Type II diabetes mellitus ([place])     Past Surgical History:  Procedure Laterality Date  . abscess drained  ?1980's   liver  . BI-VENTRICULAR IMPLANTABLE CARDIOVERTER DEFIBRILLATOR N/A 01/29/2012   Procedure: BI-VENTRICULAR IMPLANTABLE CARDIOVERTER DEFIBRILLATOR  (CRT-D);  Surgeon: Thompson Grayer, MD;  Location[facility] LAB;  Service: Cardiovascular;  Laterality: N/A;  . CARDIAC DEFIBRILLATOR PLACEMENT  01/29/2012   SJM Quadra Assura BiV ICD implanted by Dr Rayann Heman  . CATARACT EXTRACTION Left   . COLONOSCOPY    . TONSILLECTOMY  1952  . UPPER GI ENDOSCOPY      History  Smoking Status  . Former Smoker  . Packs/day: 0.50  . Years: 10.00  . Types: Cigarettes  . Quit date: 04/23/1988  Smokeless Tobacco  . Never Used    History  Alcohol Use No    Family History  Problem Relation Age of Onset  . Heart disease Mother   . Colon cancer Neg Hx   . Esophageal cancer Neg Hx   . Stomach cancer  Neg Hx     Reviw of Systems:  Reviewed in the HPI.  All other systems are negative.  Physical Exam: Blood pressure 134/76, pulse 64, height 5\' 8"  (1.727 m), weight 191 lb (86.6 kg). General: Well developed, well nourished, in no acute distress.  Head: Normocephalic, atraumatic, sclera non-icteric, mucus membranes are moist,   Neck: Supple. Carotids are 2 + without bruits. No JVD  Lungs:  Few fine rales bilaterally, R>L.   Heart: regular rate.  normal  S1 S2.  2/6 systolic murmur   Abdomen: Soft, non-tender, non-distended with normal bowel sounds. No hepatomegaly. No rebound/guarding. No masses.  Msk:  Strength and tone are normal  Extremities: No clubbing or cyanosis. No edema.  Distal pedal pulses are 2+ and equal bilaterally.  Neuro: Alert and oriented X 3. Moves all extremities spontaneously.  Psych:  Responds to questions appropriately with a normal affect.  ECG:   Assessment / Plan:   1. Left bundle branch block-  S/p Bi-V pacer ,  Doing well.  2. Diabetes mellitus 3. Hyperlipidemia -  He's been on atorvastatin 40 mg a day for some time. He was talking with family member and she discussed the fact that sometimes statins could increase glucose levels. He stopped his simvastatin and indeed his glucose levels decreased some. Is lipids went up significant.  He's now back on the atorvastatin. Has a couple months we will decrease his atorvastatin to 20 mg a day. His last LDL was 35 so we do not necessarily need him on 40 mg a day. We'll check fasting lipids, liver enzymes, and basic medical  profile in 3 months.  4. Hypertension - BP is well controlled.   5. Congestive heart failure-    His EF had improved from 30% to 55% by echo in 2014.       He is doing great.  Out doing yard work regularly .  6. Bi-V ICD placement -   Followed by Dr. Rayann Heman.  Doing great.     Mertie Moores, MD  02/13/2016 11:33 AM    [address],  [place], [place]  [address] Pager [phone] Phone: [phone]; Fax: [phone]

## 2016-02-16 ENCOUNTER — Encounter: Payer: Self-pay | Admitting: Pulmonary Disease

## 2016-02-16 ENCOUNTER — Ambulatory Visit (INDEPENDENT_AMBULATORY_CARE_PROVIDER_SITE_OTHER)
Admission: RE | Admit: 2016-02-16 | Discharge: 2016-02-16 | Disposition: A | Discharge status: Home / Self Care | Payer: Medicare Other | Source: Ambulatory Visit | Attending: Pulmonary Disease | Admitting: Pulmonary Disease

## 2016-02-16 ENCOUNTER — Ambulatory Visit (INDEPENDENT_AMBULATORY_CARE_PROVIDER_SITE_OTHER): Payer: Medicare Other | Admitting: Pulmonary Disease

## 2016-02-16 VITALS — BP 120/70 | HR 62 | Temp 97.0°F | Ht 68.0 in | Wt 190.6 lb

## 2016-02-16 DIAGNOSIS — I110 Hypertensive heart disease with heart failure: Secondary | ICD-10-CM | POA: Diagnosis not present

## 2016-02-16 DIAGNOSIS — I5022 Chronic systolic (congestive) heart failure: Secondary | ICD-10-CM | POA: Diagnosis not present

## 2016-02-16 DIAGNOSIS — R911 Solitary pulmonary nodule: Secondary | ICD-10-CM | POA: Diagnosis not present

## 2016-02-16 DIAGNOSIS — J84112 Idiopathic pulmonary fibrosis: Secondary | ICD-10-CM

## 2016-02-16 DIAGNOSIS — Z95810 Presence of automatic (implantable) cardiac defibrillator: Secondary | ICD-10-CM

## 2016-02-16 DIAGNOSIS — K2270 Barrett's esophagus without dysplasia: Secondary | ICD-10-CM

## 2016-02-16 DIAGNOSIS — I10 Essential (primary) hypertension: Secondary | ICD-10-CM | POA: Diagnosis not present

## 2016-02-16 DIAGNOSIS — K2100 Gastro-esophageal reflux disease with esophagitis, without bleeding: Secondary | ICD-10-CM

## 2016-02-16 DIAGNOSIS — I447 Left bundle-branch block, unspecified: Secondary | ICD-10-CM

## 2016-02-16 NOTE — Patient Instructions (Signed)
Today we updated your med list in our EPIC system...    Continue your current medications the same...  Today we rechecked your CXR...    We will contact you w/ the results when available...   Stay as active as poss & pace yourself...  You are up to date on your vaccinations...  Call for any questions...  Let's plan a follow up visit in 32mo, sooner if needed for problems.Marland KitchenMarland Kitchen

## 2016-02-16 NOTE — Progress Notes (Signed)
Subjective:     Patient ID: BOMANI OOMMEN, male   DOB: 02/23/1941, 75 y.o.   MRN: [id number]  HPI  75 y/o WM, referred by Dr. Cathlean Cower for increased SOB>> He is a friend of my pt [name]...             MrThrasher was prev evaluated in 2013-14 by MR & notes summarized below>>  IOV 06/20/2011- Referred for lung nodule and dyspnea:    75year old male. reports that he quit smoking about 23 years ago. His smoking use included Cigarettes. He has a 5 pack-year smoking history (1/2 pack per day x 10 years). He has never used smokeless tobacco. Body mass index is 28.15 kg/(m^2). Known DM with nocturia - started on insulin 06/20/2011, HBP, Arthritis, Barretts- seeing Dr Ardis Hughs    Repoirts hx of small lung nodule with serial CT fu at [facility] for 2 years at [facility] ending 5 years ago. Stable and dc from followup. Does not remember which side    Reports insidious onset of DOE x 1year. Progressive gradually. Improved by rest. Notices when he mows yard, walking briskly one flight of stairs notices dyspnea. Always improved by rest. Wife notices he is not able to keep up with her during walks outside the home. No associated weight gain, or admissions to hospitals, no new medical problems diagnosed since onset of symptoms. Denies associated chest pain, cough, fever, weight gain, hemoptysis, edema, orthopnea, paroxysmal nocturnal dyspnea, wheezing. Systolic function was moderately to severely reduced.     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 Has active GERD. Denies collagen vascular disease. No steel dust, metal dust or asbestos exposure. Denies amio intake. Denies chemo. Does not have pet birds or feathery pillows     ECHO - 05/31/11: ejection fraction was in the range of 30% to 35%. There is hypokinesis of the inferior and inferoseptal myocardium. Doppler parameters are consistent with abnormal left ventricular relaxation (grade 1 diastolic  dysfunction). Ventricular septum: These changes are consistent with a left bundle branch block. Mitral valve: Mildly calcified annulus. Mildly thickened leaflets. Pulmonary arteries: Systolic pressure was mildly increased. PA peak pressure: 83m Hg.     CT CHEST 05/31/2011 Findings: No enlarged axillary or supraclavicular lymph nodes. There is no enlarged mediastinal or hilar lymph nodes. No pericardial or pleural effusion identified. There is a patulous and fluid-filled thoracic esophagus. There is peripheral and basilar predominant interstitial reticulation. Bibasilar subpleural honeycombing is also identified. Mild bronchiectasis is present in the lung bases. Ground-glass attenuation nodular density is noted within the left upper lobe measuring 1.8 cm, image 29. Solid nodule within left lower lobe measures 6 mm. Review of the visualized osseous structures is unremarkable. Limited imaging through the upper abdomen is significant for a left renal cyst. Only partially visualized and incompletely characterized without IV contrast. IMPRESSION: 1. 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 2. Ground-glass attenuating nodule within the left upper lobe is nonspecific and may be related to interstitial lung disease. Low grade pulmonary adenocarcinoma may have a similar appearance. Follow-up imaging in 3 months is recommended to ensure stability.      PFT 06/06/11:Signs of restrictions c/w the ILD we see on CT present even though TLC 5L/81% is normal. This is because FVC 2.9L/69% and reduced (fev1 is 2.45L/86% and normal). ratip is 84 and normal. DLCO is reduced 11/50%  #lung nodule  - please sign [facility]  [facility] CT [facility]  - likely not cancer but if we cannot get [facility] records we have to follow this iwht CT in 9 months  #Pulmonary fibrosis  - do not know why but acid reflux could have played a role  - please Serum: ESR, ANA, DS-DNA, RF,  anti-CCP, ssA, ssB, scl-70, ANCA, Total CK, Aldolase, Hypersensitivity Pneumonitis Panel, ACE level  - will call you with results to decide next step  #Shortness of breath  - both due to heart and lung issues  - between the two the heart is more important priority - please see cardiology ASAP  - our Nurse will ensure you have an appointment next few days    OV 07/19/2011:    Review dyspnea related to ILD NOS and new chronic systolic CHF. Since last visit has seen cardiology Dr Acie Fredrickson. States that with cards med mgmt: dyspnea improved a "little bit". Able to do more. Can now climb 2 flights of stairs non-stop (last visit only 1 flight of stairs). Saw Dr Acie Fredrickson yesterday 07/18/11: - NICM suspected- 07/11/11: Abnormal stress nuclear study: Dilated left ventricle with global hypokinesis. Suspect small mild area of reversible ischemia in mid anterior wall. The degree of severe LV dysfunction is out of proportion to the mild ischemia noted. LV Ejection Fraction: 29%. LV Wall Motion: Severe global hypokinesis. Meds adjusted. Cath on hold. Plan to place pacer if no improvement in EF with med mgmt. In temrs of workup for pulmonary fibrosis:     LABS 06/20/11 auto-immune test result: all negative including HP pa  nel except anca is trace positive at 1:20 which is the cut off between negative and positive. ESR 29 #lung nodule  - there are 2 spots in lung and there is possibiloty this could be lung cancer  - we absolultey need the scan fom [facility]; not just report but the CD rom of CT chest  - see if you can get it to [place] next 2 weeks if not repeat ct chest by Aug 28, 2011 (3rd months CT)  #Pulmonary fibrosis  - do not know why but acid reflux could have played a role  - at this point we will follow this - you need breathing test in 6 months or so .  - we will also use the CT chest results for the nodule to follow the pulmonary fibrosis  #Shortness of breath  - both due to heart and lung  issues  - between the two the heart is more important priority - Follow Dr Acie Fredrickson advise  - I will check with your cardiologist about suitability to attend pulmonary rehab [facility]    OV 09/10/2011    Followup pulmonary nodule, ILD non-UIP pattern NOS and dyspnea due to ILD and Systolic CHF: Last visit was 2 months ago. In terms of nodule, we have been unable to get the actual cd rom from [facility]. So, he will need fu testing here. In terms of pulm fibrosis, Bx Plans placed on hold due to cardiac status at time of last ov. I learn from 08/01/11 Dr Acie Fredrickson cards notes that plan is for continued medical mgmt. His dyspnea is improved a lot due to great med mgmt and bnp 80s as of 08/01/11. He is able to mow yard now. He thinks dietary portion control and resultant weight loss has also helped with dyspnea. #lung nodule  - there are 2 spots in lung and there is possibiloty this could be lung cancer  -  because we could not get scan from [facility], please do CT chest wihtout contrast now  #Pulmonary fibrosis  - do not know why but acid reflux could have played a role  - repeat anca blood test because this was trace positive and will help [place] know if this played a role  - depending on findings on CT chest, might have to consider lung biopsy  - you need breathing test in 6 months or so .  #Shortness of breath  - both due to heart and lung issues; glad you are better  - between the two- the heart is more important priority- Follow Dr Acie Fredrickson advise  - Dr Acie Fredrickson gave okay and will refer you to pulmonary rehab  #Overweight  - follow [facility] Diet sheet I discussed with you; stick to foods in left lane    Phne call 09/13/11:    ANCA blood test repeat result negative    CT scan: - lung nodule report - nodule smaller than before; & scarring or fibrosis in lung - stable as before.   OV 01/09/2012    Dyspnea: Did 12 visits with rehab that helped immensely with dyspnea. Then $ issues. Walking at home 51mles  non-stop at home with wife with o2 holding. PFT 01/09/12 - fvc 3.11/74%, fev1 2.6L/93% ratipo 84, TLC 73%, DLCO 11/53%. Of note, the FVC is significantly better than feb 2013 (? Weight loss effect) but dlco is same. In terms of weight: lost 9# on [facility] low glycemic diet (6# since [facility] 2013) . Tolerating diet well. No headaches. No hunger pangs. GERD resolved. Soft stools ++. Overall this makes him feel better despite fact echo 12/19/11 is unchangd with LOW Systolic EF 316%and elevated PASP 327m Hg -- EF conitnues to be low and he has been referred for pacemaker. #weight  - continue low glycemic diet  - as you continue to lose weight, talk to pcp JaCathlean CowerMD about slowly coming off diabetic drugs  #lung nodule  - next ct chest is May 2014  #Lung fibrosis and shortness of breath  - glad shortness of breath better  - contnue exercise and diet  - glad you had flu shot  - will hold off lung biopsy conversation due to risk of bx with EF 35%; atleast till EP eval complete or there is evidence of progression on PFT    OV 09/17/2012:      Had pacer October 2013: and since then dyspnea significantly better/ Also had ECHO 07/28/12: shows EF improved to 55% and shows some LVH. After above, not having dyspnea for yard work. Able to do all yard work without sitting. Before was sitting 2-3 times. Currently no dyspnea or cough. Feels good.    PFT 01/09/12 - fvc 3.11/74%, fev1 2.6L/93% ratipo 84, TLC 73%, DLCO 11/53%    PFT 09/16/2012. FVC 2.9 L/ 70%. FEV1 2.5 L/93%. Ratio is 86/118%. Total lung capacity is 4.4 L/70%. DLCO is reduced at 40.7/54%. Essentially shows restriction with low diffusion    EKG Jan 2014: Qtc > 500 msec    CT chest 08/26/2012: My impression is UIP> IMPRESSION: No change in subpleural reticular and cystic changes as above, which may be seen with usual interstitial pneumonitis, idiopathic pulmonary fibrosis, or hypersensitivity pneumonitis.  Stable 6 mm left lower lobe pulmonary  nodule, no persistent nodularity to the previously seen subpleural area of left upper lobe ground-glass airspace opacity.     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 Age > [age]ith subpleural, bibasal  densities and in my opinion basal honey combing as well. Autoimmune is negative. Dyspnea resolved/improved significantly after pacer. I explained the disease and diagnosis to him. Explained, that high odds of IPF and diseaes is progressive, and ultimatly fatal and could be debilitating. Recommended that we screen him for pirfenidone via [employer] He is in agreement. He seems to meet criteria for the EAP program except Jan 2014 EKG showed long Qtc. Patient is interested.    ==>Ultimately he was not a candidate for Pirfenidone due to his cardiac problems.        ~  November 30, 2014:  Pulmonary consult w/ SN>        MrThrasher was recently [facility] 7/27- 11/18/14 by [facility] due to severe anemia; he states he developed recurrent SOB in mid-July and eval by PCP showed Hg=6; prior to this he was mowing his yard & stable; he denied any bleeding or change in stools; hosp eval revealed low MCV, Fe=10 (2%sat), Ferritin=4 (this suggests long slow ooze), B12=174, Folate=32; stool check in hosp was said to be NEG for blood...  He was supposed to be on Iron supplement but had stopped this about 85yrbefore;  He is followed for GI by DrJacobs- had EGD 09/06/14 showing 3-4cmHH & Barrett's epithelium (Bx= no dysplasia or malignancy); he has been taking Prilosec20Bid regularly;  Last colonoscopy was 07/18/11 showing 2 sessile polyps- one hyperplastic, one tub adenoma & f/u planned 5756yr.. He was disch on FeSO4 Tid => Breathing is OK at rest, DOE w/ walking/ mowing=> now improved since transfusion; he denies cough, sputum/ hemoptysis; he denies CP/ palpit/ edema;  Also needs oral B12 supplement daily w/ f/u B12 level per protocol...       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      Current Meds> ASA81, Coreg25Bid, Lasix40, Atorva40, Metform850Tid, Actos45, Lantus44, Omep20Bid... EXAM reveals Afeb, VSS, O2sat=93% on RA at rest;  HEENT- neg, mallampati2;  Chest- bibasilar rales ~1/2 way up, no wheezing or rhonchi;  Heart- RR gr 1/6 SEM w/o r/g;  Abd soft, neg;  Ext- neg w/o c/c/e...  CXR 11/17/14 showed norm heart size, AICD on left, bibasilar fibrosis vs atx sl incr from prev...  Spirometry 11/30/14 showed FVC=2.24 (54%), FEV1=1.97 (62%), %1sec=88, mid-flows are wnl at 125% predicted... (Vol & flow are both reduced from prev).  Ambulatory oxygen saturation test 11/30/14> O2sat=94% on RA at rest w/ pulse=60/min; he ambulated 2 laps & stopped w/ dyspnea; lowest O2sat=87% w/ pulse of 80/min...   CT Chest 12/01/14 showed LUL ground-glass nodule is less prominent, LLL 25m87module is unchanged, chronic ILD which appears slightly progressed in the periphery of both lungs; heart size is normal, extensive coronary artery calcification & AICD in place...  LABS 11/30/14 showed Hg=9.9 I reviewed extensive old records [facility], [facility], CT scans, PFTs, and Labs> and discussed w/ pt & wife face-to-face during our 78m59mppt... IMP/PLAN>>  Jonhatan's recent incr in SOB was clearly due to his severe anemia which in turn was likely related to a slow ooze from his HH/ Barrett's mucosa and the fact that he stopped his oral iron therapy ~56yr 656yr  He is feeling sl better after the 2uTx & oral iron Rx  w/ Hg up tp ~10 now;  He is asked to stay on the Fe Bid & B12 1082mh/d... Having noted this & the recent Cards f/u indicating stable status- his 234yr/u CT Chest and spirometry does indicate some progression of his ILD... I am in favor of considering some treatment for his pulm  fibrosis- he was deemed not a candidate for pirfenedone due to his cardiac problems & AICD;  I outlined 3 options to the pt & his wife>  1) Conservative approach- continue observation to see how his dypnea improves w/ Fe & an exercise program;  2) Middle-of-the-road approach- perhaps considering an ISC like Pulmicort;  3) Aggressive approach- revisit eligibility for Pirfenedone vs trial of oral Pred... They clearly lean in the conservative direction & DaCasimiroes not want additional meds at this time; he wants to wait until his Hg has ret to normal & to see how his exercise program progresses... We plan ROV in 3 mo w/ CXR.  ~  March 02, 2015:  62m662moV w/ f/u CXR> as outlined above DavNitishs been taking the FeSO4 Tid and B12 supplement regularly; he says he feels considerably better & claims his breathing is improved, overall doing well- notes DOE w/ exertion (eg- digging a bif hole to transplant schrubs), states he mows ok, walks ok, stairs ok, etc; he has O2 concentrator at home & hasn't needed to use it he says- it's a $23 copay per month & he wants to get rid of it if poss;(we discussed checking an ONO first)... He saw CAR[facility]- DrNahser 02/17/15 for f/u HBP, SysCHF w/ EF=30-35%, LBBB, Bi-V ICD in place, HL, DM> Myoview had EF=29%, global LV dil, no ischemia; meds adjusted- on Coreg25Bid, Lasix40, ASA81; f/u LVF normalized on the Bi-V pacer=> this is checked by DrAllred for EP...     EXAM reveals Afeb, VSS, O2sat=94% on RA at rest;  HEENT- neg, mallampati2;  Chest- bibasilar rales ~1/2 way up, no wheezing or rhonchi;  Heart- RR gr 1/6 SEM w/o r/g;  Abd soft, neg;  Ext- neg w/o c/c/e...  CXR 03/02/15>  Mild cardiomeg, pacer stable, prominent interstitial markings/fibrosis- similar to prior, no change...  LABS 03/02/15>  CBC- improved w/ Hg=15, Fe=262 (62%sat), Ferritin=16;  B12 >1500... REC to decr Fe to one daily & decr B12 to 1/2 tab daily...  IMP/PLAN>>  DavRoniel improved, asked to decr the Fe & B12 as  above; we will check ONO to assess need for continued Home oxygen; ROV planned 13mo64mo  ~  October 18, 2015:  7-18mo 55mo& I note that DavidMeerr had the ONO done as requested after his last visit; His PCP- DrJohn had [facility] pick it up & pt feels that he is doing satis w/o the oxygen- denies cough, sput, SOB, CP, edema, etc; he notes no deterioration in his breathing- DOE w/ exertion as before; NOTE> he has a pulse-ox monitor & says that his O2 drops w/ exercise, getting as low as 85% on RA w/ mod exertion, "but it returns to normal quickly after rest;  He says 94-95% sat at rest on RA, and 88-89% w/ mowing/ walking;  He again denies CP/ palpit/ dizzy, edema & followed by drNahser & DrAllred as noted (they have asked him to lim his sodium intake...    EXAM reveals Afeb, VSS, O2sat=94% on RA at rest;  HEENT- neg, mallampati2;  Chest- bibasilar rales ~1/2 way up, no wheezing or rhonchi;  Heart- RR gr 1/6 SEM w/o r/g;  Abd soft, neg;  Ext- neg w/o c/c/e...  Ambulatory Oximetry 10/18/15>  O2sat=94% on RA at rest w/ pulse=66/min;  He ambulated 2 laps in office (185' ea) w/ lowest O2sat=86% w/ pulse=83/min...   Overnight Oximetry> ordered again, we were never notified of the results, finally contacted [facility] & told he desaturated to <88% on RA for 175mn of the overnight study=> restart O2 at 2L/min Qhs... IMP/PLAN>>  Pt is resistent to the idea of home oxygen but his studies indicate need for NOCTURNAL O2 at 2L/min AND ambulatory O2 w/ exertion; he will capitulate to the former but declines O2 w/ exercise believing that he is stable & doing satis w/ quick recovery after exercise...     Past Medical History:  Diagnosis Date  . Abnormal ANCA test 07/03/2011  . Allergy   . Arthritis    "mild; right shoulder" (01/29/2012)  . B12 deficiency anemia   . Barrett's esophagus 11/23/2010  . Bundle branch block, left   . Cataract    L eye surgery  . CHF (congestive heart failure) ([place]    pacemaker  . Chronic  systolic dysfunction of left ventricle    EF 30%  . Colon polyps 11/23/2010  . GERD (gastroesophageal reflux disease)   . Hyperlipidemia 10/14/2012  . Hypertension   . ICD (implantable cardiac defibrillator) in place   . Iron deficiency anemia 05/30/2011  . Liver abscess 1980's  . Nonischemic cardiomyopathy ([place]    s/p [employer] Jude BiV ICD 01/29/12  . Pulmonary fibrosis ([place]   . Pulmonary nodules   . Shortness of breath    "related to heart being out of rhythm" (01/29/2012)  . Type II diabetes mellitus ([place]     Past Surgical History:  Procedure Laterality Date  . abscess drained  ?1980's   liver  . BI-VENTRICULAR IMPLANTABLE CARDIOVERTER DEFIBRILLATOR N/A 01/29/2012   Procedure: BI-VENTRICULAR IMPLANTABLE CARDIOVERTER DEFIBRILLATOR  (CRT-D);  Surgeon: JThompson Grayer MD;  [facility];  Service: Cardiovascular;  Laterality: N/A;  . CARDIAC DEFIBRILLATOR PLACEMENT  01/29/2012   SJM Quadra Assura BiV ICD implanted by Dr ARayann Heman . CATARACT EXTRACTION Left   . COLONOSCOPY    . TONSILLECTOMY  1952  . UPPER GI ENDOSCOPY      Outpatient Encounter Prescriptions as of 10/18/2015  Medication Sig  . aspirin EC 81 MG tablet Take 81 mg by mouth daily.  . Calcium-Magnesium-Zinc 1000-400-15 MG TABS Take 1 tablet by mouth daily.   . Cholecalciferol (VITAMIN D) 1000 UNITS capsule Take 1,000 Units by mouth 3 (three) times daily.   . Cyanocobalamin (B-12 PO) Take 1 tablet by mouth 3 (three) times daily.   . furosemide (LASIX) 40 MG tablet TAKE ONE TABLET BY MOUTH ONCE DAILY  . ibuprofen (ADVIL,MOTRIN) 200 MG tablet Take 200-300 mg by mouth 2 (two) times daily as needed. For pain  . Lutein 20 MG CAPS Take 20 mg by mouth daily.  . Multiple Vitamin (MULTIVITAMIN WITH MINERALS) TABS Take 1 tablet by mouth daily.  . OMEGA 3 1000 MG CAPS Take 1,000 mg by mouth 2 (two) times daily.   . [DISCONTINUED] atorvastatin (LIPITOR) 40 MG tablet TAKE ONE TABLET BY MOUTH ONCE DAILY  . [DISCONTINUED] BD PEN NEEDLE  NANO U/F 32G X 4 MM MISC USE ONE PEN NEEDLE TO INJECT INSULIN SUBCUTANEOUSLY EVERY DAY  . [DISCONTINUED] carvedilol (COREG) 25 MG tablet Take 1 tablet (25 mg total) by mouth 2 (two) times daily with a meal.  . [DISCONTINUED] ferrous sulfate 325 (65  FE) MG tablet Take 1 tablet (325 mg total) by mouth 3 (three) times daily with meals.  . [DISCONTINUED] Insulin Glargine (LANTUS SOLOSTAR) 100 UNIT/ML Solostar Pen 40 units sq per day  . [DISCONTINUED] metFORMIN (GLUCOPHAGE) 850 MG tablet TAKE ONE TABLET BY MOUTH THREE TIMES DAILY  . [DISCONTINUED] methocarbamol (ROBAXIN) 500 MG tablet Take 500 mg by mouth daily as needed for muscle spasms.  . [DISCONTINUED] methocarbamol (ROBAXIN) 500 MG tablet TAKE ONE TABLET BY MOUTH ONCE DAILY AS NEEDED  . [DISCONTINUED] omeprazole (PRILOSEC) 20 MG capsule TAKE ONE CAPSULE BY MOUTH TWICE DAILY  . [DISCONTINUED] pioglitazone (ACTOS) 45 MG tablet TAKE ONE TABLET BY MOUTH AT BEDTIME   No facility-administered encounter medications on file as of 10/18/2015.     Allergies  Allergen Reactions  . Penicillins Anaphylaxis and Other (See Comments)    "swelling of eyes; throat; could breath good" (01/29/2012)  . Iodine Other (See Comments)    "if I get iodine dye, I'll get a fever" (01/29/2012)  . Fish Oil Itching    Pt said only when given through IV    Immunization History  Administered Date(s) Administered  . Influenza Split 01/03/2012  . Influenza Whole 05/03/2011  . Influenza,inj,Quad PF,36+ Mos 03/24/2013, 12/09/2013, 03/02/2015  . Pneumococcal Conjugate-13 10/21/2008, 04/14/2013  . Pneumococcal Polysaccharide-23 01/03/2012  . Tdap 11/23/2010  . Zoster 04/14/2013    Current Medications, Allergies, Past Medical History, Past Surgical History, Family History, and Social History were reviewed in [employer] record.   Review of Systems            All symptoms NEG except where BOLDED >>  Constitutional:  F/C/S, fatigue, anorexia,  unexpected weight change. HEENT:  HA, visual changes, hearing loss, earache, nasal symptoms, sore throat, mouth sores, hoarseness. Resp:  cough, sputum, hemoptysis; SOB, tightness, wheezing. Cardio:  CP, palpit, DOE, orthopnea, edema. GI:  N/V/D/C, blood in stool; reflux, abd pain, distention, gas. GU:  dysuria, freq, urgency, hematuria, flank pain, voiding difficulty. MS:  joint pain, swelling, tenderness, decr ROM; neck pain, back pain, etc. Neuro:  HA, tremors, seizures, dizziness, syncope, weakness, numbness, gait abn. Skin:  suspicious lesions or skin rash. Heme:  adenopathy, bruising, bleeding. Psyche:  confusion, agitation, sleep disturbance, hallucinations, anxiety, depression suicidal.   Objective:   Physical Exam      Vital Signs:  Reviewed...   General:  WD, WN, 75 y/o WM in NAD; alert & oriented; pleasant & cooperative... HEENT:  [place]/AT; Conjunctiva- pink, Sclera- nonicteric, EOM-wnl, PERRLA, EACs-clear, TMs-wnl; NOSE-clear; THROAT-clear & wnl.  Neck:  Supple w/ fair ROM; no JVD; normal carotid impulses w/o bruits; no thyromegaly or nodules palpated; no lymphadenopathy.  Chest:  decr BS bilat w/ velcro rales bilat ~1/2 way up; no wheezing or rhonchi, no signs of consolidation... Heart:  AICD on left, Regular Rhythm; gr 1/6 SEM w/o rubs or gallops detected. Abdomen:  Soft & nontender- no guarding or rebound; normal bowel sounds; no organomegaly or masses palpated Ext:  decrROM; without deformities +arthritic changes; no varicose veins, venous insuffic, or edema;  Pulses intact w/o bruits. Neuro:  No focal neuro deficits; sensory testing normal; gait normal & balance OK. Derm:  No lesions noted; no rash etc. Lymph:  No cervical, supraclavicular, axillary, or inguinal adenopathy palpated.   Assessment:      IMP >>     Multifactorial dyspnea -- fibrosis, CHF, anemia, deconditioning all playing a roll     Pulmonary fibrosis -- sl progressive per 48yrCT follow up (  see below)     Pulmonary nodules -- stable on recent CT Chest    NICM w/ chr sys CHF, LBBB -- per DrNasher, stable    AICD placed -- stable    Anemia- Iron defic & low B12 -- likely slow ooze from HH/ Barrett's & absorption issues -- rec FeSO4 Bid and B12 1012mg/d regularly, stay on PPI Bid as well...    Other medical problems as noted> HL, IDDM (watch Actos rx w/ CHF diagnosis)  PLAN >>  11/30/14>   Macari's recent incr in SOB was clearly due to his severe anemia which in turn was likely related to a slow ooze from his HH/ Barrett's mucosa and the fact that he stopped his oral iron therapy ~188yrgo;  He is feeling sl better after the 2uTx & oral iron Rx w/ Hg up tp ~10 now;  He is asked to stay on the Fe Bid & B12 100034md... 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 They clearly lean in the conservative direction & DaviMaddexs not want additional meds at this time; he wants to wait until his Hg has ret to normal & to see how his exercise program progresses... We plan ROV in 3 mo w/ CXR. 03/02/15>   DaviOrlieimproved, asked to decr the Fe & B12 as above; we will check ONO to assess need for continued Home oxygen; ROV planned [id number]/27/17>   Pt is resistent to the idea of home oxygen but his studies indicate need for NOCTURNAL O2 at 2L/min AND ambulatory O2 w/ exertion; he will capitulate to the former but declines O2 w/ exercise believing that he is stable &  doing satis w/ quick recovery after exercise...      Plan:     Patient's Medications  New Prescriptions   ATORVASTATIN (LIPITOR) 20 MG TABLET    Take 1 tablet (20 mg total) by mouth daily.  Previous Medications   ASPIRIN EC 81 MG TABLET    Take 81 mg by mouth daily.   CALCIUM-MAGNESIUM-ZINC 1000-400-15 MG TABS    Take 1 tablet by mouth daily.    CHOLECALCIFEROL (VITAMIN D) 1000 UNITS CAPSULE    Take 1,000 Units by mouth 3 (three) times daily.    CYANOCOBALAMIN (B-12 PO)    Take 1 tablet by mouth 3 (three) times daily.    FERROUS SULFATE 325 (65 FE) MG EC TABLET    Take 325 mg by mouth daily with breakfast.   FUROSEMIDE (LASIX) 40 MG TABLET    TAKE ONE TABLET BY MOUTH ONCE DAILY   IBUPROFEN (ADVIL,MOTRIN) 200 MG TABLET    Take 200-300 mg by mouth 2 (two) times daily as needed. For pain   LUTEIN 20 MG CAPS    Take 20 mg by mouth daily.   MULTIPLE VITAMIN (MULTIVITAMIN WITH MINERALS) TABS    Take 1 tablet by mouth daily.   OMEGA 3 1000 MG CAPS  Take 1,000 mg by mouth 2 (two) times daily.   Modified Medications   Modified Medication Previous Medication   BD PEN NEEDLE NANO U/F 32G X 4 MM MISC BD PEN NEEDLE NANO U/F 32G X 4 MM MISC      USE ONE  ONCE DAILY    USE ONE PEN NEEDLE TO INJECT INSULIN SUBCUTANEOUSLY EVERY DAY   CARVEDILOL (COREG) 25 MG TABLET carvedilol (COREG) 25 MG tablet      TAKE ONE TABLET BY MOUTH TWICE DAILY WITH MEALS    Take 1 tablet (25 mg total) by mouth 2 (two) times daily with a meal.   LANTUS SOLOSTAR 100 UNIT/ML SOLOSTAR PEN Insulin Glargine (LANTUS SOLOSTAR) 100 UNIT/ML Solostar Pen      INJECT 40 UNITS SUBCUTANEOUSLY ONCE DAILY    40 units sq per day   METFORMIN (GLUCOPHAGE) 850 MG TABLET metFORMIN (GLUCOPHAGE) 850 MG tablet      TAKE ONE TABLET BY MOUTH THREE TIMES DAILY    TAKE ONE TABLET BY MOUTH THREE TIMES DAILY   METHOCARBAMOL (ROBAXIN) 500 MG TABLET methocarbamol (ROBAXIN) 500 MG tablet      TAKE ONE TABLET BY MOUTH ONCE DAILY AS NEEDED    TAKE ONE TABLET  BY MOUTH ONCE DAILY AS NEEDED   OMEPRAZOLE (PRILOSEC) 20 MG CAPSULE omeprazole (PRILOSEC) 20 MG capsule      TAKE ONE CAPSULE BY MOUTH TWICE DAILY    TAKE ONE CAPSULE BY MOUTH TWICE DAILY   PIOGLITAZONE (ACTOS) 45 MG TABLET pioglitazone (ACTOS) 45 MG tablet      TAKE ONE TABLET BY MOUTH AT BEDTIME    TAKE ONE TABLET BY MOUTH AT BEDTIME  Discontinued Medications   ATORVASTATIN (LIPITOR) 40 MG TABLET    TAKE ONE TABLET BY MOUTH ONCE DAILY   FERROUS SULFATE 325 (65 FE) MG TABLET    Take 1 tablet (325 mg total) by mouth 3 (three) times daily with meals.   METHOCARBAMOL (ROBAXIN) 500 MG TABLET    Take 500 mg by mouth daily as needed for muscle spasms.   OMEPRAZOLE (PRILOSEC) 20 MG CAPSULE    TAKE ONE CAPSULE BY MOUTH TWICE DAILY   PIOGLITAZONE (ACTOS) 45 MG TABLET    TAKE ONE TABLET BY MOUTH AT BEDTIME

## 2016-02-16 NOTE — Progress Notes (Signed)
Subjective:     Patient ID: Terry Peck, male   DOB: 1940-08-22, 75 y.o.   MRN: [id number]  HPI  75 y/o WM, referred by Dr. Cathlean Cower for increased SOB>> He is a friend of my pt Terry Peck...             Terry Peck was prev evaluated in 2013-14 by MR & notes summarized below>>  IOV 06/20/2011- Referred for lung nodule and dyspnea:    75year old male. reports that he quit smoking about 23 years ago. His smoking use included Cigarettes. He has a 5 pack-year smoking history (1/2 pack per day x 10 years). He has never used smokeless tobacco. Body mass index is 28.15 kg/(m^2). Known DM with nocturia - started on insulin 06/20/2011, HBP, Arthritis, Barretts- seeing Dr Ardis Hughs    Repoirts hx of small lung nodule with serial CT fu at [facility] for 2 years at [facility] ending 5 years ago. Stable and dc from followup. Does not remember which side    Reports insidious onset of DOE x 1year. Progressive gradually. Improved by rest. Notices when he mows yard, walking briskly one flight of stairs notices dyspnea. Always improved by rest. Wife notices he is not able to keep up with her during walks outside the home. No associated weight gain, or admissions to hospitals, no new medical problems diagnosed since onset of symptoms. Denies associated chest pain, cough, fever, weight gain, hemoptysis, edema, orthopnea, paroxysmal nocturnal dyspnea, wheezing. Systolic function was moderately to severely reduced.     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 Has active GERD. Denies collagen vascular disease. No steel dust, metal dust or asbestos exposure. Denies amio intake. Denies chemo. Does not have pet birds or feathery pillows     ECHO - 05/31/11: ejection fraction was in the range of 30% to 35%. There is hypokinesis of the inferior and inferoseptal myocardium. Doppler parameters are consistent with abnormal left ventricular relaxation (grade 1 diastolic  dysfunction). Ventricular septum: These changes are consistent with a left bundle branch block. Mitral valve: Mildly calcified annulus. Mildly thickened leaflets. Pulmonary arteries: Systolic pressure was mildly increased. PA peak pressure: 102m Hg.     CT CHEST 05/31/2011 Findings: No enlarged axillary or supraclavicular lymph nodes. There is no enlarged mediastinal or hilar lymph nodes. No pericardial or pleural effusion identified. There is a patulous and fluid-filled thoracic esophagus. There is peripheral and basilar predominant interstitial reticulation. Bibasilar subpleural honeycombing is also identified. Mild bronchiectasis is present in the lung bases. Ground-glass attenuation nodular density is noted within the left upper lobe measuring 1.8 cm, image 29. Solid nodule within left lower lobe measures 6 mm. Review of the visualized osseous structures is unremarkable. Limited imaging through the upper abdomen is significant for a left renal cyst. Only partially visualized and incompletely characterized without IV contrast. IMPRESSION: 1. 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 2. Ground-glass attenuating nodule within the left upper lobe is nonspecific and may be related to interstitial lung disease. Low grade pulmonary adenocarcinoma may have a similar appearance. Follow-up imaging in 3 months is recommended to ensure stability.      PFT 06/06/11:Signs of restrictions c/w the ILD we see on CT present even though TLC 5L/81% is normal. This is because FVC 2.9L/69% and reduced (fev1 is 2.45L/86% and normal). ratip is 84 and normal. DLCO is reduced 11/50%  #lung nodule  - please sign [facility]  to send [facility] rom [facility] CT [facility]  - likely not cancer but if we cannot get [facility] records we have to follow this iwht CT in 9 months  #Pulmonary fibrosis  - do not know why but acid reflux could have played a role  - please Serum: ESR, ANA, DS-DNA, RF,  anti-CCP, ssA, ssB, scl-70, ANCA, Total CK, Aldolase, Hypersensitivity Pneumonitis Panel, ACE level  - will call you with results to decide next step  #Shortness of breath  - both due to heart and lung issues  - between the two the heart is more important priority - please see cardiology ASAP  - our Nurse will ensure you have an appointment next few days    OV 07/19/2011:    Review dyspnea related to ILD NOS and new chronic systolic CHF. Since last visit has seen cardiology Dr Acie Fredrickson. States that with cards med mgmt: dyspnea improved a "little bit". Able to do more. Can now climb 2 flights of stairs non-stop (last visit only 1 flight of stairs). Saw Dr Acie Fredrickson yesterday 07/18/11: - NICM suspected- 07/11/11: Abnormal stress nuclear study: Dilated left ventricle with global hypokinesis. Suspect small mild area of reversible ischemia in mid anterior wall. The degree of severe LV dysfunction is out of proportion to the mild ischemia noted. LV Ejection Fraction: 29%. LV Wall Motion: Severe global hypokinesis. Meds adjusted. Cath on hold. Plan to place pacer if no improvement in EF with med mgmt. In temrs of workup for pulmonary fibrosis:     LABS 06/20/11 auto-immune test result: all negative including HP pa  nel except anca is trace positive at 1:20 which is the cut off between negative and positive. ESR 29 #lung nodule  - there are 2 spots in lung and there is possibiloty this could be lung cancer  - we absolultey need the scan fom [facility]; not just report but the CD rom of CT chest  - see if you can get it to [place] next 2 weeks if not repeat ct chest by Aug 28, 2011 (3rd months CT)  #Pulmonary fibrosis  - do not know why but acid reflux could have played a role  - at this point we will follow this - you need breathing test in 6 months or so .  - we will also use the CT chest results for the nodule to follow the pulmonary fibrosis  #Shortness of breath  - both due to heart and lung  issues  - between the two the heart is more important priority - Follow Dr Acie Fredrickson advise  - I will check with your cardiologist about suitability to attend [facility] rehab [facility]    OV 09/10/2011    Followup pulmonary nodule, ILD non-UIP pattern NOS and dyspnea due to ILD and Systolic CHF: Last visit was 2 months ago. In terms of nodule, we have been unable to get the actual cd rom from [facility]. So, he will need fu testing here. In terms of pulm fibrosis, Bx Plans placed on hold due to cardiac status at time of last ov. I learn from 08/01/11 Dr Acie Fredrickson cards notes that plan is for continued medical mgmt. His dyspnea is improved a lot due to great med mgmt and bnp 80s as of 08/01/11. He is able to mow yard now. He thinks dietary portion control and resultant weight loss has also helped with dyspnea. #lung nodule  - there are 2 spots in lung and there is possibiloty this could be lung cancer  -  because we could not get scan from [facility], please do CT chest wihtout contrast now  #Pulmonary fibrosis  - do not know why but acid reflux could have played a role  - repeat anca blood test because this was trace positive and will help [place] know if this played a role  - depending on findings on CT chest, might have to consider lung biopsy  - you need breathing test in 6 months or so .  #Shortness of breath  - both due to heart and lung issues; glad you are better  - between the two- the heart is more important priority- Follow Dr Acie Fredrickson advise  - Dr Acie Fredrickson gave okay and will refer you to pulmonary rehab  #Overweight  - follow [facility] Diet sheet I discussed with you; stick to foods in left lane    Phne call 09/13/11:    ANCA blood test repeat result negative    CT scan: - lung nodule report - nodule smaller than before; & scarring or fibrosis in lung - stable as before.   OV 01/09/2012    Dyspnea: Did 12 visits with rehab that helped immensely with dyspnea. Then $ issues. Walking at home 51mles  non-stop at home with wife with o2 holding. PFT 01/09/12 - fvc 3.11/74%, fev1 2.6L/93% ratipo 84, TLC 73%, DLCO 11/53%. Of note, the FVC is significantly better than feb 2013 (? Weight loss effect) but dlco is same. In terms of weight: lost 9# on [facility] low glycemic diet (6# since MArch 2013) . Tolerating diet well. No headaches. No hunger pangs. GERD resolved. Soft stools ++. Overall this makes him feel better despite fact echo 12/19/11 is unchangd with LOW Systolic EF 316%and elevated PASP 327m Hg -- EF conitnues to be low and he has been referred for pacemaker. #weight  - continue low glycemic diet  - as you continue to lose weight, talk to pcp JaCathlean CowerMD about slowly coming off diabetic drugs  #lung nodule  - next ct chest is May 2014  #Lung fibrosis and shortness of breath  - glad shortness of breath better  - contnue exercise and diet  - glad you had flu shot  - will hold off lung biopsy conversation due to risk of bx with EF 35%; atleast till EP eval complete or there is evidence of progression on PFT    OV 09/17/2012:      Had pacer October 2013: and since then dyspnea significantly better/ Also had ECHO 07/28/12: shows EF improved to 55% and shows some LVH. After above, not having dyspnea for yard work. Able to do all yard work without sitting. Before was sitting 2-3 times. Currently no dyspnea or cough. Feels good.    PFT 01/09/12 - fvc 3.11/74%, fev1 2.6L/93% ratipo 84, TLC 73%, DLCO 11/53%    PFT 09/16/2012. FVC 2.9 L/ 70%. FEV1 2.5 L/93%. Ratio is 86/118%. Total lung capacity is 4.4 L/70%. DLCO is reduced at 40.7/54%. Essentially shows restriction with low diffusion    EKG Jan 2014: Qtc > 500 msec    CT chest 08/26/2012: My impression is UIP> IMPRESSION: No change in subpleural reticular and cystic changes as above, which may be seen with usual interstitial pneumonitis, idiopathic pulmonary fibrosis, or hypersensitivity pneumonitis.  Stable 6 mm left lower lobe pulmonary  nodule, no persistent nodularity to the previously seen subpleural area of left upper lobe ground-glass airspace opacity.     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 Age > [age]ith subpleural, bibasal  densities and in my opinion basal honey combing as well. Autoimmune is negative. Dyspnea resolved/improved significantly after pacer. I explained the disease and diagnosis to him. Explained, that high odds of IPF and diseaes is progressive, and ultimatly fatal and could be debilitating. Recommended that we screen him for pirfenidone via [employer] He is in agreement. He seems to meet criteria for the EAP program except Jan 2014 EKG showed long Qtc. Patient is interested.    ==>Ultimately he was not a candidate for Pirfenidone due to his cardiac problems.        ~  November 30, 2014:  Pulmonary consult w/ SN>        Terry Peck was recently [facility] 7/27- 11/18/14 by [facility] due to severe anemia; he states he developed recurrent SOB in mid-July and eval by PCP showed Hg=6; prior to this he was mowing his yard & stable; he denied any bleeding or change in stools; hosp eval revealed low MCV, Fe=10 (2%sat), Ferritin=4 (this suggests long slow ooze), B12=174, Folate=32; stool check in hosp was said to be NEG for blood...  He was supposed to be on Iron supplement but had stopped this about 85yrbefore;  He is followed for GI by DrJacobs- had EGD 09/06/14 showing 3-4cmHH & Barrett's epithelium (Bx= no dysplasia or malignancy); he has been taking Prilosec20Bid regularly;  Last colonoscopy was 07/18/11 showing 2 sessile polyps- one hyperplastic, one tub adenoma & f/u planned 5756yr.. He was disch on FeSO4 Tid => Breathing is OK at rest, DOE w/ walking/ mowing=> now improved since transfusion; he denies cough, sputum/ hemoptysis; he denies CP/ palpit/ edema;  Also needs oral B12 supplement daily w/ f/u B12 level per protocol...       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      Current Meds> ASA81, Coreg25Bid, Lasix40, Atorva40, Metform850Tid, Actos45, Lantus44, Omep20Bid... EXAM reveals Afeb, VSS, O2sat=93% on RA at rest;  HEENT- neg, mallampati2;  Chest- bibasilar rales ~1/2 way up, no wheezing or rhonchi;  Heart- RR gr 1/6 SEM w/o r/g;  Abd soft, neg;  Ext- neg w/o c/c/e...  CXR 11/17/14 showed norm heart size, AICD on left, bibasilar fibrosis vs atx sl incr from prev...  Spirometry 11/30/14 showed FVC=2.24 (54%), FEV1=1.97 (62%), %1sec=88, mid-flows are wnl at 125% predicted... (Vol & flow are both reduced from prev).  Ambulatory oxygen saturation test 11/30/14> O2sat=94% on RA at rest w/ pulse=60/min; he ambulated 2 laps & stopped w/ dyspnea; lowest O2sat=87% w/ pulse of 80/min...   CT Chest 12/01/14 showed LUL ground-glass nodule is less prominent, LLL 25m87module is unchanged, chronic ILD which appears slightly progressed in the periphery of both lungs; heart size is normal, extensive coronary artery calcification & AICD in place...  LABS 11/30/14 showed Hg=9.9 I reviewed extensive [facility] scans, PFTs, and Labs> and discussed w/ pt & wife face-to-face during our 78m59mppt... IMP/PLAN>>  Terry Peck's recent incr in SOB was clearly due to his severe anemia which in turn was likely related to a slow ooze from his HH/ Barrett's mucosa and the fact that he stopped his oral iron therapy ~56yr 656yr  He is feeling sl better after the 2uTx & oral iron Rx  w/ Hg up tp ~10 now;  He is asked to stay on the Fe Bid & B12 1026mh/d... Having noted this & the recent Cards f/u indicating stable status- his 230yr/u CT Chest and spirometry does indicate some progression of his ILD... I am in favor of considering some treatment for his pulm  fibrosis- he was deemed not a candidate for pirfenedone due to his cardiac problems & AICD;  I outlined 3 options to the pt & his wife>  1) Conservative approach- continue observation to see how his dypnea improves w/ Fe & an exercise program;  2) Middle-of-the-road approach- perhaps considering an ISC like Pulmicort;  3) Aggressive approach- revisit eligibility for Pirfenedone vs trial of oral Pred... They clearly lean in the conservative direction & Terry Peck not want additional meds at this time; he wants to wait until his Hg has ret to normal & to see how his exercise program progresses... We plan ROV in 3 mo w/ CXR.  ~  March 02, 2015:  32m62moV w/ f/u CXR> as outlined above Terry Peck been taking the FeSO4 Tid and B12 supplement regularly; he says he feels considerably better & claims his breathing is improved, overall doing well- notes DOE w/ exertion (eg- digging a bif hole to transplant schrubs), states he mows ok, walks ok, stairs ok, etc; he has O2 concentrator at home & hasn't needed to use it he says- it's a $23 copay per month & he wants to get rid of it if poss;(we discussed checking an ONO first)... He saw [facility]- DrNahser 02/17/15 for f/u HBP, SysCHF w/ EF=30-35%, LBBB, Bi-V ICD in place, HL, DM> Myoview had EF=29%, global LV dil, no ischemia; meds adjusted- on Coreg25Bid, Lasix40, ASA81; f/u LVF normalized on the Bi-V pacer=> this is checked by DrAllred for EP...     EXAM reveals Afeb, VSS, O2sat=94% on RA at rest;  HEENT- neg, mallampati2;  Chest- bibasilar rales ~1/2 way up, no wheezing or rhonchi;  Heart- RR gr 1/6 SEM w/o r/g;  Abd soft, neg;  Ext- neg w/o c/c/e...  CXR 03/02/15>  Mild cardiomeg, pacer stable, prominent interstitial markings/fibrosis- similar to prior, no change...  LABS 03/02/15>  CBC- improved w/ Hg=15, Fe=262 (62%sat), Ferritin=16;  B12 >1500... REC to decr Fe to one daily & decr B12 to 1/2 tab daily...  IMP/PLAN>>  Terry Peck improved, asked to decr the Fe & B12 as  above; we will check ONO to assess need for continued Home oxygen; ROV planned 51mo32mo    Past Medical History:  Diagnosis Date  . Abnormal ANCA test 07/03/2011  . Allergy   . Arthritis    "mild; right shoulder" (01/29/2012)  . B12 deficiency anemia   . Barrett's esophagus 11/23/2010  . Bundle branch block, left   . Cataract    L eye surgery  . CHF (congestive heart failure) (HCC)[place] pacemaker  . Chronic systolic dysfunction of left ventricle    EF 30%  . Colon polyps 11/23/2010  . GERD (gastroesophageal reflux disease)   . Hyperlipidemia 10/14/2012  . Hypertension   . ICD (implantable cardiac defibrillator) in place   . Iron deficiency anemia 05/30/2011  . Liver abscess 1980's  . Nonischemic cardiomyopathy (HCC)[place] s/p [employer] Jude BiV ICD 01/29/12  . Pulmonary fibrosis (HCC)[place]. Pulmonary nodules   . Shortness of breath    "related to heart being out of rhythm" (01/29/2012)  . Type II diabetes mellitus (HCC)[place]  Past Surgical History:  Procedure Laterality Date  . abscess drained  ?1980's   liver  . BI-VENTRICULAR IMPLANTABLE CARDIOVERTER DEFIBRILLATOR N/A 01/29/2012   Procedure: BI-VENTRICULAR IMPLANTABLE CARDIOVERTER DEFIBRILLATOR  (CRT-D);  Surgeon: Thompson Grayer, MD;  Location: [facility] CATH LAB;  Service: Cardiovascular;  Laterality: N/A;  . CARDIAC DEFIBRILLATOR PLACEMENT  01/29/2012   SJM Quadra Assura BiV ICD implanted by Dr Rayann Heman  . CATARACT EXTRACTION Left   . COLONOSCOPY    . TONSILLECTOMY  1952  . UPPER GI ENDOSCOPY      Outpatient Encounter Prescriptions as of 03/02/2015  Medication Sig  . aspirin EC 81 MG tablet Take 81 mg by mouth daily.  . Calcium-Magnesium-Zinc 1000-400-15 MG TABS Take 1 tablet by mouth daily.   . Cholecalciferol (VITAMIN D) 1000 UNITS capsule Take 1,000 Units by mouth 3 (three) times daily.   . Cyanocobalamin (B-12 PO) Take 1 tablet by mouth 3 (three) times daily.   . furosemide (LASIX) 40 MG tablet TAKE ONE TABLET BY MOUTH ONCE DAILY  . ibuprofen  (ADVIL,MOTRIN) 200 MG tablet Take 200-300 mg by mouth 2 (two) times daily as needed. For pain  . Lutein 20 MG CAPS Take 20 mg by mouth daily.  . Multiple Vitamin (MULTIVITAMIN WITH MINERALS) TABS Take 1 tablet by mouth daily.  . OMEGA 3 1000 MG CAPS Take 1,000 mg by mouth 2 (two) times daily.   . [DISCONTINUED] atorvastatin (LIPITOR) 40 MG tablet TAKE ONE TABLET BY MOUTH ONCE DAILY  . [DISCONTINUED] carvedilol (COREG) 25 MG tablet Take 1 tablet (25 mg total) by mouth 2 (two) times daily with a meal.  . [DISCONTINUED] ferrous sulfate 325 (65 FE) MG tablet Take 1 tablet (325 mg total) by mouth 3 (three) times daily with meals.  . [DISCONTINUED] Insulin Glargine (LANTUS SOLOSTAR) 100 UNIT/ML Solostar Pen 40 units sq per day  . [DISCONTINUED] metFORMIN (GLUCOPHAGE) 850 MG tablet TAKE ONE TABLET BY MOUTH THREE TIMES DAILY  . [DISCONTINUED] methocarbamol (ROBAXIN) 500 MG tablet Take 500 mg by mouth daily as needed for muscle spasms.  . [DISCONTINUED] omeprazole (PRILOSEC) 20 MG capsule TAKE ONE CAPSULE BY MOUTH TWICE DAILY  . [DISCONTINUED] pioglitazone (ACTOS) 45 MG tablet TAKE ONE TABLET BY MOUTH AT BEDTIME   No facility-administered encounter medications on file as of 03/02/2015.     Allergies  Allergen Reactions  . Penicillins Anaphylaxis and Other (See Comments)    "swelling of eyes; throat; could breath good" (01/29/2012)  . Iodine Other (See Comments)    "if I get iodine dye, I'll get a fever" (01/29/2012)  . Fish Oil Itching    Pt said only when given through IV    Immunization History  Administered Date(s) Administered  . Influenza Split 01/03/2012  . Influenza Whole 05/03/2011  . Influenza,inj,Quad PF,36+ Mos 03/24/2013, 12/09/2013, 03/02/2015  . Pneumococcal Conjugate-13 10/21/2008, 04/14/2013  . Pneumococcal Polysaccharide-23 01/03/2012  . Tdap 11/23/2010  . Zoster 04/14/2013    Family History  Problem Relation Age of Onset  . Heart disease Mother   . Colon cancer Neg Hx    . Esophageal cancer Neg Hx   . Stomach cancer Neg Hx     Social History   Social History  . Marital status: Married    Spouse name: N/A  . Number of children: 2  . Years of education: 16   Occupational History  . Shipping/Receiving Set designer Mathematics--Retired    Social History Main Topics  . Smoking status: Former Smoker  Packs/day: 0.50    Years: 10.00    Types: Cigarettes    Quit date: 04/23/1988  . Smokeless tobacco: Never Used  . Alcohol use No  . Drug use: No  . Sexual activity: Not Currently   Other Topics Concern  . Not on file   Social History Narrative   Lives in [place] with spouse.  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      Current Medications, Allergies, Past Medical History, Past Surgical History, Family History, and Social History were reviewed in [employer] record.   Review of Systems            All symptoms NEG except where BOLDED >>  Constitutional:  F/C/S, fatigue, anorexia, unexpected weight change. HEENT:  HA, visual changes, hearing loss, earache, nasal symptoms, sore throat, mouth sores, hoarseness. Resp:  cough, sputum, hemoptysis; SOB, tightness, wheezing. Cardio:  CP, palpit, DOE, orthopnea, edema. GI:  N/V/D/C, blood in stool; reflux, abd pain, distention, gas. GU:  dysuria, freq, urgency, hematuria, flank pain, voiding difficulty. MS:  joint pain, swelling, tenderness, decr ROM; neck pain, back pain, etc. Neuro:  HA, tremors, seizures, dizziness, syncope, weakness, numbness, gait abn. Skin:  suspicious lesions or skin rash. Heme:  adenopathy, bruising, bleeding. Psyche:  confusion, agitation, sleep disturbance, hallucinations, anxiety, depression suicidal.   Objective:   Physical Exam      Vital Signs:  Reviewed...   General:  WD, WN, 75 y/o WM in NAD; alert & oriented; pleasant & cooperative... HEENT:  Terry Peck; Conjunctiva- pink, Sclera- nonicteric,  EOM-wnl, PERRLA, EACs-clear, TMs-wnl; NOSE-clear; THROAT-clear & wnl.  Neck:  Supple w/ fair ROM; no JVD; normal carotid impulses w/o bruits; no thyromegaly or nodules palpated; no lymphadenopathy.  Chest:  decr BS bilat w/ velcro rales bilat ~1/2 way up; no wheezing or rhonchi, no signs of consolidation... Heart:  AICD on left, Regular Rhythm; gr 1/6 SEM w/o rubs or gallops detected. Abdomen:  Soft & nontender- no guarding or rebound; normal bowel sounds; no organomegaly or masses palpated Ext:  decrROM; without deformities +arthritic changes; no varicose veins, venous insuffic, or edema;  Pulses intact w/o bruits. Neuro:  No focal neuro deficits; sensory testing normal; gait normal & balance OK. Derm:  No lesions noted; no rash etc. Lymph:  No cervical, supraclavicular, axillary, or inguinal adenopathy palpated.   Assessment:      IMP >>     Multifactorial dyspnea -- fibrosis, CHF, anemia, deconditioning all playing a roll     Pulmonary fibrosis -- sl progressive per 66yrCT follow up (see below)    Pulmonary nodules -- stable on recent CT Chest    NICM w/ chr sys CHF, LBBB -- per DrNasher, stable    AICD placed -- stable    Anemia- Iron defic & low B12 -- likely slow ooze from HH/ Barrett's & absorption issues -- rec FeSO4 Bid and B12 1008m/d regularly, stay on PPI Bid as well...    Other medical problems as noted> HL, IDDM (watch Actos rx w/ CHF diagnosis)  PLAN >>  11/30/14>   Terry Peck recent incr in SOB was clearly due to his severe anemia which in turn was likely related to a slow ooze from his HH/ Barrett's mucosa and the fact that he stopped his oral iron therapy ~1y72yro;  He is feeling sl better after the 2uTx & oral iron Rx w/ Hg up tp ~10 now;  He is asked to stay on the Fe Bid &  B12 1072mh/d... Having noted this & the recent Cards f/u indicating stable status- his 265yr/u CT Chest and spirometry does indicate some progression of his ILD... I am in favor of considering some  treatment for his pulm fibrosis- he was deemed not a candidate for pirfenedone due to his cardiac problems & AICD;  I outlined 3 options to the pt & his wife>  1) Conservative approach- continue observation to see how his dypnea improves w/ Fe & an exercise program;  2) Middle-of-the-road approach- perhaps considering an ISC like Pulmicort;  3) Aggressive approach- revisit eligibility for Pirfenedone vs trial of oral Pred... They clearly lean in the conservative direction & [place] not want additional meds at this time; he wants to wait until his Hg has ret to normal & to see how his exercise program progresses... We plan ROV in 3 mo w/ CXR. 03/02/15>   Terry Peck improved, asked to decr the Fe & B12 as above; we will check ONO to assess need for continued Home oxygen; ROV planned 64m57mo      Plan:     Patient's Medications  New Prescriptions   ATORVASTATIN (LIPITOR) 20 MG TABLET    Take 1 tablet (20 mg total) by mouth daily.  Previous Medications   ASPIRIN EC 81 MG TABLET    Take 81 mg by mouth daily.   CALCIUM-MAGNESIUM-ZINC 1000-400-15 MG TABS    Take 1 tablet by mouth daily.    CHOLECALCIFEROL (VITAMIN D) 1000 UNITS CAPSULE    Take 1,000 Units by mouth 3 (three) times daily.    CYANOCOBALAMIN (B-12 PO)    Take 1 tablet by mouth 3 (three) times daily.    FERROUS SULFATE 325 (65 FE) MG EC TABLET    Take 325 mg by mouth daily with breakfast.   FUROSEMIDE (LASIX) 40 MG TABLET    TAKE ONE TABLET BY MOUTH ONCE DAILY   IBUPROFEN (ADVIL,MOTRIN) 200 MG TABLET    Take 200-300 mg by mouth 2 (two) times daily as needed. For pain   LUTEIN 20 MG CAPS    Take 20 mg by mouth daily.   MULTIPLE VITAMIN (MULTIVITAMIN WITH MINERALS) TABS    Take 1 tablet by mouth daily.   OMEGA 3 1000 MG CAPS    Take 1,000 mg by mouth 2 (two) times daily.   Modified Medications   Modified Medication Previous Medication   BD PEN NEEDLE NANO U/F 32G X 4 MM MISC BD PEN NEEDLE NANO U/F 32G X 4 MM MISC      USE ONE  ONCE DAILY     USE ONE PEN NEEDLE TO INJECT INSULIN SUBCUTANEOUSLY EVERY DAY   CARVEDILOL (COREG) 25 MG TABLET carvedilol (COREG) 25 MG tablet      TAKE ONE TABLET BY MOUTH TWICE DAILY WITH MEALS    Take 1 tablet (25 mg total) by mouth 2 (two) times daily with a meal.   LANTUS SOLOSTAR 100 UNIT/ML SOLOSTAR PEN Insulin Glargine (LANTUS SOLOSTAR) 100 UNIT/ML Solostar Pen      INJECT 40 UNITS SUBCUTANEOUSLY ONCE DAILY    40 units sq per day   METFORMIN (GLUCOPHAGE) 850 MG TABLET metFORMIN (GLUCOPHAGE) 850 MG tablet      TAKE ONE TABLET BY MOUTH THREE TIMES DAILY    TAKE ONE TABLET BY MOUTH THREE TIMES DAILY   METHOCARBAMOL (ROBAXIN) 500 MG TABLET methocarbamol (ROBAXIN) 500 MG tablet      TAKE ONE TABLET BY MOUTH ONCE DAILY AS NEEDED  TAKE ONE TABLET BY MOUTH ONCE DAILY AS NEEDED   OMEPRAZOLE (PRILOSEC) 20 MG CAPSULE omeprazole (PRILOSEC) 20 MG capsule      TAKE ONE CAPSULE BY MOUTH TWICE DAILY    TAKE ONE CAPSULE BY MOUTH TWICE DAILY   PIOGLITAZONE (ACTOS) 45 MG TABLET pioglitazone (ACTOS) 45 MG tablet      TAKE ONE TABLET BY MOUTH AT BEDTIME    TAKE ONE TABLET BY MOUTH AT BEDTIME  Discontinued Medications   ATORVASTATIN (LIPITOR) 40 MG TABLET    TAKE ONE TABLET BY MOUTH ONCE DAILY   FERROUS SULFATE 325 (65 FE) MG TABLET    Take 1 tablet (325 mg total) by mouth 3 (three) times daily with meals.   METFORMIN (GLUCOPHAGE) 850 MG TABLET    TAKE ONE TABLET BY MOUTH THREE TIMES DAILY   METHOCARBAMOL (ROBAXIN) 500 MG TABLET    Take 500 mg by mouth daily as needed for muscle spasms.   OMEPRAZOLE (PRILOSEC) 20 MG CAPSULE    TAKE ONE CAPSULE BY MOUTH TWICE DAILY   PIOGLITAZONE (ACTOS) 45 MG TABLET    TAKE ONE TABLET BY MOUTH AT BEDTIME

## 2016-02-16 NOTE — Progress Notes (Signed)
Subjective:     Patient ID: Terry Peck, male   DOB: 06-01-1940, 75 y.o.   MRN: [id number]  HPI 75 y/o WM, referred by Dr. Cathlean Cower for increased SOB>> He is a friend of my pt Terry Peck...             Terry Peck was prev evaluated in 2013-14 by MR & notes summarized below>>  IOV 06/20/2011- Referred for lung nodule and dyspnea:    75year old male. reports that he quit smoking about 23 years ago. His smoking use included Cigarettes. He has a 5 pack-year smoking history (1/2 pack per day x 10 years). He has never used smokeless tobacco. Body mass index is 28.15 kg/(m^2). Known DM with nocturia - started on insulin 06/20/2011, HBP, Arthritis, Barretts- seeing Dr Ardis Hughs    Repoirts hx of small lung nodule with serial CT fu at [facility] for 2 years at [facility] ending 5 years ago. Stable and dc from followup. Does not remember which side    Reports insidious onset of DOE x 1year. Progressive gradually. Improved by rest. Notices when he mows yard, walking briskly one flight of stairs notices dyspnea. Always improved by rest. Wife notices he is not able to keep up with her during walks outside the home. No associated weight gain, or admissions to hospitals, no new medical problems diagnosed since onset of symptoms. Denies associated chest pain, cough, fever, weight gain, hemoptysis, edema, orthopnea, paroxysmal nocturnal dyspnea, wheezing. Systolic function was moderately to severely reduced.     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 Has active GERD. Denies collagen vascular disease. No steel dust, metal dust or asbestos exposure. Denies amio intake. Denies chemo. Does not have pet birds or feathery pillows     ECHO - 05/31/11: ejection fraction was in the range of 30% to 35%. There is hypokinesis of the inferior and inferoseptal myocardium. Doppler parameters are consistent with abnormal left ventricular relaxation (grade 1 diastolic dysfunction).  Ventricular septum: These changes are consistent with a left bundle branch block. Mitral valve: Mildly calcified annulus. Mildly thickened leaflets. Pulmonary arteries: Systolic pressure was mildly increased. PA peak pressure: 44m Hg.     CT CHEST 05/31/2011 Findings: No enlarged axillary or supraclavicular lymph nodes. There is no enlarged mediastinal or hilar lymph nodes. No pericardial or pleural effusion identified. There is a patulous and fluid-filled thoracic esophagus. There is peripheral and basilar predominant interstitial reticulation. Bibasilar subpleural honeycombing is also identified. Mild bronchiectasis is present in the lung bases. Ground-glass attenuation nodular density is noted within the left upper lobe measuring 1.8 cm, image 29. Solid nodule within left lower lobe measures 6 mm. Review of the visualized osseous structures is unremarkable. Limited imaging through the upper abdomen is significant for a left renal cyst. Only partially visualized and incompletely characterized without IV contrast. IMPRESSION: 1. 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 2. Ground-glass attenuating nodule within the left upper lobe is nonspecific and may be related to interstitial lung disease. Low grade pulmonary adenocarcinoma may have a similar appearance. Follow-up imaging in 3 months is recommended to ensure stability.      PFT 06/06/11:Signs of restrictions c/w the ILD we see on CT present even though TLC 5L/81% is normal. This is because FVC 2.9L/69% and reduced (fev1 is 2.45L/86% and normal). ratip is 84 and normal. DLCO is reduced 11/50%  #lung nodule  - please sign release for [facility] to  send CD rom of CT chest  - likely not cancer but if we cannot get [facility] records we have to follow this iwht CT in 9 months  #Pulmonary fibrosis  - do not know why but acid reflux could have played a role  - please Serum: ESR, ANA, DS-DNA, RF, anti-CCP, ssA,  ssB, scl-70, ANCA, Total CK, Aldolase, Hypersensitivity Pneumonitis Panel, ACE level  - will call you with results to decide next step  #Shortness of breath  - both due to heart and lung issues  - between the two the heart is more important priority - please see cardiology ASAP  - our Nurse will ensure you have an appointment next few days    OV 07/19/2011:    Review dyspnea related to ILD NOS and new chronic systolic CHF. Since last visit has seen cardiology Dr Acie Fredrickson. States that with cards med mgmt: dyspnea improved a "little bit". Able to do more. Can now climb 2 flights of stairs non-stop (last visit only 1 flight of stairs). Saw Dr Acie Fredrickson yesterday 07/18/11: - NICM suspected- 07/11/11: Abnormal stress nuclear study: Dilated left ventricle with global hypokinesis. Suspect small mild area of reversible ischemia in mid anterior wall. The degree of severe LV dysfunction is out of proportion to the mild ischemia noted. LV Ejection Fraction: 29%. LV Wall Motion: Severe global hypokinesis. Meds adjusted. Cath on hold. Plan to place pacer if no improvement in EF with med mgmt. In temrs of workup for pulmonary fibrosis:     LABS 06/20/11 auto-immune test result: all negative including HP pa  nel except anca is trace positive at 1:20 which is the cut off between negative and positive. ESR 29 #lung nodule  - there are 2 spots in lung and there is possibiloty this could be lung cancer  - we absolultey need the scan fom [facility]; not just report but the CD rom of CT chest  - see if you can get it to [place] next 2 weeks if not repeat ct chest by Aug 28, 2011 (3rd months CT)  #Pulmonary fibrosis  - do not know why but acid reflux could have played a role  - at this point we will follow this - you need breathing test in 6 months or so .  - we will also use the CT chest results for the nodule to follow the pulmonary fibrosis  #Shortness of breath  - both due to heart and lung issues  - between  the two the heart is more important priority - Follow Dr Acie Fredrickson advise  - I will check with your cardiologist about suitability to attend pulmonary rehab at [facility]    OV 09/10/2011    Followup pulmonary nodule, ILD non-UIP pattern NOS and dyspnea due to ILD and Systolic CHF: Last visit was 2 months ago. In terms of nodule, we have been unable to get the actual cd rom from [facility]. So, he will need fu testing here. In terms of pulm fibrosis, Bx Plans placed on hold due to cardiac status at time of last ov. I learn from 08/01/11 Dr Acie Fredrickson cards notes that plan is for continued medical mgmt. His dyspnea is improved a lot due to great med mgmt and bnp 80s as of 08/01/11. He is able to mow yard now. He thinks dietary portion control and resultant weight loss has also helped with dyspnea. #lung nodule  - there are 2 spots in lung and there is possibiloty this could be lung cancer  -  because we could not get scan from [facility], please do CT chest wihtout contrast now  #Pulmonary fibrosis  - do not know why but acid reflux could have played a role  - repeat anca blood test because this was trace positive and will help [place] know if this played a role  - depending on findings on CT chest, might have to consider lung biopsy  - you need breathing test in 6 months or so .  #Shortness of breath  - both due to heart and lung issues; glad you are better  - between the two- the heart is more important priority- Follow Dr Acie Fredrickson advise  - Dr Acie Fredrickson gave okay and will refer you to pulmonary rehab  #Overweight  - follow [facility] Diet sheet I discussed with you; stick to foods in left lane    Phne call 09/13/11:    ANCA blood test repeat result negative    CT scan: - lung nodule report - nodule smaller than before; & scarring or fibrosis in lung - stable as before.   OV 01/09/2012    Dyspnea: Did 12 visits with rehab that helped immensely with dyspnea. Then $ issues. Walking at home 4mles non-stop at home with  wife with o2 holding. PFT 01/09/12 - fvc 3.11/74%, fev1 2.6L/93% ratipo 84, TLC 73%, DLCO 11/53%. Of note, the FVC is significantly better than feb 2013 (? Weight loss effect) but dlco is same. In terms of weight: lost 9# on [facility] low glycemic diet (6# since [facility] 2013) . Tolerating diet well. No headaches. No hunger pangs. GERD resolved. Soft stools ++. Overall this makes him feel better despite fact echo 12/19/11 is unchangd with LOW Systolic EF 356%and elevated PASP 377m Hg -- EF conitnues to be low and he has been referred for pacemaker. #weight  - continue low glycemic diet  - as you continue to lose weight, talk to Terry Peck Terry Peck about slowly coming off diabetic drugs  #lung nodule  - next ct chest is May 2014  #Lung fibrosis and shortness of breath  - glad shortness of breath better  - contnue exercise and diet  - glad you had flu shot  - will hold off lung biopsy conversation due to risk of bx with EF 35%; atleast till EP eval complete or there is evidence of progression on PFT    OV 09/17/2012:      Had pacer October 2013: and since then dyspnea significantly better/ Also had ECHO 07/28/12: shows EF improved to 55% and shows some LVH. After above, not having dyspnea for yard work. Able to do all yard work without sitting. Before was sitting 2-3 times. Currently no dyspnea or cough. Feels good.    PFT 01/09/12 - fvc 3.11/74%, fev1 2.6L/93% ratipo 84, TLC 73%, DLCO 11/53%    PFT 09/16/2012. FVC 2.9 L/ 70%. FEV1 2.5 L/93%. Ratio is 86/118%. Total lung capacity is 4.4 L/70%. DLCO is reduced at 40.7/54%. Essentially shows restriction with low diffusion    EKG Jan 2014: Qtc > 500 msec    CT chest 08/26/2012: My impression is UIP> IMPRESSION: No change in subpleural reticular and cystic changes as above, which may be seen with usual interstitial pneumonitis, idiopathic pulmonary fibrosis, or hypersensitivity pneumonitis.  Stable 6 mm left lower lobe pulmonary nodule, no persistent  nodularity to the previously seen subpleural area of left upper lobe ground-glass airspace opacity.     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 Age > [age]ith subpleural, bibasal  densities and in my opinion basal honey combing as well. Autoimmune is negative. Dyspnea resolved/improved significantly after pacer. I explained the disease and diagnosis to him. Explained, that high odds of IPF and diseaes is progressive, and ultimatly fatal and could be debilitating. Recommended that we screen him for pirfenidone via [employer]. He is in agreement. He seems to meet criteria for the EAP program except Jan 2014 EKG showed long Qtc. Patient is interested.    ==>Ultimately he was not a candidate for Pirfenidone due to his cardiac problems.        ~  November 30, 2014:  Pulmonary consult w/ SN>        Terry Peck was recently [facility] 7/27- 11/18/14 by [facility] due to severe anemia; he states he developed recurrent SOB in mid-July and eval by Terry Peck showed Hg=6; prior to this he was mowing his yard & stable; he denied any bleeding or change in stools; hosp eval revealed low MCV, Fe=10 (2%sat), Ferritin=4 (this suggests long slow ooze), B12=174, Folate=32; stool check in hosp was said to be NEG for blood...  He was supposed to be on Iron supplement but had stopped this about 87yr before;  He is followed for GI by Terry Peck- had EGD 09/06/14 showing 3-4cmHH & Barrett's epithelium (Bx= no dysplasia or malignancy); he has been taking Prilosec20Bid regularly;  Last colonoscopy was 07/18/11 showing 2 sessile polyps- one hyperplastic, one tub adenoma & f/u planned 5yrs... He was disch on FeSO4 Tid => Breathing is OK at rest, DOE w/ walking/ mowing=> now improved since transfusion; he denies cough, sputum/ hemoptysis; he denies CP/ palpit/ edema;  Also needs oral B12 supplement daily w/ f/u B12 level per protocol...       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      Current Meds> ASA81, Coreg25Bid, Lasix40, Atorva40, Metform850Tid, Actos45, Lantus44, Omep20Bid... EXAM reveals Afeb, VSS, O2sat=93% on RA at rest;  HEENT- neg, mallampati2;  Chest- bibasilar rales ~1/2 way up, no wheezing or rhonchi;  Heart- RR gr 1/6 SEM w/o r/g;  Abd soft, neg;  Ext- neg w/o c/c/e...  CXR 11/17/14 showed norm heart size, AICD on left, bibasilar fibrosis vs atx sl incr from prev...  Spirometry 11/30/14 showed FVC=2.24 (54%), FEV1=1.97 (62%), %1sec=88, mid-flows are wnl at 125% predicted... (Vol & flow are both reduced from prev).  Ambulatory oxygen saturation test 11/30/14> O2sat=94% on RA at rest w/ pulse=60/min; he ambulated 2 laps & stopped w/ dyspnea; lowest O2sat=87% w/ pulse of 80/min...   CT Chest 12/01/14 showed LUL ground-glass nodule is less prominent, LLL 68mm nodule is unchanged, chronic ILD which appears slightly progressed in the periphery of both lungs; heart size is normal, extensive coronary artery calcification & AICD in place...  LABS 11/30/14 showed Hg=9.9 I reviewed extensive old records from Terry Peck, Terry Peck, Hosp- including CXRs, CT scans, PFTs, and Labs> and discussed w/ pt & wife face-to-face during our appt... IMP/PLAN>>  Terry Peck's recent incr in SOB was clearly due to his severe anemia which in turn was likely related to a slow ooze from his HH/ Barrett's mucosa and the fact that he stopped his oral iron therapy ~62yr ago;  He is feeling sl better after the 2uTx & oral iron Rx  w/ Hg up tp ~10 now;  He is asked to stay on the Fe Bid & B12 10122mh/d... Having noted this & the recent Cards f/u indicating stable status- his 243yr/u CT Chest and spirometry does indicate some progression of his ILD... I am in favor of considering some treatment for his pulm fibrosis- he was deemed not a  candidate for pirfenedone due to his cardiac problems & AICD;  I outlined 3 options to the pt & his wife>  1) Conservative approach- continue observation to see how his dypnea improves w/ Fe & an exercise program;  2) Middle-of-the-road approach- perhaps considering an ISC like Pulmicort;  3) Aggressive approach- revisit eligibility for Pirfenedone vs trial of oral Pred... They clearly lean in the conservative direction & Terry Peck not want additional meds at this time; he wants to wait until his Hg has ret to normal & to see how his exercise program progresses... We plan ROV in 3 mo w/ CXR.  ~  March 02, 2015:  22m59moV w/ f/u CXR> as outlined above Terry Peck been taking the FeSO4 Tid and B12 supplement regularly; he says he feels considerably better & claims his breathing is improved, overall doing well- notes DOE w/ exertion (eg- digging a bif hole to transplant schrubs), states he mows ok, walks ok, stairs ok, etc; he has O2 concentrator at home & hasn't needed to use it he says- it's a $23 copay per month & he wants to get rid of it if poss;(we discussed checking an ONO first)... He saw CAR[facility]- Terry Peck 02/17/15 for f/u HBP, SysCHF w/ EF=30-35%, LBBB, Bi-V ICD in place, HL, DM> Myoview had EF=29%, global LV dil, no ischemia; meds adjusted- on Coreg25Bid, Lasix40, ASA81; f/u LVF normalized on the Bi-V pacer=> this is checked by Terry Peck for EP...     EXAM reveals Afeb, VSS, O2sat=94% on RA at rest;  HEENT- neg, mallampati2;  Chest- bibasilar rales ~1/2 way up, no wheezing or rhonchi;  Heart- RR gr 1/6 SEM w/o r/g;  Abd soft, neg;  Ext- neg w/o c/c/e...  CXR 03/02/15>  Mild cardiomeg, pacer stable, prominent interstitial markings/fibrosis- similar to prior, no change...  LABS 03/02/15>  CBC- improved w/ Hg=15, Fe=262 (62%sat), Ferritin=16;  B12 >1500... REC to decr Fe to one daily & decr B12 to 1/2 tab daily...  IMP/PLAN>>  Terry Peck improved, asked to decr the Fe & B12 as above; we will check ONO to assess  need for continued Home oxygen; ROV planned 69mo31mo  ~  October 18, 2015:  7-65mo 35mo& I note that Terry Peck had the ONO done as requested after his last visit; His Terry Peck- Terry Peck had [facility] pick it up & pt feels that he is doing satis w/o the oxygen- denies cough, sput, SOB, CP, edema, etc; he notes no deterioration in his breathing- DOE w/ exertion as before; NOTE> he has a pulse-ox monitor & says that his O2 drops w/ exercise, getting as low as 85% on RA w/ mod exertion, "but it returns to normal quickly after rest;  He says 94-95% sat at rest on RA, and 88-89% w/ mowing/ walking;  He again denies CP/ palpit/ dizzy, edema & followed by Terry Peck & Terry Peck as noted (they have asked him to lim his sodium intake...    EXAM reveals Afeb, VSS, O2sat=94% on RA at rest;  HEENT- neg, mallampati2;  Chest- bibasilar rales ~1/2 way up, no wheezing or rhonchi;  Heart- RR gr 1/6 SEM w/o r/g;  Abd soft, neg;  Ext- neg w/o c/c/e...  Ambulatory Oximetry 10/18/15>  O2sat=94% on RA at rest w/ pulse=66/min;  He ambulated 2 laps in office (185' ea) w/ lowest O2sat=86% w/ pulse=83/min...   Overnight Oximetry> ordered again, we were never notified of the results, finally contacted [facility] & told he desaturated to <88% on RA for 199mn of the overnight study=> restart O2 at 2L/min Qhs... IMP/PLAN>>  Pt is resistent to the idea of home oxygen but his studies indicate need for NOCTURNAL O2 at 2L/min AND ambulatory O2 w/ exertion; he will capitulate to the former but declines O2 w/ exercise believing that he is stable & doing satis w/ quick recovery after exercise...   ~  February 16, 2016:  41moOV & pulmonary follow up visit> Terry Peck and reports feeling well- no new complaints or concerns; he is using his O2 at 2L/min flow Qhs, but doesn't think he needs it during the day w/ usual activities; he says that his breathing is good, denies cough/ sput/ hemoptysis/ ch in DOE/ CP/ palpit/ dizzy/ ede4m10m.     He saw Terry Peck-Terry Peck  11/09/15> doing satis, Labs were ok w/ BS=133, but A1c=8.6 and his Lantus was increased; on Lantus40, Metform850Tid, Actos45    He had [facility] f/u Terry Peck 02/13/16> HBP, LBBB, SysCHF w/ EF=30-35% that normalized w/ Bi-V pacer, HL-- last 2DEcho 7/16 showed EF=50-55%; Pt stopped his Lip40 believing that this caused his Glucose to go up; he has since restarted Atorva20 & will check w/ Terry Peck for f/u FLP...     He saw EP clinic, Terry Peck & NP 02/02/16> reported doing well, norm ICD function confirmed,  EXAM reveals Afeb, VSS, O2sat=95% on RA at rest;  HEENT- neg, mallampati2;  Chest- bibasilar rales ~1/2 way up, no wheezing or rhonchi;  Heart- RR gr 1/6 SEM w/o r/g;  Abd soft, neg;  Ext- neg w/o c/c/e...  CXR 02/16/16>  Norm heart size, ICD well positioned on the left, bilat interstitial thickening is stable, unchanged, NAD... IMP/PLAN>>  Terry Peck stoic but stable, reports breathing satis w/o recent exac etc..recommended to continue same, stay active, rov in 64mo15moner if needed prn...    Past Medical History:  Diagnosis Date  . Abnormal ANCA test 07/03/2011  . Allergy   . Arthritis    "mild; right shoulder" (01/29/2012)  . B12 deficiency anemia   . Barrett's esophagus 11/23/2010  . Bundle branch block, left   . Cataract    L eye surgery  . CHF (congestive heart failure) (HCC)[employer] pacemaker  . Chronic systolic dysfunction of left ventricle    EF 30%  . Colon polyps 11/23/2010  . GERD (gastroesophageal reflux disease)   . Hyperlipidemia 10/14/2012  . Hypertension   . ICD (implantable cardiac defibrillator) in place   . Iron deficiency anemia 05/30/2011  . Liver abscess 1980's  . Nonischemic cardiomyopathy (HCC)[place] s/p [employer] BiV ICD 01/29/12  . Pulmonary fibrosis (HCC)[place]. Pulmonary nodules   . Shortness of breath    "related to heart being out of rhythm" (01/29/2012)  . Type II diabetes mellitus (HCC)[place]  Past Surgical History:  Procedure Laterality Date  . abscess drained  ?1980's    liver  . BI-VENTRICULAR IMPLANTABLE CARDIOVERTER DEFIBRILLATOR N/A 01/29/2012   Procedure: BI-VENTRICULAR IMPLANTABLE CARDIOVERTER DEFIBRILLATOR  (CRT-D);  Surgeon: JameThompson Grayer;  [facility];  Service: Cardiovascular;  Laterality: N/A;  . CARDIAC DEFIBRILLATOR PLACEMENT  01/29/2012  SJM [employer] Assura BiV ICD implanted by Dr Rayann Heman  . CATARACT EXTRACTION Left   . COLONOSCOPY    . TONSILLECTOMY  1952  . UPPER GI ENDOSCOPY      Outpatient Encounter Prescriptions as of 02/16/2016  Medication Sig  . aspirin EC 81 MG tablet Take 81 mg by mouth daily.  Marland Kitchen atorvastatin (LIPITOR) 20 MG tablet Take 1 tablet (20 mg total) by mouth daily.  . BD PEN NEEDLE NANO U/F 32G X 4 MM MISC USE ONE  ONCE DAILY  . Calcium-Magnesium-Zinc 1000-400-15 MG TABS Take 1 tablet by mouth daily.   . carvedilol (COREG) 25 MG tablet TAKE ONE TABLET BY MOUTH TWICE DAILY WITH MEALS  . Cholecalciferol (VITAMIN D) 1000 UNITS capsule Take 1,000 Units by mouth 3 (three) times daily.   . Cyanocobalamin (B-12 PO) Take 1 tablet by mouth 3 (three) times daily.   . ferrous sulfate 325 (65 FE) MG EC tablet Take 325 mg by mouth daily with breakfast.  . furosemide (LASIX) 40 MG tablet TAKE ONE TABLET BY MOUTH ONCE DAILY  . ibuprofen (ADVIL,MOTRIN) 200 MG tablet Take 200-300 mg by mouth 2 (two) times daily as needed. For pain  . LANTUS SOLOSTAR 100 UNIT/ML Solostar Pen INJECT 40 UNITS SUBCUTANEOUSLY ONCE DAILY  . Lutein 20 MG CAPS Take 20 mg by mouth daily.  . metFORMIN (GLUCOPHAGE) 850 MG tablet TAKE ONE TABLET BY MOUTH THREE TIMES DAILY  . methocarbamol (ROBAXIN) 500 MG tablet TAKE ONE TABLET BY MOUTH ONCE DAILY AS NEEDED  . Multiple Vitamin (MULTIVITAMIN WITH MINERALS) TABS Take 1 tablet by mouth daily.  . OMEGA 3 1000 MG CAPS Take 1,000 mg by mouth 2 (two) times daily.   Marland Kitchen omeprazole (PRILOSEC) 20 MG capsule TAKE ONE CAPSULE BY MOUTH TWICE DAILY  . pioglitazone (ACTOS) 45 MG tablet TAKE ONE TABLET BY MOUTH AT BEDTIME    No facility-administered encounter medications on file as of 02/16/2016.     Allergies  Allergen Reactions  . Penicillins Anaphylaxis and Other (See Comments)    "swelling of eyes; throat; could breath good" (01/29/2012)  . Iodine Other (See Comments)    "if I get iodine dye, I'll get a fever" (01/29/2012)  . Fish Oil Itching    Pt said only when given through IV    Immunization History  Administered Date(s) Administered  . Influenza Split 01/03/2012  . Influenza Whole 05/03/2011  . Influenza, High Dose Seasonal PF 01/17/2016  . Influenza,inj,Quad PF,36+ Mos 03/24/2013, 12/09/2013, 03/02/2015  . Pneumococcal Conjugate-13 10/21/2008, 04/14/2013  . Pneumococcal Polysaccharide-23 01/03/2012  . Tdap 11/23/2010  . Zoster 04/14/2013    Current Medications, Allergies, Past Medical History, Past Surgical History, Family History, and Social History were reviewed in [employer] record.   Review of Systems            All symptoms NEG except where BOLDED >>  Constitutional:  F/C/S, fatigue, anorexia, unexpected weight change. HEENT:  HA, visual changes, hearing loss, earache, nasal symptoms, sore throat, mouth sores, hoarseness. Resp:  cough, sputum, hemoptysis; SOB, tightness, wheezing. Cardio:  CP, palpit, DOE, orthopnea, edema. GI:  N/V/D/C, blood in stool; reflux, abd pain, distention, gas. GU:  dysuria, freq, urgency, hematuria, flank pain, voiding difficulty. MS:  joint pain, swelling, tenderness, decr ROM; neck pain, back pain, etc. Neuro:  HA, tremors, seizures, dizziness, syncope, weakness, numbness, gait abn. Skin:  suspicious lesions or skin rash. Heme:  adenopathy, bruising, bleeding. Psyche:  confusion, agitation, sleep disturbance, hallucinations, anxiety, depression  suicidal.   Objective:   Physical Exam      Vital Signs:  Reviewed...   General:  WD, WN, 75 y/o WM in NAD; alert & oriented; pleasant & cooperative... HEENT:  [place]/AT;  Conjunctiva- pink, Sclera- nonicteric, EOM-wnl, PERRLA, EACs-clear, TMs-wnl; NOSE-clear; THROAT-clear & wnl.  Neck:  Supple w/ fair ROM; no JVD; normal carotid impulses w/o bruits; no thyromegaly or nodules palpated; no lymphadenopathy.  Chest:  decr BS bilat w/ velcro rales bilat ~1/2 way up; no wheezing or rhonchi, no signs of consolidation... Heart:  AICD on left, Regular Rhythm; gr 1/6 SEM w/o rubs or gallops detected. Abdomen:  Soft & nontender- no guarding or rebound; normal bowel sounds; no organomegaly or masses palpated Ext:  decrROM; without deformities +arthritic changes; no varicose veins, venous insuffic, or edema;  Pulses intact w/o bruits. Neuro:  No focal neuro deficits; sensory testing normal; gait normal & balance OK. Derm:  No lesions noted; no rash etc. Lymph:  No cervical, supraclavicular, axillary, or inguinal adenopathy palpated.   Assessment:      IMP >>     Multifactorial dyspnea -- fibrosis, CHF, anemia, deconditioning all playing a roll     Pulmonary fibrosis -- sl progressive per 75yrCT follow up (see below)    Pulmonary nodules -- stable on recent CT Chest    NICM w/ chr sys CHF, LBBB -- per DrNasher, stable    AICD placed -- stable    Anemia- Iron defic & low B12 -- likely slow ooze from HH/ Barrett's & absorption issues -- rec FeSO4 Bid and B12 10057m/d regularly, stay on PPI Bid as well...    Other medical problems as noted> HL, IDDM (watch Actos rx w/ CHF diagnosis)  PLAN >>  11/30/14>   Wendle's recent incr in SOB was clearly due to his severe anemia which in turn was likely related to a slow ooze from his HH/ Barrett's mucosa and the fact that he stopped his oral iron therapy ~[id number];  He is feeling sl better after the 2uTx & oral iron Rx w/ Hg up tp ~10 now;  He is asked to stay on the Fe Bid & B12 1000m67m... 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 I  am in favor of considering some treatment for his pulm fibrosis- he was deemed not a candidate for pirfenedone due to his cardiac problems & AICD;  I outlined 3 options to the pt & his wife>  1) Conservative approach- continue observation to see how his dypnea improves w/ Fe & an exercise program;  2) Middle-of-the-road approach- perhaps considering an ISC like Pulmicort;  3) Aggressive approach- revisit eligibility for Pirfenedone vs trial of oral Pred... They clearly lean in the conservative direction & Terry Peck not want additional meds at this time; he wants to wait until his Hg has ret to normal & to see how his exercise program progresses... We plan ROV in 3 mo w/ CXR. 03/02/15>   DavidJaziahmproved, asked to decr the Fe & B12 as above; we will check ONO to assess need for continued Home oxygen; ROV planned 77mo..75mo/27/17>   Pt is resistent to the idea of home oxygen but his studies indicate need for NOCTURNAL O2 at 2L/min AND ambulatory O2 w/ exertion; he will capitulate to the former but declines O2 w/ exercise believing that he is stable & doing satis w/  quick recovery after exercise...  02/16/16>   Pt reports stable, no new complaints or concerns...     Plan:     Patient's Medications  New Prescriptions   No medications on file  Previous Medications   ASPIRIN EC 81 MG TABLET    Take 81 mg by mouth daily.   ATORVASTATIN (LIPITOR) 20 MG TABLET    Take 1 tablet (20 mg total) by mouth daily.   BD PEN NEEDLE NANO U/F 32G X 4 MM MISC    USE ONE  ONCE DAILY   CALCIUM-MAGNESIUM-ZINC 1000-400-15 MG TABS    Take 1 tablet by mouth daily.    CARVEDILOL (COREG) 25 MG TABLET    TAKE ONE TABLET BY MOUTH TWICE DAILY WITH MEALS   CHOLECALCIFEROL (VITAMIN D) 1000 UNITS CAPSULE    Take 1,000 Units by mouth 3 (three) times daily.    CYANOCOBALAMIN (B-12 PO)    Take 1 tablet by mouth 3 (three) times daily.    FERROUS SULFATE 325 (65 FE) MG EC TABLET    Take 325 mg by mouth daily with breakfast.   FUROSEMIDE  (LASIX) 40 MG TABLET    TAKE ONE TABLET BY MOUTH ONCE DAILY   IBUPROFEN (ADVIL,MOTRIN) 200 MG TABLET    Take 200-300 mg by mouth 2 (two) times daily as needed. For pain   LANTUS SOLOSTAR 100 UNIT/ML SOLOSTAR PEN    INJECT 40 UNITS SUBCUTANEOUSLY ONCE DAILY   LUTEIN 20 MG CAPS    Take 20 mg by mouth daily.   METFORMIN (GLUCOPHAGE) 850 MG TABLET    TAKE ONE TABLET BY MOUTH THREE TIMES DAILY   METHOCARBAMOL (ROBAXIN) 500 MG TABLET    TAKE ONE TABLET BY MOUTH ONCE DAILY AS NEEDED   MULTIPLE VITAMIN (MULTIVITAMIN WITH MINERALS) TABS    Take 1 tablet by mouth daily.   OMEGA 3 1000 MG CAPS    Take 1,000 mg by mouth 2 (two) times daily.    OMEPRAZOLE (PRILOSEC) 20 MG CAPSULE    TAKE ONE CAPSULE BY MOUTH TWICE DAILY   PIOGLITAZONE (ACTOS) 45 MG TABLET    TAKE ONE TABLET BY MOUTH AT BEDTIME  Modified Medications   No medications on file  Discontinued Medications   No medications on file

## 2016-02-20 ENCOUNTER — Ambulatory Visit: Payer: Medicare Other | Admitting: Pulmonary Disease

## 2016-02-25 ENCOUNTER — Ambulatory Visit (INDEPENDENT_AMBULATORY_CARE_PROVIDER_SITE_OTHER): Payer: Medicare Other | Admitting: Urgent Care

## 2016-02-25 ENCOUNTER — Ambulatory Visit (INDEPENDENT_AMBULATORY_CARE_PROVIDER_SITE_OTHER): Payer: Medicare Other

## 2016-02-25 VITALS — BP 120/80 | HR 66 | Temp 98.4°F | Ht 67.25 in | Wt 188.0 lb

## 2016-02-25 DIAGNOSIS — S6991XA Unspecified injury of right wrist, hand and finger(s), initial encounter: Secondary | ICD-10-CM | POA: Diagnosis not present

## 2016-02-25 DIAGNOSIS — M7989 Other specified soft tissue disorders: Secondary | ICD-10-CM

## 2016-02-25 DIAGNOSIS — S62306A Unspecified fracture of fifth metacarpal bone, right hand, initial encounter for closed fracture: Secondary | ICD-10-CM

## 2016-02-25 DIAGNOSIS — M79641 Pain in right hand: Secondary | ICD-10-CM

## 2016-02-25 DIAGNOSIS — S62646A Nondisplaced fracture of proximal phalanx of right little finger, initial encounter for closed fracture: Secondary | ICD-10-CM | POA: Diagnosis not present

## 2016-02-25 DIAGNOSIS — S62644A Nondisplaced fracture of proximal phalanx of right ring finger, initial encounter for closed fracture: Secondary | ICD-10-CM | POA: Diagnosis not present

## 2016-02-25 DIAGNOSIS — S62316A Displaced fracture of base of fifth metacarpal bone, right hand, initial encounter for closed fracture: Secondary | ICD-10-CM | POA: Diagnosis not present

## 2016-02-25 MED ORDER — ACETAMINOPHEN-CODEINE #3 300-30 MG PO TABS: 1.0000 | ORAL_TABLET | Freq: Three times a day (TID) | ORAL | 0 refills | Status: DC | PRN

## 2016-02-25 NOTE — Patient Instructions (Addendum)
Please call Dr. Donnal Moat office first thing on Monday to set up an appointment for the same day. You have multiple fractures of your right 4th and 5th fingers and need to be seen urgently. It was Dr. Donnal Moat recommendation that we place your hand in a splint and have you call his office Monday. In the meantime, please use 500mg  of Tylenol every 6 hours. For breakthrough pain, you may use Tylenol #3 every 8 hours as needed.   The phone number is [phone] for Dr. Donnal Moat office.     Finger Fracture Fractures of fingers are breaks in the bones of the fingers. There are many types of fractures. There are different ways of treating these fractures. Your health care provider will discuss the best way to treat your fracture. CAUSES Traumatic injury is the main cause of broken fingers. These include:  Injuries while playing sports.  Workplace injuries.  Falls. RISK FACTORS Activities that can increase your risk of finger fractures include:  Sports.  Workplace activities that involve machinery.  A condition called osteoporosis, which can make your bones less dense and cause them to fracture more easily. SIGNS AND SYMPTOMS The main symptoms of a broken finger are pain and swelling within 15 minutes after the injury. Other symptoms include:  Bruising of your finger.  Stiffness of your finger.  Numbness of your finger.  Exposed bones (compound fracture) if the fracture is severe. DIAGNOSIS  The best way to diagnose a broken bone is with X-ray imaging. Additionally, your health care provider will use this X-ray image to evaluate the position of the broken finger bones.  TREATMENT  Finger fractures can be treated with:   Nonreduction--This means the bones are in place. The finger is splinted without changing the positions of the bone pieces. The splint is usually left on for about a week to 10 days. This will depend on your fracture and what your health care provider thinks.  Closed  reduction--The bones are put back into position without using surgery. The finger is then splinted.  Open reduction and internal fixation--The fracture site is opened. Then the bone pieces are fixed into place with pins or some type of hardware. This is seldom required. It depends on the severity of the fracture. HOME CARE INSTRUCTIONS   Follow your health care provider's instructions regarding activities, exercises, and physical therapy.  Only take over-the-counter or prescription medicines for pain, discomfort, or fever as directed by your health care provider. SEEK MEDICAL CARE IF: You have pain or swelling that limits the motion or use of your fingers. SEEK IMMEDIATE MEDICAL CARE IF:  Your finger becomes numb. MAKE SURE YOU:   Understand these instructions.  Will watch your condition.  Will get help right away if you are not doing well or get worse.   This information is not intended to replace advice given to you by your health care provider. Make sure you discuss any questions you have with your health care provider.   Document Released: 07/22/2000 Document Revised: 01/28/2013 Document Reviewed: 11/19/2012 Elsevier Interactive Patient Education 2016 [employer]     Metacarpal Fracture A metacarpal fracture is a break (fracture) of a bone in the hand. Metacarpals are the bones that extend from your knuckles to your wrist. In each hand, you have five metacarpal bones that connect your fingers and your thumb to your wrist. Some hand fractures have bone pieces that are close together and stable (simple). These fractures may be treated with only a splint or  cast. Hand fractures that have many pieces of broken bone (comminuted), unstable bone pieces (displaced), or a bone that breaks through the skin (compound) usually require surgery. CAUSES This injury may be caused by:  A fall.  A hard, direct hit to your hand.  An injury that squeezes your knuckle, stretches your finger  out of place, or crushes your hand. RISK FACTORS This injury is more likely to occur if:  You play contact sports.  You have certain bone diseases. SYMPTOMS  Symptoms of this type of fracture develop soon after the injury. Symptoms may include:  Swelling.  Pain.  Stiffness.  Increased pain with movement.  Bruising.  Inability to move a finger.  A shortened finger.  A finger knuckle that looks sunken in.  Unusual appearance of the hand or finger (deformity). DIAGNOSIS  This injury may be diagnosed based on your signs and symptoms, especially if you had a recent hand injury. Your health care provider will perform a physical exam. He or she may also order X-rays to confirm the diagnosis.  TREATMENT  Treatment for this injury depends on the type of fracture you have and how severe it is. Possible treatments include:  Non-reduction. This can be done if the bone does not need to be moved back into place. The fracture can be casted or splinted as it is.   Closed reduction. If your bone is stable and can be moved back into place, you may only need to wear a cast or splint or have buddy taping.  Closed reduction with internal fixation (CRIF). This is the most common treatment. You may have this procedure if your bone can be moved back into place but needs more support. Wires, pins, or screws may be inserted through your skin to stabilize the fracture.  Open reduction with internal fixation (ORIF). This may be needed if your fracture is severe and unstable. It involves surgery to move your bone back into the right position. Screws, wires, or plates are used to stabilize the fracture. After all procedures, you may need to wear a cast or a splint for several weeks. You will also need to have follow-up X-rays to make sure that the bone is healing well and staying in position. After you no longer need your cast or splint, you may need physical therapy. This will help you to regain full  movement and strength in your hand.  HOME CARE INSTRUCTIONS  If You Have a Cast:  Do not stick anything inside the cast to scratch your skin. Doing that increases your risk of infection.  Check the skin around the cast every day. Report any concerns to your health care provider. You may put lotion on dry skin around the edges of the cast. Do not apply lotion to the skin underneath the cast. If You Have a Splint:  Wear it as directed by your health care provider. Remove it only as directed by your health care provider.  Loosen the splint if your fingers become numb and tingle, or if they turn cold and blue. Bathing  Cover the cast or splint with a watertight plastic bag to protect it from water while you take a bath or a shower. Do not let the cast or splint get wet. Managing Pain, Stiffness, and Swelling  If directed, apply ice to the injured area (if you have a splint, not a cast):  Put ice in a plastic bag.  Place a towel between your skin and the bag.  Leave the  ice on for 20 minutes, 2-3 times a day.  Move your fingers often to avoid stiffness and to lessen swelling.  Raise the injured area above the level of your heart while you are sitting or lying down. Driving  Do not drive or operate heavy machinery while taking pain medicine.  Do not drive while wearing a cast or splint on a hand that you use for driving. Activity  Return to your normal activities as directed by your health care provider. Ask your health care provider what activities are safe for you. General Instructions  Do not put pressure on any part of the cast or splint until it is fully hardened. This may take several hours.  Keep the cast or splint clean and dry.  Do not use any tobacco products, including cigarettes, chewing tobacco, or electronic cigarettes. Tobacco can delay bone healing. If you need help quitting, ask your health care provider.  Take medicines only as directed by your health care  provider.  Keep all follow-up visits as directed by your health care provider. This is important. SEEK MEDICAL CARE IF:   Your pain is getting worse.  You have redness, swelling, or pain in the injured area.   You have fluid, blood, or pus coming from under your cast or splint.   You notice a bad smell coming from under your cast or splint.   You have a fever.  SEEK IMMEDIATE MEDICAL CARE IF:   You develop a rash.   You have trouble breathing.   Your skin or nails on your injured hand turn blue or gray even after you loosen your splint.  Your injured hand feels cold or becomes numb even after you loosen your splint.   You develop severe pain under the cast or in your hand.   This information is not intended to replace advice given to you by your health care provider. Make sure you discuss any questions you have with your health care provider.   Document Released: 04/09/2005 Document Revised: 12/29/2014 Document Reviewed: 01/27/2014 Elsevier Interactive Patient Education 2016 [employer].     IF you received an x-ray today, you will receive an invoice from [facility]. Please contact [facility] at [phone] with questions or concerns regarding your invoice.   IF you received labwork today, you will receive an invoice from [employer]. Please contact [facility] at [phone] with questions or concerns regarding your invoice.   Our billing staff will not be able to assist you with questions regarding bills from these companies.  You will be contacted with the lab results as soon as they are available. The fastest way to get your results is to activate your My Chart account. Instructions are located on the last page of this paperwork. If you have not heard from [place] regarding the results in 2 weeks, please contact this office.

## 2016-02-25 NOTE — Progress Notes (Signed)
MRN: [id number] DOB: 09-Jan-1941  Subjective:   Terry Peck is a 75 y.o. male presenting for chief complaint of Fall (yesterday am.  Lost balance carrying microwave); Right hand pain (bruising & swelling); Facial Injury (Bruise to Right forehead, R/facial bones/zygoma); and Right upper anterior rib injury (soreness )  Reports suffering a fall yesterday, fall was on an outstretched hand, fell onto pavement/curb from a street. He also made might slight impact with his right forehead. His hand has progressively become swollen, increasingly painful, has bruising. Has tried Excedrin with minimal relief, did icing. Denies loss of consciousness, confusion, weakness, numbness or tingling.   Terry Peck has a current medication list which includes the following prescription(s): aspirin ec, atorvastatin, bd pen needle nano u/f, calcium-magnesium-zinc, carvedilol, vitamin d, cyanocobalamin, ferrous sulfate, furosemide, ibuprofen, lantus solostar, lutein, metformin, methocarbamol, multivitamin with minerals, omega 3, omeprazole, and pioglitazone. Also is allergic to penicillins; iodine; and fish oil.  Terry Peck  has a past medical history of Abnormal ANCA test (07/03/2011); Allergy; Arthritis; B12 deficiency anemia; Barrett's esophagus (11/23/2010); Bundle branch block, left; Cataract; CHF (congestive heart failure) ([place]); Chronic systolic dysfunction of left ventricle; Colon polyps (11/23/2010); GERD (gastroesophageal reflux disease); Hyperlipidemia (10/14/2012); Hypertension; ICD (implantable cardiac defibrillator) in place; Iron deficiency anemia (05/30/2011); Liver abscess (1980's); Nonischemic cardiomyopathy ([place]); Pulmonary fibrosis ([place]); Pulmonary nodules; Shortness of breath; and Type II diabetes mellitus ([place]). Also  has a past surgical history that includes Tonsillectomy (1952); Cardiac defibrillator placement (01/29/2012); abscess drained (?1980's); bi-ventricular implantable cardioverter defibrillator (N/A,  01/29/2012); Cataract extraction (Left); Colonoscopy; and Upper gi endoscopy.  Objective:   Vitals: BP 120/80 (BP Location: Left Arm, Patient Position: Sitting, Cuff Size: Large)   Pulse 66   Temp 98.4 F (36.9 C) (Oral)   Ht 5' 7.25" (1.708 m)   Wt 188 lb (85.3 kg)   SpO2 93% Comment: idopathic pulmonary fibrosis  BMI 29.23 kg/m   Physical Exam  Constitutional: He is oriented to person, place, and time. He appears well-developed and well-nourished.  HENT:  TM's intact bilaterally, no effusions or erythema. Nasal turbinates pink and moist, nasal passages patent. No sinus tenderness. Oropharynx clear, mucous membranes moist, dentition in good repair.  Eyes: EOM are normal. Pupils are equal, round, and reactive to light. No scleral icterus.  Cardiovascular: Normal rate, regular rhythm and intact distal pulses.  Exam reveals no gallop and no friction rub.   No murmur heard. Pulmonary/Chest: Effort normal and breath sounds normal. He has no wheezes. He has no rales. He exhibits no tenderness.  Musculoskeletal:       Right wrist: He exhibits normal range of motion, no tenderness, no bony tenderness, no swelling, no effusion, no crepitus, no deformity and no laceration.       Right hand: He exhibits decreased range of motion, tenderness, bony tenderness and swelling (over metacarpals up to MCP joints with associated ecchymosis over palmar surface of right hand). He exhibits normal capillary refill, no deformity and no laceration. Normal sensation noted. Decreased strength noted.  Neurological: He is alert and oriented to person, place, and time. He has normal reflexes. No cranial nerve deficit. Coordination normal.  Skin: Skin is warm and dry.   Dg Hand Complete Right  Result Date: 02/25/2016 CLINICAL DATA:  Fall yesterday with pain and swelling in the medial hand. EXAM: RIGHT HAND - COMPLETE 3+ VIEW COMPARISON:  None. FINDINGS: Mildly displaced distal fifth metacarpal fracture is seen.  Generalized soft tissue swelling in this region is noted. Interphalangeal degenerative changes are  seen. Undisplaced fractures at the base of the fourth and fifth proximal phalanges are seen as well. IMPRESSION: Multiple fractures involving the fourth and fifth digits as described. Electronically Signed   By: Inez Catalina M.D.   On: 02/25/2016 10:34    Assessment and Plan :   Case discussed with Dr. Wynetta Fines, hand surgeon on call.   1. Hand injury, right, initial encounter 2. Right hand pain 3. Swelling of right hand 4. Closed nondisplaced fracture of proximal phalanx of right little finger, initial encounter 5. Closed nondisplaced fracture of proximal phalanx of right ring finger, initial encounter 6. Closed nondisplaced fracture of fifth metacarpal bone of right hand, unspecified portion of metacarpal, initial encounter - Ulnar gutter splint applied to immobilize right 4th and 5th digits with the excellent assistance of [place], [place]. Patient tolerated this well. He is to schedule APAP every 6-8 hours and use Tylenol #3 for breakthrough pain. Dr. Wynetta Fines recommended he call his office on 02/27/2016 to see him. Patient verbalized understanding.  Jaynee Eagles, PA-C [facility] [phone] 02/25/2016 10:38 AM

## 2016-02-27 DIAGNOSIS — S62644A Nondisplaced fracture of proximal phalanx of right ring finger, initial encounter for closed fracture: Secondary | ICD-10-CM | POA: Diagnosis not present

## 2016-02-27 DIAGNOSIS — S62336A Displaced fracture of neck of fifth metacarpal bone, right hand, initial encounter for closed fracture: Secondary | ICD-10-CM | POA: Diagnosis not present

## 2016-03-03 ENCOUNTER — Other Ambulatory Visit: Payer: Self-pay | Admitting: Cardiovascular Disease

## 2016-03-05 ENCOUNTER — Ambulatory Visit (INDEPENDENT_AMBULATORY_CARE_PROVIDER_SITE_OTHER): Payer: Medicare Other

## 2016-03-05 DIAGNOSIS — Z95810 Presence of automatic (implantable) cardiac defibrillator: Secondary | ICD-10-CM

## 2016-03-05 DIAGNOSIS — I5022 Chronic systolic (congestive) heart failure: Secondary | ICD-10-CM

## 2016-03-07 NOTE — Progress Notes (Signed)
EPIC Encounter for ICM Monitoring  Patient Name: Terry Peck is a 75 y.o. male Date: 03/07/2016 Primary Care Physican: Cathlean Cower, MD Primary Cardiologist:Nahser Electrophysiologist: Allred Dry Weight:    181 lb  Bi-V Pacing:  >99%         Heart Failure questions reviewed, pt asymptomatic   Thoracic impedance normal since 02/24/2016.   Recommendations: No changes.  Advised to limit salt intake to 2000 mg daily.  Encouraged to call for fluid symptoms.    Follow-up plan: ICM clinic phone appointment on 04/09/2016.  Copy of ICM check sent to device physician.   ICM trend: 03/05/2016       Rosalene Billings, RN 03/07/2016 8:31 AM

## 2016-03-22 DIAGNOSIS — H20022 Recurrent acute iridocyclitis, left eye: Secondary | ICD-10-CM | POA: Diagnosis not present

## 2016-03-22 DIAGNOSIS — Z961 Presence of intraocular lens: Secondary | ICD-10-CM | POA: Diagnosis not present

## 2016-04-09 ENCOUNTER — Ambulatory Visit (INDEPENDENT_AMBULATORY_CARE_PROVIDER_SITE_OTHER): Payer: Medicare Other

## 2016-04-09 DIAGNOSIS — I5022 Chronic systolic (congestive) heart failure: Secondary | ICD-10-CM | POA: Diagnosis not present

## 2016-04-09 DIAGNOSIS — Z95810 Presence of automatic (implantable) cardiac defibrillator: Secondary | ICD-10-CM

## 2016-04-09 NOTE — Progress Notes (Signed)
EPIC Encounter for ICM Monitoring  Patient Name: Terry Peck is a 75 y.o. male Date: 04/09/2016 Primary Care Physican: Cathlean Cower, MD Primary Cardiologist:Nahser Electrophysiologist: Allred Dry Weight:183lb  Bi-V Pacing: >99%       Heart Failure questions reviewed, pt asymptomatic   Thoracic impedance abnormal suggesting fluid accumulation.  Labs: 02/09/2016 Creatinine 0.79, BUN 15, Potassium 5.1, Sodium 140 11/09/2015 Creatinine 0.82, BUN 18, Potassium 4.1, Sodium 138, EGFR 97.26   Recommendation to increase Furosemide 40 mg to 1 tablet twice a day x 2 days and then return to prescribed dosage of 1 tablet daily.   Reinforced to limit low salt food choices to 2000 mg day and limiting fluid intake to < 2 liters per day.  Provided direct ICM number.    Follow-up plan: ICM clinic phone appointment on 04/17/2016.  Copy of ICM check sent to primary cardiologist and device physician.   ICM trend: 04/09/2016       Rosalene Billings, RN 04/09/2016 2:38 PM

## 2016-04-17 ENCOUNTER — Ambulatory Visit (INDEPENDENT_AMBULATORY_CARE_PROVIDER_SITE_OTHER): Payer: Medicare Other

## 2016-04-17 ENCOUNTER — Telehealth: Payer: Self-pay | Admitting: Cardiology

## 2016-04-17 DIAGNOSIS — Z95810 Presence of automatic (implantable) cardiac defibrillator: Secondary | ICD-10-CM

## 2016-04-17 DIAGNOSIS — I5022 Chronic systolic (congestive) heart failure: Secondary | ICD-10-CM

## 2016-04-17 NOTE — Progress Notes (Signed)
EPIC Encounter for ICM Monitoring  Patient Name: Terry Peck is a 75 y.o. male Date: 04/17/2016 Primary Care Physican: Cathlean Cower, MD Primary Cardiologist:Nahser Electrophysiologist: Allred Dry Weight:183lb  Bi-V Pacing: >99%          No ICM remote transmission received scheduled for 12/26 but the direct trend viewer shows updated thoracic impedance through 04/16/2016  Thoracic impedance returned to normal on 04/10/2016 after advised to take increased Furosemide x 2 days on 12/18  Labs: 02/09/2016 Creatinine 0.79, BUN 15, Potassium 5.1, Sodium 140 11/09/2015 Creatinine 0.82, BUN 18, Potassium 4.1, Sodium 138, EGFR 97.26   Recommendations: None  Follow-up plan: ICM clinic phone appointment on 05/11/2016.  Copy of ICM check sent to primary cardiologist and device physician.   ICM trend: Direct Trendviewer showing through 04/16/2016        Rosalene Billings, RN 04/17/2016 4:44 PM

## 2016-04-17 NOTE — Telephone Encounter (Signed)
Attempted to confirm remote transmission with pt. No answer and was unable to leave a message.   

## 2016-05-01 ENCOUNTER — Other Ambulatory Visit: Payer: Self-pay | Admitting: Internal Medicine

## 2016-05-11 ENCOUNTER — Ambulatory Visit (INDEPENDENT_AMBULATORY_CARE_PROVIDER_SITE_OTHER): Payer: Medicare Other

## 2016-05-11 DIAGNOSIS — I5022 Chronic systolic (congestive) heart failure: Secondary | ICD-10-CM

## 2016-05-11 DIAGNOSIS — Z95810 Presence of automatic (implantable) cardiac defibrillator: Secondary | ICD-10-CM

## 2016-05-14 NOTE — Progress Notes (Signed)
EPIC Encounter for ICM Monitoring  Patient Name: Terry Peck is a 76 y.o. male Date: 05/14/2016 Primary Care Physican: Cathlean Cower, MD Primary Cardiologist:Nahser Electrophysiologist: Allred Dry Weight:unknown   Bi-V Pacing: >99%       Spoke with wife.  Heart Failure questions reviewed, pt asymptomatic for fluid symptoms but she thinks he may be getting the flu.  Advised to call PCP.   Thoracic impedance normal   Labs: 02/09/2016 Creatinine 0.79, BUN 15, Potassium 5.1, Sodium 140 11/09/2015 Creatinine 0.82, BUN 18, Potassium 4.1, Sodium 138, EGFR 97.26   Recommendations: No changes. Encouraged to call for fluid symptoms.  Follow-up plan: ICM clinic phone appointment on 06/14/2016.  Copy of ICM check sent to device physician.   3 month ICM trend: 05/11/2016   1 Year ICM trend:      Rosalene Billings, RN 05/14/2016 11:39 AM

## 2016-05-15 ENCOUNTER — Other Ambulatory Visit: Payer: Medicare Other

## 2016-06-05 ENCOUNTER — Encounter: Payer: Self-pay | Admitting: Cardiovascular Disease

## 2016-06-09 DIAGNOSIS — J8410 Pulmonary fibrosis, unspecified: Secondary | ICD-10-CM | POA: Diagnosis not present

## 2016-06-09 DIAGNOSIS — J84112 Idiopathic pulmonary fibrosis: Secondary | ICD-10-CM | POA: Diagnosis not present

## 2016-06-09 DIAGNOSIS — I110 Hypertensive heart disease with heart failure: Secondary | ICD-10-CM | POA: Diagnosis not present

## 2016-06-09 DIAGNOSIS — I5022 Chronic systolic (congestive) heart failure: Secondary | ICD-10-CM | POA: Diagnosis not present

## 2016-06-14 ENCOUNTER — Ambulatory Visit (INDEPENDENT_AMBULATORY_CARE_PROVIDER_SITE_OTHER): Payer: Medicare Other | Admitting: *Deleted

## 2016-06-14 ENCOUNTER — Ambulatory Visit (INDEPENDENT_AMBULATORY_CARE_PROVIDER_SITE_OTHER): Payer: Medicare Other

## 2016-06-14 ENCOUNTER — Telehealth: Payer: Self-pay | Admitting: Cardiology

## 2016-06-14 DIAGNOSIS — Z95810 Presence of automatic (implantable) cardiac defibrillator: Secondary | ICD-10-CM

## 2016-06-14 DIAGNOSIS — I5022 Chronic systolic (congestive) heart failure: Secondary | ICD-10-CM

## 2016-06-14 NOTE — Telephone Encounter (Signed)
LMOVM reminding pt to send remote transmission.   

## 2016-06-15 ENCOUNTER — Encounter: Payer: Self-pay | Admitting: Cardiology

## 2016-06-15 LAB — CUP PACEART REMOTE DEVICE CHECK
Battery Voltage: 2.92 V
Brady Statistic AP VS Percent: 1 %
Brady Statistic AS VP Percent: 79 %
Date Time Interrogation Session: 20180222090020
HighPow Impedance: 70 Ohm
HighPow Impedance: 70 Ohm
Implantable Lead Implant Date: 20131008
Implantable Lead Implant Date: 20131008
Implantable Lead Location: 753858
Implantable Lead Location: 753860
Implantable Pulse Generator Implant Date: 20131008
Lead Channel Impedance Value: 390 Ohm
Lead Channel Impedance Value: 390 Ohm
Lead Channel Impedance Value: 950 Ohm
Lead Channel Pacing Threshold Amplitude: 0.625 V
Lead Channel Pacing Threshold Pulse Width: 0.5 ms
Lead Channel Sensing Intrinsic Amplitude: 11.4 mV
Lead Channel Setting Pacing Amplitude: 2 V
Lead Channel Setting Pacing Pulse Width: 0.5 ms
MDC IDC LEAD IMPLANT DT: 20131008
MDC IDC LEAD LOCATION: 753859
MDC IDC MSMT BATTERY REMAINING LONGEVITY: 42 mo
MDC IDC MSMT BATTERY REMAINING PERCENTAGE: 46 %
MDC IDC MSMT LEADCHNL LV PACING THRESHOLD AMPLITUDE: 0.75 V
MDC IDC MSMT LEADCHNL LV PACING THRESHOLD PULSEWIDTH: 0.5 ms
MDC IDC MSMT LEADCHNL RA SENSING INTR AMPL: 3.4 mV
MDC IDC MSMT LEADCHNL RV PACING THRESHOLD AMPLITUDE: 0.75 V
MDC IDC MSMT LEADCHNL RV PACING THRESHOLD PULSEWIDTH: 0.5 ms
MDC IDC SET LEADCHNL RA PACING AMPLITUDE: 1.625
MDC IDC SET LEADCHNL RV PACING AMPLITUDE: 2 V
MDC IDC SET LEADCHNL RV PACING PULSEWIDTH: 0.5 ms
MDC IDC SET LEADCHNL RV SENSING SENSITIVITY: 0.5 mV
MDC IDC STAT BRADY AP VP PERCENT: 21 %
MDC IDC STAT BRADY AS VS PERCENT: 1 %
MDC IDC STAT BRADY RA PERCENT PACED: 20 %
Pulse Gen Serial Number: 7054987

## 2016-06-15 NOTE — Progress Notes (Signed)
Remote ICD transmission.   

## 2016-06-15 NOTE — Progress Notes (Signed)
EPIC Encounter for ICM Monitoring  Patient Name: Terry Peck is a 76 y.o. male Date: 06/15/2016 Primary Care Physican: Cathlean Cower, MD Primary Cardiologist:Nahser Electrophysiologist: Allred Dry Weight:unknown   Bi-V Pacing: >99%      Heart Failure questions reviewed, pt asymptomatic    Thoracic impedance normal but was abnormal suggesting fluid accumulation from 06/06/2016 to 06/11/2016.  Prescribed dosage Furosemide 40 mg 1 tablet daily  Labs: 02/09/2016 Creatinine 0.79, BUN 15, Potassium 5.1, Sodium 140 11/09/2015 Creatinine 0.82, BUN 18, Potassium 4.1, Sodium 138, EGFR 97.26   Recommendations: No changes. Reminded to limit dietary salt intake to 2000 mg/day and fluid intake to < 2 liters/day. Encouraged to call for fluid symptoms.  Follow-up plan: ICM clinic phone appointment on 07/16/2016.  Copy of ICM check sent to device physician.   3 month ICM trend: 06/15/2016   1 Year ICM trend:      Rosalene Billings, RN 06/15/2016 9:54 AM

## 2016-06-16 ENCOUNTER — Other Ambulatory Visit: Payer: Self-pay | Admitting: Internal Medicine

## 2016-06-21 DIAGNOSIS — Z961 Presence of intraocular lens: Secondary | ICD-10-CM | POA: Diagnosis not present

## 2016-06-21 DIAGNOSIS — H52203 Unspecified astigmatism, bilateral: Secondary | ICD-10-CM | POA: Diagnosis not present

## 2016-06-21 DIAGNOSIS — E113291 Type 2 diabetes mellitus with mild nonproliferative diabetic retinopathy without macular edema, right eye: Secondary | ICD-10-CM | POA: Diagnosis not present

## 2016-06-21 LAB — HM DIABETES EYE EXAM

## 2016-06-27 ENCOUNTER — Encounter: Payer: Self-pay | Admitting: Internal Medicine

## 2016-06-27 NOTE — Progress Notes (Unsigned)
Results entered and sent to scan  

## 2016-07-07 DIAGNOSIS — J84112 Idiopathic pulmonary fibrosis: Secondary | ICD-10-CM | POA: Diagnosis not present

## 2016-07-07 DIAGNOSIS — I5022 Chronic systolic (congestive) heart failure: Secondary | ICD-10-CM | POA: Diagnosis not present

## 2016-07-07 DIAGNOSIS — J8410 Pulmonary fibrosis, unspecified: Secondary | ICD-10-CM | POA: Diagnosis not present

## 2016-07-07 DIAGNOSIS — I110 Hypertensive heart disease with heart failure: Secondary | ICD-10-CM | POA: Diagnosis not present

## 2016-07-16 ENCOUNTER — Ambulatory Visit (INDEPENDENT_AMBULATORY_CARE_PROVIDER_SITE_OTHER): Payer: Medicare Other

## 2016-07-16 DIAGNOSIS — I5022 Chronic systolic (congestive) heart failure: Secondary | ICD-10-CM

## 2016-07-16 DIAGNOSIS — Z95810 Presence of automatic (implantable) cardiac defibrillator: Secondary | ICD-10-CM | POA: Diagnosis not present

## 2016-07-16 NOTE — Progress Notes (Signed)
EPIC Encounter for ICM Monitoring  Patient Name: Terry Peck is a 76 y.o. male Date: 07/16/2016 Primary Care Physican: Cathlean Cower, MD Primary Cardiologist:Nahser Electrophysiologist: Allred Dry Weight:unknown  Bi-V Pacing: >99%       Heart Failure questions reviewed, pt asymptomatic.   Thoracic impedance normal.  Prescribed dosage Furosemide 40 mg 1 tablet daily  Labs: 02/09/2016 Creatinine 0.79, BUN 15, Potassium 5.1, Sodium 140 11/09/2015 Creatinine 0.82, BUN 18, Potassium 4.1, Sodium 138, EGFR 97.26   Recommendations: No changes. Reminded to limit dietary salt intake to 2000 mg/day and fluid intake to < 2 liters/day. Encouraged to call for fluid symptoms.  Follow-up plan: ICM clinic phone appointment on 08/16/2016 and office appointment with Dr Acie Fredrickson 08/17/2016.  Copy of ICM check sent to device physician.   3 month ICM trend: 07/16/2016   1 Year ICM trend:      Rosalene Billings, RN 07/16/2016 10:18 AM

## 2016-07-17 ENCOUNTER — Other Ambulatory Visit: Payer: Medicare Other

## 2016-07-17 DIAGNOSIS — E782 Mixed hyperlipidemia: Secondary | ICD-10-CM | POA: Diagnosis not present

## 2016-07-17 DIAGNOSIS — I5022 Chronic systolic (congestive) heart failure: Secondary | ICD-10-CM | POA: Diagnosis not present

## 2016-07-17 LAB — COMPREHENSIVE METABOLIC PANEL
A/G RATIO: 1.7 (ref 1.2–2.2)
ALT: 29 IU/L (ref 0–44)
AST: 30 IU/L (ref 0–40)
Albumin: 4.1 g/dL (ref 3.5–4.8)
Alkaline Phosphatase: 41 IU/L (ref 39–117)
BUN/Creatinine Ratio: 26 — ABNORMAL HIGH (ref 10–24)
BUN: 22 mg/dL (ref 8–27)
Bilirubin Total: 0.2 mg/dL (ref 0.0–1.2)
CALCIUM: 9.6 mg/dL (ref 8.6–10.2)
CO2: 24 mmol/L (ref 18–29)
CREATININE: 0.86 mg/dL (ref 0.76–1.27)
Chloride: 100 mmol/L (ref 96–106)
GFR, EST AFRICAN AMERICAN: 97 mL/min/{1.73_m2} (ref >59)
GFR, EST NON AFRICAN AMERICAN: 84 mL/min/{1.73_m2} (ref >59)
GLOBULIN, TOTAL: 2.4 g/dL (ref 1.5–4.5)
Glucose: 54 mg/dL — ABNORMAL LOW (ref 65–99)
POTASSIUM: 4.8 mmol/L (ref 3.5–5.2)
SODIUM: 141 mmol/L (ref 134–144)
TOTAL PROTEIN: 6.5 g/dL (ref 6.0–8.5)

## 2016-07-17 LAB — LIPID PANEL
CHOLESTEROL TOTAL: 104 mg/dL (ref 100–199)
Chol/HDL Ratio: 2.5 ratio units (ref 0.0–5.0)
HDL: 42 mg/dL (ref >39)
LDL Calculated: 42 mg/dL (ref 0–99)
TRIGLYCERIDES: 100 mg/dL (ref 0–149)
VLDL Cholesterol Cal: 20 mg/dL (ref 5–40)

## 2016-07-26 ENCOUNTER — Other Ambulatory Visit: Payer: Self-pay | Admitting: Internal Medicine

## 2016-07-31 ENCOUNTER — Encounter: Payer: Self-pay | Admitting: Cardiovascular Disease

## 2016-08-07 DIAGNOSIS — I110 Hypertensive heart disease with heart failure: Secondary | ICD-10-CM | POA: Diagnosis not present

## 2016-08-07 DIAGNOSIS — I5022 Chronic systolic (congestive) heart failure: Secondary | ICD-10-CM | POA: Diagnosis not present

## 2016-08-07 DIAGNOSIS — J84112 Idiopathic pulmonary fibrosis: Secondary | ICD-10-CM | POA: Diagnosis not present

## 2016-08-07 DIAGNOSIS — J8410 Pulmonary fibrosis, unspecified: Secondary | ICD-10-CM | POA: Diagnosis not present

## 2016-08-11 ENCOUNTER — Other Ambulatory Visit: Payer: Self-pay | Admitting: Internal Medicine

## 2016-08-16 ENCOUNTER — Ambulatory Visit (INDEPENDENT_AMBULATORY_CARE_PROVIDER_SITE_OTHER): Payer: Medicare Other | Admitting: Pulmonary Disease

## 2016-08-16 ENCOUNTER — Other Ambulatory Visit (INDEPENDENT_AMBULATORY_CARE_PROVIDER_SITE_OTHER): Payer: Medicare Other

## 2016-08-16 ENCOUNTER — Encounter: Payer: Self-pay | Admitting: Pulmonary Disease

## 2016-08-16 ENCOUNTER — Ambulatory Visit (INDEPENDENT_AMBULATORY_CARE_PROVIDER_SITE_OTHER): Payer: Medicare Other

## 2016-08-16 VITALS — BP 120/80 | HR 72 | Temp 98.8°F | Ht 68.0 in | Wt 183.2 lb

## 2016-08-16 DIAGNOSIS — J84112 Idiopathic pulmonary fibrosis: Secondary | ICD-10-CM

## 2016-08-16 DIAGNOSIS — Z862 Personal history of diseases of the blood and blood-forming organs and certain disorders involving the immune mechanism: Secondary | ICD-10-CM | POA: Insufficient documentation

## 2016-08-16 DIAGNOSIS — R0600 Dyspnea, unspecified: Secondary | ICD-10-CM

## 2016-08-16 DIAGNOSIS — I110 Hypertensive heart disease with heart failure: Secondary | ICD-10-CM

## 2016-08-16 DIAGNOSIS — I5022 Chronic systolic (congestive) heart failure: Secondary | ICD-10-CM

## 2016-08-16 DIAGNOSIS — G4734 Idiopathic sleep related nonobstructive alveolar hypoventilation: Secondary | ICD-10-CM | POA: Diagnosis not present

## 2016-08-16 DIAGNOSIS — J984 Other disorders of lung: Secondary | ICD-10-CM

## 2016-08-16 DIAGNOSIS — I447 Left bundle-branch block, unspecified: Secondary | ICD-10-CM | POA: Diagnosis not present

## 2016-08-16 DIAGNOSIS — R0902 Hypoxemia: Secondary | ICD-10-CM | POA: Diagnosis not present

## 2016-08-16 DIAGNOSIS — Z95810 Presence of automatic (implantable) cardiac defibrillator: Secondary | ICD-10-CM | POA: Diagnosis not present

## 2016-08-16 DIAGNOSIS — R911 Solitary pulmonary nodule: Secondary | ICD-10-CM

## 2016-08-16 DIAGNOSIS — E119 Type 2 diabetes mellitus without complications: Secondary | ICD-10-CM

## 2016-08-16 LAB — CBC WITH DIFFERENTIAL/PLATELET
BASOS PCT: 0.6 % (ref 0.0–3.0)
Basophils Absolute: 0.1 10*3/uL (ref 0.0–0.1)
EOS ABS: 0.1 10*3/uL (ref 0.0–0.7)
EOS PCT: 1.2 % (ref 0.0–5.0)
HEMATOCRIT: 46.1 % (ref 39.0–52.0)
HEMOGLOBIN: 15.5 g/dL (ref 13.0–17.0)
LYMPHS PCT: 18.4 % (ref 12.0–46.0)
Lymphs Abs: 1.9 10*3/uL (ref 0.7–4.0)
MCHC: 33.5 g/dL (ref 30.0–36.0)
MCV: 95.6 fl (ref 78.0–100.0)
Monocytes Absolute: 0.8 10*3/uL (ref 0.1–1.0)
Monocytes Relative: 7.4 % (ref 3.0–12.0)
NEUTROS ABS: 7.6 10*3/uL (ref 1.4–7.7)
Neutrophils Relative %: 72.4 % (ref 43.0–77.0)
Platelets: 254 10*3/uL (ref 150.0–400.0)
RBC: 4.82 Mil/uL (ref 4.22–5.81)
RDW: 14.9 % (ref 11.5–15.5)
WBC: 10.5 10*3/uL (ref 4.0–10.5)

## 2016-08-16 LAB — BRAIN NATRIURETIC PEPTIDE: PRO B NATRI PEPTIDE: 39 pg/mL (ref 0.0–100.0)

## 2016-08-16 LAB — BASIC METABOLIC PANEL
BUN: 24 mg/dL — ABNORMAL HIGH (ref 6–23)
CALCIUM: 10 mg/dL (ref 8.4–10.5)
CO2: 30 meq/L (ref 19–32)
CREATININE: 0.99 mg/dL (ref 0.40–1.50)
Chloride: 106 mEq/L (ref 96–112)
GFR: 78.09 mL/min (ref >60.00)
Glucose, Bld: 137 mg/dL — ABNORMAL HIGH (ref 70–99)
Potassium: 4.7 mEq/L (ref 3.5–5.1)
Sodium: 143 mEq/L (ref 135–145)

## 2016-08-16 LAB — IBC PANEL
IRON: 87 ug/dL (ref 42–165)
Saturation Ratios: 19.3 % — ABNORMAL LOW (ref 20.0–50.0)
TRANSFERRIN: 322 mg/dL (ref 212.0–360.0)

## 2016-08-16 LAB — FERRITIN: Ferritin: 18.7 ng/mL — ABNORMAL LOW (ref 22.0–322.0)

## 2016-08-16 LAB — HEMOGLOBIN A1C: Hgb A1c MFr Bld: 6.7 % — ABNORMAL HIGH (ref 4.6–6.5)

## 2016-08-16 NOTE — Progress Notes (Signed)
EPIC Encounter for ICM Monitoring  Patient Name: Terry Peck is a 76 y.o. male Date: 08/16/2016 Primary Care Physican: Cathlean Cower, MD Primary Cardiologist:Nahser Electrophysiologist: Allred Dry Weight:unknown  Bi-V Pacing: >99%      Heart Failure questions reviewed, pt asymptomatic.   Thoracic impedance is normal but was abnormal suggesting fluid accumulation 08/05/2016 to 08/12/2016 and 07/17/2016 to 07/27/2016 for total of 17 days in last month  Prescribed dosage Furosemide 40 mg 1 tablet daily  Labs: 07/17/2016 Creatinine 0.86, BUN 54, Potassium 4.8, Sodium 141 02/09/2016 Creatinine 0.79, BUN 15, Potassium 5.1, Sodium 140 11/09/2015 Creatinine 0.82, BUN 18, Potassium 4.1, Sodium 138, EGFR 97.26   Recommendations: No changes. Discussed to limit salt intake to 2000 mg/day and fluid intake to < 2 liters/day.  Encouraged to call for fluid symptoms.  Follow-up plan: ICM clinic phone appointment on 09/18/2016.  Office appointment scheduled on 08/17/2016 with Dr Acie Fredrickson.  Copy of ICM check sent to primary cardiologist and device physician.   3 month ICM trend: 08/16/2016   1 Year ICM trend:     Rosalene Billings, RN 08/16/2016 9:43 AM

## 2016-08-16 NOTE — Progress Notes (Addendum)
Subjective:     Patient ID: Terry Peck, male   DOB: 12-Apr-1941, 76 y.o.   MRN: [id number]  HPI 76 y/o WM, referred by Dr. Cathlean Peck for increased SOB>> He is a friend of my pt Terry Peck...             Terry Peck was prev evaluated in 2013-14 by MR & notes summarized below>>  IOV 06/20/2011- Referred for lung nodule and dyspnea:    76year old male. reports that he quit smoking about 23 years ago. His smoking use included Cigarettes. He has a 5 pack-year smoking history (1/2 pack per day x 10 years). He has never used smokeless tobacco. Body mass index is 28.15 kg/(m^2). Known DM with nocturia - started on insulin 06/20/2011, HBP, Arthritis, Barretts- seeing Dr Terry Peck    Repoirts hx of small lung nodule with serial CT fu at [facility] for 2 years at [facility] ending 5 years ago. Stable and dc from followup. Does not remember which side    Reports insidious onset of DOE x 1year. Progressive gradually. Improved by rest. Notices when he mows yard, walking briskly one flight of stairs notices dyspnea. Always improved by rest. Wife notices he is not able to keep up with her during walks outside the home. No associated weight gain, or admissions to hospitals, no new medical problems diagnosed since onset of symptoms. Denies associated chest pain, cough, fever, weight gain, hemoptysis, edema, orthopnea, paroxysmal nocturnal dyspnea, wheezing. Systolic function was moderately to severely reduced.     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 Has active GERD. Denies collagen vascular disease. No steel dust, metal dust or asbestos exposure. Denies amio intake. Denies chemo. Does not have pet birds or feathery pillows     ECHO - 05/31/11: ejection fraction was in the range of 30% to 35%. There is hypokinesis of the inferior and inferoseptal myocardium. Doppler parameters are consistent with abnormal left ventricular relaxation (grade 1 diastolic dysfunction).  Ventricular septum: These changes are consistent with a left bundle branch block. Mitral valve: Mildly calcified annulus. Mildly thickened leaflets. Pulmonary arteries: Systolic pressure was mildly increased. PA peak pressure: 62m Hg.     CT CHEST 05/31/2011 Findings: No enlarged axillary or supraclavicular lymph nodes. There is no enlarged mediastinal or hilar lymph nodes. No pericardial or pleural effusion identified. There is a patulous and fluid-filled thoracic esophagus. There is peripheral and basilar predominant interstitial reticulation. Bibasilar subpleural honeycombing is also identified. Mild bronchiectasis is present in the lung bases. Ground-glass attenuation nodular density is noted within the left upper lobe measuring 1.8 cm, image 29. Solid nodule within left lower lobe measures 6 mm. Review of the visualized osseous structures is unremarkable. Limited imaging through the upper abdomen is significant for a left renal cyst. Only partially visualized and incompletely characterized without IV contrast. IMPRESSION: 1. 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 2. Ground-glass attenuating nodule within the left upper lobe is nonspecific and may be related to interstitial lung disease. Low grade pulmonary adenocarcinoma may have a similar appearance. Follow-up imaging in 3 months is recommended to ensure stability.      PFT 06/06/11:Signs of restrictions c/w the ILD we see on CT present even though TLC 5L/81% is normal. This is because FVC 2.9L/69% and reduced (fev1 is 2.45L/86% and normal). ratip is 84 and normal. DLCO is reduced 11/50%  #lung nodule  - please sign release for [facility] to  send CD rom of CT chest  - likely not cancer but if we cannot get [facility] records we have to follow this iwht CT in 9 months  #Pulmonary fibrosis  - do not know why but acid reflux could have played a role  - please Serum: ESR, ANA, DS-DNA, RF, anti-CCP, ssA,  ssB, scl-70, ANCA, Total CK, Aldolase, Hypersensitivity Pneumonitis Panel, ACE level  - will call you with results to decide next step  #Shortness of breath  - both due to heart and lung issues  - between the two the heart is more important priority - please see cardiology ASAP  - our Nurse will ensure you have an appointment next few days    OV 07/19/2011:    Review dyspnea related to ILD NOS and new chronic systolic CHF. Since last visit has seen cardiology Dr Terry Peck. States that with cards med mgmt: dyspnea improved a "little bit". Able to do more. Can now climb 2 flights of stairs non-stop (last visit only 1 flight of stairs). Saw Dr Terry Peck yesterday 07/18/11: - NICM suspected- 07/11/11: Abnormal stress nuclear study: Dilated left ventricle with global hypokinesis. Suspect small mild area of reversible ischemia in mid anterior wall. The degree of severe LV dysfunction is out of proportion to the mild ischemia noted. LV Ejection Fraction: 29%. LV Wall Motion: Severe global hypokinesis. Meds adjusted. Cath on hold. Plan to place pacer if no improvement in EF with med mgmt. In temrs of workup for pulmonary fibrosis:     LABS 06/20/11 auto-immune test result: all negative including HP pa  nel except anca is trace positive at 1:20 which is the cut off between negative and positive. ESR 29 #lung nodule  - there are 2 spots in lung and there is possibiloty this could be lung cancer  - we absolultey need the scan fom [facility]; not just report but the CD rom of CT chest  - see if you can get it to [place] next 2 weeks if not repeat ct chest by Aug 28, 2011 (3rd months CT)  #Pulmonary fibrosis  - do not know why but acid reflux could have played a role  - at this point we will follow this - you need breathing test in 6 months or so .  - we will also use the CT chest results for the nodule to follow the pulmonary fibrosis  #Shortness of breath  - both due to heart and lung issues  - between  the two the heart is more important priority - Follow Dr Terry Peck advise  - I will check with your cardiologist about suitability to attend [facility] rehab [facility]    OV 09/10/2011    Followup pulmonary nodule, ILD non-UIP pattern NOS and dyspnea due to ILD and Systolic CHF: Last visit was 2 months ago. In terms of nodule, we have been unable to get the actual cd rom from [facility]. So, he will need fu testing here. In terms of pulm fibrosis, Bx Plans placed on hold due to cardiac status at time of last ov. I learn from 08/01/11 Dr Terry Peck cards notes that plan is for continued medical mgmt. His dyspnea is improved a lot due to great med mgmt and bnp 80s as of 08/01/11. He is able to mow yard now. He thinks dietary portion control and resultant weight loss has also helped with dyspnea. #lung nodule  - there are 2 spots in lung and there is possibiloty this could be lung cancer  -  because we could not get scan from [facility], please do CT chest wihtout contrast now  #Pulmonary fibrosis  - do not know why but acid reflux could have played a role  - repeat anca blood test because this was trace positive and will help [place] know if this played a role  - depending on findings on CT chest, might have to consider lung biopsy  - you need breathing test in 6 months or so .  #Shortness of breath  - both due to heart and lung issues; glad you are better  - between the two- the heart is more important priority- Follow Dr Terry Peck advise  - Dr Terry Peck gave okay and will refer you to pulmonary rehab  #Overweight  - follow [facility] Diet sheet I discussed with you; stick to foods in left lane    Phne call 09/13/11:    ANCA blood test repeat result negative    CT scan: - lung nodule report - nodule smaller than before; & scarring or fibrosis in lung - stable as before.   OV 01/09/2012    Dyspnea: Did 12 visits with rehab that helped immensely with dyspnea. Then $ issues. Walking at home 4mles non-stop at home with  wife with o2 holding. PFT 01/09/12 - fvc 3.11/74%, fev1 2.6L/93% ratipo 84, TLC 73%, DLCO 11/53%. Of note, the FVC is significantly better than feb 2013 (? Weight loss effect) but dlco is same. In terms of weight: lost 9# on [facility] low glycemic diet (6# since M[facility] 2013) . Tolerating diet well. No headaches. No hunger pangs. GERD resolved. Soft stools ++. Overall this makes him feel better despite fact echo 12/19/11 is unchangd with LOW Systolic EF 356%and elevated PASP 377m Hg -- EF conitnues to be low and he has been referred for pacemaker. #weight  - continue low glycemic diet  - as you continue to lose weight, talk to pcp JaCathlean CowerMD about slowly coming off diabetic drugs  #lung nodule  - next ct chest is May 2014  #Lung fibrosis and shortness of breath  - glad shortness of breath better  - contnue exercise and diet  - glad you had flu shot  - will hold off lung biopsy conversation due to risk of bx with EF 35%; atleast till EP eval complete or there is evidence of progression on PFT    OV 09/17/2012:      Had pacer October 2013: and since then dyspnea significantly better/ Also had ECHO 07/28/12: shows EF improved to 55% and shows some LVH. After above, not having dyspnea for yard work. Able to do all yard work without sitting. Before was sitting 2-3 times. Currently no dyspnea or cough. Feels good.    PFT 01/09/12 - fvc 3.11/74%, fev1 2.6L/93% ratipo 84, TLC 73%, DLCO 11/53%    PFT 09/16/2012. FVC 2.9 L/ 70%. FEV1 2.5 L/93%. Ratio is 86/118%. Total lung capacity is 4.4 L/70%. DLCO is reduced at 40.7/54%. Essentially shows restriction with low diffusion    EKG Jan 2014: Qtc > 500 msec    CT chest 08/26/2012: My impression is UIP> IMPRESSION: No change in subpleural reticular and cystic changes as above, which may be seen with usual interstitial pneumonitis, idiopathic pulmonary fibrosis, or hypersensitivity pneumonitis.  Stable 6 mm left lower lobe pulmonary nodule, no persistent  nodularity to the previously seen subpleural area of left upper lobe ground-glass airspace opacity.     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 Age > [age]ith subpleural, bibasal  densities and in my opinion basal honey combing as well. Autoimmune is negative. Dyspnea resolved/improved significantly after pacer. I explained the disease and diagnosis to him. Explained, that high odds of IPF and diseaes is progressive, and ultimatly fatal and could be debilitating. Recommended that we screen him for pirfenidone via [employer] He is in agreement. He seems to meet criteria for the EAP program except Jan 2014 EKG showed long Qtc. Patient is interested.    ==>Ultimately he was not a candidate for Pirfenidone due to his cardiac problems.        ~  November 30, 2014:  Pulmonary consult w/ SN>        Terry Peck was recently [facility] 7/27- 11/18/14 by [facility] due to severe anemia; he states he developed recurrent SOB in mid-July and eval by PCP showed Hg=6; prior to this he was mowing his yard & stable; he denied any bleeding or change in stools; hosp eval revealed low MCV, Fe=10 (2%sat), Ferritin=4 (this suggests long slow ooze), B12=174, Folate=32; stool check in hosp was said to be NEG for blood...  He was supposed to be on Iron supplement but had stopped this about 12yrbefore;  He is followed for GI by DrJacobs- had EGD 09/06/14 showing 3-4cmHH & Barrett's epithelium (Bx= no dysplasia or malignancy); he has been taking Prilosec20Bid regularly;  Last colonoscopy was 07/18/11 showing 2 sessile polyps- one hyperplastic, one tub adenoma & f/u planned 59yr.. He was disch on FeSO4 Tid => Breathing is OK at rest, DOE w/ walking/ mowing=> now improved since transfusion; he denies cough, sputum/ hemoptysis; he denies CP/ palpit/ edema;  Also needs oral B12 supplement daily w/ f/u B12 level per protocol...       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      Current Meds> ASA81, Coreg25Bid, Lasix40, Atorva40, Metform850Tid, Actos45, Lantus44, Omep20Bid... EXAM reveals Afeb, VSS, O2sat=93% on RA at rest;  HEENT- neg, mallampati2;  Chest- bibasilar rales ~1/2 way up, no wheezing or rhonchi;  Heart- RR gr 1/6 SEM w/o r/g;  Abd soft, neg;  Ext- neg w/o c/c/e...  2DEcho 10/2014>  modLVH w/ incr wall thickness, EF=50-55% & no regional wall motion abn, AoV mod calcif & thickened w. Mild AS, MV is wnl; RV function normal, PAsys~3425m...  CXR 11/17/14 showed norm heart size, AICD on left, bibasilar fibrosis vs atx sl incr from prev...  Spirometry 11/30/14 showed FVC=2.24 (54%), FEV1=1.97 (62%), %1sec=88, mid-flows are wnl at 125% predicted... (Vol & flow are both reduced from prev).  Ambulatory oxygen saturation test 11/30/14> O2sat=94% on RA at rest w/ pulse=60/min; he ambulated 2 laps & stopped w/ dyspnea; lowest O2sat=87% w/ pulse of 80/min...   CT Chest 12/01/14 showed LUL ground-glass nodule is less prominent, LLL 6mm30mdule is unchanged, chronic ILD which appears slightly progressed in the periphery of both lungs; heart size is normal, extensive coronary artery calcification & AICD in place...  LABS 11/30/14 showed Hg=9.9 I reviewed [facility] scans, PFTs, and Labs> and discussed w/ pt & wife face-to-face during our 60mi81mpt... IMP/PLAN>>  Juniper's recent incr in SOB was clearly due to his severe anemia which in turn was likely related to a slow  ooze from his HH/ Barrett's mucosa and the fact that he stopped his oral iron therapy ~15yrago;  He is feeling sl better after the 2uTx & oral iron Rx w/ Hg up tp ~10 now;  He is asked to stay on the Fe Bid & B12 10069m/d... Having noted this & the recent Cards f/u indicating  stable status- his 2y52yru CT Chest and spirometry does indicate some progression of his ILD... I am in favor of considering some treatment for his pulm fibrosis- he was deemed not a candidate for pirfenedone due to his cardiac problems & AICD;  I outlined 3 options to the pt & his wife>  1) Conservative approach- continue observation to see how his dypnea improves w/ Fe & an exercise program;  2) Middle-of-the-road approach- perhaps considering an ICS like Pulmicort;  3) Aggressive approach- revisit eligibility for Pirfenedone vs trial of oral Pred... They clearly lean in the conservative direction & Terry Peck not want additional meds at this time; he wants to wait until his Hg has ret to normal & to see how his exercise program progresses... We plan ROV in 3 mo w/ CXR.  ~  March 02, 2015:  533mo133mo w/ f/u CXR> as outlined above Terry Peck been taking the FeSO4 Tid and B12 supplement regularly; he says he feels considerably better & claims his breathing is improved, overall doing well- notes DOE w/ exertion (eg- digging a bif hole to transplant schrubs), states he mows ok, walks ok, stairs ok, etc; he has O2 concentrator at home & hasn't needed to use it he says- it's a $23 copay per month & he wants to get rid of it if poss;(we discussed checking an ONO first)... He saw CARDS- DrNahser 02/17/15 for f/u HBP, SysCHF w/ EF=30-35%, LBBB, Bi-V ICD in place, HL, DM> Myoview had EF=29%, global LV dil, no ischemia; meds adjusted- on Coreg25Bid, Lasix40, ASA81; f/u LVF normalized on the Bi-V pacer=> this is checked by DrAllred for EP...     EXAM reveals Afeb, VSS, O2sat=94% on RA at rest;  HEENT- neg, mallampati2;  Chest- bibasilar rales ~1/2 way up, no wheezing or rhonchi;  Heart- RR gr 1/6 SEM w/o r/g;  Abd soft, neg;  Ext- neg w/o c/c/e...  CXR 03/02/15>  Mild cardiomeg, pacer stable, prominent interstitial markings/fibrosis- similar to prior, no change...  LABS 03/02/15>  CBC- improved w/ Hg=15, Fe=262 (62%sat),  Ferritin=16;  B12 >1500... REC to decr Fe to one daily & decr B12 to 1/2 tab daily...  IMP/PLAN>>  DaviCarlisimproved, asked to decr the Fe & B12 as above; we will check ONO to assess need for continued Home oxygen; ROV planned 54mo.554mo ~  October 18, 2015:  7-33mo R41mo I note that Terry Peck had the ONO done as requested after his last visit; His PCP- DrJohn had [facility] pick it up & pt feels that he is doing satis w/o the oxygen- denies cough, sput, SOB, CP, edema, etc; he notes no deterioration in his breathing- DOE w/ exertion as before; NOTE> he has a pulse-ox monitor & says that his O2 drops w/ exercise, getting as low as 85% on RA w/ mod exertion, "but it returns to normal quickly after rest;  He says 94-95% sat at rest on RA, and 88-89% w/ mowing/ walking;  He again denies CP/ palpit/ dizzy, edema & followed by DrNahser & DrAllred as noted (they have asked him to lim his sodium intake)...    EXAM  reveals Afeb, VSS, O2sat=94% on RA at rest;  HEENT- neg, mallampati2;  Chest- bibasilar rales ~1/2 way up, no wheezing or rhonchi;  Heart- RR gr 1/6 SEM w/o r/g;  Abd soft, neg;  Ext- neg w/o c/c/e...  Ambulatory Oximetry 10/18/15>  O2sat=94% on RA at rest w/ pulse=66/min;  He ambulated 2 laps in office (185' ea) w/ lowest O2sat=86% w/ pulse=83/min...   Overnight Oximetry> ordered again, we were never notified of the results, finally contacted [facility] & told he desaturated to <88% on RA for 133mn of the overnight study=> restart O2 at 2L/min Qhs... IMP/PLAN>>  Pt is resistent to the idea of home oxygen but his studies indicate need for NOCTURNAL O2 at 2L/min AND ambulatory O2 w/ exertion; he will capitulate to the former but declines O2 w/ exercise believing that he is stable & doing satis w/ quick recovery after exercise...   ~  February 16, 2016:  41moOV & pulmonary follow up visit> DaRohaileturns and reports feeling well- no new complaints or concerns; he is using his O2 at 2L/min flow Qhs, but doesn't  think he needs it during the day w/ usual activities; he says that his breathing is good, denies cough/ sput/ hemoptysis/ ch in DOE/ CP/ palpit/ dizzy/ ede4m68m.     He saw PCP-DrJohn 11/09/15> doing satis, Labs were ok w/ BS=133, but A1c=8.6 and his Lantus was increased; on Lantus40, Metform850Tid, Actos45    He had CARDS f/u DrNahser 02/13/16> HBP, LBBB, SysCHF w/ EF=30-35% that normalized w/ Bi-V pacer, HL-- last 2DEcho 7/16 showed EF=50-55%; Pt stopped his Lip40 believing that this caused his Glucose to go up; he has since restarted Atorva20 & will check w/ DrJohn for f/u FLP...     He saw EP clinic, DrAllred & NP 02/02/16> reported doing well, norm ICD function confirmed,  EXAM reveals Afeb, VSS, O2sat=95% on RA at rest;  HEENT- neg, mallampati2;  Chest- bibasilar rales ~1/2 way up, no wheezing or rhonchi;  Heart- RR gr 1/6 SEM w/o r/g;  Abd soft, neg;  Ext- neg w/o c/c/e...  CXR 02/16/16>  Norm heart size, ICD well positioned on the left, bilat interstitial thickening is stable, unchanged, NAD... IMP/PLAN>>  Terry Peck stoic but stable, reports breathing satis w/o recent exac etc..recommended to continue same, stay active, rov in 60mo22moner if needed prn...   ~  August 16, 2016:  60mo 14mo& Terry Peck w/o complaints, feeling well, & denies any problems currently;  He says his breathing is good & he denies cough, sput, hemoptysis or SOB- he does admit to DOE "with heavy exertion" (he push mows the yard & stops 1/2 way thru to rest, then proceeds);  He does have some NOCTURNAL & exercise induced hypoxemia- he wears O2 at 2L/min Qhs but says he doesn't need it during the day... we reviewed the following medical problems during today's office visit >>     Hx dyspnea, ILD- pulm fibrosis, restrictive lung physiology>  He [facility]- pt decided against pirfenidone & he didn't qualify for the study due to his CHF; he was followed by his PCP until referral 11/2014=> see above- he  chose conservative approach & did not want additional meds; his ILD has been slowly progressive but he has remained stoic & denies symptoms of cough or dyspnea, but breathing did improve w/ treatment of his anemia (Fe-Tid & oral B12 supplement), and w/ Pulm Rehab x several months in 2017 but too $$ he said...Marland KitchenMarland Kitchen  Nocturnal hypoxemia and exercise induced hypoxemia> as noted he uses nocturnal O2 at 2L/min, but only uses exercise O2 when he feels he needs it & he checks w/ home oximeter; Note: PAsys ~34 on last 2DEcho (7/16).    Hx small lung nodules on CT Chest> this was found & followed at the [place]; he has had several CT Chest scans here- last 11/2014 & summary shows sl progressive peripheral interstitial dis over time, underlying paraseptal emphysema & bilat LL bronchiectasis, no adenopathy, stable 26m LLL nodule & an area in LUL w/ GGO that appears less prominent serially; incidental findings included atherosclerotic ao, coronary calcif, gallstones, renal cyst...    Ex-smoker> min smoking hx, only smoked for 10 yrs, and quit 1990.    HBP>  Controlled on meds- ASA81, Coreg25Bid & Lasix40/d;  BP= 120/80 and he denies CP, palpit, SOB, dizzy, or edema...    Cardiomyopathy, chr systolic CHF, LBBB, BiV pacer> followed by DrNahser & DrAllred, SysCHF w/ EF=30-35% that normalized w/ Bi-V pacer- last 2DEcho 7/16 showed EF=50-55%...    IDDM> [facility] now followed by PCP-DrJJohn on Lantus40u, Metform850Tid, Actos45; LABS 10/2015 showed FBS=133 & A1c=8.6;  f/u LABS today showed BS=137, A1c=6.7 => he is rec to STOP the Actos & continue the Lantus40 & Metform Tid + low carb diet...     HL> on Atorva20 & Omega-3 fish oil; last FLP 06/2016 showed TChol 104, TG 100, HDL 42, LDL 42-- rec to continue same meds + diet....    HH/ Barrett's esoph, colon polyp> followed for GI by DrJacobs- had EGD 09/06/14 showing 3-4cmHH & Barrett's epithelium (Bx= no dysplasia or malignancy)- he has been taking Prilosec20Bid  regularly; Last colonoscopy was 07/18/11 showing 2 sessile polyps- one hyperplastic, one tub adenoma & f/u planned 570yr..    DJD> aware, on Advil, Robaxin, Tylenol#3 as needed...    [facility] w/ dypnea & Hg=6, iron deficient (Ferritin=4) & low B12 (174)-- presumed slow ooze from his large HH, no direct source found;  Hg & Fe ret to normal on oral supplement, same for B12;  Currently taking FeSO4- '325mg'$ /d, and B12- 100028md- continue same. EXAM reveals Afeb, VSS, Wt down 7# to 183#; O2sat=93% on RA at rest;  HEENT- neg, mallampati2;  Chest- bibasilar rales ~1/2 way up, no wheezing or rhonchi;  Heart- RR gr 1/6 SEM w/o r/g;  Abd soft, neg;  Ext- neg w/o c/c/e...  Ambulatory Oximetry 08/16/16>  O2sat=90% on RA at rest w/ pulse=75/min;  He ambulated 2 Laps in the office (185'ea) & lowest O2sat=83% w/ pulse=92/min;  He is rec to use O2 w/ all activities and ambulation...   LABS 08/16/16>  Chems- ok w BS=137, A1c=6.7, Cr=0.99, BNP=39;  CBC- wnl w/ Hg=15.5, mcv=95;  Fe=87 (19%sat), Ferritin=18.7 (22-322)... IMP/PLAN>>  DavSabastien 45o & has slowly progressive IPF along w/ NICM, IDDM, and hx IDA;  He has twice declined consideration of anti-fibrotic therapy, and he is quite stoic- continuing to deny syptoms in the face of slowly progressive fibrosis on CXR/scans, and hypoxemia w/ exercise;  He is rec to use the O2 w/ all activities and qhs (ok to take it off at rest 7 he monitors O2sats w/ his home monitor (all>90%);  We will proceed w/ a f/u 2DEcho to recheck EF & PAsys;  DM is improved w/ A1c=6.7 & he is rec to STOP the Actos in light of his cardiac dis;  Finally his Hg is 15 on FeSO4 one daily- rec  to continue w/ his Ferritin level...  ADDENDUM>>  2DEcho 09/07/16>>  Severe focal basilar hypertrophy & mild concentric hypertrophy, norm LVF w/ EF=60-65% w/ norm wall motion; Gr1DD; AoV mildly calcif & no AS, no AI; MV- norm; Atria/ RV/ PA- all wnl...    Past Medical History:  Diagnosis Date  .  Abnormal ANCA test 07/03/2011  . Allergy   . Arthritis    "mild; right shoulder" (01/29/2012)  . B12 deficiency anemia   . Barrett's esophagus 11/23/2010  . Bundle branch block, left   . Cataract    L eye surgery  . CHF (congestive heart failure) ([place])    pacemaker  . Chronic systolic dysfunction of left ventricle    EF 30%  . Colon polyps 11/23/2010  . GERD (gastroesophageal reflux disease)   . Hyperlipidemia 10/14/2012  . Hypertension   . ICD (implantable cardiac defibrillator) in place   . Iron deficiency anemia 05/30/2011  . Liver abscess 1980's  . Nonischemic cardiomyopathy ([place])    s/p St. Jude BiV ICD 01/29/12  . Pulmonary fibrosis ([place])   . Pulmonary nodules   . Shortness of breath    "related to heart being out of rhythm" (01/29/2012)  . Type II diabetes mellitus ([place])     Past Surgical History:  Procedure Laterality Date  . abscess drained  ?1980's   liver  . BI-VENTRICULAR IMPLANTABLE CARDIOVERTER DEFIBRILLATOR N/A 01/29/2012   Procedure: BI-VENTRICULAR IMPLANTABLE CARDIOVERTER DEFIBRILLATOR  (CRT-D);  Surgeon: Thompson Grayer, MD;  Location: [facility] CATH LAB;  Service: Cardiovascular;  Laterality: N/A;  . CARDIAC DEFIBRILLATOR PLACEMENT  01/29/2012   SJM Quadra Assura BiV ICD implanted by Dr Rayann Heman  . CATARACT EXTRACTION Left   . COLONOSCOPY    . TONSILLECTOMY  1952  . UPPER GI ENDOSCOPY      Outpatient Encounter Prescriptions as of 08/16/2016  Medication Sig  . acetaminophen-codeine (TYLENOL #3) 300-30 MG tablet Take 1 tablet by mouth every 8 (eight) hours as needed for moderate pain.  Marland Kitchen aspirin EC 81 MG tablet Take 81 mg by mouth daily.  . BD PEN NEEDLE NANO U/F 32G X 4 MM MISC USE ONE  ONCE DAILY  . Calcium-Magnesium-Zinc 1000-400-15 MG TABS Take 1 tablet by mouth daily.   . carvedilol (COREG) 25 MG tablet TAKE ONE TABLET BY MOUTH TWICE DAILY WITH MEALS  . Cholecalciferol (VITAMIN D) 1000 UNITS capsule Take 1,000 Units by mouth 3 (three) times daily.   . Cyanocobalamin  (B-12 PO) Take 1 tablet by mouth daily.   . ferrous sulfate 325 (65 FE) MG EC tablet Take 325 mg by mouth daily with breakfast.  . furosemide (LASIX) 40 MG tablet TAKE ONE TABLET BY MOUTH ONCE DAILY  . ibuprofen (ADVIL,MOTRIN) 200 MG tablet Take 200-300 mg by mouth 2 (two) times daily as needed. For pain  . LANTUS SOLOSTAR 100 UNIT/ML Solostar Pen INJECT 40 UNITS SUBCUTANEOUSLY ONCE DAILY  . Lutein 20 MG CAPS Take 20 mg by mouth daily.  . metFORMIN (GLUCOPHAGE) 850 MG tablet TAKE ONE TABLET BY MOUTH THREE TIMES DAILY  . methocarbamol (ROBAXIN) 500 MG tablet TAKE ONE TABLET BY MOUTH ONCE DAILY AS NEEDED  . Multiple Vitamin (MULTIVITAMIN WITH MINERALS) TABS Take 1 tablet by mouth daily.  . OMEGA 3 1000 MG CAPS Take 1,000 mg by mouth 2 (two) times daily.   Marland Kitchen omeprazole (PRILOSEC) 20 MG capsule Take 1 capsule (20 mg total) by mouth 2 (two) times daily. Yearly physical w/labs are due in July must see MD  for refills  . pioglitazone (ACTOS) 45 MG tablet Take 1 tablet (45 mg total) by mouth at bedtime. Yearly physical w/labs is due in July must see Md for refills  . atorvastatin (LIPITOR) 20 MG tablet Take 1 tablet (20 mg total) by mouth daily.   No facility-administered encounter medications on file as of 08/16/2016.     Allergies  Allergen Reactions  . Penicillins Anaphylaxis and Other (See Comments)    "swelling of eyes; throat; could breath good" (01/29/2012)  . Iodine Other (See Comments)    "if I get iodine dye, I'll get a fever" (01/29/2012)  . Fish Oil Itching    Pt said only when given through IV    Immunization History  Administered Date(s) Administered  . Influenza Split 01/03/2012  . Influenza Whole 05/03/2011  . Influenza, High Dose Seasonal PF 01/17/2016  . Influenza,inj,Quad PF,36+ Mos 03/24/2013, 12/09/2013, 03/02/2015  . Pneumococcal Conjugate-13 10/21/2008, 04/14/2013  . Pneumococcal Polysaccharide-23 01/03/2012  . Tdap 11/23/2010  . Zoster 04/14/2013    Current  Medications, Allergies, Past Medical History, Past Surgical History, Family History, and Social History were reviewed in [employer] record.   Review of Systems            All symptoms NEG except where BOLDED >>  Constitutional:  F/C/S, fatigue, anorexia, unexpected weight change. HEENT:  HA, visual changes, hearing loss, earache, nasal symptoms, sore throat, mouth sores, hoarseness. Resp:  cough, sputum, hemoptysis; SOB, tightness, wheezing. Cardio:  CP, palpit, DOE, orthopnea, edema. GI:  N/V/D/C, blood in stool; reflux, abd pain, distention, gas. GU:  dysuria, freq, urgency, hematuria, flank pain, voiding difficulty. MS:  joint pain, swelling, tenderness, decr ROM; neck pain, back pain, etc. Neuro:  HA, tremors, seizures, dizziness, syncope, weakness, numbness, gait abn. Skin:  suspicious lesions or skin rash. Heme:  adenopathy, bruising, bleeding. Psyche:  confusion, agitation, sleep disturbance, hallucinations, anxiety, depression suicidal.   Objective:   Physical Exam      Vital Signs:  Reviewed...   General:  WD, WN, 76 y/o WM in NAD; alert & oriented; pleasant & cooperative... HEENT:  [place]/AT; Conjunctiva- pink, Sclera- nonicteric, EOM-wnl, PERRLA, EACs-clear, TMs-wnl; NOSE-clear; THROAT-clear & wnl.  Neck:  Supple w/ fair ROM; no JVD; normal carotid impulses w/o bruits; no thyromegaly or nodules palpated; no lymphadenopathy.  Chest:  decr BS bilat w/ velcro rales bilat ~1/2 way up; no wheezing or rhonchi, no signs of consolidation... Heart:  AICD on left, Regular Rhythm; gr 1/6 SEM w/o rubs or gallops detected. Abdomen:  Soft & nontender- no guarding or rebound; normal bowel sounds; no organomegaly or masses palpated Ext:  decrROM; without deformities +arthritic changes; no varicose veins, venous insuffic, or edema;  Pulses intact w/o bruits. Neuro:  No focal neuro deficits; sensory testing normal; gait normal & balance OK. Derm:  No lesions noted; no rash  etc. Lymph:  No cervical, supraclavicular, axillary, or inguinal adenopathy palpated.   Assessment:      IMP >>     Multifactorial dyspnea -- fibrosis, CHF, anemia, deconditioning all playing a roll     Pulmonary fibrosis -- sl progressive per 30yrCT follow up (see below)    Pulmonary nodules -- stable on recent CT Chest    NICM w/ chr sys CHF, LBBB -- per DrNasher, stable    AICD placed -- stable    Anemia- Iron defic & low B12 -- likely slow ooze from HH/ Barrett's & absorption issues -- rec FeSO4 Bid and B12 10037m/d  regularly, stay on PPI Bid as well...    Other medical problems as noted> HL, IDDM (watch Actos rx w/ CHF diagnosis)  PLAN >>  11/30/14>   Cayman's recent incr in SOB was clearly due to his severe anemia which in turn was likely related to a slow ooze from his HH/ Barrett's mucosa and the fact that he stopped his oral iron therapy ~43yrago;  He is feeling sl better after the 2uTx & oral iron Rx w/ Hg up tp ~10 now;  He is asked to stay on the Fe Bid & B12 10071m/d... Having noted this & the recent Cards f/u indicating stable status- his 2y20yru CT Chest and spirometry does indicate some progression of his ILD... 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 They clearly lean in the conservative direction & DavPhilopateeres not want additional meds at this time; he wants to wait until his Hg has ret to normal & to see how his exercise program progresses... We plan ROV in 3 mo w/ CXR. 03/02/15>   DavAlexis improved, asked to decr the Fe & B12 as above; we will check ONO to assess need  for continued Home oxygen; ROV planned 39mo33mo 10/18/15>   Pt is resistent to the idea of home oxygen but his studies indicate need for NOCTURNAL O2 at 2L/min AND ambulatory O2 w/ exertion; he will capitulate to the former but declines O2 w/ exercise believing that he is stable & doing satis w/ quick recovery after exercise...  02/16/16>   Pt reports stable, no new complaints or concerns... 08/16/16>   DaviShanon Browy58 & has slowly progressive IPF along w/ NICM, IDDM, and hx IDA;  He has twice declined consideration of anti-fibrotic therapy, and he is quite stoic- continuing to deny syptoms in the face of slowly progressive fibrosis on CXR/scans, and hypoxemia w/ exercise;  He is rec to use the O2 w/ all activities and qhs (ok to take it off at rest 7 he monitors O2sats w/ his home monitor (all>90%);  We will proceed w/ a f/u 2DEcho to recheck EF & PAsys;  DM is improved w/ A1c=6.7 & he is rec to STOP the Actos in light of his cardiac dis;  Finally his Hg is 15 on FeSO4 one daily- rec to continue w/ his Ferritin level.     Plan:     Patient's Medications  New Prescriptions   No medications on file  Previous Medications   ACETAMINOPHEN-CODEINE (TYLENOL #3) 300-30 MG TABLET    Take 1 tablet by mouth every 8 (eight) hours as needed for moderate pain.   ASPIRIN EC 81 MG TABLET    Take 81 mg by mouth daily.   ATORVASTATIN (LIPITOR) 20 MG TABLET    Take 1 tablet (20 mg total) by mouth daily.   BD PEN NEEDLE NANO U/F 32G X 4 MM MISC    USE ONE  ONCE DAILY   CALCIUM-MAGNESIUM-ZINC 1000-400-15 MG TABS    Take 1 tablet by mouth daily.    CARVEDILOL (COREG) 25 MG TABLET  TAKE ONE TABLET BY MOUTH TWICE DAILY WITH MEALS   CHOLECALCIFEROL (VITAMIN D) 1000 UNITS CAPSULE    Take 1,000 Units by mouth 3 (three) times daily.    CYANOCOBALAMIN (B-12 PO)    Take 1 tablet by mouth daily.    FERROUS SULFATE 325 (65 FE) MG EC TABLET    Take 325 mg by mouth daily with breakfast.   FUROSEMIDE (LASIX) 40 MG TABLET    TAKE ONE  TABLET BY MOUTH ONCE DAILY   IBUPROFEN (ADVIL,MOTRIN) 200 MG TABLET    Take 200-300 mg by mouth 2 (two) times daily as needed. For pain   LANTUS SOLOSTAR 100 UNIT/ML SOLOSTAR PEN    INJECT 40 UNITS SUBCUTANEOUSLY ONCE DAILY   LUTEIN 20 MG CAPS    Take 20 mg by mouth daily.   METFORMIN (GLUCOPHAGE) 850 MG TABLET    TAKE ONE TABLET BY MOUTH THREE TIMES DAILY   METHOCARBAMOL (ROBAXIN) 500 MG TABLET    TAKE ONE TABLET BY MOUTH ONCE DAILY AS NEEDED   MULTIPLE VITAMIN (MULTIVITAMIN WITH MINERALS) TABS    Take 1 tablet by mouth daily.   OMEGA 3 1000 MG CAPS    Take 1,000 mg by mouth 2 (two) times daily.    OMEPRAZOLE (PRILOSEC) 20 MG CAPSULE    Take 1 capsule (20 mg total) by mouth 2 (two) times daily. Yearly physical w/labs are due in July must see MD for refills   PIOGLITAZONE (ACTOS) 45 MG TABLET    Take 1 tablet (45 mg total) by mouth at bedtime. Yearly physical w/labs is due in July must see Md for refills  Modified Medications   No medications on file  Discontinued Medications   No medications on file

## 2016-08-16 NOTE — Patient Instructions (Addendum)
Today we updated your med list in our EPIC system...    Continue your current medications the same...    I think you will be able to get off the ACTOS once your follow up blood tests return...  Today we checked an ambulatory oximetry test, and some follow up blood work...    We will contact you w/ the results when available...   Stay as active as possible...  Call for any questions...  Let's plan a follow up visit in 50mo, sooner if needed for problems...  NOTE>  The ambulatory Oxygen test shows that you dropped your sats <88% w/ 2 laps in the office & you should use your portable OXYGEN w/ similar exercise at home.Marland KitchenMarland Kitchen

## 2016-08-17 ENCOUNTER — Ambulatory Visit (INDEPENDENT_AMBULATORY_CARE_PROVIDER_SITE_OTHER): Payer: Medicare Other | Admitting: Cardiovascular Disease

## 2016-08-17 ENCOUNTER — Encounter: Payer: Self-pay | Admitting: Cardiovascular Disease

## 2016-08-17 VITALS — BP 140/88 | HR 60 | Ht 68.0 in | Wt 168.0 lb

## 2016-08-17 DIAGNOSIS — E0859 Diabetes mellitus due to underlying condition with other circulatory complications: Secondary | ICD-10-CM | POA: Diagnosis not present

## 2016-08-17 DIAGNOSIS — I5022 Chronic systolic (congestive) heart failure: Secondary | ICD-10-CM | POA: Diagnosis not present

## 2016-08-17 DIAGNOSIS — I447 Left bundle-branch block, unspecified: Secondary | ICD-10-CM | POA: Diagnosis not present

## 2016-08-17 MED ORDER — LOSARTAN POTASSIUM 50 MG PO TABS: 50.0000 mg | ORAL_TABLET | Freq: Every day | ORAL | 3 refills | Status: DC

## 2016-08-17 NOTE — Patient Instructions (Signed)
Medication Instructions:  START Losartan 50 mg once daily   Labwork: Your physician recommends that you return for lab work in: 3 weeks for basic metabolic panel   Testing/Procedures: None Ordered   Follow-Up: Your physician wants you to follow-up in: 6 months with Dr. Acie Fredrickson.  You will receive a reminder letter in the mail two months in advance. If you don't receive a letter, please call our office to schedule the follow-up appointment.   If you need a refill on your cardiac medications before your next appointment, please call your pharmacy.   Thank you for choosing [facility]! Christen Bame, RN [phone]

## 2016-08-17 NOTE — Progress Notes (Signed)
Terry Peck Date of Birth  07-03-1940       [address]    [place]    [place], [place]  [address]     [phone]  Fax  [phone]    Problem list: 1. Left bundle branch block 2. Diabetes mellitus 3. Hyperlipidemia 4. Hypertension 5. Congestive heart failure-EF equals 30-35% - has now 55%  with Bi-V pacer  6. Bi-V ICD placement 7. Idiopathic Pulmonary fibrosis    Previous notes:  Terry Peck is a 76 yo who presents for evaluation of dyspnea.  He has been found to have a LBBB and has CHF with an EF of 30-35%.  He gets dyspneac with exercise ( mowing the lawn).  He denies any PND or orthopnea. He describes generalized fatigue with exertion.  The shortness of breath has been present for about a year. He admits to eating a fair amount of salty foods. He eats hot dogs, ham, bacon, sausage. He does not use any exercise but instead uses potassium chloride ( No salt).  He was seen by his medical doctor and was found to have a left bundle branch block. and was referred here for further evaluation. It was at that point that he had an echocardiogram which revealed global left ventricular systolic dysfunction with an EF of 30-35%.  He had a stress Myoview study which reveals an ejection fraction 29%. There is global left ventricular dilatation. There is no evidence of ischemia. His symptoms have improved dramatically since we've stopped his verapamil and start him on carvedilol.  Continue to gradually titrate up his medications. We change his HCTZ to Lasix during his last visit. We also added lisinopril. He's feeling better.  He is able to do more of his normal activities without severe dyspnea.  December 10, 2011 He continues to gradually and steadily improved. He is now able to mow his lawn and edge  without any difficulties. He's doing quite well at cardiac rehabilitation to  October 17, 2012:  Terry Peck is doing great.  He has definitely seen a benefit from the BI-V pacer / ICD.  He's  able to do all of his yard work without significant difficulty or shortness of breath.  No chest pain.  We have not done a cardiac cath - he was not found to have significant ischemia on myoview and he has made tremendous progress with medical therapy for his CHF.   Echo in April , 2014 Study Conclusions  - Left ventricle: The cavity size was normal. Wall thickness was increased in a pattern of severe LVH. Systolic function was normal. The estimated ejection fraction was 55%. Wall motion was normal; there were no regional wall motion abnormalities. - Left atrium: The atrium was mildly dilated. - Atrial septum: No defect or patent foramen ovale was identified.  His LV EF has normalized with medical therapy and the ICD.   Dec. 2, 2014:  Terry Peck is doing well.  Exercising well.  No CP, no dyspnea.    November 02, 2013:  Terry Peck is doing well. He did not take his furosemide yesterday or today and so his blood pressure is a little bit higher. He takes his blood pressure on a regular basis at home and his readings are in the normal range.  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 13, 2016 Terry Peck is a 76 yo with hx of chronic systolic CHF.  He is  s/p Bi - V  ICD placement.   His EF has normalized since placement of the Bi-V ICD.  He is feeling much better. Exercising and doing all that he can do.   He now has a Optivol ( or equivalent)  Feature enabled .  Volume status is good.   November 01, 2014:  Has some DOE recently .  Very mild  Not eating any extra salt.   Oct. 27, 2016:  Terry Peck is doing well. Has normalized his LV function with the Bi-V pacer   August 16, 2015:  Terry Peck is seen back for follow up of his chf BP is elevated. Getting more normal readings at home  EF has normalized with his BI-V pacer   Oct. 23, 2017:   Doing well from a cardiac standpoint Busy doing yard work .  He had stopped his atorvastatin to see if that would  affect his blood glucose. -   His glucose was lower when he was off the statin . Lipids went up significantly .   August 17, 2016:  Terry Peck is doing well His Terry Peck showed some possible fluid accumulation but the impedance was normal .  He is feeling great.  No shortness of breath   Current Outpatient Prescriptions on File Prior to Visit  Medication Sig Dispense Refill  . acetaminophen-codeine (TYLENOL #3) 300-30 MG tablet Take 1 tablet by mouth every 8 (eight) hours as needed for moderate pain. 30 tablet 0  . aspirin EC 81 MG tablet Take 81 mg by mouth daily.    Marland Kitchen atorvastatin (LIPITOR) 20 MG tablet Take 1 tablet (20 mg total) by mouth daily. 90 tablet 3  . BD PEN NEEDLE NANO U/F 32G X 4 MM MISC USE ONE  ONCE DAILY 100 each 1  . Calcium-Magnesium-Zinc 1000-400-15 MG TABS Take 1 tablet by mouth daily.     . carvedilol (COREG) 25 MG tablet TAKE ONE TABLET BY MOUTH TWICE DAILY WITH MEALS 180 tablet 3  . Cholecalciferol (VITAMIN D) 1000 UNITS capsule Take 1,000 Units by mouth 3 (three) times daily.     . Cyanocobalamin (B-12 PO) Take 1 tablet by mouth daily.     . ferrous sulfate 325 (65 FE) MG EC tablet Take 325 mg by mouth daily with breakfast.    . furosemide (LASIX) 40 MG tablet TAKE ONE TABLET BY MOUTH ONCE DAILY 30 tablet 11  . ibuprofen (ADVIL,MOTRIN) 200 MG tablet Take 200-300 mg by mouth 2 (two) times daily as needed. For pain    . LANTUS SOLOSTAR 100 UNIT/ML Solostar Pen INJECT 40 UNITS SUBCUTANEOUSLY ONCE DAILY 3 mL 11  . Lutein 20 MG CAPS Take 20 mg by mouth daily.    . metFORMIN (GLUCOPHAGE) 850 MG tablet TAKE ONE TABLET BY MOUTH THREE TIMES DAILY 270 tablet 1  . methocarbamol (ROBAXIN) 500 MG tablet TAKE ONE TABLET BY MOUTH ONCE DAILY AS NEEDED 30 tablet 0  . Multiple Vitamin (MULTIVITAMIN WITH MINERALS) TABS Take 1 tablet by mouth daily.    . OMEGA 3 1000 MG CAPS Take 1,000 mg by mouth 2 (two) times daily.     Marland Kitchen omeprazole (PRILOSEC) 20 MG capsule Take 1 capsule (20 mg total) by  mouth 2 (two) times daily. Yearly physical w/labs are due in July must see MD for refills 180 capsule 0  . pioglitazone (ACTOS) 45 MG tablet Take 1 tablet (45 mg total) by mouth at bedtime. Yearly physical w/labs is due in July must see Md for refills 90 tablet  0  . [DISCONTINUED] simvastatin (ZOCOR) 80 MG tablet Take 1 tablet (80 mg total) by mouth at bedtime. 90 tablet 3   No current facility-administered medications on file prior to visit.     Allergies  Allergen Reactions  . Penicillins Anaphylaxis and Other (See Comments)    "swelling of eyes; throat; could breath good" (01/29/2012)  . Iodine Other (See Comments)    "if I get iodine dye, I'll get a fever" (01/29/2012)  . Fish Oil Itching    Pt said only when given through IV    Past Medical History:  Diagnosis Date  . Abnormal ANCA test 07/03/2011  . Allergy   . Arthritis    "mild; right shoulder" (01/29/2012)  . B12 deficiency anemia   . Barrett's esophagus 11/23/2010  . Bundle branch block, left   . Cataract    L eye surgery  . CHF (congestive heart failure) ([place])    pacemaker  . Chronic systolic dysfunction of left ventricle    EF 30%  . Colon polyps 11/23/2010  . GERD (gastroesophageal reflux disease)   . Hyperlipidemia 10/14/2012  . Hypertension   . ICD (implantable cardiac defibrillator) in place   . Iron deficiency anemia 05/30/2011  . Liver abscess 1980's  . Nonischemic cardiomyopathy ([place])    s/p St. Jude BiV ICD 01/29/12  . Pulmonary fibrosis ([place])   . Pulmonary nodules   . Shortness of breath    "related to heart being out of rhythm" (01/29/2012)  . Type II diabetes mellitus ([place])     Past Surgical History:  Procedure Laterality Date  . abscess drained  ?1980's   liver  . BI-VENTRICULAR IMPLANTABLE CARDIOVERTER DEFIBRILLATOR N/A 01/29/2012   Procedure: BI-VENTRICULAR IMPLANTABLE CARDIOVERTER DEFIBRILLATOR  (CRT-D);  Surgeon: Thompson Grayer, MD;  Location: [facility] CATH LAB;  Service: Cardiovascular;  Laterality: N/A;    . CARDIAC DEFIBRILLATOR PLACEMENT  01/29/2012   SJM Quadra Assura BiV ICD implanted by Dr Rayann Heman  . CATARACT EXTRACTION Left   . COLONOSCOPY    . TONSILLECTOMY  1952  . UPPER GI ENDOSCOPY      History  Smoking Status  . Former Smoker  . Packs/day: 0.50  . Years: 10.00  . Types: Cigarettes  . Quit date: 04/23/1988  Smokeless Tobacco  . Never Used    History  Alcohol Use No    Family History  Problem Relation Age of Onset  . Heart disease Mother   . Colon cancer Neg Hx   . Esophageal cancer Neg Hx   . Stomach cancer Neg Hx     Reviw of Systems:  Reviewed in the HPI.  All other systems are negative.  Physical Exam: Blood pressure 140/88, pulse 60, height 5\' 8"  (1.727 m), weight 168 lb (76.2 kg). General: Well developed, well nourished, in no acute distress.  Head: Normocephalic, atraumatic, sclera non-icteric, mucus membranes are moist,   Neck: Supple. Carotids are 2 + without bruits. No JVD  Lungs:  Few fine rales bilaterally, R>L.   Heart: regular rate.  normal  S1 S2.  2/6 systolic murmur   Abdomen: Soft, non-tender, non-distended with normal bowel sounds. No hepatomegaly. No rebound/guarding. No masses.  Msk:  Strength and tone are normal  Extremities: No clubbing or cyanosis. No edema.  Distal pedal pulses are 2+ and equal bilaterally.  Neuro: Alert and oriented X 3. Moves all extremities spontaneously.  Psych:  Responds to questions appropriately with a normal affect.  ECG: August 17, 2016:   AV Bi- V  pacing ,  HR 60   Assessment / Plan:   1. Left bundle branch block-  S/p Bi-V pacer ,  Doing well.  2. Diabetes mellitus 3. Hyperlipidemia -  He's been on atorvastatin 40 mg a day for some time. Levels look good   4. Hypertension - BP is well controlled.   5. Congestive heart failure-    His EF had improved from 30% to 55% by echo in 2016.       He is doing great.  Out doing yard work regularly . Will add Losartan 50 mg a day   We had some  discussion about whether or not he should be on Actos. At present he seems to be stable. I would suggest that if there is a medication that works as well or perhaps better than Actos and does not cause heart failure that we substitute the Actos for that medication. I will leave this up to Dr. Jenny Reichmann.   6. Bi-V ICD placement -   Followed by Dr. Rayann Heman.  Doing great.     Mertie Moores, MD  08/17/2016 9:03 AM    [address],  [place], [place]  [address] Pager [phone] Phone: [phone]; Fax: [phone]

## 2016-08-20 ENCOUNTER — Other Ambulatory Visit: Payer: Self-pay | Admitting: Internal Medicine

## 2016-08-21 ENCOUNTER — Other Ambulatory Visit: Payer: Self-pay | Admitting: Pulmonary Disease

## 2016-08-21 ENCOUNTER — Encounter: Payer: Self-pay | Admitting: Pulmonary Disease

## 2016-08-21 DIAGNOSIS — I110 Hypertensive heart disease with heart failure: Secondary | ICD-10-CM

## 2016-08-21 DIAGNOSIS — I5022 Chronic systolic (congestive) heart failure: Principal | ICD-10-CM

## 2016-08-21 NOTE — Telephone Encounter (Signed)
This message is for Judeen Hammans (Dr Jeannine Kitten nurse). Judeen Hammans, I copied your number down wrong and I can't call you. Please use MYCHART and communicate to me when and what appointments Dr Lenna Gilford wants me to take and communicate them to me via [facility]. Any day and any time is OK, I'm retired, except for [employer] and grocery shopping. Thanks, Dierdre Highman

## 2016-08-21 NOTE — Telephone Encounter (Signed)
Spoke to pt & gave him appt info for Echo that was sched at [facility] on 5/18 at 9:30.  Nothing further needed.

## 2016-09-06 DIAGNOSIS — J84112 Idiopathic pulmonary fibrosis: Secondary | ICD-10-CM | POA: Diagnosis not present

## 2016-09-06 DIAGNOSIS — J8410 Pulmonary fibrosis, unspecified: Secondary | ICD-10-CM | POA: Diagnosis not present

## 2016-09-06 DIAGNOSIS — I5022 Chronic systolic (congestive) heart failure: Secondary | ICD-10-CM | POA: Diagnosis not present

## 2016-09-06 DIAGNOSIS — I110 Hypertensive heart disease with heart failure: Secondary | ICD-10-CM | POA: Diagnosis not present

## 2016-09-07 ENCOUNTER — Other Ambulatory Visit: Payer: Self-pay

## 2016-09-07 ENCOUNTER — Other Ambulatory Visit: Payer: Medicare Other | Admitting: *Deleted

## 2016-09-07 ENCOUNTER — Ambulatory Visit (HOSPITAL_COMMUNITY): Payer: Medicare Other | Attending: Cardiology

## 2016-09-07 DIAGNOSIS — I5022 Chronic systolic (congestive) heart failure: Secondary | ICD-10-CM | POA: Diagnosis not present

## 2016-09-07 DIAGNOSIS — I361 Nonrheumatic tricuspid (valve) insufficiency: Secondary | ICD-10-CM | POA: Insufficient documentation

## 2016-09-07 DIAGNOSIS — I358 Other nonrheumatic aortic valve disorders: Secondary | ICD-10-CM | POA: Insufficient documentation

## 2016-09-07 DIAGNOSIS — I110 Hypertensive heart disease with heart failure: Secondary | ICD-10-CM

## 2016-09-07 DIAGNOSIS — I340 Nonrheumatic mitral (valve) insufficiency: Secondary | ICD-10-CM | POA: Diagnosis not present

## 2016-09-07 DIAGNOSIS — E0859 Diabetes mellitus due to underlying condition with other circulatory complications: Secondary | ICD-10-CM

## 2016-09-07 DIAGNOSIS — I272 Pulmonary hypertension, unspecified: Secondary | ICD-10-CM | POA: Insufficient documentation

## 2016-09-07 DIAGNOSIS — I422 Other hypertrophic cardiomyopathy: Secondary | ICD-10-CM | POA: Diagnosis not present

## 2016-09-07 LAB — BASIC METABOLIC PANEL
BUN / CREAT RATIO: 23 (ref 10–24)
BUN: 19 mg/dL (ref 8–27)
CHLORIDE: 100 mmol/L (ref 96–106)
CO2: 22 mmol/L (ref 18–29)
CREATININE: 0.84 mg/dL (ref 0.76–1.27)
Calcium: 9.6 mg/dL (ref 8.6–10.2)
GFR calc Af Amer: 98 mL/min/{1.73_m2} (ref >59)
GFR calc non Af Amer: 85 mL/min/{1.73_m2} (ref >59)
GLUCOSE: 93 mg/dL (ref 65–99)
POTASSIUM: 4.9 mmol/L (ref 3.5–5.2)
SODIUM: 141 mmol/L (ref 134–144)

## 2016-09-18 ENCOUNTER — Ambulatory Visit (INDEPENDENT_AMBULATORY_CARE_PROVIDER_SITE_OTHER): Payer: Medicare Other | Admitting: *Deleted

## 2016-09-18 DIAGNOSIS — Z95810 Presence of automatic (implantable) cardiac defibrillator: Secondary | ICD-10-CM | POA: Diagnosis not present

## 2016-09-18 DIAGNOSIS — I5022 Chronic systolic (congestive) heart failure: Secondary | ICD-10-CM | POA: Diagnosis not present

## 2016-09-18 NOTE — Progress Notes (Signed)
EPIC Encounter for ICM Monitoring  Patient Name: Terry Peck is a 76 y.o. male Date: 09/18/2016 Primary Care Physican: Biagio Borg, MD Primary Cardiologist:Nahser Electrophysiologist: Allred Dry Weight:unknown  Bi-V Pacing: >99%                                        Heart Failure questions reviewed, pt asymptomatic.   Thoracic impedance is normal.  Prescribed dosage Furosemide 40 mg 1 tablet daily  Labs: 09/07/2016 Creatinine 0.84, BUN 19, Potassium 4.9, Sodium 141 08/16/2016 Creatinine 0.99, BUN 24, Potassium 4.7, Sodium 143 07/17/2016 Creatinine 0.86, BUN 54, Potassium 4.8, Sodium 141 02/09/2016 Creatinine 0.79, BUN 15, Potassium 5.1, Sodium 140 11/09/2015 Creatinine 0.82, BUN 18, Potassium 4.1, Sodium 138, EGFR 97.26   Recommendations: No changes. Discussed to limit salt intake to 2000 mg/day and fluid intake to < 2 liters/day.  Encouraged to call for fluid symptoms or use local ER for any urgent symptoms.  Follow-up plan: ICM clinic phone appointment on 10/19/2016.    Copy of ICM check sent to device physician.   3 month ICM trend: 09/18/2016   1 Year ICM trend:      Rosalene Billings, RN 09/18/2016 2:00 PM

## 2016-09-21 LAB — CUP PACEART REMOTE DEVICE CHECK
Battery Remaining Percentage: 43 %
Brady Statistic AP VP Percent: 22 %
Brady Statistic AP VS Percent: 1 %
Brady Statistic AS VP Percent: 78 %
Brady Statistic AS VS Percent: 1 %
Brady Statistic RA Percent Paced: 22 %
Date Time Interrogation Session: 20180529084404
HighPow Impedance: 69 Ohm
HighPow Impedance: 69 Ohm
Implantable Lead Implant Date: 20131008
Implantable Lead Implant Date: 20131008
Implantable Lead Location: 753859
Lead Channel Impedance Value: 390 Ohm
Lead Channel Pacing Threshold Amplitude: 0.75 V
Lead Channel Pacing Threshold Pulse Width: 0.5 ms
Lead Channel Pacing Threshold Pulse Width: 0.5 ms
Lead Channel Pacing Threshold Pulse Width: 0.5 ms
Lead Channel Sensing Intrinsic Amplitude: 3.5 mV
Lead Channel Setting Pacing Amplitude: 1.75 V
Lead Channel Setting Pacing Amplitude: 2 V
Lead Channel Setting Pacing Pulse Width: 0.5 ms
MDC IDC LEAD IMPLANT DT: 20131008
MDC IDC LEAD LOCATION: 753858
MDC IDC LEAD LOCATION: 753860
MDC IDC MSMT BATTERY REMAINING LONGEVITY: 40 mo
MDC IDC MSMT BATTERY VOLTAGE: 2.92 V
MDC IDC MSMT LEADCHNL LV IMPEDANCE VALUE: 950 Ohm
MDC IDC MSMT LEADCHNL LV PACING THRESHOLD AMPLITUDE: 0.75 V
MDC IDC MSMT LEADCHNL RV IMPEDANCE VALUE: 390 Ohm
MDC IDC MSMT LEADCHNL RV PACING THRESHOLD AMPLITUDE: 0.875 V
MDC IDC MSMT LEADCHNL RV SENSING INTR AMPL: 11.4 mV
MDC IDC PG IMPLANT DT: 20131008
MDC IDC PG SERIAL: 7054987
MDC IDC SET LEADCHNL LV PACING AMPLITUDE: 2 V
MDC IDC SET LEADCHNL RV PACING PULSEWIDTH: 0.5 ms
MDC IDC SET LEADCHNL RV SENSING SENSITIVITY: 0.5 mV

## 2016-09-28 ENCOUNTER — Encounter: Payer: Self-pay | Admitting: Cardiology

## 2016-10-07 DIAGNOSIS — I110 Hypertensive heart disease with heart failure: Secondary | ICD-10-CM | POA: Diagnosis not present

## 2016-10-07 DIAGNOSIS — J84112 Idiopathic pulmonary fibrosis: Secondary | ICD-10-CM | POA: Diagnosis not present

## 2016-10-07 DIAGNOSIS — J8410 Pulmonary fibrosis, unspecified: Secondary | ICD-10-CM | POA: Diagnosis not present

## 2016-10-07 DIAGNOSIS — I5022 Chronic systolic (congestive) heart failure: Secondary | ICD-10-CM | POA: Diagnosis not present

## 2016-10-19 ENCOUNTER — Telehealth: Payer: Self-pay | Admitting: Cardiology

## 2016-10-19 ENCOUNTER — Ambulatory Visit (INDEPENDENT_AMBULATORY_CARE_PROVIDER_SITE_OTHER): Payer: Medicare Other

## 2016-10-19 DIAGNOSIS — Z95810 Presence of automatic (implantable) cardiac defibrillator: Secondary | ICD-10-CM | POA: Diagnosis not present

## 2016-10-19 DIAGNOSIS — I5022 Chronic systolic (congestive) heart failure: Secondary | ICD-10-CM | POA: Diagnosis not present

## 2016-10-19 NOTE — Telephone Encounter (Signed)
Spoke with pt and reminded pt of remote transmission that is due today. Pt verbalized understanding.   

## 2016-10-22 NOTE — Progress Notes (Signed)
EPIC Encounter for ICM Monitoring  Patient Name: Terry Peck is a 76 y.o. male Date: 10/22/2016 Primary Care Physican: Biagio Borg, MD Primary Cardiologist:Nahser Electrophysiologist: Allred Dry Weight:unknown  Bi-V Pacing: >99%      Heart Failure questions reviewed, pt asymptomatic.   Thoracic impedance normal.  Prescribed dosage Furosemide 40 mg 1 tablet daily  Labs: 09/07/2016 Creatinine 0.84, BUN 19, Potassium 4.9, Sodium 141 08/16/2016 Creatinine 0.99, BUN 24, Potassium 4.7, Sodium 143 07/17/2016 Creatinine 0.86, BUN 54, Potassium 4.8, Sodium 141 02/09/2016 Creatinine 0.79, BUN 15, Potassium 5.1, Sodium 140 11/09/2015 Creatinine 0.82, BUN 18, Potassium 4.1, Sodium 138, EGFR 97.26   Recommendations: No changes.  Advised to limit salt intake to 2000 mg/day and fluid intake to < 2 liters/day.  Encouraged to call for fluid symptoms.  Follow-up plan: ICM clinic phone appointment on 11/22/2016.    Copy of ICM check sent to device physician.   3 month ICM trend: 10/22/2016   1 Year ICM trend:      Rosalene Billings, RN 10/22/2016 11:50 AM

## 2016-11-02 ENCOUNTER — Other Ambulatory Visit: Payer: Self-pay | Admitting: Cardiovascular Disease

## 2016-11-02 ENCOUNTER — Other Ambulatory Visit: Payer: Self-pay | Admitting: Internal Medicine

## 2016-11-06 DIAGNOSIS — J8410 Pulmonary fibrosis, unspecified: Secondary | ICD-10-CM | POA: Diagnosis not present

## 2016-11-06 DIAGNOSIS — I110 Hypertensive heart disease with heart failure: Secondary | ICD-10-CM | POA: Diagnosis not present

## 2016-11-06 DIAGNOSIS — I5022 Chronic systolic (congestive) heart failure: Secondary | ICD-10-CM | POA: Diagnosis not present

## 2016-11-06 DIAGNOSIS — J84112 Idiopathic pulmonary fibrosis: Secondary | ICD-10-CM | POA: Diagnosis not present

## 2016-11-08 ENCOUNTER — Other Ambulatory Visit: Payer: Self-pay | Admitting: Internal Medicine

## 2016-11-14 ENCOUNTER — Telehealth: Payer: Self-pay | Admitting: Internal Medicine

## 2016-11-14 ENCOUNTER — Other Ambulatory Visit: Payer: Self-pay | Admitting: Internal Medicine

## 2016-11-14 ENCOUNTER — Encounter: Payer: Self-pay | Admitting: Internal Medicine

## 2016-11-14 NOTE — Telephone Encounter (Signed)
Can lab orders be put it for him?

## 2016-11-16 ENCOUNTER — Encounter: Payer: Self-pay | Admitting: Internal Medicine

## 2016-11-16 NOTE — Telephone Encounter (Signed)
error 

## 2016-11-18 ENCOUNTER — Other Ambulatory Visit: Payer: Self-pay | Admitting: Internal Medicine

## 2016-11-20 ENCOUNTER — Other Ambulatory Visit: Payer: Self-pay

## 2016-11-20 ENCOUNTER — Other Ambulatory Visit (INDEPENDENT_AMBULATORY_CARE_PROVIDER_SITE_OTHER): Payer: Medicare Other

## 2016-11-20 DIAGNOSIS — Z0000 Encounter for general adult medical examination without abnormal findings: Secondary | ICD-10-CM

## 2016-11-20 LAB — CBC WITH DIFFERENTIAL/PLATELET
BASOS PCT: 0.9 % (ref 0.0–3.0)
Basophils Absolute: 0.1 10*3/uL (ref 0.0–0.1)
EOS ABS: 0.2 10*3/uL (ref 0.0–0.7)
Eosinophils Relative: 1.8 % (ref 0.0–5.0)
HEMATOCRIT: 42.7 % (ref 39.0–52.0)
HEMOGLOBIN: 14.4 g/dL (ref 13.0–17.0)
LYMPHS PCT: 18 % (ref 12.0–46.0)
Lymphs Abs: 1.7 10*3/uL (ref 0.7–4.0)
MCHC: 33.7 g/dL (ref 30.0–36.0)
MCV: 96.7 fl (ref 78.0–100.0)
Monocytes Absolute: 0.8 10*3/uL (ref 0.1–1.0)
Monocytes Relative: 8.9 % (ref 3.0–12.0)
Neutro Abs: 6.5 10*3/uL (ref 1.4–7.7)
Neutrophils Relative %: 70.4 % (ref 43.0–77.0)
Platelets: 253 10*3/uL (ref 150.0–400.0)
RBC: 4.42 Mil/uL (ref 4.22–5.81)
RDW: 15.2 % (ref 11.5–15.5)
WBC: 9.2 10*3/uL (ref 4.0–10.5)

## 2016-11-20 LAB — BASIC METABOLIC PANEL
BUN: 20 mg/dL (ref 6–23)
CHLORIDE: 103 meq/L (ref 96–112)
CO2: 29 mEq/L (ref 19–32)
Calcium: 9.3 mg/dL (ref 8.4–10.5)
Creatinine, Ser: 0.84 mg/dL (ref 0.40–1.50)
GFR: 94.33 mL/min (ref >60.00)
Glucose, Bld: 71 mg/dL (ref 70–99)
Potassium: 4.3 mEq/L (ref 3.5–5.1)
Sodium: 141 mEq/L (ref 135–145)

## 2016-11-20 LAB — PSA: PSA: 0.26 ng/mL (ref 0.10–4.00)

## 2016-11-20 LAB — URINALYSIS, ROUTINE W REFLEX MICROSCOPIC
Bilirubin Urine: NEGATIVE
Hgb urine dipstick: NEGATIVE
KETONES UR: NEGATIVE
Leukocytes, UA: NEGATIVE
Nitrite: NEGATIVE
PH: 8.5 — AB (ref 5.0–8.0)
SPECIFIC GRAVITY, URINE: 1.01 (ref 1.000–1.030)
TOTAL PROTEIN, URINE-UPE24: NEGATIVE
URINE GLUCOSE: NEGATIVE
UROBILINOGEN UA: 0.2 (ref 0.0–1.0)

## 2016-11-20 LAB — LIPID PANEL
CHOLESTEROL: 107 mg/dL (ref 0–200)
HDL: 40 mg/dL (ref >39.00)
LDL CALC: 48 mg/dL (ref 0–99)
NONHDL: 66.85
Total CHOL/HDL Ratio: 3
Triglycerides: 94 mg/dL (ref 0.0–149.0)
VLDL: 18.8 mg/dL (ref 0.0–40.0)

## 2016-11-20 LAB — HEPATIC FUNCTION PANEL
ALT: 37 U/L (ref 0–53)
AST: 34 U/L (ref 0–37)
Albumin: 3.9 g/dL (ref 3.5–5.2)
Alkaline Phosphatase: 35 U/L — ABNORMAL LOW (ref 39–117)
BILIRUBIN DIRECT: 0.1 mg/dL (ref 0.0–0.3)
TOTAL PROTEIN: 6.6 g/dL (ref 6.0–8.3)
Total Bilirubin: 0.5 mg/dL (ref 0.2–1.2)

## 2016-11-20 LAB — TSH: TSH: 3.52 u[IU]/mL (ref 0.35–4.50)

## 2016-11-20 LAB — HEMOGLOBIN A1C: Hgb A1c MFr Bld: 6.3 % (ref 4.6–6.5)

## 2016-11-20 NOTE — Telephone Encounter (Signed)
Lab called needing orders put in for pt's upcoming CPE.

## 2016-11-22 ENCOUNTER — Telehealth: Payer: Self-pay | Admitting: Cardiology

## 2016-11-22 NOTE — Telephone Encounter (Signed)
Spoke with pt and reminded pt of remote transmission that is due today. Pt verbalized understanding.   

## 2016-11-23 ENCOUNTER — Ambulatory Visit (INDEPENDENT_AMBULATORY_CARE_PROVIDER_SITE_OTHER): Payer: Medicare Other | Admitting: Internal Medicine

## 2016-11-23 ENCOUNTER — Encounter: Payer: Self-pay | Admitting: Internal Medicine

## 2016-11-23 VITALS — BP 142/88 | HR 70 | Ht 68.0 in | Wt 186.0 lb

## 2016-11-23 DIAGNOSIS — D509 Iron deficiency anemia, unspecified: Secondary | ICD-10-CM | POA: Diagnosis not present

## 2016-11-23 DIAGNOSIS — E119 Type 2 diabetes mellitus without complications: Secondary | ICD-10-CM | POA: Diagnosis not present

## 2016-11-23 DIAGNOSIS — Z0000 Encounter for general adult medical examination without abnormal findings: Secondary | ICD-10-CM | POA: Diagnosis not present

## 2016-11-23 DIAGNOSIS — Z862 Personal history of diseases of the blood and blood-forming organs and certain disorders involving the immune mechanism: Secondary | ICD-10-CM | POA: Diagnosis not present

## 2016-11-23 MED ORDER — PIOGLITAZONE HCL 30 MG PO TABS: 30.0000 mg | ORAL_TABLET | Freq: Every day | ORAL | 3 refills | Status: DC

## 2016-11-23 MED ORDER — OMEPRAZOLE 20 MG PO CPDR: DELAYED_RELEASE_CAPSULE | ORAL | 3 refills | Status: DC

## 2016-11-23 NOTE — Progress Notes (Signed)
Subjective:    Patient ID: Terry Peck, male    DOB: 03/25/1941, 76 y.o.   MRN: [id number]  HPI Here for wellness and f/u;  Overall doing ok;  Pt denies Chest pain, worsening SOB, DOE, wheezing, orthopnea, PND, worsening LE edema, palpitations, dizziness or syncope.  Pt denies neurological change such as new headache, facial or extremity weakness.  Pt denies polydipsia, polyuria, or low sugar symptoms. Pt states overall good compliance with treatment and medications, good tolerability, and has been trying to follow appropriate diet.  Pt denies worsening depressive symptoms, suicidal ideation or panic. No fever, night sweats, wt loss, loss of appetite, or other constitutional symptoms.  Pt states good ability with ADL's, has low fall risk, home safety reviewed and adequate, no other significant changes in hearing or vision, and only occasionally active with exercise.  BP at home usually 118/90.  Thinks he has been able to lose some wt with diet change.  Wt is usually 181-183 at home.   Better diet recently.  No low sugars but wants to continue to try for more wt loss.   Wt Readings from Last 3 Encounters:  11/23/16 186 lb (84.4 kg)  08/17/16 168 lb (76.2 kg)  08/16/16 183 lb 4 oz (83.1 kg)   Past Medical History:  Diagnosis Date  . Abnormal ANCA test 07/03/2011  . Allergy   . Arthritis    "mild; right shoulder" (01/29/2012)  . B12 deficiency anemia   . Barrett's esophagus 11/23/2010  . Bundle branch block, left   . Cataract    L eye surgery  . CHF (congestive heart failure) ([place])    pacemaker  . Chronic systolic dysfunction of left ventricle    EF 30%  . Colon polyps 11/23/2010  . GERD (gastroesophageal reflux disease)   . Hyperlipidemia 10/14/2012  . Hypertension   . ICD (implantable cardiac defibrillator) in place   . Iron deficiency anemia 05/30/2011  . Liver abscess 1980's  . Nonischemic cardiomyopathy ([place])    s/p [employer]. Jude BiV ICD 01/29/12  . Pulmonary fibrosis ([place])   . Pulmonary  nodules   . Shortness of breath    "related to heart being out of rhythm" (01/29/2012)  . Type II diabetes mellitus ([place])      reports that he quit smoking about 28 years ago. His smoking use included Cigarettes. He has a 5.00 pack-year smoking history. He has never used smokeless tobacco. He reports that he does not drink alcohol or use drugs. family history includes Heart disease in his mother. Allergies  Allergen Reactions  . Penicillins Anaphylaxis and Other (See Comments)    "swelling of eyes; throat; could breath good" (01/29/2012)  . Iodine Other (See Comments)    "if I get iodine dye, I'll get a fever" (01/29/2012)  . Fish Oil Itching    Pt said only when given through IV   Current Outpatient Prescriptions on File Prior to Visit  Medication Sig Dispense Refill  . acetaminophen-codeine (TYLENOL #3) 300-30 MG tablet Take 1 tablet by mouth every 8 (eight) hours as needed for moderate pain. 30 tablet 0  . aspirin EC 81 MG tablet Take 81 mg by mouth daily.    . Calcium-Magnesium-Zinc 1000-400-15 MG TABS Take 1 tablet by mouth daily.     . carvedilol (COREG) 25 MG tablet TAKE ONE TABLET BY MOUTH TWICE DAILY WITH MEALS 180 tablet 3  . Cholecalciferol (VITAMIN D) 1000 UNITS capsule Take 1,000 Units by mouth 3 (three) times daily.     Marland Kitchen  Cyanocobalamin (B-12 PO) Take 1 tablet by mouth daily.     . ferrous sulfate 325 (65 FE) MG EC tablet Take 325 mg by mouth daily with breakfast.    . furosemide (LASIX) 40 MG tablet TAKE ONE TABLET BY MOUTH ONCE DAILY 30 tablet 11  . ibuprofen (ADVIL,MOTRIN) 200 MG tablet Take 200-300 mg by mouth 2 (two) times daily as needed. For pain    . Insulin Pen Needle (RELION PEN NEEDLES) 32G X 4 MM MISC Use to help administer insulin once a day. NEED OV FOR REFILLS 30 each 0  . LANTUS SOLOSTAR 100 UNIT/ML Solostar Pen INJECT 40 UNITS SUBCUTANEOUSLY ONCE DAILY 3 mL 11  . Lutein 20 MG CAPS Take 20 mg by mouth daily.    . metFORMIN (GLUCOPHAGE) 850 MG tablet TAKE ONE  TABLET BY MOUTH THREE TIMES DAILY 270 tablet 1  . methocarbamol (ROBAXIN) 500 MG tablet TAKE ONE TABLET BY MOUTH ONCE DAILY AS NEEDED 30 tablet 0  . Multiple Vitamin (MULTIVITAMIN WITH MINERALS) TABS Take 1 tablet by mouth daily.    . OMEGA 3 1000 MG CAPS Take 1,000 mg by mouth 2 (two) times daily.     Marland Kitchen atorvastatin (LIPITOR) 20 MG tablet Take 1 tablet (20 mg total) by mouth daily. 90 tablet 3  . losartan (COZAAR) 50 MG tablet Take 1 tablet (50 mg total) by mouth daily. 90 tablet 3  . [DISCONTINUED] simvastatin (ZOCOR) 80 MG tablet Take 1 tablet (80 mg total) by mouth at bedtime. 90 tablet 3   No current facility-administered medications on file prior to visit.    Review of Systems Constitutional: Negative for other unusual diaphoresis, sweats, appetite or weight changes HENT: Negative for other worsening hearing loss, ear pain, facial swelling, mouth sores or neck stiffness.   Eyes: Negative for other worsening pain, redness or other visual disturbance.  Respiratory: Negative for other stridor or swelling Cardiovascular: Negative for other palpitations or other chest pain  Gastrointestinal: Negative for worsening diarrhea or loose stools, blood in stool, distention or other pain Genitourinary: Negative for hematuria, flank pain or other change in urine volume.  Musculoskeletal: Negative for myalgias or other joint swelling.  Skin: Negative for other color change, or other wound or worsening drainage.  Neurological: Negative for other syncope or numbness. Hematological: Negative for other adenopathy or swelling Psychiatric/Behavioral: Negative for hallucinations, other worsening agitation, SI, self-injury, or new decreased concentration All other system neg per pt    Objective:   Physical Exam BP (!) 142/88   Pulse 70   Ht 5\' 8"  (1.727 m)   Wt 186 lb (84.4 kg)   SpO2 95%   BMI 28.28 kg/m  VS noted, bright and almost spry today Constitutional: Pt is oriented to person, place, and  time. Appears well-developed and well-nourished, in no significant distress and comfortable Head: Normocephalic and atraumatic  Eyes: Conjunctivae and EOM are normal. Pupils are equal, round, and reactive to light Right Ear: External ear normal without discharge Left Ear: External ear normal without discharge Nose: Nose without discharge or deformity Mouth/Throat: Oropharynx is without other ulcerations and moist  Neck: Normal range of motion. Neck supple. No JVD present. No tracheal deviation present or significant neck LA or mass Cardiovascular: Normal rate, regular rhythm, normal heart sounds and intact distal pulses.   Pulmonary/Chest: WOB normal and breath sounds without rales or wheezing  Abdominal: Soft. Bowel sounds are normal. NT. No HSM  Musculoskeletal: Normal range of motion. Exhibits no edema Lymphadenopathy: Has no  other cervical adenopathy.  Neurological: Pt is alert and oriented to person, place, and time. Pt has normal reflexes. No cranial nerve deficit. Motor grossly intact, Gait intact Skin: Skin is warm and dry. No rash noted or new ulcerations Psychiatric:  Has normal mood and affect. Behavior is normal without agitation No other exam findings  Lab Results  Component Value Date   WBC 9.2 11/20/2016   HGB 14.4 11/20/2016   HCT 42.7 11/20/2016   PLT 253.0 11/20/2016   GLUCOSE 71 11/20/2016   CHOL 107 11/20/2016   TRIG 94.0 11/20/2016   HDL 40.00 11/20/2016   LDLDIRECT 179.2 04/10/2013   LDLCALC 48 11/20/2016   ALT 37 11/20/2016   AST 34 11/20/2016   NA 141 11/20/2016   K 4.3 11/20/2016   CL 103 11/20/2016   CREATININE 0.84 11/20/2016   BUN 20 11/20/2016   CO2 29 11/20/2016   TSH 3.52 11/20/2016   PSA 0.26 11/20/2016   HGBA1C 6.3 11/20/2016   MICROALBUR 1.7 11/17/2014         Assessment & Plan:

## 2016-11-23 NOTE — Assessment & Plan Note (Signed)

## 2016-11-23 NOTE — Assessment & Plan Note (Addendum)
Good control, but ok for decresaed actos to 30 mg to avoid low sugars with further wt loss efforts

## 2016-11-23 NOTE — Assessment & Plan Note (Addendum)
With hx of transfusion last yr, suspect slow GI leak, for cont'd iron po, f/u cbc next visit

## 2016-11-23 NOTE — Patient Instructions (Addendum)
OK to decrease the actos to 30 mg per day  Please continue all other medications as before, and refills have been done if requested.  Please have the pharmacy call with any other refills you may need.  Please continue your efforts at being more active, low cholesterol diet, and weight control.  You are otherwise up to date with prevention measures today.  Please keep your appointments with your specialists as you may have planned  Please return in 6 months, or sooner if needed, with Lab testing done 3-5 days before

## 2016-12-03 NOTE — Progress Notes (Signed)
No ICM remote transmission received for 11/22/2016 and next ICM transmission scheduled for 12/27/2016.

## 2016-12-07 DIAGNOSIS — I110 Hypertensive heart disease with heart failure: Secondary | ICD-10-CM | POA: Diagnosis not present

## 2016-12-07 DIAGNOSIS — J8410 Pulmonary fibrosis, unspecified: Secondary | ICD-10-CM | POA: Diagnosis not present

## 2016-12-07 DIAGNOSIS — J84112 Idiopathic pulmonary fibrosis: Secondary | ICD-10-CM | POA: Diagnosis not present

## 2016-12-07 DIAGNOSIS — I5022 Chronic systolic (congestive) heart failure: Secondary | ICD-10-CM | POA: Diagnosis not present

## 2016-12-18 ENCOUNTER — Other Ambulatory Visit: Payer: Self-pay | Admitting: Internal Medicine

## 2016-12-18 NOTE — Telephone Encounter (Signed)
Please advise. I do not seen on med list.

## 2016-12-27 ENCOUNTER — Ambulatory Visit (INDEPENDENT_AMBULATORY_CARE_PROVIDER_SITE_OTHER): Payer: Medicare Other | Admitting: *Deleted

## 2016-12-27 ENCOUNTER — Telehealth: Payer: Self-pay | Admitting: Cardiology

## 2016-12-27 DIAGNOSIS — I5022 Chronic systolic (congestive) heart failure: Secondary | ICD-10-CM

## 2016-12-27 DIAGNOSIS — Z95810 Presence of automatic (implantable) cardiac defibrillator: Secondary | ICD-10-CM | POA: Diagnosis not present

## 2016-12-27 NOTE — Telephone Encounter (Signed)
Spoke with pt and reminded pt of remote transmission that is due today. Pt verbalized understanding.   

## 2016-12-28 NOTE — Progress Notes (Signed)
EPIC Encounter for ICM Monitoring  Patient Name: Terry Peck is a 76 y.o. male Date: 12/28/2016 Primary Care Physican: Biagio Borg, MD Primary Cardiologist:Nahser Electrophysiologist: Allred Dry Weight:unknown  Bi-V Pacing: >99%       Attempted call to patient and unable to reach.  Left detailed message regarding transmission.  Transmission reviewed.    Thoracic impedance normal.  Prescribed dosage: Furosemide 40 mg 1 tablet daily  Labs: 11/20/2016 Creatinine 0.84, BUN 19, Potassium 4.9, Sodium 141, EGFR 85-98 09/07/2016 Creatinine 0.84, BUN 19, Potassium 4.9, Sodium 141 08/16/2016 Creatinine 0.99, BUN 24, Potassium 4.7, Sodium 143 07/17/2016 Creatinine 0.86, BUN 54, Potassium 4.8, Sodium 141 02/09/2016 Creatinine 0.79, BUN 15, Potassium 5.1, Sodium 140 11/09/2015 Creatinine 0.82, BUN 18, Potassium 4.1, Sodium 138, EGFR 97.26   Recommendations: Left voice mail with ICM number and encouraged to call for fluid symptoms.  Follow-up plan: ICM clinic phone appointment on 01/29/2017.   Patient is due to schedule appointment with Dr Rayann Heman.  Copy of ICM check sent to Dr. Rayann Heman.   3 month ICM trend: 12/27/2016   1 Year ICM trend:      Rosalene Billings, RN 12/28/2016 9:37 AM

## 2016-12-28 NOTE — Progress Notes (Signed)
Remote ICD transmission.   

## 2016-12-31 ENCOUNTER — Other Ambulatory Visit: Payer: Self-pay | Admitting: Internal Medicine

## 2017-01-01 ENCOUNTER — Encounter: Payer: Self-pay | Admitting: Cardiology

## 2017-01-07 DIAGNOSIS — I110 Hypertensive heart disease with heart failure: Secondary | ICD-10-CM | POA: Diagnosis not present

## 2017-01-07 DIAGNOSIS — J8410 Pulmonary fibrosis, unspecified: Secondary | ICD-10-CM | POA: Diagnosis not present

## 2017-01-07 DIAGNOSIS — I5022 Chronic systolic (congestive) heart failure: Secondary | ICD-10-CM | POA: Diagnosis not present

## 2017-01-07 DIAGNOSIS — J84112 Idiopathic pulmonary fibrosis: Secondary | ICD-10-CM | POA: Diagnosis not present

## 2017-01-11 ENCOUNTER — Telehealth: Payer: Self-pay | Admitting: Pulmonary Disease

## 2017-01-11 NOTE — Telephone Encounter (Signed)
Called and lmomtcb x 1 for [name] with [facility].  Will need the fax number that she wants this last OV note sent to.

## 2017-01-14 NOTE — Telephone Encounter (Signed)
lmtcb for [name].

## 2017-01-15 NOTE — Telephone Encounter (Signed)
Attempted to call [facility] and could not leave a VM, it kept giving me a recording but no one ever answered the phone.

## 2017-01-16 NOTE — Telephone Encounter (Signed)
lmtcb x3 for [name] at [place].

## 2017-01-16 NOTE — Telephone Encounter (Signed)
OV notes have been printed and faxed to [facility]. Will close this message.

## 2017-01-16 NOTE — Telephone Encounter (Signed)
[name] with [facility]- Fax:  [place]

## 2017-01-17 ENCOUNTER — Encounter: Payer: Self-pay | Admitting: Internal Medicine

## 2017-01-22 LAB — CUP PACEART REMOTE DEVICE CHECK
Battery Voltage: 2.92 V
Brady Statistic AP VP Percent: 22 %
Brady Statistic AP VS Percent: 1 %
Brady Statistic AS VS Percent: 1 %
Brady Statistic RA Percent Paced: 22 %
HIGH POWER IMPEDANCE MEASURED VALUE: 73 Ohm
HighPow Impedance: 73 Ohm
Implantable Lead Implant Date: 20131008
Implantable Lead Location: 753858
Implantable Lead Location: 753859
Implantable Lead Location: 753860
Lead Channel Impedance Value: 400 Ohm
Lead Channel Impedance Value: 980 Ohm
Lead Channel Pacing Threshold Amplitude: 0.75 V
Lead Channel Pacing Threshold Amplitude: 0.875 V
Lead Channel Pacing Threshold Pulse Width: 0.5 ms
Lead Channel Pacing Threshold Pulse Width: 0.5 ms
Lead Channel Pacing Threshold Pulse Width: 0.5 ms
Lead Channel Sensing Intrinsic Amplitude: 11.4 mV
Lead Channel Setting Pacing Amplitude: 1.625
Lead Channel Setting Pacing Amplitude: 2 V
Lead Channel Setting Pacing Amplitude: 2 V
Lead Channel Setting Pacing Pulse Width: 0.5 ms
Lead Channel Setting Sensing Sensitivity: 0.5 mV
MDC IDC LEAD IMPLANT DT: 20131008
MDC IDC LEAD IMPLANT DT: 20131008
MDC IDC MSMT BATTERY REMAINING LONGEVITY: 36 mo
MDC IDC MSMT BATTERY REMAINING PERCENTAGE: 40 %
MDC IDC MSMT LEADCHNL RA PACING THRESHOLD AMPLITUDE: 0.625 V
MDC IDC MSMT LEADCHNL RA SENSING INTR AMPL: 3.7 mV
MDC IDC MSMT LEADCHNL RV IMPEDANCE VALUE: 400 Ohm
MDC IDC PG IMPLANT DT: 20131008
MDC IDC SESS DTM: 20180906080016
MDC IDC SET LEADCHNL LV PACING PULSEWIDTH: 0.5 ms
MDC IDC STAT BRADY AS VP PERCENT: 78 %
Pulse Gen Serial Number: 7054987

## 2017-01-28 ENCOUNTER — Other Ambulatory Visit: Payer: Self-pay | Admitting: Internal Medicine

## 2017-01-28 NOTE — Telephone Encounter (Signed)
Done erx 

## 2017-01-29 ENCOUNTER — Ambulatory Visit (INDEPENDENT_AMBULATORY_CARE_PROVIDER_SITE_OTHER): Payer: Medicare Other

## 2017-01-29 DIAGNOSIS — Z95810 Presence of automatic (implantable) cardiac defibrillator: Secondary | ICD-10-CM

## 2017-01-29 DIAGNOSIS — I5022 Chronic systolic (congestive) heart failure: Secondary | ICD-10-CM

## 2017-01-29 NOTE — Progress Notes (Signed)
EPIC Encounter for ICM Monitoring  Patient Name: LEIGH KAEDING is a 76 y.o. male Date: 01/29/2017 Primary Care Physican: Biagio Borg, MD Primary Cardiologist:Nahser Electrophysiologist: Allred Dry Weight:180 lbs Bi-V Pacing: >99%      Heart Failure questions reviewed, pt asymptomatic.   Thoracic impedance normal.  Prescribed dosage: Furosemide 40 mg 1 tablet daily  Labs: 11/20/2016 Creatinine 0.84, BUN 19, Potassium 4.9, Sodium 141, EGFR 85-98 09/07/2016 Creatinine 0.84, BUN 19, Potassium 4.9, Sodium 141 08/16/2016 Creatinine 0.99, BUN 24, Potassium 4.7, Sodium 143 07/17/2016 Creatinine 0.86, BUN 54, Potassium 4.8, Sodium 141 02/09/2016 Creatinine 0.79, BUN 15, Potassium 5.1, Sodium 140 11/09/2015 Creatinine 0.82, BUN 18, Potassium 4.1, Sodium 138, EGFR 97.26   Recommendations: No changes.   Encouraged to call for fluid symptoms.  Follow-up plan: ICM clinic phone appointment on 03/01/2017.    Copy of ICM check sent to Dr. Rayann Heman.   3 month ICM trend: 01/29/2017   1 Year ICM trend:      Rosalene Billings, RN 01/29/2017 8:21 AM

## 2017-02-06 ENCOUNTER — Encounter: Payer: Self-pay | Admitting: Internal Medicine

## 2017-02-06 DIAGNOSIS — J8410 Pulmonary fibrosis, unspecified: Secondary | ICD-10-CM | POA: Diagnosis not present

## 2017-02-06 DIAGNOSIS — J84112 Idiopathic pulmonary fibrosis: Secondary | ICD-10-CM | POA: Diagnosis not present

## 2017-02-06 DIAGNOSIS — I5022 Chronic systolic (congestive) heart failure: Secondary | ICD-10-CM | POA: Diagnosis not present

## 2017-02-06 DIAGNOSIS — I110 Hypertensive heart disease with heart failure: Secondary | ICD-10-CM | POA: Diagnosis not present

## 2017-02-14 ENCOUNTER — Telehealth: Payer: Self-pay | Admitting: Cardiology

## 2017-02-14 NOTE — Telephone Encounter (Signed)
Spoke w/ pt and requested that he send a manual transmission b/c his home monitor has not updated in at least 7 days.   

## 2017-02-16 ENCOUNTER — Encounter: Payer: Self-pay | Admitting: Cardiovascular Disease

## 2017-02-18 ENCOUNTER — Ambulatory Visit (INDEPENDENT_AMBULATORY_CARE_PROVIDER_SITE_OTHER): Payer: Medicare Other | Admitting: Pulmonary Disease

## 2017-02-18 ENCOUNTER — Ambulatory Visit (INDEPENDENT_AMBULATORY_CARE_PROVIDER_SITE_OTHER)
Admission: RE | Admit: 2017-02-18 | Discharge: 2017-02-18 | Disposition: A | Discharge status: Home / Self Care | Payer: Medicare Other | Source: Ambulatory Visit | Attending: Pulmonary Disease | Admitting: Pulmonary Disease

## 2017-02-18 VITALS — BP 142/76 | HR 71 | Temp 98.6°F | Ht 68.0 in | Wt 190.4 lb

## 2017-02-18 DIAGNOSIS — I5022 Chronic systolic (congestive) heart failure: Secondary | ICD-10-CM

## 2017-02-18 DIAGNOSIS — E118 Type 2 diabetes mellitus with unspecified complications: Secondary | ICD-10-CM | POA: Diagnosis not present

## 2017-02-18 DIAGNOSIS — J84112 Idiopathic pulmonary fibrosis: Secondary | ICD-10-CM | POA: Diagnosis not present

## 2017-02-18 DIAGNOSIS — R0902 Hypoxemia: Secondary | ICD-10-CM

## 2017-02-18 DIAGNOSIS — I110 Hypertensive heart disease with heart failure: Secondary | ICD-10-CM

## 2017-02-18 DIAGNOSIS — J984 Other disorders of lung: Secondary | ICD-10-CM | POA: Diagnosis not present

## 2017-02-18 DIAGNOSIS — Z794 Long term (current) use of insulin: Secondary | ICD-10-CM | POA: Diagnosis not present

## 2017-02-18 DIAGNOSIS — G4734 Idiopathic sleep related nonobstructive alveolar hypoventilation: Secondary | ICD-10-CM

## 2017-02-18 DIAGNOSIS — Z95810 Presence of automatic (implantable) cardiac defibrillator: Secondary | ICD-10-CM | POA: Diagnosis not present

## 2017-02-18 DIAGNOSIS — I509 Heart failure, unspecified: Secondary | ICD-10-CM | POA: Diagnosis not present

## 2017-02-18 DIAGNOSIS — R911 Solitary pulmonary nodule: Secondary | ICD-10-CM | POA: Diagnosis not present

## 2017-02-18 DIAGNOSIS — I447 Left bundle-branch block, unspecified: Secondary | ICD-10-CM

## 2017-02-18 NOTE — Patient Instructions (Signed)
Today we updated your med list in our EPIC system...    Continue your current medications the same...  Continue to use your Oxygen>>    2L/min flow at night...    2L/min flow w/ exertion to keep your O2 saturations >90% on the pulse oximeter  Keep up the good work w/ DIET (no salt, low carb) and EXERCISE (walking w/ your O2)...  Today we did a follow up CXR...    We will contact you w/ the results when available...   Call for any questions...  Let's plan a follow up visit in 21mo, sooner if needed for problems.Marland KitchenMarland Kitchen

## 2017-02-19 ENCOUNTER — Encounter: Payer: Self-pay | Admitting: Pulmonary Disease

## 2017-02-19 NOTE — Progress Notes (Signed)
Subjective:     Patient ID: ROWDY GUERRINI, male   DOB: 28-Jul-1940, 76 y.o.   MRN: [id number]  HPI 76 y/o WM, referred by Dr. Oliver Barre for increased SOB>> He is a friend of my pt Tarri Glenn...             MrThrasher was prev evaluated in 2013-14 by MR & notes summarized below>>  IOV 06/20/2011- Referred for lung nodule and dyspnea:    76year old male. reports that he quit smoking about 23 years ago. His smoking use included Cigarettes. He has a 5 pack-year smoking history (1/2 pack per day x 10 years). He has never used smokeless tobacco. Body mass index is 28.15 kg/(m^2). Known DM with nocturia - started on insulin 06/20/2011, HBP, Arthritis, Barretts- seeing Dr Christella Hartigan    Repoirts hx of small lung nodule with serial CT fu at [facility] for 2 years at [facility] ending 5 years ago. Stable and dc from followup. Does not remember which side    Reports insidious onset of DOE x 1year. Progressive gradually. Improved by rest. Notices when he mows yard, walking briskly one flight of stairs notices dyspnea. Always improved by rest. Wife notices he is not able to keep up with her during walks outside the home. No associated weight gain, or admissions to hospitals, no new medical problems diagnosed since onset of symptoms. Denies associated chest pain, cough, fever, weight gain, hemoptysis, edema, orthopnea, paroxysmal nocturnal dyspnea, wheezing. Systolic function was moderately to severely reduced.     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 Has active GERD. Denies collagen vascular disease. No steel dust, metal dust or asbestos exposure. Denies amio intake. Denies chemo. Does not have pet birds or feathery pillows     ECHO - 05/31/11: ejection fraction was in the range of 30% to 35%. There is hypokinesis of the inferior and inferoseptal myocardium. Doppler parameters are consistent with abnormal left ventricular relaxation (grade 1 diastolic dysfunction).  Ventricular septum: These changes are consistent with a left bundle branch block. Mitral valve: Mildly calcified annulus. Mildly thickened leaflets. Pulmonary arteries: Systolic pressure was mildly increased. PA peak pressure: 63mm Hg.     CT CHEST 05/31/2011 Findings: No enlarged axillary or supraclavicular lymph nodes. There is no enlarged mediastinal or hilar lymph nodes. No pericardial or pleural effusion identified. There is a patulous and fluid-filled thoracic esophagus. There is peripheral and basilar predominant interstitial reticulation. Bibasilar subpleural honeycombing is also identified. Mild bronchiectasis is present in the lung bases. Ground-glass attenuation nodular density is noted within the left upper lobe measuring 1.8 cm, image 29. Solid nodule within left lower lobe measures 6 mm. Review of the visualized osseous structures is unremarkable. Limited imaging through the upper abdomen is significant for a left renal cyst. Only partially visualized and incompletely characterized without IV contrast. IMPRESSION: 1. 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 2. Ground-glass attenuating nodule within the left upper lobe is nonspecific and may be related to interstitial lung disease. Low grade pulmonary adenocarcinoma may have a similar appearance. Follow-up imaging in 3 months is recommended to ensure stability.      PFT 06/06/11:Signs of restrictions c/w the ILD we see on CT present even though TLC 5L/81% is normal. This is because FVC 2.9L/69% and reduced (fev1 is 2.45L/86% and normal). ratip is 84 and normal. DLCO is reduced 11/50%  #lung nodule  - please sign release for [facility] to  send CD rom of CT chest  - likely not cancer but if we cannot get [facility] records we have to follow this iwht CT in 9 months  #Pulmonary fibrosis  - do not know why but acid reflux could have played a role  - please Serum: ESR, ANA, DS-DNA, RF, anti-CCP, ssA,  ssB, scl-70, ANCA, Total CK, Aldolase, Hypersensitivity Pneumonitis Panel, ACE level  - will call you with results to decide next step  #Shortness of breath  - both due to heart and lung issues  - between the two the heart is more important priority - please see cardiology ASAP  - our Nurse will ensure you have an appointment next few days    OV 07/19/2011:    Review dyspnea related to ILD NOS and new chronic systolic CHF. Since last visit has seen cardiology Dr Acie Fredrickson. States that with cards med mgmt: dyspnea improved a "little bit". Able to do more. Can now climb 2 flights of stairs non-stop (last visit only 1 flight of stairs). Saw Dr Acie Fredrickson yesterday 07/18/11: - NICM suspected- 07/11/11: Abnormal stress nuclear study: Dilated left ventricle with global hypokinesis. Suspect small mild area of reversible ischemia in mid anterior wall. The degree of severe LV dysfunction is out of proportion to the mild ischemia noted. LV Ejection Fraction: 29%. LV Wall Motion: Severe global hypokinesis. Meds adjusted. Cath on hold. Plan to place pacer if no improvement in EF with med mgmt. In temrs of workup for pulmonary fibrosis:     LABS 06/20/11 auto-immune test result: all negative including HP pa  nel except anca is trace positive at 1:20 which is the cut off between negative and positive. ESR 29 #lung nodule  - there are 2 spots in lung and there is possibiloty this could be lung cancer  - we absolultey need the scan fom [facility]; not just report but the CD rom of CT chest  - see if you can get it to [place] next 2 weeks if not repeat ct chest by Aug 28, 2011 (3rd months CT)  #Pulmonary fibrosis  - do not know why but acid reflux could have played a role  - at this point we will follow this - you need breathing test in 6 months or so .  - we will also use the CT chest results for the nodule to follow the pulmonary fibrosis  #Shortness of breath  - both due to heart and lung issues  - between  the two the heart is more important priority - Follow Dr Acie Fredrickson advise  - I will check with your cardiologist about suitability to attend [facility] rehab [facility]    OV 09/10/2011    Followup pulmonary nodule, ILD non-UIP pattern NOS and dyspnea due to ILD and Systolic CHF: Last visit was 2 months ago. In terms of nodule, we have been unable to get the actual cd rom from [facility]. So, he will need fu testing here. In terms of pulm fibrosis, Bx Plans placed on hold due to cardiac status at time of last ov. I learn from 08/01/11 Dr Acie Fredrickson cards notes that plan is for continued medical mgmt. His dyspnea is improved a lot due to great med mgmt and bnp 80s as of 08/01/11. He is able to mow yard now. He thinks dietary portion control and resultant weight loss has also helped with dyspnea. #lung nodule  - there are 2 spots in lung and there is possibiloty this could be lung cancer  -  because we could not get scan from [facility], please do CT chest wihtout contrast now  #Pulmonary fibrosis  - do not know why but acid reflux could have played a role  - repeat anca blood test because this was trace positive and will help [place] know if this played a role  - depending on findings on CT chest, might have to consider lung biopsy  - you need breathing test in 6 months or so .  #Shortness of breath  - both due to heart and lung issues; glad you are better  - between the two- the heart is more important priority- Follow Dr Acie Fredrickson advise  - Dr Acie Fredrickson gave okay and will refer you to pulmonary rehab  #Overweight  - follow [facility] Diet sheet I discussed with you; stick to foods in left lane    Phne call 09/13/11:    ANCA blood test repeat result negative    CT scan: - lung nodule report - nodule smaller than before; & scarring or fibrosis in lung - stable as before.   OV 01/09/2012    Dyspnea: Did 12 visits with rehab that helped immensely with dyspnea. Then $ issues. Walking at home 4mles non-stop at home with  wife with o2 holding. PFT 01/09/12 - fvc 3.11/74%, fev1 2.6L/93% ratipo 84, TLC 73%, DLCO 11/53%. Of note, the FVC is significantly better than feb 2013 (? Weight loss effect) but dlco is same. In terms of weight: lost 9# on [facility] low glycemic diet (6# since [facility] 2013) . Tolerating diet well. No headaches. No hunger pangs. GERD resolved. Soft stools ++. Overall this makes him feel better despite fact echo 12/19/11 is unchangd with LOW Systolic EF 356%and elevated PASP 377m Hg -- EF conitnues to be low and he has been referred for pacemaker. #weight  - continue low glycemic diet  - as you continue to lose weight, talk to pcp JaCathlean CowerMD about slowly coming off diabetic drugs  #lung nodule  - next ct chest is May 2014  #Lung fibrosis and shortness of breath  - glad shortness of breath better  - contnue exercise and diet  - glad you had flu shot  - will hold off lung biopsy conversation due to risk of bx with EF 35%; atleast till EP eval complete or there is evidence of progression on PFT    OV 09/17/2012:      Had pacer October 2013: and since then dyspnea significantly better/ Also had ECHO 07/28/12: shows EF improved to 55% and shows some LVH. After above, not having dyspnea for yard work. Able to do all yard work without sitting. Before was sitting 2-3 times. Currently no dyspnea or cough. Feels good.    PFT 01/09/12 - fvc 3.11/74%, fev1 2.6L/93% ratipo 84, TLC 73%, DLCO 11/53%    PFT 09/16/2012. FVC 2.9 L/ 70%. FEV1 2.5 L/93%. Ratio is 86/118%. Total lung capacity is 4.4 L/70%. DLCO is reduced at 40.7/54%. Essentially shows restriction with low diffusion    EKG Jan 2014: Qtc > 500 msec    CT chest 08/26/2012: My impression is UIP> IMPRESSION: No change in subpleural reticular and cystic changes as above, which may be seen with usual interstitial pneumonitis, idiopathic pulmonary fibrosis, or hypersensitivity pneumonitis.  Stable 6 mm left lower lobe pulmonary nodule, no persistent  nodularity to the previously seen subpleural area of left upper lobe ground-glass airspace opacity.     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 Age > [age]ith subpleural, bibasal  densities and in my opinion basal honey combing as well. Autoimmune is negative. Dyspnea resolved/improved significantly after pacer. I explained the disease and diagnosis to him. Explained, that high odds of IPF and diseaes is progressive, and ultimatly fatal and could be debilitating. Recommended that we screen him for pirfenidone via [employer] He is in agreement. He seems to meet criteria for the EAP program except Jan 2014 EKG showed long Qtc. Patient is interested.    ==>Ultimately he was not a candidate for Pirfenidone due to his cardiac problems.        ~  November 30, 2014:  Pulmonary consult w/ SN>        MrThrasher was recently [facility] 7/27- 11/18/14 by [facility] due to severe anemia; he states he developed recurrent SOB in mid-July and eval by PCP showed Hg=6; prior to this he was mowing his yard & stable; he denied any bleeding or change in stools; hosp eval revealed low MCV, Fe=10 (2%sat), Ferritin=4 (this suggests long slow ooze), B12=174, Folate=32; stool check in hosp was said to be NEG for blood...  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 He was disch on FeSO4 Tid => Breathing is OK at rest, DOE w/ walking/ mowing=> now improved since transfusion; he denies cough, sputum/ hemoptysis; he denies CP/ palpit/ edema;  Also needs oral B12 supplement daily w/ f/u B12 level per protocol...       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      Current Meds> ASA81, Coreg25Bid, Lasix40, Atorva40, Metform850Tid, Actos45, Lantus44, Omep20Bid... EXAM reveals Afeb, VSS, O2sat=93% on RA at rest;  HEENT- neg, mallampati2;  Chest- bibasilar rales ~1/2 way up, no wheezing or rhonchi;  Heart- RR gr 1/6 SEM w/o r/g;  Abd soft, neg;  Ext- neg w/o c/c/e...  2DEcho 10/2014>  modLVH w/ incr wall thickness, EF=50-55% & no regional wall motion abn, AoV mod calcif & thickened w. Mild AS, MV is wnl; RV function normal, PAsys~3469m...  CXR 11/17/14 showed norm heart size, AICD on left, bibasilar fibrosis vs atx sl incr from prev...  Spirometry 11/30/14 showed FVC=2.24 (54%), FEV1=1.97 (62%), %1sec=88, mid-flows are wnl at 125% predicted... (Vol & flow are both reduced from prev).  Ambulatory oxygen saturation test 11/30/14> O2sat=94% on RA at rest w/ pulse=60/min; he ambulated 2 laps & stopped w/ dyspnea; lowest O2sat=87% w/ pulse of 80/min...   CT Chest 12/01/14 showed LUL ground-glass nodule is less prominent, LLL 6mm67mdule is unchanged, chronic ILD which appears slightly progressed in the periphery of both lungs; heart size is normal, extensive coronary artery calcification & AICD in place...  LABS 11/30/14 showed Hg=9.9 I [facility], PFTs, and Labs> and discussed w/ pt & wife face-to-face during our 60mi26mpt... IMP/PLAN>>  Shailen's recent incr in SOB was clearly due to his severe anemia which in turn was likely related to a slow  ooze from his HH/ Barrett's mucosa and the fact that he stopped his oral iron therapy ~102yr ago;  He is feeling sl better after the 2uTx & oral iron Rx w/ Hg up tp ~10 now;  He is asked to stay on the Fe Bid & B12 1031mcg/d... Having noted this & the recent Cards f/u indicating  stable status- his 65yr f/u CT Chest and spirometry does indicate some progression of his ILD... I am in favor of considering some treatment for his pulm fibrosis- he was deemed not a candidate for pirfenedone due to his cardiac problems & AICD;  I outlined 3 options to the pt & his wife>  1) Conservative approach- continue observation to see how his dypnea improves w/ Fe & an exercise program;  2) Middle-of-the-road approach- perhaps considering an ICS like Pulmicort;  3) Aggressive approach- revisit eligibility for Pirfenedone vs trial of oral Pred... They clearly lean in the conservative direction & Berley does not want additional meds at this time; he wants to wait until his Hg has ret to normal & to see how his exercise program progresses... We plan ROV in 3 mo w/ CXR.  ~  March 02, 2015:  82mo ROV w/ f/u CXR> as outlined above Esequiel has been taking the FeSO4 Tid and B12 supplement regularly; he says he feels considerably better & claims his breathing is improved, overall doing well- notes DOE w/ exertion (eg- digging a bif hole to transplant schrubs), states he mows ok, walks ok, stairs ok, etc; he has O2 concentrator at home & hasn't needed to use it he says- it's a $23 copay per month & he wants to get rid of it if poss;(we discussed checking an ONO first)... He saw CARDS- DrNahser 02/17/15 for f/u HBP, SysCHF w/ EF=30-35%, LBBB, Bi-V ICD in place, HL, DM> Myoview had EF=29%, global LV dil, no ischemia; meds adjusted- on Coreg25Bid, Lasix40, ASA81; f/u LVF normalized on the Bi-V pacer=> this is checked by DrAllred for EP...     EXAM reveals Afeb, VSS, O2sat=94% on RA at rest;  HEENT- neg, mallampati2;  Chest- bibasilar rales ~1/2 way up, no wheezing or rhonchi;  Heart- RR gr 1/6 SEM w/o r/g;  Abd soft, neg;  Ext- neg w/o c/c/e...  CXR 03/02/15>  Mild cardiomeg, pacer stable, prominent interstitial markings/fibrosis- similar to prior, no change...  LABS 03/02/15>  CBC- improved w/ Hg=15, Fe=262 (62%sat),  Ferritin=16;  B12 >1500... REC to decr Fe to one daily & decr B12 to 1/2 tab daily...  IMP/PLAN>>  Caedon is improved, asked to decr the Fe & B12 as above; we will check ONO to assess need for continued Home oxygen; ROV planned 65mo...   ~  October 18, 2015:  7-82mo ROV & I note that Trenden never had the ONO done as requested after his last visit; His PCP- DrJohn had [facility] pick it up & pt feels that he is doing satis w/o the oxygen- denies cough, sput, SOB, CP, edema, etc; he notes no deterioration in his breathing- DOE w/ exertion as before; NOTE> he has a pulse-ox monitor & says that his O2 drops w/ exercise, getting as low as 85% on RA w/ mod exertion, "but it returns to normal quickly after rest;  He says 94-95% sat at rest on RA, and 88-89% w/ mowing/ walking;  He again denies CP/ palpit/ dizzy, edema & followed by DrNahser & DrAllred as noted (they have asked him to lim his sodium intake)...    EXAM  reveals Afeb, VSS, O2sat=94% on RA at rest;  HEENT- neg, mallampati2;  Chest- bibasilar rales ~1/2 way up, no wheezing or rhonchi;  Heart- RR gr 1/6 SEM w/o r/g;  Abd soft, neg;  Ext- neg w/o c/c/e...  Ambulatory Oximetry 10/18/15>  O2sat=94% on RA at rest w/ pulse=66/min;  He ambulated 2 laps in office (185' ea) w/ lowest O2sat=86% w/ pulse=83/min...   Overnight Oximetry> ordered again, we were never notified of the results, finally contacted [facility] & told he desaturated to <88% on RA for 131mn of the overnight study=> restart O2 at 2L/min Qhs... IMP/PLAN>>  Pt is resistent to the idea of home oxygen but his studies indicate need for NOCTURNAL O2 at 2L/min AND ambulatory O2 w/ exertion; he will capitulate to the former but declines O2 w/ exercise believing that he is stable & doing satis w/ quick recovery after exercise...   ~  February 16, 2016:  467moOV & pulmonary follow up visit> DaLoteturns and reports feeling well- no new complaints or concerns; he is using his O2 at 2L/min flow Qhs, but doesn't  think he needs it during the day w/ usual activities; he says that his breathing is good, denies cough/ sput/ hemoptysis/ ch in DOE/ CP/ palpit/ dizzy/ ede4m16m.     He saw PCP-DrJohn 11/09/15> doing satis, Labs were ok w/ BS=133, but A1c=8.6 and his Lantus was increased; on Lantus40, Metform850Tid, Actos45    He had CARDS f/u DrNahser 02/13/16> HBP, LBBB, SysCHF w/ EF=30-35% that normalized w/ Bi-V pacer, HL-- last 2DEcho 7/16 showed EF=50-55%; Pt stopped his Lip40 believing that this caused his Glucose to go up; he has since restarted Atorva20 & will check w/ DrJohn for f/u FLP...     He saw EP clinic, DrAllred & NP 02/02/16> reported doing well, norm ICD function confirmed,  EXAM reveals Afeb, VSS, O2sat=95% on RA at rest;  HEENT- neg, mallampati2;  Chest- bibasilar rales ~1/2 way up, no wheezing or rhonchi;  Heart- RR gr 1/6 SEM w/o r/g;  Abd soft, neg;  Ext- neg w/o c/c/e...  CXR 02/16/16>  Norm heart size, ICD well positioned on the left, bilat interstitial thickening is stable, unchanged, NAD... IMP/PLAN>>  DavAnanias stoic but stable, reports breathing satis w/o recent exac etc..recommended to continue same, stay active, rov in 7mo61moner if needed prn...  ~  August 16, 2016:  7mo 89mo& DavidGauthamrns w/o complaints, feeling well, & denies any problems currently;  He says his breathing is good & he denies cough, sput, hemoptysis or SOB- he does admit to DOE "with heavy exertion" (he push mows the yard & stops 1/2 way thru to rest, then proceeds);  He does have some NOCTURNAL & exercise induced hypoxemia- he wears O2 at 2L/min Qhs but says he doesn't need it during the day... we reviewed the following medical problems during today's office visit >>     Hx dyspnea, ILD- pulm fibrosis, restrictive lung physiology>  [facility] pt decided against pirfenidone & he didn't qualify for the study due to his CHF; he was followed by his PCP until referral 11/2014=> see above- he  chose conservative approach & did not want additional meds; his ILD has been slowly progressive but he has remained stoic & denies symptoms of cough or dyspnea, but breathing did improve w/ treatment of his anemia (Fe-Tid & oral B12 supplement), and w/ Pulm Rehab x several months in 2017 but too $$ he said...Marland KitchenMarland Kitchen  Nocturnal hypoxemia and exercise induced hypoxemia> as noted he uses nocturnal O2 at 2L/min, but only uses exercise O2 when he feels he needs it & he checks w/ home oximeter; Note: PAsys ~34 on last 2DEcho (7/16).    Hx small lung nodules on CT Chest> this was found & followed at the [place]; he has had several CT Chest scans here- last 11/2014 & summary shows sl progressive peripheral interstitial dis over time, underlying paraseptal emphysema & bilat LL bronchiectasis, no adenopathy, stable 63m LLL nodule & an area in LUL w/ GGO that appears less prominent serially; incidental findings included atherosclerotic ao, coronary calcif, gallstones, renal cyst...    Ex-smoker> min smoking hx, only smoked for 10 yrs, and quit 1990.    HBP>  Controlled on meds- ASA81, Coreg25Bid & Lasix40/d;  BP= 120/80 and he denies CP, palpit, SOB, dizzy, or edema...    Cardiomyopathy, chr systolic CHF, LBBB, BiV pacer> followed by DrNahser & DrAllred, SysCHF w/ EF=30-35% that normalized w/ Bi-V pacer- last 2DEcho 7/16 showed EF=50-55%...    IDDM> [facility] now followed by PCP-DrJJohn on Lantus40u, Metform850Tid, Actos45; LABS 10/2015 showed FBS=133 & A1c=8.6;  f/u LABS today showed BS=137, A1c=6.7 => he is rec to STOP the Actos & continue the Lantus40 & Metform Tid + low carb diet...     HL> on Atorva20 & Omega-3 fish oil; last FLP 06/2016 showed TChol 104, TG 100, HDL 42, LDL 42-- rec to continue same meds + diet....    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    DJD> aware, on Advil, Robaxin, Tylenol#3 as needed...    [facility]/ dypnea & Hg=6, iron deficient (Ferritin=4) & low B12 (174)-- presumed slow ooze from his large HH, no direct source found;  Hg & Fe ret to normal on oral supplement, same for B12;  Currently taking FeSO4- '325mg'$ /d, and B12- 100074md- continue same. EXAM reveals Afeb, VSS, Wt down 7# to 183#; O2sat=93% on RA at rest;  HEENT- neg, mallampati2;  Chest- bibasilar rales ~1/2 way up, no wheezing or rhonchi;  Heart- RR gr 1/6 SEM w/o r/g;  Abd soft, neg;  Ext- neg w/o c/c/e...  Ambulatory Oximetry 08/16/16>  O2sat=90% on RA at rest w/ pulse=75/min;  He ambulated 2 Laps in the office (185'ea) & lowest O2sat=83% w/ pulse=92/min;  He is rec to use O2 w/ all activities and ambulation...   LABS 08/16/16>  Chems- ok w BS=137, A1c=6.7, Cr=0.99, BNP=39;  CBC- wnl w/ Hg=15.5, mcv=95;  Fe=87 (19%sat), Ferritin=18.7 (22-322)... IMP/PLAN>>  DavArif 89o & has slowly progressive IPF along w/ NICM, IDDM, and hx IDA;  He has twice declined consideration of anti-fibrotic therapy, and he is quite stoic- continuing to deny syptoms in the face of slowly progressive fibrosis on CXR/scans, and hypoxemia w/ exercise;  He is rec to use the O2 w/ all activities and qhs (ok to take it off at rest 7 he monitors O2sats w/ his home monitor (all>90%);  We will proceed w/ a f/u 2DEcho to recheck EF & PAsys;  DM is improved w/ A1c=6.7 & he is rec to STOP the Actos in light of his cardiac dis;  Finally his Hg is 15 on FeSO4 one daily- rec  to continue w/ his Ferritin level...  ADDENDUM>>  2DEcho 09/07/16>>  Severe focal basilar hypertrophy & mild concentric hypertrophy, norm LVF w/ EF=60-65% w/ norm wall motion; Gr1DD; AoV mildly calcif & no AS, no AI; MV- norm; Atria/ RV/ PA- all wnl...   ~  February 18, 2017:  31moROV & DDervinreports  feeling great, denies problems and has no new complaints or concerns;  He is wearing his O2 at 2L/min Qhs and prn days (eg- with exertion);  He says he is able to get a good deep breath & "I don't ever feel out of breath";  He denies cough, sput, hemoptysis; there is no chest congestion, no CP/ palpit/ edema, and his DOE is stable/ unchanged... We reviewed the following interval Epic notes since last visit>      He saw CARDS- DrNahser on 08/17/16>  Hx HBP, LBBB, NICM w/ chr sys CHF w/ EF 30-35%=> improved to 55%, Bi-V ICD placed, HL;  On ASA81, Coreg25Bid, Lasix40, Lipitor20;  Doing well w/o symptoms, no edema;  They added Losartan50 & rec that he stop his Actos...     He had f/u w/ PCP- DrJohn on 11/23/16>  Wellness visit, doing satis, note reviewed, they decided to decr his Actos to '30mg'$ /d (I had prev directed the pt to STOP the Actos 07/2016).    He continues to have his ICD monitored remotely every month per DrAllred>  Most recent report 01/29/17- pacing >99%, thoracic impedance is normal, no changes made...  We reviewed the following medical problems during today's office visit>     Hx dyspnea, ILD- pulm fibrosis, restrictive lung physiology>  [facility] pt decided against pir[facility] & he didn't qualify for the study due to his CHF; he was followed by his PCP until referral 11/2014=> see above- he chose conservative approach & did not want additional meds; his ILD has been slowly progressive but he has remained stoic & denies symptoms of cough or dyspnea, but breathing did improve w/ treatment of his anemia (Fe-Tid & oral B12 supplement), and w/ Pulm Rehab x several months in 2017 but too $$ he said; he says he is walking some but needs to do more.    Nocturnal hypoxemia and exercise induced hypoxemia> as noted he uses nocturnal O2 at 2L/min, but only uses exercise O2 when he feels he needs it & he checks w/ home oximeter; Note: PAsys ~34 on last 2DEcho (7/16).    Hx small  lung nodules on CT Chest> [place]- last 11/2014 & summary shows sl progressive peripheral interstitial dis over time, underlying paraseptal emphysema & bilat LL bronchiectasis, no adenopathy, stable 683mLLL nodule & an area in LUL w/ GGO that appears less prominent serially; incidental findings included atherosclerotic ao, coronary calcif, gallstones, renal cyst...    Ex-smoker> min smoking hx, only smoked for 10 yrs, and quit 1990.    HBP>  Controlled on meds- ASA81, Coreg25Bid, Losartan50, & Lasix40/d;  BP= 142/76 and he denies CP, palpit, SOB, dizzy, or edema...    Cardiomyopathy, chr systolic CHF, LBBB, BiV pacer> followed by DrNahser & DrAllred, SysCHF w/ EF=30-35% that normalized w/ Bi-V pacer- last 2DEcho 7/16 showed EF=50-55%; monthly thoracic impedance checks OK.    IDDM> [facility] now followed by PCP-DrJJohn on Lantus40u, Metform850Tid, Actos30; LABS 07/2016 showed BS=137, A1c=6.7 => I directed him to stop the Actos  4/18 but DrJohn preferred to decr to '30mg'$ .    HL> on Atorva20 & Omega-3 fish oil; last FLP 06/2016 showed TChol 104, TG 100, HDL 42, LDL 42-- rec to continue same meds + diet....    HH/ Barrett's esoph, colon polyp> followed for GI by DrJacobs- had EGD 09/06/14 showing 3-4cmHH & Barrett's epithelium (Bx= no dysplasia or malignancy)- he has been taking Prilosec20Bid regularly; Last colonoscopy was 07/18/11 showing 2 sessile polyps- one hyperplastic, one tub adenoma & f/u planned 67yr...    DJD> aware, on Advil, Robaxin, Tylenol#3 as needed...    [facility] & Hg=6, iron deficient (Ferritin=4) & low B12 (174)-- presumed slow ooze from his large HH, no direct source found;  Hg & Fe ret to normal on oral supplement, same for B12;  Currently taking FeSO4- '325mg'$ /d, and B12- 10072m/d- continue same. EXAM reveals Afeb, VSS, Wt 191#; O2sat=85% on RA on arrival, 92% on 2L/min;   HEENT- neg, mallampati2;  Chest- bibasilar rales ~1/2 way up, no wheezing or rhonchi;  Heart- RR gr 1/6 SEM w/o r/g;  Abd soft, neg;  Ext- neg w/o c/c/e...  CXR 02/18/17 (independently reviewed by me in the PACS system) shows stable heart size & vascularity, left sided AICD, similar bilat reticulonodular interstitial opacities- stable, NAD...  IMP/PLAN>>  DaLandrums stable w/ his heart & lung problems- Again rec to increase his exercise program & use his O2 2L/min Qhs and w/ activity    Past Medical History:  Diagnosis Date  . Abnormal ANCA test 07/03/2011  . Allergy   . Arthritis    "mild; right shoulder" (01/29/2012)  . B12 deficiency anemia   . Barrett's esophagus 11/23/2010  . Bundle branch block, left   . Cataract    L eye surgery  . CHF (congestive heart failure) ([facility]   pacemaker  . Chronic systolic dysfunction of left ventricle    EF 30%  . Colon polyps 11/23/2010  . GERD (gastroesophageal reflux disease)   . Hyperlipidemia 10/14/2012  . Hypertension   . ICD (implantable cardiac defibrillator) in place   . Iron deficiency anemia 05/30/2011  . Liver abscess 1980's  . Nonischemic cardiomyopathy ([facility]   s/p [employer] BiV ICD 01/29/12  . Pulmonary fibrosis ([facility]  . Pulmonary nodules   . Shortness of breath    "related to heart being out of rhythm" (01/29/2012)  . Type II diabetes mellitus ([facility]    Past Surgical History:  Procedure Laterality Date  . abscess drained  ?1980's   liver  . BI-VENTRICULAR IMPLANTABLE CARDIOVERTER DEFIBRILLATOR N/A 01/29/2012   Procedure: BI-VENTRICULAR IMPLANTABLE CARDIOVERTER DEFIBRILLATOR  (CRT-D);  Surgeon: JaThompson GrayerMD;  [facility];  Service: Cardiovascular;  Laterality: N/A;  . CARDIAC DEFIBRILLATOR PLACEMENT  01/29/2012   SJM [employer] BiV ICD implanted by Dr AlRayann Heman. CATARACT EXTRACTION Left   . COLONOSCOPY    . TONSILLECTOMY  1952  . UPPER GI ENDOSCOPY      Outpatient Encounter Prescriptions as of 02/18/2017   Medication Sig  . acetaminophen-codeine (TYLENOL #3) 300-30 MG tablet Take 1 tablet by mouth every 8 (eight) hours as needed for moderate pain.  . Marland Kitchenspirin EC 81 MG tablet Take 81 mg by mouth daily.  . Calcium-Magnesium-Zinc 1000-400-15 MG TABS Take 1 tablet by mouth daily.   . carvedilol (COREG) 25 MG tablet TAKE ONE TABLET BY MOUTH TWICE DAILY WITH MEALS  . Cholecalciferol (VITAMIN D) 1000 UNITS capsule Take  1,000 Units by mouth 3 (three) times daily.   . Cyanocobalamin (B-12 PO) Take 1 tablet by mouth daily.   . ferrous sulfate 325 (65 FE) MG EC tablet Take 325 mg by mouth daily with breakfast.  . furosemide (LASIX) 40 MG tablet TAKE ONE TABLET BY MOUTH ONCE DAILY  . ibuprofen (ADVIL,MOTRIN) 200 MG tablet Take 200-300 mg by mouth 2 (two) times daily as needed. For pain  . LANTUS SOLOSTAR 100 UNIT/ML Solostar Pen INJECT 50 UNITS SUBCUTANEOUSLY ONCE DAILY  . Lutein 20 MG CAPS Take 20 mg by mouth daily.  . metFORMIN (GLUCOPHAGE) 850 MG tablet TAKE 1 TABLET BY MOUTH THREE TIMES DAILY  . methocarbamol (ROBAXIN) 500 MG tablet TAKE ONE TABLET BY MOUTH ONCE DAILY AS NEEDED  . Multiple Vitamin (MULTIVITAMIN WITH MINERALS) TABS Take 1 tablet by mouth daily.  . OMEGA 3 1000 MG CAPS Take 1,000 mg by mouth 2 (two) times daily.   Marland Kitchen omeprazole (PRILOSEC) 20 MG capsule TAKE 1 CAPSULE BY MOUTH TWICE DAILY .  Marland Kitchen pioglitazone (ACTOS) 30 MG tablet Take 1 tablet (30 mg total) by mouth daily.  Marland Kitchen RELION PEN NEEDLES 32G X 4 MM MISC USE 1  ONCE DAILY TO ADMINISTER INSULIN  . atorvastatin (LIPITOR) 20 MG tablet Take 1 tablet (20 mg total) by mouth daily.  Marland Kitchen losartan (COZAAR) 50 MG tablet Take 1 tablet (50 mg total) by mouth daily.  . [DISCONTINUED] LANTUS SOLOSTAR 100 UNIT/ML Solostar Pen INJECT 40 UNITS SUBCUTANEOUSLY ONCE DAILY (Patient not taking: Reported on 02/18/2017)   No facility-administered encounter medications on file as of 02/18/2017.     Allergies  Allergen Reactions  . Penicillins Anaphylaxis and  Other (See Comments)    "swelling of eyes; throat; could breath good" (01/29/2012)  . Iodine Other (See Comments)    "if I get iodine dye, I'll get a fever" (01/29/2012)  . Fish Oil Itching    Pt said only when given through IV    Immunization History  Administered Date(s) Administered  . Influenza Split 01/03/2012  . Influenza Whole 05/03/2011  . Influenza, High Dose Seasonal PF 01/17/2016  . Influenza,inj,Quad PF,6+ Mos 03/24/2013, 12/09/2013, 03/02/2015  . Influenza-Unspecified 02/04/2017  . Pneumococcal Conjugate-13 10/21/2008, 04/14/2013, 02/04/2017  . Pneumococcal Polysaccharide-23 01/03/2012  . Tdap 11/23/2010  . Zoster 04/14/2013    Current Medications, Allergies, Past Medical History, Past Surgical History, Family History, and Social History were reviewed in [employer] record.   Review of Systems            All symptoms NEG except where BOLDED >>  Constitutional:  F/C/S, fatigue, anorexia, unexpected weight change. HEENT:  HA, visual changes, hearing loss, earache, nasal symptoms, sore throat, mouth sores, hoarseness. Resp:  cough, sputum, hemoptysis; SOB, tightness, wheezing. Cardio:  CP, palpit, DOE, orthopnea, edema. GI:  N/V/D/C, blood in stool; reflux, abd pain, distention, gas. GU:  dysuria, freq, urgency, hematuria, flank pain, voiding difficulty. MS:  joint pain, swelling, tenderness, decr ROM; neck pain, back pain, etc. Neuro:  HA, tremors, seizures, dizziness, syncope, weakness, numbness, gait abn. Skin:  suspicious lesions or skin rash. Heme:  adenopathy, bruising, bleeding. Psyche:  confusion, agitation, sleep disturbance, hallucinations, anxiety, depression suicidal.   Objective:   Physical Exam      Vital Signs:  Reviewed...   General:  WD, WN, 76 y/o WM in NAD; alert & oriented; pleasant & cooperative... HEENT:  [place]/AT; Conjunctiva- pink, Sclera- nonicteric, EOM-wnl, PERRLA, EACs-clear, TMs-wnl; NOSE-clear; THROAT-clear & wnl.   Neck:  Supple w/ fair ROM; no JVD; normal carotid impulses w/o bruits; no thyromegaly or nodules palpated; no lymphadenopathy.  Chest:  decr BS bilat w/ velcro rales bilat ~1/2 way up; no wheezing or rhonchi, no signs of consolidation... Heart:  AICD on left, Regular Rhythm; gr 1/6 SEM w/o rubs or gallops detected. Abdomen:  Soft & nontender- no guarding or rebound; normal bowel sounds; no organomegaly or masses palpated Ext:  decrROM; without deformities +arthritic changes; no varicose veins, venous insuffic, or edema;  Pulses intact w/o bruits. Neuro:  No focal neuro deficits; sensory testing normal; gait normal & balance OK. Derm:  No lesions noted; no rash etc. Lymph:  No cervical, supraclavicular, axillary, or inguinal adenopathy palpated.   Assessment:      IMP >>     Multifactorial dyspnea -- fibrosis/ restriction, CHF, anemia, deconditioning all playing a roll     Pulmonary fibrosis -- very slowly progressive per prev scans, pt desires conservative approach & does not want additional meds.    Pulmonary nodules -- stable on serial CT Chest scans    NICM w/ chr sys CHF, LBBB -- per DrNasher/ Allred, stable    AICD placed -- stable    Anemia- Iron defic & low B12 -- likely slow ooze from HH/ Barrett's & absorption issues -- improved w/ FeSO4 and B12 orally- stay on PPI Bid as well...    Other medical problems as noted> HL, IDDM (watch Actos rx w/ CHF diagnosis)  PLAN >>  11/30/14>   Attila's recent incr in SOB was clearly due to his severe anemia which in turn was likely related to a slow ooze from his HH/ Barrett's mucosa and the fact that he stopped his oral iron therapy ~64yrago;  He is feeling sl better after the 2uTx & oral iron Rx w/ Hg up tp ~10 now;  He is asked to stay on the Fe Bid & B12 1003m/d... Having noted this & the recent Cards f/u indicating stable status- his 2y49yru CT Chest and spirometry does indicate some progression of his ILD... I am in favor of considering some  treatment for his pulm fibrosis- he was deemed not a candidate for pirfenedone due to his cardiac problems & AICD;  I outlined 3 options to the pt & his wife>  1) Conservative approach- continue observation to see how his dypnea improves w/ Fe & an exercise program;  2) Middle-of-the-road approach- perhaps considering an ISC like Pulmicort;  3) Aggressive approach- revisit eligibility for Pirfenedone vs trial of oral Pred... They clearly lean in the conservative direction & DavJaiydenes not want additional meds at this time; he wants to wait until his Hg has ret to normal & to see how his exercise program progresses... We plan ROV in 3 mo w/ CXR. 03/02/15>   DavJody improved, asked to decr the Fe & B12 as above; we will check ONO to assess need for continued Home oxygen; ROV planned [id number] 10/18/15>   Pt is resistent to the idea of home oxygen but his studies indicate need for NOCTURNAL O2 at 2L/min AND ambulatory O2 w/ exertion; he will capitulate to the former but declines O2 w/ exercise believing that he is stable & doing satis w/ quick recovery after exercise...  02/16/16>   Pt reports stable, no new complaints or concerns... 08/16/16>   DaviShanon Browy46 & has slowly progressive IPF along w/ NICM, IDDM, and hx IDA;  He has twice declined consideration of anti-fibrotic therapy, and  he is quite stoic- continuing to deny syptoms in the face of slowly progressive fibrosis on CXR/scans, and hypoxemia w/ exercise;  He is rec to use the O2 w/ all activities and qhs (ok to take it off at rest 7 he monitors O2sats w/ his home monitor (all>90%);  We will proceed w/ a f/u 2DEcho to recheck EF & PAsys;  DM is improved w/ A1c=6.7 & he is rec to STOP the Actos in light of his cardiac dis;  Finally his Hg is 15 on FeSO4 one daily- rec to continue w/ his Ferritin level. 10.29/18>   Tabias is stable w/ his heart & lung problems- Again rec to increase his exercise program & use his O2 2L/min Qhs and w/ activity     Plan:      Patient's Medications  New Prescriptions   No medications on file  Previous Medications   ACETAMINOPHEN-CODEINE (TYLENOL #3) 300-30 MG TABLET    Take 1 tablet by mouth every 8 (eight) hours as needed for moderate pain.   ASPIRIN EC 81 MG TABLET    Take 81 mg by mouth daily.   ATORVASTATIN (LIPITOR) 20 MG TABLET    Take 1 tablet (20 mg total) by mouth daily.   CALCIUM-MAGNESIUM-ZINC 1000-400-15 MG TABS    Take 1 tablet by mouth daily.    CARVEDILOL (COREG) 25 MG TABLET    TAKE ONE TABLET BY MOUTH TWICE DAILY WITH MEALS   CHOLECALCIFEROL (VITAMIN D) 1000 UNITS CAPSULE    Take 1,000 Units by mouth 3 (three) times daily.    CYANOCOBALAMIN (B-12 PO)    Take 1 tablet by mouth daily.    FERROUS SULFATE 325 (65 FE) MG EC TABLET    Take 325 mg by mouth daily with breakfast.   FUROSEMIDE (LASIX) 40 MG TABLET    TAKE ONE TABLET BY MOUTH ONCE DAILY   IBUPROFEN (ADVIL,MOTRIN) 200 MG TABLET    Take 200-300 mg by mouth 2 (two) times daily as needed. For pain   LANTUS SOLOSTAR 100 UNIT/ML SOLOSTAR PEN    INJECT 50 UNITS SUBCUTANEOUSLY ONCE DAILY   LOSARTAN (COZAAR) 50 MG TABLET    Take 1 tablet (50 mg total) by mouth daily.   LUTEIN 20 MG CAPS    Take 20 mg by mouth daily.   METFORMIN (GLUCOPHAGE) 850 MG TABLET    TAKE 1 TABLET BY MOUTH THREE TIMES DAILY   METHOCARBAMOL (ROBAXIN) 500 MG TABLET    TAKE ONE TABLET BY MOUTH ONCE DAILY AS NEEDED   MULTIPLE VITAMIN (MULTIVITAMIN WITH MINERALS) TABS    Take 1 tablet by mouth daily.   OMEGA 3 1000 MG CAPS    Take 1,000 mg by mouth 2 (two) times daily.    OMEPRAZOLE (PRILOSEC) 20 MG CAPSULE    TAKE 1 CAPSULE BY MOUTH TWICE DAILY .   PIOGLITAZONE (ACTOS) 30 MG TABLET    Take 1 tablet (30 mg total) by mouth daily.   RELION PEN NEEDLES 32G X 4 MM MISC    USE 1  ONCE DAILY TO ADMINISTER INSULIN  Modified Medications   No medications on file  Discontinued Medications   LANTUS SOLOSTAR 100 UNIT/ML SOLOSTAR PEN    INJECT 40 UNITS SUBCUTANEOUSLY ONCE DAILY

## 2017-02-23 ENCOUNTER — Other Ambulatory Visit: Payer: Self-pay | Admitting: Cardiovascular Disease

## 2017-02-25 ENCOUNTER — Encounter: Payer: Self-pay | Admitting: Cardiovascular Disease

## 2017-03-01 ENCOUNTER — Telehealth: Payer: Self-pay | Admitting: Cardiology

## 2017-03-01 NOTE — Telephone Encounter (Signed)
Confirmed remote transmission w/ pt wife.   

## 2017-03-04 ENCOUNTER — Ambulatory Visit: Payer: Medicare Other | Admitting: Cardiovascular Disease

## 2017-03-04 ENCOUNTER — Telehealth: Payer: Self-pay | Admitting: Cardiology

## 2017-03-04 ENCOUNTER — Encounter: Payer: Self-pay | Admitting: Cardiovascular Disease

## 2017-03-04 VITALS — BP 122/88 | HR 76 | Ht 68.0 in | Wt 189.0 lb

## 2017-03-04 DIAGNOSIS — I10 Essential (primary) hypertension: Secondary | ICD-10-CM

## 2017-03-04 DIAGNOSIS — I5022 Chronic systolic (congestive) heart failure: Secondary | ICD-10-CM

## 2017-03-04 DIAGNOSIS — E782 Mixed hyperlipidemia: Secondary | ICD-10-CM | POA: Diagnosis not present

## 2017-03-04 NOTE — Telephone Encounter (Signed)
Spoke w/ pt and requested that he send a manual transmission b/c his home monitor has not updated in at least 7 days.   

## 2017-03-04 NOTE — Progress Notes (Signed)
Terry Peck Date of Birth  07-03-1940       [address]    [place]    [place], [place]  [address]     [phone]  Fax  [phone]    Problem list: 1. Left bundle branch block 2. Diabetes mellitus 3. Hyperlipidemia 4. Hypertension 5. Congestive heart failure-EF equals 30-35% - has now 55%  with Bi-V pacer  6. Bi-V ICD placement 7. Idiopathic Pulmonary fibrosis    Previous notes:  Terry Peck is a 76 yo who presents for evaluation of dyspnea.  He has been found to have a LBBB and has CHF with an EF of 30-35%.  He gets dyspneac with exercise ( mowing the lawn).  He denies any PND or orthopnea. He describes generalized fatigue with exertion.  The shortness of breath has been present for about a year. He admits to eating a fair amount of salty foods. He eats hot dogs, ham, bacon, sausage. He does not use any exercise but instead uses potassium chloride ( No salt).  He was seen by his medical doctor and was found to have a left bundle branch block. and was referred here for further evaluation. It was at that point that he had an echocardiogram which revealed global left ventricular systolic dysfunction with an EF of 30-35%.  He had a stress Myoview study which reveals an ejection fraction 29%. There is global left ventricular dilatation. There is no evidence of ischemia. His symptoms have improved dramatically since we've stopped his verapamil and start him on carvedilol.  Continue to gradually titrate up his medications. We change his HCTZ to Lasix during his last visit. We also added lisinopril. He's feeling better.  He is able to do more of his normal activities without severe dyspnea.  December 10, 2011 He continues to gradually and steadily improved. He is now able to mow his lawn and edge  without any difficulties. He's doing quite well at cardiac rehabilitation to  October 17, 2012:  Terry Peck is doing great.  He has definitely seen a benefit from the BI-V pacer / ICD.  He's  able to do all of his yard work without significant difficulty or shortness of breath.  No chest pain.  We have not done a cardiac cath - he was not found to have significant ischemia on myoview and he has made tremendous progress with medical therapy for his CHF.   Echo in April , 2014 Study Conclusions  - Left ventricle: The cavity size was normal. Wall thickness was increased in a pattern of severe LVH. Systolic function was normal. The estimated ejection fraction was 55%. Wall motion was normal; there were no regional wall motion abnormalities. - Left atrium: The atrium was mildly dilated. - Atrial septum: No defect or patent foramen ovale was identified.  His LV EF has normalized with medical therapy and the ICD.   Dec. 2, 2014:  Terry Peck is doing well.  Exercising well.  No CP, no dyspnea.    November 02, 2013:  Terry Peck is doing well. He did not take his furosemide yesterday or today and so his blood pressure is a little bit higher. He takes his blood pressure on a regular basis at home and his readings are in the normal range.  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 13, 2016 Terry Peck is a 76 yo with hx of chronic systolic CHF.  He is  s/p Bi - V  ICD placement.   His EF has normalized since placement of the Bi-V ICD.  He is feeling much better. Exercising and doing all that he can do.   He now has a Optivol ( or equivalent)  Feature enabled .  Volume status is good.   November 01, 2014:  Has some DOE recently .  Very mild  Not eating any extra salt.   Oct. 27, 2016:  Terry Peck is doing well. Has normalized his LV function with the Bi-V pacer   August 16, 2015:  Terry Peck is seen back for follow up of his chf BP is elevated. Getting more normal readings at home  EF has normalized with his BI-V pacer   Oct. 23, 2017:   Doing well from a cardiac standpoint Busy doing yard work .  He had stopped his atorvastatin to see if that would  affect his blood glucose. -   His glucose was lower when he was off the statin . Lipids went up significantly .   August 17, 2016:  Terry Peck is doing well His Jerel Shepherd showed some possible fluid accumulation but the impedance was normal .  He is feeling great.  No shortness of breath   Nov. 12, 2018  Terry Peck is seen today for follow up of his CHF  Glucose has been good  EF has now normalized to 56-38% Grade 1 diastolic dysfunction      Current Outpatient Medications on File Prior to Visit  Medication Sig Dispense Refill  . acetaminophen-codeine (TYLENOL #3) 300-30 MG tablet Take 1 tablet by mouth every 8 (eight) hours as needed for moderate pain. 30 tablet 0  . aspirin EC 81 MG tablet Take 81 mg by mouth daily.    Marland Kitchen atorvastatin (LIPITOR) 20 MG tablet TAKE ONE TABLET BY MOUTH  DAILY 90 tablet 2  . Calcium-Magnesium-Zinc 1000-400-15 MG TABS Take 1 tablet by mouth daily.     . carvedilol (COREG) 25 MG tablet TAKE ONE TABLET BY MOUTH TWICE DAILY WITH MEALS 180 tablet 3  . Cholecalciferol (VITAMIN D) 1000 UNITS capsule Take 1,000 Units by mouth 3 (three) times daily.     . Cyanocobalamin (B-12 PO) Take 1 tablet by mouth daily.     . ferrous sulfate 325 (65 FE) MG EC tablet Take 325 mg by mouth daily with breakfast.    . furosemide (LASIX) 40 MG tablet TAKE ONE TABLET BY MOUTH ONCE DAILY 30 tablet 11  . ibuprofen (ADVIL,MOTRIN) 200 MG tablet Take 200-300 mg by mouth 2 (two) times daily as needed. For pain    . LANTUS SOLOSTAR 100 UNIT/ML Solostar Pen INJECT 50 UNITS SUBCUTANEOUSLY ONCE DAILY 5 pen 11  . Lutein 20 MG CAPS Take 20 mg by mouth daily.    . metFORMIN (GLUCOPHAGE) 850 MG tablet TAKE 1 TABLET BY MOUTH THREE TIMES DAILY 270 tablet 1  . methocarbamol (ROBAXIN) 500 MG tablet TAKE ONE TABLET BY MOUTH ONCE DAILY AS NEEDED 30 tablet 0  . Multiple Vitamin (MULTIVITAMIN WITH MINERALS) TABS Take 1 tablet by mouth daily.    . OMEGA 3 1000 MG CAPS Take 1,000 mg by mouth 2 (two) times daily.       Marland Kitchen omeprazole (PRILOSEC) 20 MG capsule TAKE 1 CAPSULE BY MOUTH TWICE DAILY . 180 capsule 3  . pioglitazone (ACTOS) 30 MG tablet Take 1 tablet (30 mg total) by mouth daily. 90 tablet 3  . RELION PEN NEEDLES 32G X 4 MM MISC USE 1  ONCE  DAILY TO ADMINISTER INSULIN 50 each 0  . losartan (COZAAR) 50 MG tablet Take 1 tablet (50 mg total) by mouth daily. 90 tablet 3  . [DISCONTINUED] simvastatin (ZOCOR) 80 MG tablet Take 1 tablet (80 mg total) by mouth at bedtime. 90 tablet 3   No current facility-administered medications on file prior to visit.     Allergies  Allergen Reactions  . Penicillins Anaphylaxis and Other (See Comments)    "swelling of eyes; throat; could breath good" (01/29/2012)  . Iodine Other (See Comments)    "if I get iodine dye, I'll get a fever" (01/29/2012)  . Fish Oil Itching    Pt said only when given through IV    Past Medical History:  Diagnosis Date  . Abnormal ANCA test 07/03/2011  . Allergy   . Arthritis    "mild; right shoulder" (01/29/2012)  . B12 deficiency anemia   . Barrett's esophagus 11/23/2010  . Bundle branch block, left   . Cataract    L eye surgery  . CHF (congestive heart failure) ([place])    pacemaker  . Chronic systolic dysfunction of left ventricle    EF 30%  . Colon polyps 11/23/2010  . GERD (gastroesophageal reflux disease)   . Hyperlipidemia 10/14/2012  . Hypertension   . ICD (implantable cardiac defibrillator) in place   . Iron deficiency anemia 05/30/2011  . Liver abscess 1980's  . Nonischemic cardiomyopathy ([place])    s/p St. Jude BiV ICD 01/29/12  . Pulmonary fibrosis ([place])   . Pulmonary nodules   . Shortness of breath    "related to heart being out of rhythm" (01/29/2012)  . Type II diabetes mellitus ([place])     Past Surgical History:  Procedure Laterality Date  . abscess drained  ?1980's   liver  . CARDIAC DEFIBRILLATOR PLACEMENT  01/29/2012   SJM Quadra Assura BiV ICD implanted by Dr Rayann Heman  . CATARACT EXTRACTION Left   . COLONOSCOPY     . TONSILLECTOMY  1952  . UPPER GI ENDOSCOPY      Social History   Tobacco Use  Smoking Status Former Smoker  . Packs/day: 0.50  . Years: 10.00  . Pack years: 5.00  . Types: Cigarettes  . Last attempt to quit: 04/23/1988  . Years since quitting: 28.8  Smokeless Tobacco Never Used    Social History   Substance and Sexual Activity  Alcohol Use No  . Alcohol/week: 0.0 oz    Family History  Problem Relation Age of Onset  . Heart disease Mother   . Colon cancer Neg Hx   . Esophageal cancer Neg Hx   . Stomach cancer Neg Hx     Reviw of Systems:  Reviewed in the HPI.  All other systems are negative.  Physical Exam: Blood pressure 122/88, pulse 76, height 5\' 8"  (1.727 m), weight 189 lb (85.7 kg), SpO2 94 %.  GEN:  Well nourished, well developed in no acute distress HEENT: Normal NECK: No JVD; No carotid bruits LYMPHATICS: No lymphadenopathy CARDIAC: RR, no murmurs, rubs, gallops RESPIRATORY:  Clear to auscultation without rales, wheezing or rhonchi  ABDOMEN: Soft, non-tender, non-distended MUSCULOSKELETAL:  No edema; No deformity  SKIN: Warm and dry NEUROLOGIC:  Alert and oriented x 3   ECG:    Assessment / Plan:   1. Left bundle branch block-  S/p Bi-V pacer ,  Doing well.  2. Diabetes mellitus 3. Hyperlipidemia -  He's been on atorvastatin 40 mg a day for some time. Levels look  good   4. Hypertension - BP is well controlled.   5. Congestive heart failure-    His EF had improved from 30% to 560 by echo in 2018.       He is doing great.  Out doing yard work regularly . Continue meds   6. Bi-V ICD placement -   Followed by Dr. Rayann Heman.  Doing great.     Mertie Moores, MD  03/04/2017 3:37 PM    [address] [place] [address],  [place], [place]  [address] Pager [phone] Phone: [phone]; Fax: [phone]

## 2017-03-04 NOTE — Patient Instructions (Signed)
Medication Instructions:  Your physician recommends that you continue on your current medications as directed. Please refer to the Current Medication list given to you today.   Labwork: TODAY - basic metabolic panel   Testing/Procedures: None Ordered   Follow-Up: Your physician wants you to follow-up in: 6 months with Dr. Nahser. You will receive a reminder letter in the mail two months in advance. If you don't receive a letter, please call our office to schedule the follow-up appointment.   If you need a refill on your cardiac medications before your next appointment, please call your pharmacy.   Thank you for choosing [facility]! Michelle Swinyer, RN [phone]    

## 2017-03-05 LAB — BASIC METABOLIC PANEL
BUN / CREAT RATIO: 15 (ref 10–24)
BUN: 14 mg/dL (ref 8–27)
CALCIUM: 9.4 mg/dL (ref 8.6–10.2)
CHLORIDE: 103 mmol/L (ref 96–106)
CO2: 22 mmol/L (ref 20–29)
CREATININE: 0.91 mg/dL (ref 0.76–1.27)
GFR, EST AFRICAN AMERICAN: 94 mL/min/{1.73_m2} (ref >59)
GFR, EST NON AFRICAN AMERICAN: 82 mL/min/{1.73_m2} (ref >59)
Glucose: 113 mg/dL — ABNORMAL HIGH (ref 65–99)
Potassium: 4.6 mmol/L (ref 3.5–5.2)
Sodium: 144 mmol/L (ref 134–144)

## 2017-03-09 DIAGNOSIS — J8410 Pulmonary fibrosis, unspecified: Secondary | ICD-10-CM | POA: Diagnosis not present

## 2017-03-09 DIAGNOSIS — I110 Hypertensive heart disease with heart failure: Secondary | ICD-10-CM | POA: Diagnosis not present

## 2017-03-09 DIAGNOSIS — J84112 Idiopathic pulmonary fibrosis: Secondary | ICD-10-CM | POA: Diagnosis not present

## 2017-03-09 DIAGNOSIS — I5022 Chronic systolic (congestive) heart failure: Secondary | ICD-10-CM | POA: Diagnosis not present

## 2017-03-12 ENCOUNTER — Telehealth: Payer: Self-pay | Admitting: Cardiology

## 2017-03-12 NOTE — Telephone Encounter (Signed)
LMOVM requesting that pt send manual transmission b/c home monitor has not updated in at least 7 days.    

## 2017-03-22 ENCOUNTER — Telehealth: Payer: Self-pay | Admitting: Cardiology

## 2017-03-22 NOTE — Telephone Encounter (Signed)
Spoke w/ pt and requested that he send a manual transmission b/c his home monitor has not updated in at least 7 days.   

## 2017-03-29 ENCOUNTER — Telehealth: Payer: Self-pay | Admitting: Cardiology

## 2017-03-29 ENCOUNTER — Encounter: Payer: Self-pay | Admitting: Cardiovascular Disease

## 2017-03-29 ENCOUNTER — Telehealth: Payer: Self-pay

## 2017-03-29 NOTE — Telephone Encounter (Signed)
LMOVM requesting that pt send manual transmission b/c home monitor has not updated in at least 7 days.    

## 2017-03-29 NOTE — Telephone Encounter (Signed)
Received message that patient was not able to get [name] monitor to work.  Call to patient.  He said he received a new monitor and it still will not send a transmission.  He has a land line monitor. Asked if a cell phone works inside his house and he said yes.   Advised him to call [facility] service number and ask for wireless adapter.  She said he will call next week.  He is feeling fine at this time.   Rescheduled remote transmission for 04/11/2017

## 2017-04-08 ENCOUNTER — Other Ambulatory Visit: Payer: Self-pay | Admitting: Internal Medicine

## 2017-04-08 DIAGNOSIS — I5022 Chronic systolic (congestive) heart failure: Secondary | ICD-10-CM | POA: Diagnosis not present

## 2017-04-08 DIAGNOSIS — I110 Hypertensive heart disease with heart failure: Secondary | ICD-10-CM | POA: Diagnosis not present

## 2017-04-08 DIAGNOSIS — J84112 Idiopathic pulmonary fibrosis: Secondary | ICD-10-CM | POA: Diagnosis not present

## 2017-04-08 DIAGNOSIS — J8410 Pulmonary fibrosis, unspecified: Secondary | ICD-10-CM | POA: Diagnosis not present

## 2017-04-10 ENCOUNTER — Encounter: Payer: Self-pay | Admitting: Cardiovascular Disease

## 2017-04-11 ENCOUNTER — Encounter: Payer: Medicare Other | Admitting: *Deleted

## 2017-04-18 NOTE — Progress Notes (Signed)
No ICM remote transmission received for 04/11/2017 and next ICM transmission scheduled for 05/09/2017.    

## 2017-04-19 ENCOUNTER — Encounter: Payer: Self-pay | Admitting: Cardiology

## 2017-04-23 ENCOUNTER — Other Ambulatory Visit: Payer: Self-pay | Admitting: Cardiovascular Disease

## 2017-04-24 ENCOUNTER — Encounter: Payer: Self-pay | Admitting: Internal Medicine

## 2017-04-24 ENCOUNTER — Encounter: Payer: Self-pay | Admitting: Cardiovascular Disease

## 2017-04-24 MED ORDER — FUROSEMIDE 40 MG PO TABS: 40.0000 mg | ORAL_TABLET | Freq: Every day | ORAL | 11 refills | Status: DC

## 2017-04-25 ENCOUNTER — Telehealth: Payer: Self-pay

## 2017-04-25 NOTE — Telephone Encounter (Signed)
Spoke with patient and explained that his transmitter is up to date but we did not receive the most recent transmission he attempted to send. Patient will attempt a send a second manual transmission - he requests that I message him in my chart to notify him of a successful transmission.

## 2017-05-07 ENCOUNTER — Ambulatory Visit: Payer: Medicare Other | Admitting: Cardiovascular Disease

## 2017-05-09 ENCOUNTER — Ambulatory Visit (INDEPENDENT_AMBULATORY_CARE_PROVIDER_SITE_OTHER): Payer: Medicare Other

## 2017-05-09 DIAGNOSIS — J8410 Pulmonary fibrosis, unspecified: Secondary | ICD-10-CM | POA: Diagnosis not present

## 2017-05-09 DIAGNOSIS — I5022 Chronic systolic (congestive) heart failure: Secondary | ICD-10-CM

## 2017-05-09 DIAGNOSIS — J84112 Idiopathic pulmonary fibrosis: Secondary | ICD-10-CM | POA: Diagnosis not present

## 2017-05-09 DIAGNOSIS — Z95810 Presence of automatic (implantable) cardiac defibrillator: Secondary | ICD-10-CM | POA: Diagnosis not present

## 2017-05-09 DIAGNOSIS — I110 Hypertensive heart disease with heart failure: Secondary | ICD-10-CM | POA: Diagnosis not present

## 2017-05-09 NOTE — Progress Notes (Signed)
EPIC Encounter for ICM Monitoring  Patient Name: Terry Peck is a 77 y.o. male Date: 05/09/2017 Primary Care Physican: Biagio Borg, MD Primary Cardiologist:Nahser Electrophysiologist: Allred Dry Weight:150 lbs Bi-V Pacing: >99%        Heart Failure questions reviewed, pt asymptomatic.   Thoracic impedance normal.  Prescribed dosage: Furosemide 40 mg 1 tablet daily  Labs: 03/04/2017 Creatinine 0.91, BUN 14, Potassium 4.6, Sodium 144, EGFR 82-94 11/20/2016 Creatinine 0.84, BUN 19, Potassium 4.9, Sodium 141, EGFR 85-98 09/07/2016 Creatinine 0.84, BUN 19, Potassium 4.9, Sodium 141 08/16/2016 Creatinine 0.99, BUN 24, Potassium 4.7, Sodium 143 07/17/2016 Creatinine 0.86, BUN 54, Potassium 4.8, Sodium 141 02/09/2016 Creatinine 0.79, BUN 15, Potassium 5.1, Sodium 140 11/09/2015 Creatinine 0.82, BUN 18, Potassium 4.1, Sodium 138, EGFR 97.26   Recommendations: No changes.   Encouraged to call for fluid symptoms.  Follow-up plan: ICM clinic phone appointment on 06/10/2017.    Copy of ICM check sent to Dr. Rayann Heman.   3 month ICM trend: 05/09/2017    1 Year ICM trend:       Rosalene Billings, RN 05/09/2017 8:31 AM

## 2017-05-28 ENCOUNTER — Other Ambulatory Visit (INDEPENDENT_AMBULATORY_CARE_PROVIDER_SITE_OTHER): Payer: Medicare Other

## 2017-05-28 ENCOUNTER — Ambulatory Visit (INDEPENDENT_AMBULATORY_CARE_PROVIDER_SITE_OTHER): Payer: Medicare Other | Admitting: Internal Medicine

## 2017-05-28 ENCOUNTER — Encounter: Payer: Self-pay | Admitting: Internal Medicine

## 2017-05-28 VITALS — BP 128/86 | HR 71 | Temp 97.6°F | Ht 68.0 in | Wt 183.0 lb

## 2017-05-28 DIAGNOSIS — Z0000 Encounter for general adult medical examination without abnormal findings: Secondary | ICD-10-CM

## 2017-05-28 DIAGNOSIS — E118 Type 2 diabetes mellitus with unspecified complications: Secondary | ICD-10-CM | POA: Diagnosis not present

## 2017-05-28 DIAGNOSIS — Z794 Long term (current) use of insulin: Secondary | ICD-10-CM

## 2017-05-28 LAB — URINALYSIS, ROUTINE W REFLEX MICROSCOPIC
Bilirubin Urine: NEGATIVE
HGB URINE DIPSTICK: NEGATIVE
Ketones, ur: NEGATIVE
Leukocytes, UA: NEGATIVE
NITRITE: NEGATIVE
SPECIFIC GRAVITY, URINE: 1.015 (ref 1.000–1.030)
Total Protein, Urine: NEGATIVE
URINE GLUCOSE: NEGATIVE
Urobilinogen, UA: 0.2 (ref 0.0–1.0)
pH: 8 (ref 5.0–8.0)

## 2017-05-28 LAB — CBC WITH DIFFERENTIAL/PLATELET
BASOS PCT: 0.9 % (ref 0.0–3.0)
Basophils Absolute: 0.1 10*3/uL (ref 0.0–0.1)
EOS ABS: 0.2 10*3/uL (ref 0.0–0.7)
EOS PCT: 3 % (ref 0.0–5.0)
HCT: 47.4 % (ref 39.0–52.0)
Hemoglobin: 15.7 g/dL (ref 13.0–17.0)
Lymphocytes Relative: 24.1 % (ref 12.0–46.0)
Lymphs Abs: 1.8 10*3/uL (ref 0.7–4.0)
MCHC: 33.2 g/dL (ref 30.0–36.0)
MCV: 93.3 fl (ref 78.0–100.0)
MONO ABS: 0.7 10*3/uL (ref 0.1–1.0)
Monocytes Relative: 8.9 % (ref 3.0–12.0)
NEUTROS PCT: 63.1 % (ref 43.0–77.0)
Neutro Abs: 4.7 10*3/uL (ref 1.4–7.7)
PLATELETS: 261 10*3/uL (ref 150.0–400.0)
RBC: 5.08 Mil/uL (ref 4.22–5.81)
RDW: 15.3 % (ref 11.5–15.5)
WBC: 7.4 10*3/uL (ref 4.0–10.5)

## 2017-05-28 LAB — MICROALBUMIN / CREATININE URINE RATIO
Creatinine,U: 56.9 mg/dL
MICROALB UR: 1.2 mg/dL (ref 0.0–1.9)
Microalb Creat Ratio: 2.1 mg/g (ref 0.0–30.0)

## 2017-05-28 LAB — HEPATIC FUNCTION PANEL
ALBUMIN: 4.2 g/dL (ref 3.5–5.2)
ALK PHOS: 42 U/L (ref 39–117)
ALT: 27 U/L (ref 0–53)
AST: 26 U/L (ref 0–37)
Bilirubin, Direct: 0.1 mg/dL (ref 0.0–0.3)
Total Bilirubin: 0.5 mg/dL (ref 0.2–1.2)
Total Protein: 7.4 g/dL (ref 6.0–8.3)

## 2017-05-28 LAB — BASIC METABOLIC PANEL
BUN: 18 mg/dL (ref 6–23)
CHLORIDE: 102 meq/L (ref 96–112)
CO2: 30 mEq/L (ref 19–32)
CREATININE: 0.82 mg/dL (ref 0.40–1.50)
Calcium: 9.6 mg/dL (ref 8.4–10.5)
GFR: 96.86 mL/min (ref >60.00)
Glucose, Bld: 79 mg/dL (ref 70–99)
Potassium: 4.7 mEq/L (ref 3.5–5.1)
Sodium: 141 mEq/L (ref 135–145)

## 2017-05-28 LAB — LIPID PANEL
CHOL/HDL RATIO: 2
Cholesterol: 111 mg/dL (ref 0–200)
HDL: 48.3 mg/dL (ref >39.00)
LDL CALC: 37 mg/dL (ref 0–99)
NONHDL: 62.75
TRIGLYCERIDES: 128 mg/dL (ref 0.0–149.0)
VLDL: 25.6 mg/dL (ref 0.0–40.0)

## 2017-05-28 LAB — HEMOGLOBIN A1C: HEMOGLOBIN A1C: 6.7 % — AB (ref 4.6–6.5)

## 2017-05-28 LAB — TSH: TSH: 2.93 u[IU]/mL (ref 0.35–4.50)

## 2017-05-28 LAB — PSA: PSA: 0.29 ng/mL (ref 0.10–4.00)

## 2017-05-28 MED ORDER — ZOSTER VAC RECOMB ADJUVANTED 50 MCG/0.5ML IM SUSR: 0.5000 mL | Freq: Once | INTRAMUSCULAR | 1 refills | Status: AC

## 2017-05-28 NOTE — Patient Instructions (Addendum)
Your shingles shot was sent to the pharmacy  OK to cut back on lantus to 35 units per day  Please continue all other medications as before, and refills have been done if requested.  Please have the pharmacy call with any other refills you may need.  Please continue your efforts at being more active, low cholesterol diet, and weight control.  You are otherwise up to date with prevention measures today.  Please keep your appointments with your specialists as you may have planned  Please go to the LAB in the Basement (turn left off the elevator) for the tests to be done today  You will be contacted by phone if any changes need to be made immediately.  Otherwise, you will receive a letter about your results with an explanation, but please check with MyChart first.  Please remember to sign up for MyChart if you have not done so, as this will be important to you in the future with finding out test results, communicating by private email, and scheduling acute appointments online when needed.  Please return in 6 months, or sooner if needed, with Lab testing done 3-5 days before

## 2017-05-28 NOTE — Progress Notes (Signed)
Subjective:    Patient ID: Terry Peck, male    DOB: [birth year], 76 y.o.   MRN: [id number]  HPI  Here for wellness and f/u;  Overall doing ok;  Pt denies Chest pain, worsening SOB, DOE, wheezing, orthopnea, PND, worsening LE edema, palpitations, dizziness or syncope.  Pt denies neurological change such as new headache, facial or extremity weakness.  Pt denies polydipsia, polyuria, or low sugar symptoms. Pt states overall good compliance with treatment and medications, good tolerability, and has been trying to follow appropriate diet.  Pt denies worsening depressive symptoms, suicidal ideation or panic. No fever, night sweats, wt loss, loss of appetite, or other constitutional symptoms.  Pt states good ability with ADL's, has low fall risk, home safety reviewed and adequate, no other significant changes in hearing or vision, and only occasionally active with exercise.  Has home o2 for prn use such as using the port o2 for mowing the grass.  No AICD discharges.  No other new complaints or interval hx Past Medical History:  Diagnosis Date  . Abnormal ANCA test 07/03/2011  . Allergy   . Arthritis    "mild; right shoulder" (01/29/2012)  . B12 deficiency anemia   . Barrett's esophagus 11/23/2010  . Bundle branch block, left   . Cataract    L eye surgery  . CHF (congestive heart failure) ([place])    pacemaker  . Chronic systolic dysfunction of left ventricle    EF 30%  . Colon polyps 11/23/2010  . GERD (gastroesophageal reflux disease)   . Hyperlipidemia 10/14/2012  . Hypertension   . ICD (implantable cardiac defibrillator) in place   . Iron deficiency anemia 05/30/2011  . Liver abscess 1980's  . Nonischemic cardiomyopathy ([place])    s/p St. Jude BiV ICD 01/29/12  . Pulmonary fibrosis ([place])   . Pulmonary nodules   . Shortness of breath    "related to heart being out of rhythm" (01/29/2012)  . Type II diabetes mellitus ([place])    Past Surgical History:  Procedure Laterality Date  . abscess drained   ?1980's   liver  . BI-VENTRICULAR IMPLANTABLE CARDIOVERTER DEFIBRILLATOR N/A 01/29/2012   Procedure: BI-VENTRICULAR IMPLANTABLE CARDIOVERTER DEFIBRILLATOR  (CRT-D);  Surgeon: Thompson Grayer, MD;  Location: [facility] CATH LAB;  Service: Cardiovascular;  Laterality: N/A;  . CARDIAC DEFIBRILLATOR PLACEMENT  01/29/2012   SJM Quadra Assura BiV ICD implanted by Dr Rayann Heman  . CATARACT EXTRACTION Left   . COLONOSCOPY    . TONSILLECTOMY  1952  . UPPER GI ENDOSCOPY      reports that he quit smoking about 29 years ago. His smoking use included cigarettes. He has a 5.00 pack-year smoking history. he has never used smokeless tobacco. He reports that he does not drink alcohol or use drugs. family history includes Heart disease in his mother. Allergies  Allergen Reactions  . Penicillins Anaphylaxis and Other (See Comments)    "swelling of eyes; throat; could breath good" (01/29/2012)  . Iodine Other (See Comments)    "if I get iodine dye, I'll get a fever" (01/29/2012)  . Fish Oil Itching    Pt said only when given through IV   Current Outpatient Medications on File Prior to Visit  Medication Sig Dispense Refill  . acetaminophen-codeine (TYLENOL #3) 300-30 MG tablet Take 1 tablet by mouth every 8 (eight) hours as needed for moderate pain. 30 tablet 0  . aspirin EC 81 MG tablet Take 81 mg by mouth daily.    Marland Kitchen atorvastatin (LIPITOR) 20  MG tablet TAKE ONE TABLET BY MOUTH  DAILY 90 tablet 2  . Calcium-Magnesium-Zinc 1000-400-15 MG TABS Take 1 tablet by mouth daily.     . carvedilol (COREG) 25 MG tablet TAKE ONE TABLET BY MOUTH TWICE DAILY WITH MEALS 180 tablet 3  . Cholecalciferol (VITAMIN D) 1000 UNITS capsule Take 1,000 Units by mouth 3 (three) times daily.     . Cyanocobalamin (B-12 PO) Take 1 tablet by mouth daily.     . ferrous sulfate 325 (65 FE) MG EC tablet Take 325 mg by mouth daily with breakfast.    . furosemide (LASIX) 40 MG tablet Take 1 tablet (40 mg total) by mouth daily. 30 tablet 11  . ibuprofen  (ADVIL,MOTRIN) 200 MG tablet Take 200-300 mg by mouth 2 (two) times daily as needed. For pain    . LANTUS SOLOSTAR 100 UNIT/ML Solostar Pen INJECT 50 UNITS SUBCUTANEOUSLY ONCE DAILY 5 pen 11  . Lutein 20 MG CAPS Take 20 mg by mouth daily.    . metFORMIN (GLUCOPHAGE) 850 MG tablet TAKE 1 TABLET BY MOUTH THREE TIMES DAILY 270 tablet 1  . methocarbamol (ROBAXIN) 500 MG tablet TAKE ONE TABLET BY MOUTH ONCE DAILY AS NEEDED 30 tablet 0  . Multiple Vitamin (MULTIVITAMIN WITH MINERALS) TABS Take 1 tablet by mouth daily.    . OMEGA 3 1000 MG CAPS Take 1,000 mg by mouth 2 (two) times daily.     Marland Kitchen omeprazole (PRILOSEC) 20 MG capsule TAKE 1 CAPSULE BY MOUTH TWICE DAILY . 180 capsule 3  . pioglitazone (ACTOS) 30 MG tablet Take 1 tablet (30 mg total) by mouth daily. 90 tablet 3  . RELION PEN NEEDLES 32G X 4 MM MISC USE 1  ONCE DAILY TO  ADMINISTER  INSULIN 50 each 0  . losartan (COZAAR) 50 MG tablet Take 1 tablet (50 mg total) by mouth daily. 90 tablet 3  . [DISCONTINUED] simvastatin (ZOCOR) 80 MG tablet Take 1 tablet (80 mg total) by mouth at bedtime. 90 tablet 3   No current facility-administered medications on file prior to visit.    Review of Systems Constitutional: Negative for other unusual diaphoresis, sweats, appetite or weight changes HENT: Negative for other worsening hearing loss, ear pain, facial swelling, mouth sores or neck stiffness.   Eyes: Negative for other worsening pain, redness or other visual disturbance.  Respiratory: Negative for other stridor or swelling Cardiovascular: Negative for other palpitations or other chest pain  Gastrointestinal: Negative for worsening diarrhea or loose stools, blood in stool, distention or other pain Genitourinary: Negative for hematuria, flank pain or other change in urine volume.  Musculoskeletal: Negative for myalgias or other joint swelling.  Skin: Negative for other color change, or other wound or worsening drainage.  Neurological: Negative for  other syncope or numbness. Hematological: Negative for other adenopathy or swelling Psychiatric/Behavioral: Negative for hallucinations, other worsening agitation, SI, self-injury, or new decreased concentration No other new complaints    Objective:   Physical Exam BP 128/86   Pulse 71   Temp 97.6 F (36.4 C) (Oral)   Ht 5\' 8"  (1.727 m)   Wt 183 lb (83 kg)   SpO2 96%   BMI 27.83 kg/m  VS noted,  Constitutional: Pt is oriented to person, place, and time. Appears well-developed and well-nourished, in no significant distress and comfortable Head: Normocephalic and atraumatic  Eyes: Conjunctivae and EOM are normal. Pupils are equal, round, and reactive to light Right Ear: External ear normal without discharge Left Ear: External ear  normal without discharge Nose: Nose without discharge or deformity Mouth/Throat: Oropharynx is without other ulcerations and moist  Neck: Normal range of motion. Neck supple. No JVD present. No tracheal deviation present or significant neck LA or mass Cardiovascular: Normal rate, regular rhythm, normal heart sounds and intact distal pulses.   Pulmonary/Chest: WOB normal and breath sounds without rales or wheezing  Abdominal: Soft. Bowel sounds are normal. NT. No HSM  Musculoskeletal: Normal range of motion. Exhibits no edema Lymphadenopathy: Has no other cervical adenopathy.  Neurological: Pt is alert and oriented to person, place, and time. Pt has normal reflexes. No cranial nerve deficit. Motor grossly intact, Gait intact Skin: Skin is warm and dry. No rash noted or new ulcerations Psychiatric:  Has normal mood and affect. Behavior is normal without agitation No other exam findings  Lab Results  Component Value Date   WBC 9.2 11/20/2016   HGB 14.4 11/20/2016   HCT 42.7 11/20/2016   PLT 253.0 11/20/2016   GLUCOSE 113 (H) 03/04/2017   CHOL 107 11/20/2016   TRIG 94.0 11/20/2016   HDL 40.00 11/20/2016   LDLDIRECT 179.2 04/10/2013   LDLCALC 48  11/20/2016   ALT 37 11/20/2016   AST 34 11/20/2016   NA 144 03/04/2017   K 4.6 03/04/2017   CL 103 03/04/2017   CREATININE 0.91 03/04/2017   BUN 14 03/04/2017   CO2 22 03/04/2017   TSH 3.52 11/20/2016   PSA 0.26 11/20/2016   HGBA1C 6.3 11/20/2016   MICROALBUR 1.7 11/17/2014       Assessment & Plan:

## 2017-05-28 NOTE — Assessment & Plan Note (Signed)
Becoming mild overcontrolled it seems, will reduce lantus to 35 unit from current 40 units daily

## 2017-05-29 NOTE — Assessment & Plan Note (Signed)

## 2017-06-03 ENCOUNTER — Other Ambulatory Visit: Payer: Self-pay | Admitting: Internal Medicine

## 2017-06-09 DIAGNOSIS — I5022 Chronic systolic (congestive) heart failure: Secondary | ICD-10-CM | POA: Diagnosis not present

## 2017-06-09 DIAGNOSIS — J8410 Pulmonary fibrosis, unspecified: Secondary | ICD-10-CM | POA: Diagnosis not present

## 2017-06-09 DIAGNOSIS — J84112 Idiopathic pulmonary fibrosis: Secondary | ICD-10-CM | POA: Diagnosis not present

## 2017-06-09 DIAGNOSIS — I110 Hypertensive heart disease with heart failure: Secondary | ICD-10-CM | POA: Diagnosis not present

## 2017-06-21 NOTE — Progress Notes (Signed)
No ICM remote transmission received for 06/10/2017 and next ICM transmission scheduled for 07/11/2017.

## 2017-06-26 DIAGNOSIS — Z961 Presence of intraocular lens: Secondary | ICD-10-CM | POA: Diagnosis not present

## 2017-06-26 DIAGNOSIS — H5213 Myopia, bilateral: Secondary | ICD-10-CM | POA: Diagnosis not present

## 2017-06-26 DIAGNOSIS — H35371 Puckering of macula, right eye: Secondary | ICD-10-CM | POA: Diagnosis not present

## 2017-06-26 DIAGNOSIS — E113293 Type 2 diabetes mellitus with mild nonproliferative diabetic retinopathy without macular edema, bilateral: Secondary | ICD-10-CM | POA: Diagnosis not present

## 2017-06-26 LAB — HM DIABETES EYE EXAM

## 2017-06-27 ENCOUNTER — Telehealth: Payer: Self-pay

## 2017-06-27 NOTE — Telephone Encounter (Signed)
error 

## 2017-07-07 DIAGNOSIS — J84112 Idiopathic pulmonary fibrosis: Secondary | ICD-10-CM | POA: Diagnosis not present

## 2017-07-07 DIAGNOSIS — I5022 Chronic systolic (congestive) heart failure: Secondary | ICD-10-CM | POA: Diagnosis not present

## 2017-07-07 DIAGNOSIS — I110 Hypertensive heart disease with heart failure: Secondary | ICD-10-CM | POA: Diagnosis not present

## 2017-07-07 DIAGNOSIS — J8410 Pulmonary fibrosis, unspecified: Secondary | ICD-10-CM | POA: Diagnosis not present

## 2017-07-11 ENCOUNTER — Ambulatory Visit (INDEPENDENT_AMBULATORY_CARE_PROVIDER_SITE_OTHER): Payer: Medicare Other

## 2017-07-11 DIAGNOSIS — Z95810 Presence of automatic (implantable) cardiac defibrillator: Secondary | ICD-10-CM

## 2017-07-11 DIAGNOSIS — I5022 Chronic systolic (congestive) heart failure: Secondary | ICD-10-CM

## 2017-07-11 NOTE — Progress Notes (Signed)
EPIC Encounter for ICM Monitoring  Patient Name: Terry Peck is a 77 y.o. male Date: 07/11/2017 Primary Care Physican: Biagio Borg, MD Primary Cardiologist:Nahser Electrophysiologist: Allred Dry Weight:178 lbs Bi-V Pacing: >99%       Heart Failure questions reviewed, pt asymptomatic.   Thoracic impedance normal.  Impedance suggested fluid accumulation 06/23/2017 through 06/26/2017 and 06/29/2017 through 07/02/2017  Prescribed dosage: Furosemide 40 mg 1 tablet daily  Labs: 05/28/2017 Creatinine 0.82, BUN 18, Potassium 4.7, Sodium 141, EGFR 96.86 03/04/2017 Creatinine 0.91, BUN 14, Potassium 4.6, Sodium 144, EGFR 82-94 11/20/2016 Creatinine 0.84, BUN 19, Potassium 4.9, Sodium 141, EGFR 85-98 09/07/2016 Creatinine 0.84, BUN 19, Potassium 4.9, Sodium 141 08/16/2016 Creatinine 0.99, BUN 24, Potassium 4.7, Sodium 143 07/17/2016 Creatinine 0.86, BUN 54, Potassium 4.8, Sodium 141 02/09/2016 Creatinine 0.79, BUN 15, Potassium 5.1, Sodium 140 11/09/2015 Creatinine 0.82, BUN 18, Potassium 4.1, Sodium 138, EGFR 97.26  Recommendations: No changes.  Encouraged to call for fluid symptoms.  Follow-up plan: ICM clinic phone appointment on 08/12/2017.    Copy of ICM check sent to Dr. Rayann Heman.   3 month ICM trend: 07/11/2017    1 Year ICM trend:       Rosalene Billings, RN 07/11/2017 11:21 AM

## 2017-07-29 ENCOUNTER — Other Ambulatory Visit: Payer: Self-pay | Admitting: Internal Medicine

## 2017-08-07 DIAGNOSIS — J8410 Pulmonary fibrosis, unspecified: Secondary | ICD-10-CM | POA: Diagnosis not present

## 2017-08-07 DIAGNOSIS — J84112 Idiopathic pulmonary fibrosis: Secondary | ICD-10-CM | POA: Diagnosis not present

## 2017-08-07 DIAGNOSIS — I110 Hypertensive heart disease with heart failure: Secondary | ICD-10-CM | POA: Diagnosis not present

## 2017-08-07 DIAGNOSIS — I5022 Chronic systolic (congestive) heart failure: Secondary | ICD-10-CM | POA: Diagnosis not present

## 2017-08-12 ENCOUNTER — Telehealth: Payer: Self-pay | Admitting: Cardiology

## 2017-08-12 ENCOUNTER — Ambulatory Visit (INDEPENDENT_AMBULATORY_CARE_PROVIDER_SITE_OTHER): Payer: Medicare Other

## 2017-08-12 DIAGNOSIS — Z95810 Presence of automatic (implantable) cardiac defibrillator: Secondary | ICD-10-CM | POA: Diagnosis not present

## 2017-08-12 DIAGNOSIS — I5022 Chronic systolic (congestive) heart failure: Secondary | ICD-10-CM

## 2017-08-12 NOTE — Telephone Encounter (Signed)
Spoke with pt and reminded pt of remote transmission that is due today. Pt verbalized understanding.   

## 2017-08-12 NOTE — Progress Notes (Signed)
EPIC Encounter for ICM Monitoring  Patient Name: Terry Peck is a 77 y.o. male Date: 08/12/2017 Primary Care Physican: Biagio Borg, MD Primary Cardiologist:Nahser Electrophysiologist: Allred Dry Weight:Previous weight 178 lbs Bi-V Pacing: >99%       Attempted call to patient and unable to reach.  Left detailed message regarding transmission.  Transmission reviewed.    Thoracic impedance normal.  Prescribed dosage: Furosemide 40 mg 1 tablet daily  Labs: 05/28/2017 Creatinine 0.82, BUN 18, Potassium 4.7, Sodium 141, EGFR 96.86 03/04/2017 Creatinine 0.91, BUN 14, Potassium 4.6, Sodium 144, EGFR 82-94 11/20/2016 Creatinine 0.84, BUN 19, Potassium 4.9, Sodium 141, EGFR 85-98 09/07/2016 Creatinine 0.84, BUN 19, Potassium 4.9, Sodium 141 08/16/2016 Creatinine 0.99, BUN 24, Potassium 4.7, Sodium 143 07/17/2016 Creatinine 0.86, BUN 54, Potassium 4.8, Sodium 141 02/09/2016 Creatinine 0.79, BUN 15, Potassium 5.1, Sodium 140 11/09/2015 Creatinine 0.82, BUN 18, Potassium 4.1, Sodium 138, EGFR 97.26  Recommendations: Left voice mail with ICM number and encouraged to call if experiencing any fluid symptoms.  Follow-up plan: ICM clinic phone appointment on 09/12/2017.   Copy of ICM check sent to Dr. Rayann Heman.   3 month ICM trend: 08/12/2017    Direct Trend Viewer 08/12/2017    1 Year ICM trend:       Rosalene Billings, RN 08/12/2017 2:53 PM

## 2017-08-13 ENCOUNTER — Telehealth: Payer: Self-pay

## 2017-08-13 NOTE — Telephone Encounter (Signed)
Remote ICM transmission received.  Attempted call to patient and left detailed message per DPR regarding transmission and next ICM scheduled for 09/12/2017.  Advised to return call for any fluid symptoms or questions.    

## 2017-08-19 ENCOUNTER — Ambulatory Visit: Payer: Medicare Other | Admitting: Pulmonary Disease

## 2017-08-19 ENCOUNTER — Encounter: Payer: Self-pay | Admitting: Pulmonary Disease

## 2017-08-19 VITALS — BP 128/72 | HR 74 | Temp 97.4°F | Ht 68.0 in | Wt 183.4 lb

## 2017-08-19 DIAGNOSIS — J84112 Idiopathic pulmonary fibrosis: Secondary | ICD-10-CM | POA: Diagnosis not present

## 2017-08-19 DIAGNOSIS — Z95810 Presence of automatic (implantable) cardiac defibrillator: Secondary | ICD-10-CM

## 2017-08-19 DIAGNOSIS — R911 Solitary pulmonary nodule: Secondary | ICD-10-CM | POA: Diagnosis not present

## 2017-08-19 DIAGNOSIS — E118 Type 2 diabetes mellitus with unspecified complications: Secondary | ICD-10-CM

## 2017-08-19 DIAGNOSIS — I5022 Chronic systolic (congestive) heart failure: Secondary | ICD-10-CM

## 2017-08-19 DIAGNOSIS — Z794 Long term (current) use of insulin: Secondary | ICD-10-CM

## 2017-08-19 DIAGNOSIS — J984 Other disorders of lung: Secondary | ICD-10-CM | POA: Diagnosis not present

## 2017-08-19 DIAGNOSIS — I447 Left bundle-branch block, unspecified: Secondary | ICD-10-CM

## 2017-08-19 DIAGNOSIS — R0902 Hypoxemia: Secondary | ICD-10-CM | POA: Diagnosis not present

## 2017-08-19 DIAGNOSIS — G4734 Idiopathic sleep related nonobstructive alveolar hypoventilation: Secondary | ICD-10-CM

## 2017-08-19 DIAGNOSIS — I110 Hypertensive heart disease with heart failure: Secondary | ICD-10-CM

## 2017-08-19 MED ORDER — FLUTICASONE PROPIONATE (INHAL) 250 MCG/BLIST IN AEPB: 1.0000 | INHALATION_SPRAY | Freq: Two times a day (BID) | RESPIRATORY_TRACT | 2 refills | Status: DC

## 2017-08-19 NOTE — Patient Instructions (Signed)
Today we updated your med list in our EPIC system...    Continue your current medications the same...    OK to finish out your current iron tabs, but now that you are repleted you do not need to continue...  We decided to add-in an inhaled corticosteroid - [id number] one inhalation twice daily as we discussed...    Be sure to Brush & rinse after the treatment in AM & PM...  Continue your Oxygen as you are doing...  Call for any questions or if we can be of service in any way...  Let's plan a follow up visit in 22mo, sooner if needed for problems.Marland KitchenMarland Kitchen

## 2017-08-19 NOTE — Progress Notes (Signed)
Subjective:     Patient ID: BOLIVAR KORANDA, male   DOB: 13-Dec-1940, 77 y.o.   MRN: [id number]  HPI    77 y/o WM, referred by Dr. Cathlean Cower for increased SOB>> He is a friend of my pt [name]...             MrThrasher was prev evaluated in 2013-14 by MR & notes summarized below>>  IOV 06/20/2011- Referred for lung nodule and dyspnea:    77year old male. reports that he quit smoking about 23 years ago. His smoking use included Cigarettes. He has a 5 pack-year smoking history (1/2 pack per day x 10 years). He has never used smokeless tobacco. Body mass index is 28.15 kg/(m^2). Known DM with nocturia - started on insulin 06/20/2011, HBP, Arthritis, Barretts- seeing Dr Ardis Hughs    Repoirts hx of small lung nodule with serial CT fu at [facility] for 2 years at [facility] ending 5 years ago. Stable and dc from followup. Does not remember which side    Reports insidious onset of DOE x 1year. Progressive gradually. Improved by rest. Notices when he mows yard, walking briskly one flight of stairs notices dyspnea. Always improved by rest. Wife notices he is not able to keep up with her during walks outside the home. No associated weight gain, or admissions to hospitals, no new medical problems diagnosed since onset of symptoms. Denies associated chest pain, cough, fever, weight gain, hemoptysis, edema, orthopnea, paroxysmal nocturnal dyspnea, wheezing. Systolic function was moderately to severely reduced.     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 Has active GERD. Denies collagen vascular disease. No steel dust, metal dust or asbestos exposure. Denies amio intake. Denies chemo. Does not have pet birds or feathery pillows     ECHO - 05/31/11: ejection fraction was in the range of 30% to 35%. There is hypokinesis of the inferior and inferoseptal myocardium. Doppler parameters are consistent with abnormal left ventricular relaxation (grade 1 diastolic  dysfunction). Ventricular septum: These changes are consistent with a left bundle branch block. Mitral valve: Mildly calcified annulus. Mildly thickened leaflets. Pulmonary arteries: Systolic pressure was mildly increased. PA peak pressure: 47m Hg.     CT CHEST 05/31/2011 Findings: No enlarged axillary or supraclavicular lymph nodes. There is no enlarged mediastinal or hilar lymph nodes. No pericardial or pleural effusion identified. There is a patulous and fluid-filled thoracic esophagus. There is peripheral and basilar predominant interstitial reticulation. Bibasilar subpleural honeycombing is also identified. Mild bronchiectasis is present in the lung bases. Ground-glass attenuation nodular density is noted within the left upper lobe measuring 1.8 cm, image 29. Solid nodule within left lower lobe measures 6 mm. Review of the visualized osseous structures is unremarkable. Limited imaging through the upper abdomen is significant for a left renal cyst. Only partially visualized and incompletely characterized without IV contrast. IMPRESSION: 1. 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 2. Ground-glass attenuating nodule within the left upper lobe is nonspecific and may be related to interstitial lung disease. Low grade pulmonary adenocarcinoma may have a similar appearance. Follow-up imaging in 3 months is recommended to ensure stability.      PFT 06/06/11:Signs of restrictions c/w the ILD we see on CT present even though TLC 5L/81% is normal. This is because FVC 2.9L/69% and reduced (fev1 is 2.45L/86% and normal). ratip is 84 and normal. DLCO is reduced 11/50%  #lung nodule  - please sign release  for [facility] to send CD rom of CT chest  - likely not cancer but if we cannot get [place] records we have to follow this iwht CT in 9 months  #Pulmonary fibrosis  - do not know why but acid reflux could have played a role  - please Serum: ESR, ANA, DS-DNA, RF,  anti-CCP, ssA, ssB, scl-70, ANCA, Total CK, Aldolase, Hypersensitivity Pneumonitis Panel, ACE level  - will call you with results to decide next step  #Shortness of breath  - both due to heart and lung issues  - between the two the heart is more important priority - please see cardiology ASAP  - our Nurse will ensure you have an appointment next few days    OV 07/19/2011:    Review dyspnea related to ILD NOS and new chronic systolic CHF. Since last visit has seen cardiology Dr Acie Fredrickson. States that with cards med mgmt: dyspnea improved a "little bit". Able to do more. Can now climb 2 flights of stairs non-stop (last visit only 1 flight of stairs). Saw Dr Acie Fredrickson yesterday 07/18/11: - NICM suspected- 07/11/11: Abnormal stress nuclear study: Dilated left ventricle with global hypokinesis. Suspect small mild area of reversible ischemia in mid anterior wall. The degree of severe LV dysfunction is out of proportion to the mild ischemia noted. LV Ejection Fraction: 29%. LV Wall Motion: Severe global hypokinesis. Meds adjusted. Cath on hold. Plan to place pacer if no improvement in EF with med mgmt. In temrs of workup for pulmonary fibrosis:     LABS 06/20/11 auto-immune test result: all negative including HP pa  nel except anca is trace positive at 1:20 which is the cut off between negative and positive. ESR 29 #lung nodule  - there are 2 spots in lung and there is possibiloty this could be lung cancer  - we absolultey need the scan fom [facility]; not just report but the CD rom of CT chest  - see if you can get it to [place] next 2 weeks if not repeat ct chest by Aug 28, 2011 (3rd months CT)  #Pulmonary fibrosis  - do not know why but acid reflux could have played a role  - at this point we will follow this - you need breathing test in 6 months or so .  - we will also use the CT chest results for the nodule to follow the pulmonary fibrosis  #Shortness of breath  - both due to heart and lung  issues  - between the two the heart is more important priority - Follow Dr Acie Fredrickson advise  - I will check with your cardiologist about suitability to attend [facility]    OV 09/10/2011    Followup pulmonary nodule, ILD non-UIP pattern NOS and dyspnea due to ILD and Systolic CHF: Last visit was 2 months ago. In terms of nodule, we have been unable to get the actual cd rom from [facility]. So, he will need fu testing here. In terms of pulm fibrosis, Bx Plans placed on hold due to cardiac status at time of last ov. I learn from 08/01/11 Dr Acie Fredrickson cards notes that plan is for continued medical mgmt. His dyspnea is improved a lot due to great med mgmt and bnp 80s as of 08/01/11. He is able to mow yard now. He thinks dietary portion control and resultant weight loss has also helped with dyspnea. #lung nodule  - there are 2 spots in lung and there is possibiloty this could be lung  cancer  - because we could not get scan from [facility], please do CT chest wihtout contrast now  #Pulmonary fibrosis  - do not know why but acid reflux could have played a role  - repeat anca blood test because this was trace positive and will help [place] know if this played a role  - depending on findings on CT chest, might have to consider lung biopsy  - you need breathing test in 6 months or so .  #Shortness of breath  - both due to heart and lung issues; glad you are better  - between the two- the heart is more important priority- Follow Dr Acie Fredrickson advise  - Dr Acie Fredrickson gave okay and will refer you to pulmonary rehab  #Overweight  - follow [facility] Diet sheet I discussed with you; stick to foods in left lane    Phne call 09/13/11:    ANCA blood test repeat result negative    CT scan: - lung nodule report - nodule smaller than before; & scarring or fibrosis in lung - stable as before.   OV 01/09/2012    Dyspnea: Did 12 visits with rehab that helped immensely with dyspnea. Then $ issues. Walking at home 50mles  non-stop at home with wife with o2 holding. PFT 01/09/12 - fvc 3.11/74%, fev1 2.6L/93% ratipo 84, TLC 73%, DLCO 11/53%. Of note, the FVC is significantly better than feb 2013 (? Weight loss effect) but dlco is same. In terms of weight: lost 9# on [facility] low glycemic diet (6# since [facility] 2013) . Tolerating diet well. No headaches. No hunger pangs. GERD resolved. Soft stools ++. Overall this makes him feel better despite fact echo 12/19/11 is unchangd with LOW Systolic EF 392%and elevated PASP 32m Hg -- EF conitnues to be low and he has been referred for pacemaker. #weight  - continue low glycemic diet  - as you continue to lose weight, talk to pcp JaCathlean CowerMD about slowly coming off diabetic drugs  #lung nodule  - next ct chest is May 2014  #Lung fibrosis and shortness of breath  - glad shortness of breath better  - contnue exercise and diet  - glad you had flu shot  - will hold off lung biopsy conversation due to risk of bx with EF 35%; atleast till EP eval complete or there is evidence of progression on PFT    OV 09/17/2012:      Had pacer October 2013: and since then dyspnea significantly better/ Also had ECHO 07/28/12: shows EF improved to 55% and shows some LVH. After above, not having dyspnea for yard work. Able to do all yard work without sitting. Before was sitting 2-3 times. Currently no dyspnea or cough. Feels good.    PFT 01/09/12 - fvc 3.11/74%, fev1 2.6L/93% ratipo 84, TLC 73%, DLCO 11/53%    PFT 09/16/2012. FVC 2.9 L/ 70%. FEV1 2.5 L/93%. Ratio is 86/118%. Total lung capacity is 4.4 L/70%. DLCO is reduced at 40.7/54%. Essentially shows restriction with low diffusion    EKG Jan 2014: Qtc > 500 msec    CT chest 08/26/2012: My impression is UIP> IMPRESSION: No change in subpleural reticular and cystic changes as above, which may be seen with usual interstitial pneumonitis, idiopathic pulmonary fibrosis, or hypersensitivity pneumonitis.  Stable 6 mm left lower lobe pulmonary  nodule, no persistent nodularity to the previously seen subpleural area of left upper lobe ground-glass airspace opacity.     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 Age > 7021  with subpleural, bibasal densities and in my opinion basal honey combing as well. Autoimmune is negative. Dyspnea resolved/improved significantly after pacer. I explained the disease and diagnosis to him. Explained, that high odds of IPF and diseaes is progressive, and ultimatly fatal and could be debilitating. Recommended that we screen him for pirfenidone via [employer] He is in agreement. He seems to meet criteria for the EAP program except Jan 2014 EKG showed long Qtc. Patient is interested.    ==>Ultimately he was not a candidate for Pirfenidone due to his cardiac problems.        ~  November 30, 2014:  Pulmonary consult w/ SN>        MrThrasher was recently [facility] 7/27- 11/18/14 by [facility] due to severe anemia; he states he developed recurrent SOB in mid-July and eval by PCP showed Hg=6; prior to this he was mowing his yard & stable; he denied any bleeding or change in stools; hosp eval revealed low MCV, Fe=10 (2%sat), Ferritin=4 (this suggests long slow ooze), B12=174, Folate=32; stool check in hosp was said to be NEG for blood...  He was supposed to be on Iron supplement but had stopped this about 33yrbefore;  He is followed for GI by DrJacobs- had EGD 09/06/14 showing 3-4cmHH & Barrett's epithelium (Bx= no dysplasia or malignancy); he has been taking Prilosec20Bid regularly;  Last colonoscopy was 07/18/11 showing 2 sessile polyps- one hyperplastic, one tub adenoma & f/u planned 59yr.. He was disch on FeSO4 Tid => Breathing is OK at rest, DOE w/ walking/ mowing=> now improved since transfusion; he denies cough, sputum/ hemoptysis; he denies CP/ palpit/ edema;  Also needs oral B12 supplement daily w/ f/u B12 level per protocol...       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      Current Meds> ASA81, Coreg25Bid, Lasix40, Atorva40, Metform850Tid, Actos45, Lantus44, Omep20Bid... EXAM reveals Afeb, VSS, O2sat=93% on RA at rest;  HEENT- neg, mallampati2;  Chest- bibasilar rales ~1/2 way up, no wheezing or rhonchi;  Heart- RR gr 1/6 SEM w/o r/g;  Abd soft, neg;  Ext- neg w/o c/c/e...  2DEcho 10/2014>  modLVH w/ incr wall thickness, EF=50-55% & no regional wall motion abn, AoV mod calcif & thickened w. Mild AS, MV is wnl; RV function normal, PAsys~3439m...  CXR 11/17/14 showed norm heart size, AICD on left, bibasilar fibrosis vs atx sl incr from prev...  Spirometry 11/30/14 showed FVC=2.24 (54%), FEV1=1.97 (62%), %1sec=88, mid-flows are wnl at 125% predicted... (Vol & flow are both reduced from prev).  Ambulatory oxygen saturation test 11/30/14> O2sat=94% on RA at rest w/ pulse=60/min; he ambulated 2 laps & stopped w/ dyspnea; lowest O2sat=87% w/ pulse of 80/min...   CT Chest 12/01/14 showed LUL ground-glass nodule is less prominent, LLL 6mm64mdule is unchanged, chronic ILD which appears slightly progressed in the periphery of both lungs; heart size is normal, extensive coronary artery calcification & AICD in place...  LABS 11/30/14 showed Hg=9.9 I [facility], and Labs> and discussed w/ pt & wife face-to-face during our 60mi79mpt... IMP/PLAN>>  Javin's recent incr in SOB was clearly due to his severe anemia which in turn was likely related  to a slow ooze from his HH/ Barrett's mucosa and the fact that he stopped his oral iron therapy ~74yrago;  He is feeling sl better after the 2uTx & oral iron Rx w/ Hg up tp ~10 now;  He is asked to stay on the Fe Bid & B12 10028m/d... Having noted this & the recent  Cards f/u indicating stable status- his 2y55yru CT Chest and spirometry does indicate some progression of his ILD... I am in favor of considering some treatment for his pulm fibrosis- he was deemed not a candidate for pirfenedone due to his cardiac problems & AICD;  I outlined 3 options to the pt & his wife>  1) Conservative approach- continue observation to see how his dypnea improves w/ Fe & an exercise program;  2) Middle-of-the-road approach- perhaps considering an ICS like Pulmicort;  3) Aggressive approach- revisit eligibility for Pirfenedone vs trial of oral Pred... They clearly lean in the conservative direction & DavTorezes not want additional meds at this time; he wants to wait until his Hg has ret to normal & to see how his exercise program progresses... We plan ROV in 3 mo w/ CXR.  ~  March 02, 2015:  25mo97mo w/ f/u CXR> as outlined above DaviErvey been taking the FeSO4 Tid and B12 supplement regularly; he says he feels considerably better & claims his breathing is improved, overall doing well- notes DOE w/ exertion (eg- digging a bif hole to transplant schrubs), states he mows ok, walks ok, stairs ok, etc; he has O2 concentrator at home & hasn't needed to use it he says- it's a $23 copay per month & he wants to get rid of it if poss;(we discussed checking an ONO first)... He saw CARDS- DrNahser 02/17/15 for f/u HBP, SysCHF w/ EF=30-35%, LBBB, Bi-V ICD in place, HL, DM> Myoview had EF=29%, global LV dil, no ischemia; meds adjusted- on Coreg25Bid, Lasix40, ASA81; f/u LVF normalized on the Bi-V pacer=> this is checked by DrAllred for EP...     EXAM reveals Afeb, VSS, O2sat=94% on RA at rest;  HEENT- neg, mallampati2;  Chest- bibasilar rales ~1/2 way up, no wheezing or rhonchi;  Heart- RR gr 1/6 SEM w/o r/g;  Abd soft, neg;  Ext- neg w/o c/c/e...  CXR 03/02/15>  Mild cardiomeg, pacer stable, prominent interstitial markings/fibrosis- similar to prior, no change...  LABS 03/02/15>  CBC- improved w/  Hg=15, Fe=262 (62%sat), Ferritin=16;  B12 >1500... REC to decr Fe to one daily & decr B12 to 1/2 tab daily...  IMP/PLAN>>  DaviDonelleimproved, asked to decr the Fe & B12 as above; we will check ONO to assess need for continued Home oxygen; ROV planned 97mo.39mo ~  October 18, 2015:  7-83mo R81mo I note that Jerimiah Aldin had the ONO done as requested after his last visit; His PCP- DrJohn had [facility] pick it up & pt feels that he is doing satis w/o the oxygen- denies cough, sput, SOB, CP, edema, etc; he notes no deterioration in his breathing- DOE w/ exertion as before; NOTE> he has a pulse-ox monitor & says that his O2 drops w/ exercise, getting as low as 85% on RA w/ mod exertion, "but it returns to normal quickly after rest;  He says 94-95% sat at rest on RA, and 88-89% w/ mowing/ walking;  He again denies CP/ palpit/ dizzy, edema & followed by DrNahser & DrAllred as noted (they have asked him to lim his sodium intake)...Marland KitchenMarland Kitchen  EXAM reveals Afeb, VSS, O2sat=94% on RA at rest;  HEENT- neg, mallampati2;  Chest- bibasilar rales ~1/2 way up, no wheezing or rhonchi;  Heart- RR gr 1/6 SEM w/o r/g;  Abd soft, neg;  Ext- neg w/o c/c/e...  Ambulatory Oximetry 10/18/15>  O2sat=94% on RA at rest w/ pulse=66/min;  He ambulated 2 laps in office (185' ea) w/ lowest O2sat=86% w/ pulse=83/min...   Overnight Oximetry> ordered again, we were never notified of the results, finally contacted [facility] & told he desaturated to <88% on RA for 183mn of the overnight study=> restart O2 at 2L/min Qhs... IMP/PLAN>>  Pt is resistent to the idea of home oxygen but his studies indicate need for NOCTURNAL O2 at 2L/min AND ambulatory O2 w/ exertion; he will capitulate to the former but declines O2 w/ exercise believing that he is stable & doing satis w/ quick recovery after exercise...   ~  February 16, 2016:  41moOV & pulmonary follow up visit> DaGaeleturns and reports feeling well- no new complaints or concerns; he is using his O2 at 2L/min  flow Qhs, but doesn't think he needs it during the day w/ usual activities; he says that his breathing is good, denies cough/ sput/ hemoptysis/ ch in DOE/ CP/ palpit/ dizzy/ ede4m77m.     He saw PCP-DrJohn 11/09/15> doing satis, Labs were ok w/ BS=133, but A1c=8.6 and his Lantus was increased; on Lantus40, Metform850Tid, Actos45    He had [facility] f/u DrNahser 02/13/16> HBP, LBBB, SysCHF w/ EF=30-35% that normalized w/ Bi-V pacer, HL-- last 2DEcho 7/16 showed EF=50-55%; Pt stopped his Lip40 believing that this caused his Glucose to go up; he has since restarted Atorva20 & will check w/ DrJohn for f/u FLP...     He saw EP clinic, DrAllred & NP 02/02/16> reported doing well, norm ICD function confirmed,  EXAM reveals Afeb, VSS, O2sat=95% on RA at rest;  HEENT- neg, mallampati2;  Chest- bibasilar rales ~1/2 way up, no wheezing or rhonchi;  Heart- RR gr 1/6 SEM w/o r/g;  Abd soft, neg;  Ext- neg w/o c/c/e...  CXR 02/16/16>  Norm heart size, ICD well positioned on the left, bilat interstitial thickening is stable, unchanged, NAD... IMP/PLAN>>  DavDavian stoic but stable, reports breathing satis w/o recent exac etc..recommended to continue same, stay active, rov in 82mo73moner if needed prn...  ~  August 16, 2016:  82mo 39mo& DavidShamarrrns w/o complaints, feeling well, & denies any problems currently;  He says his breathing is good & he denies cough, sput, hemoptysis or SOB- he does admit to DOE "with heavy exertion" (he push mows the yard & stops 1/2 way thru to rest, then proceeds);  He does have some NOCTURNAL & exercise induced hypoxemia- he wears O2 at 2L/min Qhs but says he doesn't need it during the day... we reviewed the following medical problems during today's office visit >>     Hx dyspnea, ILD- pulm fibrosis, restrictive lung physiology>  [facility] pt decided against pirfenidone & he didn't qualify for the study due to his CHF; he was followed by his PCP until referral  11/2014=> see above- he chose conservative approach & did not want additional meds; his ILD has been slowly progressive but he has remained stoic & denies symptoms of cough or dyspnea, but breathing did improve w/ treatment of his anemia (Fe-Tid & oral B12 supplement), and w/ Pulm Rehab x several months in 2017 but too $$ he said...Marland KitchenMarland Kitchen  Nocturnal hypoxemia and exercise induced hypoxemia> as noted he uses nocturnal O2 at 2L/min, but only uses exercise O2 when he feels he needs it & he checks w/ home oximeter; Note: PAsys ~34 on last 2DEcho (7/16).    Hx small lung nodules on CT Chest> this was found & followed at the [place]; he has had several CT Chest scans here- last 11/2014 & summary shows sl progressive peripheral interstitial dis over time, underlying paraseptal emphysema & bilat LL bronchiectasis, no adenopathy, stable 51m LLL nodule & an area in LUL w/ GGO that appears less prominent serially; incidental findings included atherosclerotic ao, coronary calcif, gallstones, renal cyst...    Ex-smoker> min smoking hx, only smoked for 10 yrs, and quit 1990.    HBP>  Controlled on meds- ASA81, Coreg25Bid & Lasix40/d;  BP= 120/80 and he denies CP, palpit, SOB, dizzy, or edema...    Cardiomyopathy, chr systolic CHF, LBBB, BiV pacer> followed by DrNahser & DrAllred, SysCHF w/ EF=30-35% that normalized w/ Bi-V pacer- last 2DEcho 7/16 showed EF=50-55%...    IDDM> [facility] now followed by PCP-DrJJohn on Lantus40u, Metform850Tid, Actos45; LABS 10/2015 showed FBS=133 & A1c=8.6;  f/u LABS today showed BS=137, A1c=6.7 => he is rec to STOP the Actos & continue the Lantus40 & Metform Tid + low carb diet...     HL> on Atorva20 & Omega-3 fish oil; last FLP 06/2016 showed TChol 104, TG 100, HDL 42, LDL 42-- rec to continue same meds + diet....    HH/ Barrett's esoph, colon polyp> followed for GI by DrJacobs- had EGD 09/06/14 showing 3-4cmHH & Barrett's epithelium (Bx= no dysplasia or malignancy)- he has been  taking Prilosec20Bid regularly; Last colonoscopy was 07/18/11 showing 2 sessile polyps- one hyperplastic, one tub adenoma & f/u planned 537yr..    DJD> aware, on Advil, Robaxin, Tylenol#3 as needed...    [facility]/ [facility] & Hg=6, iron deficient (Ferritin=4) & low B12 (174)-- presumed slow ooze from his large HH, no direct source found;  Hg & Fe ret to normal on oral supplement, same for B12;  Currently taking FeSO4- 32534m, and B12- 1000m61m- continue same. EXAM reveals Afeb, VSS, Wt down 7# to 183#; O2sat=93% on RA at rest;  HEENT- neg, mallampati2;  Chest- bibasilar rales ~1/2 way up, no wheezing or rhonchi;  Heart- RR gr 1/6 SEM w/o r/g;  Abd soft, neg;  Ext- neg w/o c/c/e...  Ambulatory Oximetry 08/16/16>  O2sat=90% on RA at rest w/ pulse=75/min;  He ambulated 2 Laps in the office (185'ea) & lowest O2sat=83% w/ pulse=92/min;  He is rec to use O2 w/ all activities and ambulation...   LABS 08/16/16>  Chems- ok w BS=137, A1c=6.7, Cr=0.99, BNP=39;  CBC- wnl w/ Hg=15.5, mcv=95;  Fe=87 (19%sat), Ferritin=18.7 (22-322)... IMP/PLAN>>  DaviZymerey73 & has slowly progressive IPF along w/ NICM, IDDM, and hx IDA;  He has twice declined consideration of anti-fibrotic therapy, and he is quite stoic- continuing to deny syptoms in the face of slowly progressive fibrosis on CXR/scans, and hypoxemia w/ exercise;  He is rec to use the O2 w/ all activities and qhs (ok to take it off at rest & he monitors O2sats w/ his home monitor (all>90%);  We will proceed w/ a f/u 2DEcho to recheck EF & PAsys;  DM is improved w/ A1c=6.7 & he is rec to STOP the Actos in light of his cardiac dis;  Finally his Hg is 15 on FeSO4 one daily- rec  to continue w/ his Ferritin level...  ADDENDUM>>  2DEcho 09/07/16>>  Severe focal basilar hypertrophy & mild concentric hypertrophy, norm LVF w/ EF=60-65% w/ norm wall motion; Gr1DD; AoV mildly calcif & no AS, no AI; MV- norm; Atria/ RV/ PA- all wnl...  ~  February 18, 2017:  29mo ROV & DJoseantonioreports feeling great, denies problems and has no new complaints or concerns;  He is wearing his O2 at 2L/min Qhs and prn days (eg- with exertion);  He says he is able to get a good deep breath & "I don't ever feel out of breath";  He denies cough, sput, hemoptysis; there is no chest congestion, no CP/ palpit/ edema, and his DOE is stable/ unchanged... We reviewed the following interval Epic notes since last visit>      He saw CARDS- DrNahser on 08/17/16>  Hx HBP, LBBB, NICM w/ chr sys CHF w/ EF 30-35%=> improved to 55%, Bi-V ICD placed, HL;  On ASA81, Coreg25Bid, Lasix40, Lipitor20;  Doing well w/o symptoms, no edema;  They added Losartan50 & rec that he stop his Actos...     He had f/u w/ PCP- DrJohn on 11/23/16>  Wellness visit, doing satis, note reviewed, they decided to decr his Actos to 380md (I had prev directed the pt to STOP the Actos 07/2016).    He continues to have his ICD monitored remotely every month per DrAllred>  Most recent report 01/29/17- pacing >99%, thoracic impedance is normal, no changes made...  We reviewed the following medical problems during today's office visit>     Hx dyspnea, ILD- pulm fibrosis, restrictive lung physiology>  [facility] pt decided against pir[facility]idone & he didn't qualify for the study due to his CHF; he was followed by his PCP until referral 11/2014=> see above- he chose conservative approach & did not want additional meds; his ILD has been slowly progressive but he has remained stoic & denies symptoms of cough or dyspnea, but breathing did improve w/ treatment of his anemia (Fe-Tid & oral B12 supplement), and w/ Pulm Rehab x several months in 2017 but too $$ he said; he says he is walking some but needs to do more.    Nocturnal hypoxemia and exercise induced hypoxemia> as noted he uses nocturnal O2 at 2L/min, but only uses exercise O2 when he feels he needs it & he checks w/ home oximeter; Note: PAsys ~34 on last 2DEcho  (7/16).    Hx small lung nodules on CT Chest> [place] last 11/2014 & summary shows sl progressive peripheral interstitial dis over time, underlying paraseptal emphysema & bilat LL bronchiectasis, no adenopathy, stable 92m14mLL nodule & an area in LUL w/ GGO that appears less prominent serially; incidental findings included atherosclerotic ao, coronary calcif, gallstones, renal cyst...    Ex-smoker> min smoking hx, only smoked for 10 yrs, and quit 1990.    HBP>  Controlled on meds- ASA81, Coreg25Bid, Losartan50, & Lasix40/d;  BP= 142/76 and he denies CP, palpit, SOB, dizzy, or edema...    Cardiomyopathy, chr systolic CHF, LBBB, BiV pacer> followed by DrNahser & DrAllred, SysCHF w/ EF=30-35% that normalized w/ Bi-V pacer- last 2DEcho 7/16 showed EF=50-55%; monthly thoracic impedance checks OK.    IDDM> [facility] now followed by PCP-DrJJohn on Lantus40u, Metform850Tid, Actos30; LABS 07/2016 showed BS=137, A1c=6.7 => I directed him to stop the Actos 4/18  but DrJohn preferred to decr to '30mg'$ .    HL> on Atorva20 & Omega-3 fish oil; last FLP 06/2016 showed TChol 104, TG 100, HDL 42, LDL 42-- rec to continue same meds + diet....    HH/ Barrett's esoph, colon polyp> followed for GI by DrJacobs- had EGD 09/06/14 showing 3-4cmHH & Barrett's epithelium (Bx= no dysplasia or malignancy)- he has been taking Prilosec20Bid regularly; Last colonoscopy was 07/18/11 showing 2 sessile polyps- one hyperplastic, one tub adenoma & f/u planned 101yr...    DJD> aware, on Advil, Robaxin, Tylenol#3 as needed...    [facility] & Hg=6, iron deficient (Ferritin=4) & low B12 (174)-- presumed slow ooze from his large HH, no direct source found;  Hg & Fe ret to normal on oral supplement, same for B12;  Currently taking FeSO4- '325mg'$ /d, and B12- 10019m/d- continue same. EXAM reveals Afeb, VSS, Wt 191#; O2sat=85% on RA on  arrival, 92% on 2L/min;  HEENT- neg, mallampati2;  Chest- bibasilar rales ~1/2 way up, no wheezing or rhonchi;  Heart- RR gr 1/6 SEM w/o r/g;  Abd soft, neg;  Ext- neg w/o c/c/e...  CXR 02/18/17 (independently reviewed by me in the PACS system) shows stable heart size & vascularity, left sided AICD, similar bilat reticulonodular interstitial opacities- stable, NAD...  IMP/PLAN>>  DaPatrichs stable w/ his heart & lung problems- Again rec to increase his exercise program & use his O2 2L/min Qhs and w/ activity   ~  August 19, 2017:  35m79moV & Khiem returns c/o some incr SOB w/ activity; he has been using his Home O2 at 2-3L/min regularly (with activity during the day & Qhs);  He denies much cough, denies sputum/ hemoptysis, and the sl progressive DOE- says he's ok sitting at rest w/ O2sats in the 90's but drops w/ any activity now;  He denies CP/ palpit/ edema... He requests Handicap sticker & this was provided today... We reviewed the following interval Epic notes since last visit>     He saw CARDS- DrNahser on 03/04/17>  HBP, CHF (EF=30-35%=>55%), LBBB, Bi-V pacer & ICD, HL; note reviewed, EF has normalized to 60-65% w/ G1DD, no changes made to his med regimen...     He saw PCP- DrJohn on 05/28/17>  Wellness visit & f/u, note reviewed, overall doing satis, Lantus was decr to 35u daily, follow up planned in 35mo37mo    DaviTanushtinues to have his ICM monitored Qmo> Last 08/12/17 & note reviewed, recent Cr=0.82, stable impedance monitoring, no changes made... We reviewed the following medical problems during today's office visit>     Hx dyspnea, Abn CT Chest w/ ILD-pulm fibrosis/ paraseptal emphysema/ bilat LL bronchiectasis, and scat pulm nodules, restrictive physiology, hypoxemia on home O2> on Home O2 at 2-3L/min, not currently on inhalers, he reports slowly progressing dyspnea & has been using his O2 more regularly. Yet denies cough/ sputum/ edema, notes DOE w/ walking & chores but ADL's are still ok...      CARDIAC>  HBP, cardiomyopathy/ chronic sys CHF, LBBB, Bi-V pacer/ ICD> followed by DrNahser & felt to be stable on ASA81, Coreg25Bid, Losar50, Lasix40; ICM monitoring monthly has been stable...    Medical>  HBP, IDDM, HL, HH/ Barrett's/ colon polyps, DJD, hx severe anemia>  folloowed by DrJohn, on above + Atorva20, Metform850Tid, Actos30, Lantus35, Prilosec20, Fe/ B12/ VitD... EXAM reveals Afeb, VSS, Wt down 8# to 183#; O2sat=94% on 3L/min;  HEENT- neg, mallampati2;  Chest- bibasilar rales ~1/2 way up, no wheezing  or rhonchi;  Heart- RR gr 1/6 SEM w/o r/g;  Abd obese, soft, neg;  Ext- neg w/o c/c/e...  LABS in Epic 05/2017>  FLP- all parameters at goals;  Chems- ok w/ K=4.7, BS=79, Cr=0.82, LFTs wnl;  CBC- ok w/ Hg=15.7, WBC=7.4;  A1c=6.7;  TSH=2.93;  PSA=0.29 IMP/PLAN>>  Lashan is now 89 w/ pulm fibrosis that is stable roentgenographically but slowly progressive physiologically w/ hypoxemia on Home O2; he has so-far been able to maintain his life-style and activity level/ exercise;  Cardiology stable per DrNahser and Medically managed by DrJohn... Continue current med & we plan ROV in 2mo..    Past Medical History:  Diagnosis Date  . Abnormal ANCA test 07/03/2011  . Allergy   . Arthritis    "mild; right shoulder" (01/29/2012)  . B12 deficiency anemia   . Barrett's esophagus 11/23/2010  . Bundle branch block, left   . Cataract    L eye surgery  . CH[place]ive heart [place]) [place]    pacemaker  . Chronic systolic dysfunction of left ventricle    EF 30%  . Colon polyps 11/23/2010  . GERD (gastroesophageal reflux disease)   . Hyperlipidemia 10/14/2012  . Hypertension   . ICD (implantable cardiac defibrillator) in place   . Iron deficiency anemia 05/30/2011  . Liver abscess 1980's  . Nonischemic cardiomyopathy ([place]    s/p St[employer] Jude BiV ICD 01/29/12  . Pulmonary fibrosis ([place]   . Pulmonary nodules   . Shortness of breath    "related to heart being out of rhythm" (01/29/2012)  . Type II  diabetes mellitus ([place]     Past Surgical History:  Procedure Laterality Date  . abscess drained  ?1980's   liver  . BI-VENTRICULAR IMPLANTABLE CARDIOVERTER DEFIBRILLATOR N/A 01/29/2012   Procedure: BI-VENTRICULAR IMPLANTABLE CARDIOVERTER DEFIBRILLATOR  (CRT-D);  Surgeon: JThompson Grayer MD;  [facility];  Service: Cardiovascular;  Laterality: N/A;  . CARDIAC DEFIBRILLATOR PLACEMENT  01/29/2012   SJM [employer] BiV ICD implanted by Dr ARayann Heman . CATARACT EXTRACTION Left   . COLONOSCOPY    . TONSILLECTOMY  1952  . UPPER GI ENDOSCOPY      Outpatient Encounter Medications as of 08/19/2017  Medication Sig  . acetaminophen-codeine (TYLENOL #3) 300-30 MG tablet Take 1 tablet by mouth every 8 (eight) hours as needed for moderate pain.  .Marland Kitchenaspirin EC 81 MG tablet Take 81 mg by mouth daily.  .Marland Kitchenatorvastatin (LIPITOR) 20 MG tablet TAKE ONE TABLET BY MOUTH  DAILY  . Calcium-Magnesium-Zinc 1000-400-15 MG TABS Take 1 tablet by mouth daily.   . carvedilol (COREG) 25 MG tablet TAKE ONE TABLET BY MOUTH TWICE DAILY WITH MEALS  . Cholecalciferol (VITAMIN D) 1000 UNITS capsule Take 1,000 Units by mouth daily.   . Cyanocobalamin (B-12 PO) Take 1 tablet by mouth daily.   . furosemide (LASIX) 40 MG tablet Take 1 tablet (40 mg total) by mouth daily.  .Marland Kitchenibuprofen (ADVIL,MOTRIN) 200 MG tablet Take 200-300 mg by mouth 2 (two) times daily as needed. For pain  . LANTUS SOLOSTAR 100 UNIT/ML Solostar Pen INJECT 50 UNITS SUBCUTANEOUSLY ONCE DAILY  . Lutein 20 MG CAPS Take 20 mg by mouth daily.  . metFORMIN (GLUCOPHAGE) 850 MG tablet TAKE 1 TABLET BY MOUTH THREE TIMES DAILY  . methocarbamol (ROBAXIN) 500 MG tablet TAKE ONE TABLET BY MOUTH ONCE DAILY AS NEEDED  . Multiple Vitamin (MULTIVITAMIN WITH MINERALS) TABS Take 1 tablet by mouth daily.  . OMEGA 3 1000 MG CAPS  Take 1,000 mg by mouth 2 (two) times daily.   Marland Kitchen omeprazole (PRILOSEC) 20 MG capsule TAKE 1 CAPSULE BY MOUTH TWICE DAILY .  Marland Kitchen pioglitazone (ACTOS)  30 MG tablet Take 1 tablet (30 mg total) by mouth daily.  Marland Kitchen RELION PEN NEEDLES 32G X 4 MM MISC USE ONCE DAILY TO ADMINISTER INSULIN  . [DISCONTINUED] ferrous sulfate 325 (65 FE) MG EC tablet Take 325 mg by mouth daily with breakfast.  . Fluticasone Propionate, Inhal, (FLOVENT DISKUS) 250 MCG/BLIST AEPB Inhale 1 puff into the lungs 2 (two) times daily.  Marland Kitchen losartan (COZAAR) 50 MG tablet Take 1 tablet (50 mg total) by mouth daily.  . [DISCONTINUED] simvastatin (ZOCOR) 80 MG tablet Take 1 tablet (80 mg total) by mouth at bedtime.   No facility-administered encounter medications on file as of 08/19/2017.     Allergies  Allergen Reactions  . Penicillins Anaphylaxis and Other (See Comments)    "swelling of eyes; throat; could breath good" (01/29/2012)  . Iodine Other (See Comments)    "if I get iodine dye, I'll get a fever" (01/29/2012)  . Fish Oil Itching    Pt said only when given through IV    Immunization History  Administered Date(s) Administered  . Influenza Split 01/03/2012  . Influenza Whole 05/03/2011  . Influenza, High Dose Seasonal PF 01/17/2016  . Influenza,inj,Quad PF,6+ Mos 03/24/2013, 12/09/2013, 03/02/2015  . Influenza-Unspecified 02/04/2017  . Pneumococcal Conjugate-13 10/21/2008, 04/14/2013, 02/04/2017  . Pneumococcal Polysaccharide-23 01/03/2012  . Tdap 11/23/2010  . Zoster 04/14/2013    Current Medications, Allergies, Past Medical History, Past Surgical History, Family History, and Social History were reviewed in [employer] record.   Review of Systems            All symptoms NEG except where BOLDED >>  Constitutional:  F/C/S, fatigue, anorexia, unexpected weight change. HEENT:  HA, visual changes, hearing loss, earache, nasal symptoms, sore throat, mouth sores, hoarseness. Resp:  cough, sputum, hemoptysis; SOB, tightness, wheezing. Cardio:  CP, palpit, DOE, orthopnea, edema. GI:  N/V/D/C, blood in stool; reflux, abd pain, distention,  gas. GU:  dysuria, freq, urgency, hematuria, flank pain, voiding difficulty. MS:  joint pain, swelling, tenderness, decr ROM; neck pain, back pain, etc. Neuro:  HA, tremors, seizures, dizziness, syncope, weakness, numbness, gait abn. Skin:  suspicious lesions or skin rash. Heme:  adenopathy, bruising, bleeding. Psyche:  confusion, agitation, sleep disturbance, hallucinations, anxiety, depression suicidal.   Objective:   Physical Exam      Vital Signs:  Reviewed...   General:  WD, WN, 77 y/o WM in NAD; alert & oriented; pleasant & cooperative... HEENT:  [place]/AT; Conjunctiva- pink, Sclera- nonicteric, EOM-wnl, PERRLA, EACs-clear, TMs-wnl; NOSE-clear; THROAT-clear & wnl.  Neck:  Supple w/ fair ROM; no JVD; normal carotid impulses w/o bruits; no thyromegaly or nodules palpated; no lymphadenopathy.  Chest:  decr BS bilat w/ velcro rales bilat ~1/2 way up; no wheezing or rhonchi, no signs of consolidation... Heart:  AICD on left, Regular Rhythm; gr 1/6 SEM w/o rubs or gallops detected. Abdomen:  Soft & nontender- no guarding or rebound; normal bowel sounds; no organomegaly or masses palpated Ext:  decrROM; without deformities +arthritic changes; no varicose veins, venous insuffic, or edema;  Pulses intact w/o bruits. Neuro:  No focal neuro deficits; sensory testing normal; gait normal & balance OK. Derm:  No lesions noted; no rash etc. Lymph:  No cervical, supraclavicular, axillary, or inguinal adenopathy palpated.   Assessment:      IMP >>  Multifactorial dyspnea -- fibrosis/ restriction, CHF, anemia, deconditioning all playing a roll     Pulmonary fibrosis -- very slowly progressive per prev scans, pt desires conservative approach & does not want additional meds.    Pulmonary nodules -- stable on serial CT Chest scans    NICM w/ chr sys CHF, LBBB -- per DrNasher/ Allred, stable    AICD placed -- stable    Anemia- Iron defic & low B12 -- likely slow ooze from HH/ Barrett's & absorption  issues -- improved w/ FeSO4 and B12 orally- stay on PPI Bid as well...    Other medical problems as noted> HL, IDDM (watch Actos rx w/ CHF diagnosis)  PLAN >>  11/30/14>   Arvid's recent incr in SOB was clearly due to his severe anemia which in turn was likely related to a slow ooze from his HH/ Barrett's mucosa and the fact that he stopped his oral iron therapy ~31yrago;  He is feeling sl better after the 2uTx & oral iron Rx w/ Hg up tp ~10 now;  He is asked to stay on the Fe Bid & B12 10070m/d... Having noted this & the recent Cards f/u indicating stable status- his 2y51yru CT Chest and spirometry does indicate some progression of his ILD... 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 They clearly lean in the conservative direction & DavDylannes not want additional meds at this time; he wants to wait until his Hg has ret to normal & to see how his exercise program progresses... We plan ROV in 3 mo w/ CXR. 03/02/15>   DavMatther improved, asked to decr the Fe & B12 as above; we will check ONO to assess need for continued Home oxygen; ROV planned 60mo60mo 10/18/15>   Pt is resistent to the idea of home oxygen but his studies indicate need for NOCTURNAL O2 at 2L/min AND ambulatory O2 w/ exertion; he will capitulate to the former but declines O2 w/ exercise believing that he is stable & doing satis w/ quick recovery after exercise...  02/16/16>   Pt reports stable, no new complaints or concerns... 08/16/16>   DaviShanon Browy22 & has slowly progressive IPF along w/ NICM, IDDM, and hx IDA;  He has twice declined  consideration of anti-fibrotic therapy, and he is quite stoic- continuing to deny syptoms in the face of slowly progressive fibrosis on CXR/scans, and hypoxemia w/ exercise;  He is rec to use the O2 w/ all activities and qhs (ok to take it off at rest 7 he monitors O2sats w/ his home monitor (all>90%);  We will proceed w/ a f/u 2DEcho to recheck EF & PAsys;  DM is improved w/ A1c=6.7 & he is rec to STOP the Actos in light of his cardiac dis;  Finally his Hg is 15 on FeSO4 one daily- rec to continue w/ his Ferritin level. 10.29/18>   DaviJahonstable w/ his heart & lung problems- Again rec to increase his exercise program & use his O2 2L/min Qhs and w/ activity 08/19/17>    DaviEndynow  77 w/ pulm fibrosis that is stable roentgenographically but slowly progressive physiologically w/ hypoxemia on Home O2; he has so-far been able to maintain his life-style and activity level/ exercise;  Cardiology stable per DrNahser and Medically managed by DrJohn... Continue current med & we plan ROV in 46mo.   Plan:     Patient's Medications  New Prescriptions   FLUTICASONE PROPIONATE, INHAL, (FLOVENT DISKUS) 250 MCG/BLIST AEPB    Inhale 1 puff into the lungs 2 (two) times daily.  Previous Medications   ACETAMINOPHEN-CODEINE (TYLENOL #3) 300-30 MG TABLET    Take 1 tablet by mouth every 8 (eight) hours as needed for moderate pain.   ASPIRIN EC 81 MG TABLET    Take 81 mg by mouth daily.   ATORVASTATIN (LIPITOR) 20 MG TABLET    TAKE ONE TABLET BY MOUTH  DAILY   CALCIUM-MAGNESIUM-ZINC 1000-400-15 MG TABS    Take 1 tablet by mouth daily.    CARVEDILOL (COREG) 25 MG TABLET    TAKE ONE TABLET BY MOUTH TWICE DAILY WITH MEALS   CHOLECALCIFEROL (VITAMIN D) 1000 UNITS CAPSULE    Take 1,000 Units by mouth daily.    CYANOCOBALAMIN (B-12 PO)    Take 1 tablet by mouth daily.    FUROSEMIDE (LASIX) 40 MG TABLET    Take 1 tablet (40 mg total) by mouth daily.   IBUPROFEN (ADVIL,MOTRIN) 200 MG TABLET    Take 200-300 mg by mouth 2 (two)  times daily as needed. For pain   LANTUS SOLOSTAR 100 UNIT/ML SOLOSTAR PEN    INJECT 50 UNITS SUBCUTANEOUSLY ONCE DAILY   LOSARTAN (COZAAR) 50 MG TABLET    Take 1 tablet (50 mg total) by mouth daily.   LUTEIN 20 MG CAPS    Take 20 mg by mouth daily.   METFORMIN (GLUCOPHAGE) 850 MG TABLET    TAKE 1 TABLET BY MOUTH THREE TIMES DAILY   METHOCARBAMOL (ROBAXIN) 500 MG TABLET    TAKE ONE TABLET BY MOUTH ONCE DAILY AS NEEDED   MULTIPLE VITAMIN (MULTIVITAMIN WITH MINERALS) TABS    Take 1 tablet by mouth daily.   OMEGA 3 1000 MG CAPS    Take 1,000 mg by mouth 2 (two) times daily.    OMEPRAZOLE (PRILOSEC) 20 MG CAPSULE    TAKE 1 CAPSULE BY MOUTH TWICE DAILY .   PIOGLITAZONE (ACTOS) 30 MG TABLET    Take 1 tablet (30 mg total) by mouth daily.   RELION PEN NEEDLES 32G X 4 MM MISC    USE ONCE DAILY TO ADMINISTER INSULIN  Modified Medications   No medications on file  Discontinued Medications   FERROUS SULFATE 325 (65 FE) MG EC TABLET    Take 325 mg by mouth daily with breakfast.   SIMVASTATIN (ZOCOR) 80 MG TABLET    Take 1 tablet (80 mg total) by mouth at bedtime.

## 2017-09-06 DIAGNOSIS — I5022 Chronic systolic (congestive) heart failure: Secondary | ICD-10-CM | POA: Diagnosis not present

## 2017-09-06 DIAGNOSIS — J84112 Idiopathic pulmonary fibrosis: Secondary | ICD-10-CM | POA: Diagnosis not present

## 2017-09-06 DIAGNOSIS — J8410 Pulmonary fibrosis, unspecified: Secondary | ICD-10-CM | POA: Diagnosis not present

## 2017-09-06 DIAGNOSIS — I110 Hypertensive heart disease with heart failure: Secondary | ICD-10-CM | POA: Diagnosis not present

## 2017-09-12 ENCOUNTER — Telehealth: Payer: Self-pay | Admitting: Cardiology

## 2017-09-12 ENCOUNTER — Ambulatory Visit (INDEPENDENT_AMBULATORY_CARE_PROVIDER_SITE_OTHER): Payer: Medicare Other

## 2017-09-12 DIAGNOSIS — Z95810 Presence of automatic (implantable) cardiac defibrillator: Secondary | ICD-10-CM | POA: Diagnosis not present

## 2017-09-12 DIAGNOSIS — I5022 Chronic systolic (congestive) heart failure: Secondary | ICD-10-CM

## 2017-09-12 NOTE — Telephone Encounter (Signed)
Spoke with pt and reminded pt of remote transmission that is due today. Pt verbalized understanding.   

## 2017-09-13 NOTE — Progress Notes (Signed)
EPIC Encounter for ICM Monitoring  Patient Name: Terry Peck is a 77 y.o. male Date: 09/13/2017 Primary Care Physican: Biagio Borg, MD Primary Cardiologist:Nahser Electrophysiologist: Allred Dry Weight:177lbs Bi-V Pacing: >99%       Heart Failure questions reviewed, pt asymptomatic and feeling good.    Thoracic impedance normal but was abnormal suggesting fluid accumulation from 09/07/2017 - 09/11/2017.  Prescribed dosage: Furosemide 40 mg 1 tablet daily  Labs: 05/28/2017 Creatinine 0.82, BUN 18, Potassium 4.7, Sodium 141, EGFR 96.86 03/04/2017 Creatinine 0.91, BUN 14, Potassium 4.6, Sodium 144, EGFR 82-94 11/20/2016 Creatinine 0.84, BUN 19, Potassium 4.9, Sodium 141, EGFR 85-98 09/07/2016 Creatinine 0.84, BUN 19, Potassium 4.9, Sodium 141 08/16/2016 Creatinine 0.99, BUN 24, Potassium 4.7, Sodium 143 07/17/2016 Creatinine 0.86, BUN 54, Potassium 4.8, Sodium 141 02/09/2016 Creatinine 0.79, BUN 15, Potassium 5.1, Sodium 140 11/09/2015 Creatinine 0.82, BUN 18, Potassium 4.1, Sodium 138, EGFR 97.26  Recommendations: No changes.    Encouraged to call for fluid symptoms.  Follow-up plan: ICM clinic phone appointment on 10/14/2017.    Copy of ICM check sent to Dr. Rayann Heman.   3 month ICM trend: 09/12/2017    1 Year ICM trend:       Rosalene Billings, RN 09/13/2017 11:39 AM

## 2017-10-02 ENCOUNTER — Other Ambulatory Visit: Payer: Self-pay | Admitting: Internal Medicine

## 2017-10-03 ENCOUNTER — Encounter: Payer: Self-pay | Admitting: Internal Medicine

## 2017-10-03 MED ORDER — INSULIN GLARGINE 100 UNIT/ML SOLOSTAR PEN: PEN_INJECTOR | SUBCUTANEOUS | 11 refills | Status: DC

## 2017-10-07 ENCOUNTER — Other Ambulatory Visit: Payer: Self-pay | Admitting: Internal Medicine

## 2017-10-07 DIAGNOSIS — I5022 Chronic systolic (congestive) heart failure: Secondary | ICD-10-CM | POA: Diagnosis not present

## 2017-10-07 DIAGNOSIS — J84112 Idiopathic pulmonary fibrosis: Secondary | ICD-10-CM | POA: Diagnosis not present

## 2017-10-07 DIAGNOSIS — I110 Hypertensive heart disease with heart failure: Secondary | ICD-10-CM | POA: Diagnosis not present

## 2017-10-07 DIAGNOSIS — J8410 Pulmonary fibrosis, unspecified: Secondary | ICD-10-CM | POA: Diagnosis not present

## 2017-10-07 MED ORDER — INSULIN PEN NEEDLE 32G X 4 MM MISC: 3 refills | Status: DC

## 2017-10-14 ENCOUNTER — Ambulatory Visit (INDEPENDENT_AMBULATORY_CARE_PROVIDER_SITE_OTHER): Payer: Medicare Other

## 2017-10-14 DIAGNOSIS — Z95810 Presence of automatic (implantable) cardiac defibrillator: Secondary | ICD-10-CM

## 2017-10-14 DIAGNOSIS — I5022 Chronic systolic (congestive) heart failure: Secondary | ICD-10-CM

## 2017-10-15 NOTE — Progress Notes (Signed)
EPIC Encounter for ICM Monitoring  Patient Name: Terry Peck is a 77 y.o. male Date: 10/15/2017 Primary Care Physican: Biagio Borg, MD Primary Cardiologist:Nahser Electrophysiologist: Allred Dry Weight:177lbs Bi-V Pacing: >99%       Heart Failure questions reviewed, pt asymptomatic.   Thoracic impedance normal.  Prescribed dosage: Furosemide 40 mg 1 tablet daily  Labs: 05/28/2017 Creatinine 0.82, BUN 18, Potassium 4.7, Sodium 141, EGFR 96.86 03/04/2017 Creatinine 0.91, BUN 14, Potassium 4.6, Sodium 144, EGFR 82-94 11/20/2016 Creatinine 0.84, BUN 19, Potassium 4.9, Sodium 141, EGFR 85-98 09/07/2016 Creatinine 0.84, BUN 19, Potassium 4.9, Sodium 141 08/16/2016 Creatinine 0.99, BUN 24, Potassium 4.7, Sodium 143 07/17/2016 Creatinine 0.86, BUN 54, Potassium 4.8, Sodium 141 02/09/2016 Creatinine 0.79, BUN 15, Potassium 5.1, Sodium 140 11/09/2015 Creatinine 0.82, BUN 18, Potassium 4.1, Sodium 138, EGFR 97.26  Recommendations: No changes.   Encouraged to call for fluid symptoms.  Follow-up plan: ICM clinic phone appointment on 11/14/2017.  Office appointment scheduled 11/19/2017 with Dr. Acie Fredrickson.   Copy of ICM check sent to Dr. Rayann Heman.   3 month ICM trend: 10/14/2017    1 Year ICM trend:       Rosalene Billings, RN 10/15/2017 11:07 AM

## 2017-10-31 ENCOUNTER — Encounter: Payer: Self-pay | Admitting: Cardiovascular Disease

## 2017-11-05 ENCOUNTER — Other Ambulatory Visit: Payer: Self-pay | Admitting: Cardiovascular Disease

## 2017-11-06 DIAGNOSIS — I5022 Chronic systolic (congestive) heart failure: Secondary | ICD-10-CM | POA: Diagnosis not present

## 2017-11-06 DIAGNOSIS — J84112 Idiopathic pulmonary fibrosis: Secondary | ICD-10-CM | POA: Diagnosis not present

## 2017-11-06 DIAGNOSIS — J8410 Pulmonary fibrosis, unspecified: Secondary | ICD-10-CM | POA: Diagnosis not present

## 2017-11-06 DIAGNOSIS — I110 Hypertensive heart disease with heart failure: Secondary | ICD-10-CM | POA: Diagnosis not present

## 2017-11-14 ENCOUNTER — Encounter: Payer: Self-pay | Admitting: Cardiovascular Disease

## 2017-11-14 ENCOUNTER — Ambulatory Visit (INDEPENDENT_AMBULATORY_CARE_PROVIDER_SITE_OTHER): Payer: Medicare Other

## 2017-11-14 DIAGNOSIS — Z95810 Presence of automatic (implantable) cardiac defibrillator: Secondary | ICD-10-CM

## 2017-11-14 DIAGNOSIS — I5022 Chronic systolic (congestive) heart failure: Secondary | ICD-10-CM

## 2017-11-14 NOTE — Progress Notes (Signed)
EPIC Encounter for ICM Monitoring  Patient Name: Terry Peck is a 77 y.o. male Date: 11/14/2017 Primary Care Physican: Biagio Borg, MD Primary Cardiologist:Nahser Electrophysiologist: Allred Dry Weight:177lbs Bi-V Pacing: >99%      Heart Failure questions reviewed, pt asymptomatic.  He described breathing and weight at baseline.  No changes in diet and unsure what may have caused fluid accumulation.    Thoracic impedance abnormal suggesting fluid accumulation starting 11/05/2017.  Impedance is trending closer to baseline.   Prescribed dosage: Furosemide 40 mg 1 tablet daily  Labs: 05/28/2017 Creatinine 0.82, BUN 18, Potassium 4.7, Sodium 141, EGFR 96.86 03/04/2017 Creatinine 0.91, BUN 14, Potassium 4.6, Sodium 144, EGFR 82-94 11/20/2016 Creatinine 0.84, BUN 19, Potassium 4.9, Sodium 141, EGFR 85-98 09/07/2016 Creatinine 0.84, BUN 19, Potassium 4.9, Sodium 141 08/16/2016 Creatinine 0.99, BUN 24, Potassium 4.7, Sodium 143 07/17/2016 Creatinine 0.86, BUN 54, Potassium 4.8, Sodium 141  Recommendations: Advised to take Furosemide 40 mg 1 tablet twice a day x 2 days and then return to 1 tablet a day.   Follow-up plan: ICM clinic phone appointment on 11/22/2017 to recheck fluid levels.      Copy of ICM check sent to Dr. Rayann Heman and Dr Acie Fredrickson.   3 month ICM trend: 11/14/2017    1 Year ICM trend:       Rosalene Billings, RN 11/14/2017 12:03 PM

## 2017-11-15 DIAGNOSIS — E119 Type 2 diabetes mellitus without complications: Secondary | ICD-10-CM | POA: Diagnosis not present

## 2017-11-15 LAB — HM DIABETES EYE EXAM

## 2017-11-19 ENCOUNTER — Ambulatory Visit: Payer: Medicare Other | Admitting: Cardiovascular Disease

## 2017-11-22 ENCOUNTER — Ambulatory Visit (INDEPENDENT_AMBULATORY_CARE_PROVIDER_SITE_OTHER): Payer: Self-pay

## 2017-11-22 DIAGNOSIS — I5022 Chronic systolic (congestive) heart failure: Secondary | ICD-10-CM

## 2017-11-22 DIAGNOSIS — Z95810 Presence of automatic (implantable) cardiac defibrillator: Secondary | ICD-10-CM

## 2017-11-22 NOTE — Progress Notes (Signed)
EPIC Encounter for ICM Monitoring  Patient Name: Terry Peck is a 77 y.o. male Date: 11/22/2017 Primary Care Physican: Biagio Borg, MD Primary Cardiologist:Nahser Electrophysiologist: Allred Dry Weight:177lbs Bi-V Pacing: >99%      Heart Failure questions reviewed, pt asymptomatic.  Weight is stable   Thoracic impedance improved and closer to baseline normal after taking 2 days of extra Furosemide.  Patient took extra Furosemide x 5 days instead of 2 as instructed.  Prescribed dosage: Furosemide 40 mg 1 tablet daily  Labs: 05/28/2017 Creatinine 0.82, BUN 18, Potassium 4.7, Sodium 141, EGFR 96.86 03/04/2017 Creatinine 0.91, BUN 14, Potassium 4.6, Sodium 144, EGFR 82-94 11/20/2016 Creatinine 0.84, BUN 19, Potassium 4.9, Sodium 141, EGFR 85-98 09/07/2016 Creatinine 0.84, BUN 19, Potassium 4.9, Sodium 141 08/16/2016 Creatinine 0.99, BUN 24, Potassium 4.7, Sodium 143 07/17/2016 Creatinine 0.86, BUN 54, Potassium 4.8, Sodium 141  Recommendations: Advised to review food labels for hidden salt contents and limit to less than 2000 mg daily.  Encouraged to call for fluid symptoms.  Follow-up plan: ICM clinic phone appointment on 12/03/2017 to recheck fluid levels.   Office appointment scheduled 12/16/2017 with Dr. Acie Fredrickson.    Copy of ICM check sent to Dr. Rayann Heman and Dr Acie Fredrickson for review and if any further recommendations will call back.    3 month ICM trend: 11/22/2017    1 Year ICM trend:       Rosalene Billings, RN 11/22/2017 1:55 PM

## 2017-11-26 ENCOUNTER — Ambulatory Visit (INDEPENDENT_AMBULATORY_CARE_PROVIDER_SITE_OTHER): Payer: Medicare Other | Admitting: Internal Medicine

## 2017-11-26 ENCOUNTER — Other Ambulatory Visit (INDEPENDENT_AMBULATORY_CARE_PROVIDER_SITE_OTHER): Payer: Medicare Other

## 2017-11-26 ENCOUNTER — Encounter: Payer: Self-pay | Admitting: Internal Medicine

## 2017-11-26 ENCOUNTER — Other Ambulatory Visit: Payer: Self-pay | Admitting: Internal Medicine

## 2017-11-26 VITALS — BP 122/82 | HR 63 | Temp 97.8°F | Ht 68.0 in | Wt 175.0 lb

## 2017-11-26 DIAGNOSIS — I110 Hypertensive heart disease with heart failure: Secondary | ICD-10-CM

## 2017-11-26 DIAGNOSIS — I5022 Chronic systolic (congestive) heart failure: Secondary | ICD-10-CM | POA: Diagnosis not present

## 2017-11-26 DIAGNOSIS — Z0000 Encounter for general adult medical examination without abnormal findings: Secondary | ICD-10-CM | POA: Diagnosis not present

## 2017-11-26 DIAGNOSIS — E119 Type 2 diabetes mellitus without complications: Secondary | ICD-10-CM

## 2017-11-26 DIAGNOSIS — E785 Hyperlipidemia, unspecified: Secondary | ICD-10-CM

## 2017-11-26 LAB — LIPID PANEL
CHOL/HDL RATIO: 2
Cholesterol: 99 mg/dL (ref 0–200)
HDL: 40.8 mg/dL (ref >39.00)
LDL CALC: 38 mg/dL (ref 0–99)
NONHDL: 58.04
Triglycerides: 100 mg/dL (ref 0.0–149.0)
VLDL: 20 mg/dL (ref 0.0–40.0)

## 2017-11-26 LAB — HEPATIC FUNCTION PANEL
ALK PHOS: 46 U/L (ref 39–117)
ALT: 20 U/L (ref 0–53)
AST: 21 U/L (ref 0–37)
Albumin: 4.1 g/dL (ref 3.5–5.2)
BILIRUBIN DIRECT: 0.1 mg/dL (ref 0.0–0.3)
BILIRUBIN TOTAL: 0.5 mg/dL (ref 0.2–1.2)
Total Protein: 7.6 g/dL (ref 6.0–8.3)

## 2017-11-26 LAB — BASIC METABOLIC PANEL
BUN: 23 mg/dL (ref 6–23)
CHLORIDE: 105 meq/L (ref 96–112)
CO2: 29 meq/L (ref 19–32)
CREATININE: 0.9 mg/dL (ref 0.40–1.50)
Calcium: 9.8 mg/dL (ref 8.4–10.5)
GFR: 86.88 mL/min (ref >60.00)
GLUCOSE: 71 mg/dL (ref 70–99)
POTASSIUM: 4.5 meq/L (ref 3.5–5.1)
Sodium: 142 mEq/L (ref 135–145)

## 2017-11-26 LAB — HEMOGLOBIN A1C: HEMOGLOBIN A1C: 7.6 % — AB (ref 4.6–6.5)

## 2017-11-26 MED ORDER — METFORMIN HCL ER 500 MG PO TB24: 2000.0000 mg | ORAL_TABLET | Freq: Every day | ORAL | 3 refills | Status: DC

## 2017-11-26 NOTE — Assessment & Plan Note (Signed)
stable overall by history and exam, recent data reviewed with pt, and pt to continue medical treatment as before,  to f/u any worsening symptoms or concerns BP Readings from Last 3 Encounters:  11/26/17 122/82  08/19/17 128/72  05/28/17 128/86

## 2017-11-26 NOTE — Patient Instructions (Signed)

## 2017-11-26 NOTE — Progress Notes (Signed)
Subjective:    Patient ID: Terry Peck, male    DOB: April 02, 1941, 77 y.o.   MRN: [id number]  HPI  Here to f/u; overall doing ok,  Pt denies chest pain, increasing sob or doe, wheezing, orthopnea, PND, increased LE swelling, palpitations, dizziness or syncope.  Pt denies new neurological symptoms such as new headache, or facial or extremity weakness or numbness.  Pt denies polydipsia, polyuria, or low sugar episode.  Pt states overall good compliance with meds, mostly trying to follow appropriate diet, with wt overall stable,  but little exercise however  Lost some wt with better diet, sugars better with FBS in the AM about 90 or higher, but pulm fibrosis has been worsening, can still ride the lawnmower for cutting grass, has port o2 concentrator for shopping.  No o2 requirement with sitting, but sat usually drop 90 to 80% walking 100 ft.   Past Medical History:  Diagnosis Date  . Abnormal ANCA test 07/03/2011  . Allergy   . Arthritis    "mild; right shoulder" (01/29/2012)  . B12 deficiency anemia   . Barrett's esophagus 11/23/2010  . Bundle branch block, left   . Cataract    L eye surgery  . CHF (congestive heart failure) ([place])    pacemaker  . Chronic systolic dysfunction of left ventricle    EF 30%  . Colon polyps 11/23/2010  . GERD (gastroesophageal reflux disease)   . Hyperlipidemia 10/14/2012  . Hypertension   . ICD (implantable cardiac defibrillator) in place   . Iron deficiency anemia 05/30/2011  . Liver abscess 1980's  . Nonischemic cardiomyopathy ([place])    s/p St. Jude BiV ICD 01/29/12  . Pulmonary fibrosis ([place])   . Pulmonary nodules   . Shortness of breath    "related to heart being out of rhythm" (01/29/2012)  . Type II diabetes mellitus ([place])    Past Surgical History:  Procedure Laterality Date  . abscess drained  ?1980's   liver  . BI-VENTRICULAR IMPLANTABLE CARDIOVERTER DEFIBRILLATOR N/A 01/29/2012   Procedure: BI-VENTRICULAR IMPLANTABLE CARDIOVERTER DEFIBRILLATOR   (CRT-D);  Surgeon: Thompson Grayer, MD;  Location: [facility] CATH LAB;  Service: Cardiovascular;  Laterality: N/A;  . CARDIAC DEFIBRILLATOR PLACEMENT  01/29/2012   SJM Quadra Assura BiV ICD implanted by Dr Rayann Heman  . CATARACT EXTRACTION Left   . COLONOSCOPY    . TONSILLECTOMY  1952  . UPPER GI ENDOSCOPY      reports that he quit smoking about 29 years ago. His smoking use included cigarettes. He has a 5.00 pack-year smoking history. He has never used smokeless tobacco. He reports that he does not drink alcohol or use drugs. family history includes Heart disease in his mother. Allergies  Allergen Reactions  . Penicillins Anaphylaxis and Other (See Comments)    "swelling of eyes; throat; could breath good" (01/29/2012)  . Iodine Other (See Comments)    "if I get iodine dye, I'll get a fever" (01/29/2012)  . Fish Oil Itching    Pt said only when given through IV   Current Outpatient Medications on File Prior to Visit  Medication Sig Dispense Refill  . acetaminophen-codeine (TYLENOL #3) 300-30 MG tablet Take 1 tablet by mouth every 8 (eight) hours as needed for moderate pain. 30 tablet 0  . aspirin EC 81 MG tablet Take 81 mg by mouth daily.    Marland Kitchen atorvastatin (LIPITOR) 20 MG tablet TAKE ONE TABLET BY MOUTH  DAILY 90 tablet 2  . Calcium-Magnesium-Zinc 1000-400-15 MG TABS Take 1  tablet by mouth daily.     . carvedilol (COREG) 25 MG tablet TAKE 1 TABLET BY MOUTH TWICE DAILY WITH MEALS 180 tablet 0  . Cholecalciferol (VITAMIN D) 1000 UNITS capsule Take 1,000 Units by mouth daily.     . Cyanocobalamin (B-12 PO) Take 1 tablet by mouth daily.     . Fluticasone Propionate, Inhal, (FLOVENT DISKUS) 250 MCG/BLIST AEPB Inhale 1 puff into the lungs 2 (two) times daily. 60 each 2  . furosemide (LASIX) 40 MG tablet Take 1 tablet (40 mg total) by mouth daily. 30 tablet 11  . ibuprofen (ADVIL,MOTRIN) 200 MG tablet Take 200-300 mg by mouth 2 (two) times daily as needed. For pain    . Insulin Glargine (LANTUS SOLOSTAR) 100  UNIT/ML Solostar Pen INJECT 50 UNITS SUBCUTANEOUSLY ONCE DAILY 5 pen 11  . Insulin Pen Needle (RELION PEN NEEDLES) 32G X 4 MM MISC Use as directed once daily E11.9 100 each 3  . Lutein 20 MG CAPS Take 20 mg by mouth daily.    . metFORMIN (GLUCOPHAGE) 850 MG tablet TAKE 1 TABLET BY MOUTH THREE TIMES DAILY 270 tablet 1  . methocarbamol (ROBAXIN) 500 MG tablet TAKE ONE TABLET BY MOUTH ONCE DAILY AS NEEDED 30 tablet 0  . Multiple Vitamin (MULTIVITAMIN WITH MINERALS) TABS Take 1 tablet by mouth daily.    . OMEGA 3 1000 MG CAPS Take 1,000 mg by mouth 2 (two) times daily.     Marland Kitchen omeprazole (PRILOSEC) 20 MG capsule TAKE 1 CAPSULE BY MOUTH TWICE DAILY . 180 capsule 3  . pioglitazone (ACTOS) 30 MG tablet Take 1 tablet (30 mg total) by mouth daily. 90 tablet 3  . losartan (COZAAR) 50 MG tablet Take 1 tablet (50 mg total) by mouth daily. 90 tablet 3   No current facility-administered medications on file prior to visit.    Review of Systems  Constitutional: Negative for other unusual diaphoresis or sweats HENT: Negative for ear discharge or swelling Eyes: Negative for other worsening visual disturbances Respiratory: Negative for stridor or other swelling  Gastrointestinal: Negative for worsening distension or other blood Genitourinary: Negative for retention or other urinary change Musculoskeletal: Negative for other MSK pain or swelling Skin: Negative for color change or other new lesions Neurological: Negative for worsening tremors and other numbness  Psychiatric/Behavioral: Negative for worsening agitation or other fatigue All other system neg per pt    Objective:   Physical Exam BP 122/82   Pulse 63   Temp 97.8 F (36.6 C) (Oral)   Ht 5\' 8"  (1.727 m)   Wt 175 lb (79.4 kg)   BMI 26.61 kg/m  VS noted,  Constitutional: Pt appears in NAD HENT: Head: NCAT.  Right Ear: External ear normal.  Left Ear: External ear normal.  Eyes: . Pupils are equal, round, and reactive to light. Conjunctivae  and EOM are normal Nose: without d/c or deformity Neck: Neck supple. Gross normal ROM Cardiovascular: Normal rate and regular rhythm.   Pulmonary/Chest: Effort normal and breath sounds decreased without rales or wheezing.  Abd:  Soft, NT, ND, + BS, no organomegaly Neurological: Pt is alert. At baseline orientation, motor grossly intact Skin: Skin is warm. No rashes, other new lesions, no LE edema Psychiatric: Pt behavior is normal without agitation  Lab Results  Component Value Date   WBC 7.4 05/28/2017   HGB 15.7 05/28/2017   HCT 47.4 05/28/2017   PLT 261.0 05/28/2017   GLUCOSE 79 05/28/2017   CHOL 111 05/28/2017   TRIG  128.0 05/28/2017   HDL 48.30 05/28/2017   LDLDIRECT 179.2 04/10/2013   LDLCALC 37 05/28/2017   ALT 27 05/28/2017   AST 26 05/28/2017   NA 141 05/28/2017   K 4.7 05/28/2017   CL 102 05/28/2017   CREATININE 0.82 05/28/2017   BUN 18 05/28/2017   CO2 30 05/28/2017   TSH 2.93 05/28/2017   PSA 0.29 05/28/2017   HGBA1C 6.7 (H) 05/28/2017   MICROALBUR 1.2 05/28/2017       Assessment & Plan:

## 2017-11-26 NOTE — Assessment & Plan Note (Signed)
stable overall by history and exam, recent data reviewed with pt, and pt to continue medical treatment as before,  to f/u any worsening symptoms or concerns, for f/u lab today 

## 2017-12-02 ENCOUNTER — Other Ambulatory Visit: Payer: Self-pay | Admitting: Internal Medicine

## 2017-12-03 ENCOUNTER — Ambulatory Visit (INDEPENDENT_AMBULATORY_CARE_PROVIDER_SITE_OTHER): Payer: Medicare Other

## 2017-12-03 DIAGNOSIS — I5022 Chronic systolic (congestive) heart failure: Secondary | ICD-10-CM

## 2017-12-03 DIAGNOSIS — Z95810 Presence of automatic (implantable) cardiac defibrillator: Secondary | ICD-10-CM

## 2017-12-03 NOTE — Progress Notes (Signed)
EPIC Encounter for ICM Monitoring  Patient Name: Terry Peck is a 77 y.o. male Date: 12/03/2017 Primary Care Physican: Biagio Borg, MD Primary Cardiologist:Nahser Electrophysiologist: Allred Dry Weight:175lbs Bi-V Pacing: >99%      Heart Failure questions reviewed, pt asymptomatic.   Thoracic impedance returned to normal in response to taking extra Furosemide x 5 days.  Prescribed dosage: Furosemide 40 mg 1 tablet daily  Labs: 05/28/2017 Creatinine 0.82, BUN 18, Potassium 4.7, Sodium 141, EGFR 96.86 03/04/2017 Creatinine 0.91, BUN 14, Potassium 4.6, Sodium 144, EGFR 82-94 11/20/2016 Creatinine 0.84, BUN 19, Potassium 4.9, Sodium 141, EGFR 85-98 09/07/2016 Creatinine 0.84, BUN 19, Potassium 4.9, Sodium 141 08/16/2016 Creatinine 0.99, BUN 24, Potassium 4.7, Sodium 143 07/17/2016 Creatinine 0.86, BUN 54, Potassium 4.8, Sodium 141  Recommendations: No changes.   Encouraged to call for fluid symptoms.  Follow-up plan: ICM clinic phone appointment on 12/16/2017.   Office appointment scheduled 12/16/2017 with Dr. Acie Fredrickson.    Copy of ICM check sent to Dr. Rayann Heman.   3 month ICM trend: 12/03/2017    1 Year ICM trend:       Rosalene Billings, RN 12/03/2017 1:36 PM

## 2017-12-07 DIAGNOSIS — J8410 Pulmonary fibrosis, unspecified: Secondary | ICD-10-CM | POA: Diagnosis not present

## 2017-12-07 DIAGNOSIS — J84112 Idiopathic pulmonary fibrosis: Secondary | ICD-10-CM | POA: Diagnosis not present

## 2017-12-07 DIAGNOSIS — I110 Hypertensive heart disease with heart failure: Secondary | ICD-10-CM | POA: Diagnosis not present

## 2017-12-07 DIAGNOSIS — I5022 Chronic systolic (congestive) heart failure: Secondary | ICD-10-CM | POA: Diagnosis not present

## 2017-12-16 ENCOUNTER — Ambulatory Visit: Payer: Medicare Other | Admitting: Cardiovascular Disease

## 2017-12-16 ENCOUNTER — Ambulatory Visit (INDEPENDENT_AMBULATORY_CARE_PROVIDER_SITE_OTHER): Payer: Medicare Other

## 2017-12-16 ENCOUNTER — Encounter: Payer: Self-pay | Admitting: Cardiovascular Disease

## 2017-12-16 VITALS — BP 122/62 | HR 80 | Ht 68.0 in | Wt 174.0 lb

## 2017-12-16 DIAGNOSIS — E782 Mixed hyperlipidemia: Secondary | ICD-10-CM | POA: Diagnosis not present

## 2017-12-16 DIAGNOSIS — I5022 Chronic systolic (congestive) heart failure: Secondary | ICD-10-CM | POA: Diagnosis not present

## 2017-12-16 DIAGNOSIS — Z95810 Presence of automatic (implantable) cardiac defibrillator: Secondary | ICD-10-CM

## 2017-12-16 NOTE — Progress Notes (Signed)
Terry Peck Date of Birth  07-03-1940       [address]    [place]    [place], [place]  [address]     [phone]  Fax  [phone]    Problem list: 1. Left bundle branch block 2. Diabetes mellitus 3. Hyperlipidemia 4. Hypertension 5. Congestive heart failure-EF equals 30-35% - has now 55%  with Bi-V pacer  6. Bi-V ICD placement 7. Idiopathic Pulmonary fibrosis    Previous notes:  Terry Peck is a 77 yo who presents for evaluation of dyspnea.  He has been found to have a LBBB and has CHF with an EF of 30-35%.  He gets dyspneac with exercise ( mowing the lawn).  He denies any PND or orthopnea. He describes generalized fatigue with exertion.  The shortness of breath has been present for about a year. He admits to eating a fair amount of salty foods. He eats hot dogs, ham, bacon, sausage. He does not use any exercise but instead uses potassium chloride ( No salt).  He was seen by his medical doctor and was found to have a left bundle branch block. and was referred here for further evaluation. It was at that point that he had an echocardiogram which revealed global left ventricular systolic dysfunction with an EF of 30-35%.  He had a stress Myoview study which reveals an ejection fraction 29%. There is global left ventricular dilatation. There is no evidence of ischemia. His symptoms have improved dramatically since we've stopped his verapamil and start him on carvedilol.  Continue to gradually titrate up his medications. We change his HCTZ to Lasix during his last visit. We also added lisinopril. He's feeling better.  He is able to do more of his normal activities without severe dyspnea.  December 10, 2011 He continues to gradually and steadily improved. He is now able to mow his lawn and edge  without any difficulties. He's doing quite well at cardiac rehabilitation to  October 17, 2012:  Terry Peck is doing great.  He has definitely seen a benefit from the BI-V pacer / ICD.  He's  able to do all of his yard work without significant difficulty or shortness of breath.  No chest pain.  We have not done a cardiac cath - he was not found to have significant ischemia on myoview and he has made tremendous progress with medical therapy for his CHF.   Echo in April , 2014 Study Conclusions  - Left ventricle: The cavity size was normal. Wall thickness was increased in a pattern of severe LVH. Systolic function was normal. The estimated ejection fraction was 55%. Wall motion was normal; there were no regional wall motion abnormalities. - Left atrium: The atrium was mildly dilated. - Atrial septum: No defect or patent foramen ovale was identified.  His LV EF has normalized with medical therapy and the ICD.   Dec. 2, 2014:  Terry Peck is doing well.  Exercising well.  No CP, no dyspnea.    November 02, 2013:  Terry Peck is doing well. He did not take his furosemide yesterday or today and so his blood pressure is a little bit higher. He takes his blood pressure on a regular basis at home and his readings are in the normal range.  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 13, 2016 Terry Peck is a 77 yo with hx of chronic systolic CHF.  He is  s/p Bi - V  ICD placement.   His EF has normalized since placement of the Bi-V ICD.  He is feeling much better. Exercising and doing all that he can do.   He now has a Optivol ( or equivalent)  Feature enabled .  Volume status is good.   November 01, 2014:  Has some DOE recently .  Very mild  Not eating any extra salt.   Oct. 27, 2016:  Terry Peck is doing well. Has normalized his LV function with the Bi-V pacer   August 16, 2015:  Terry Peck is seen back for follow up of his chf BP is elevated. Getting more normal readings at home  EF has normalized with his BI-V pacer   Oct. 23, 2017:   Doing well from a cardiac standpoint Busy doing yard work .  He had stopped his atorvastatin to see if that would  affect his blood glucose. -   His glucose was lower when he was off the statin . Lipids went up significantly .   August 17, 2016:  Terry Peck is doing well His Jerel Shepherd showed some possible fluid accumulation but the impedance was normal .  He is feeling great.  No shortness of breath   Nov. 12, 2018  Terry Peck is seen today for follow up of his CHF  Glucose has been good  EF has now normalized to 51-02% Grade 1 diastolic dysfunction   Aug. 26, 2019   Doing fine from a cardiac standpoint .  Is on home O2 at night.  With ambulation, his oxygen  saturation goes down to 75% unless he is wearing his home O2. EF has improved to 60-65% after Bi-V pacer      Current Outpatient Medications on File Prior to Visit  Medication Sig Dispense Refill  . acetaminophen-codeine (TYLENOL #3) 300-30 MG tablet Take 1 tablet by mouth every 8 (eight) hours as needed for moderate pain. 30 tablet 0  . aspirin EC 81 MG tablet Take 81 mg by mouth daily.    Marland Kitchen atorvastatin (LIPITOR) 20 MG tablet TAKE ONE TABLET BY MOUTH  DAILY 90 tablet 2  . Calcium-Magnesium-Zinc 1000-400-15 MG TABS Take 1 tablet by mouth daily.     . carvedilol (COREG) 25 MG tablet TAKE 1 TABLET BY MOUTH TWICE DAILY WITH MEALS 180 tablet 0  . Cholecalciferol (VITAMIN D) 1000 UNITS capsule Take 1,000 Units by mouth daily.     . Cyanocobalamin (B-12 PO) Take 1 tablet by mouth daily.     . Fluticasone Propionate, Inhal, (FLOVENT DISKUS) 250 MCG/BLIST AEPB Inhale 1 puff into the lungs 2 (two) times daily. 60 each 2  . furosemide (LASIX) 40 MG tablet Take 1 tablet (40 mg total) by mouth daily. 30 tablet 11  . ibuprofen (ADVIL,MOTRIN) 200 MG tablet Take 200-300 mg by mouth 2 (two) times daily as needed. For pain    . Insulin Glargine (LANTUS SOLOSTAR) 100 UNIT/ML Solostar Pen INJECT 50 UNITS SUBCUTANEOUSLY ONCE DAILY 5 pen 11  . Insulin Pen Needle (RELION PEN NEEDLES) 32G X 4 MM MISC Use as directed once daily E11.9 100 each 3  . losartan (COZAAR) 50 MG  tablet Take 1 tablet (50 mg total) by mouth daily. 90 tablet 3  . Lutein 20 MG CAPS Take 20 mg by mouth daily.    . metFORMIN (GLUCOPHAGE-XR) 500 MG 24 hr tablet Take 4 tablets (2,000 mg total) by mouth daily with breakfast. 360 tablet 3  . methocarbamol (ROBAXIN) 500 MG tablet TAKE ONE  TABLET BY MOUTH ONCE DAILY AS NEEDED 30 tablet 0  . Multiple Vitamin (MULTIVITAMIN WITH MINERALS) TABS Take 1 tablet by mouth daily.    . OMEGA 3 1000 MG CAPS Take 1,000 mg by mouth 2 (two) times daily.     Marland Kitchen omeprazole (PRILOSEC) 20 MG capsule TAKE 1 CAPSULE BY MOUTH TWICE DAILY . 180 capsule 3  . pioglitazone (ACTOS) 30 MG tablet TAKE 1 TABLET BY MOUTH ONCE DAILY 90 tablet 3   No current facility-administered medications on file prior to visit.     Allergies  Allergen Reactions  . Penicillins Anaphylaxis and Other (See Comments)    "swelling of eyes; throat; could breath good" (01/29/2012)  . Iodine Other (See Comments)    "if I get iodine dye, I'll get a fever" (01/29/2012)  . Fish Oil Itching    Pt said only when given through IV    Past Medical History:  Diagnosis Date  . Abnormal ANCA test 07/03/2011  . Allergy   . Arthritis    "mild; right shoulder" (01/29/2012)  . B12 deficiency anemia   . Barrett's esophagus 11/23/2010  . Bundle branch block, left   . Cataract    L eye surgery  . CHF (congestive heart failure) ([place])    pacemaker  . Chronic systolic dysfunction of left ventricle    EF 30%  . Colon polyps 11/23/2010  . GERD (gastroesophageal reflux disease)   . Hyperlipidemia 10/14/2012  . Hypertension   . ICD (implantable cardiac defibrillator) in place   . Iron deficiency anemia 05/30/2011  . Liver abscess 1980's  . Nonischemic cardiomyopathy ([place])    s/p St. Jude BiV ICD 01/29/12  . Pulmonary fibrosis ([place])   . Pulmonary nodules   . Shortness of breath    "related to heart being out of rhythm" (01/29/2012)  . Type II diabetes mellitus ([place])     Past Surgical History:  Procedure  Laterality Date  . abscess drained  ?1980's   liver  . BI-VENTRICULAR IMPLANTABLE CARDIOVERTER DEFIBRILLATOR N/A 01/29/2012   Procedure: BI-VENTRICULAR IMPLANTABLE CARDIOVERTER DEFIBRILLATOR  (CRT-D);  Surgeon: Thompson Grayer, MD;  Location[facility] CATH LAB;  Service: Cardiovascular;  Laterality: N/A;  . CARDIAC DEFIBRILLATOR PLACEMENT  01/29/2012   SJM Quadra Assura BiV ICD implanted by Dr Rayann Heman  . CATARACT EXTRACTION Left   . COLONOSCOPY    . TONSILLECTOMY  1952  . UPPER GI ENDOSCOPY      Social History   Tobacco Use  Smoking Status Former Smoker  . Packs/day: 0.50  . Years: 10.00  . Pack years: 5.00  . Types: Cigarettes  . Last attempt to quit: 04/23/1988  . Years since quitting: 29.6  Smokeless Tobacco Never Used    Social History   Substance and Sexual Activity  Alcohol Use No  . Alcohol/week: 0.0 standard drinks    Family History  Problem Relation Age of Onset  . Heart disease Mother   . Colon cancer Neg Hx   . Esophageal cancer Neg Hx   . Stomach cancer Neg Hx     Reviw of Systems:  Reviewed in the HPI.  All other systems are negative.  Physical Exam: Blood pressure 122/62, pulse 80, height 5\' 8"  (1.727 m), weight 174 lb (78.9 kg), SpO2 (!) 87 %.  GEN:  Well nourished, well developed in no acute distress HEENT: Normal NECK: No JVD; No carotid bruits LYMPHATICS: No lymphadenopathy CARDIAC: RRR , no murmurs, rubs, gallops RESPIRATORY:  Fine rales in bases  ABDOMEN: Soft,  non-tender, non-distended MUSCULOSKELETAL:  No edema; No deformity  SKIN: Warm and dry NEUROLOGIC:  Alert and oriented x 3   ECG: Aug. 26, 2019 Atrial sensed and biventricular pacing at a rate of 80.    Assessment / Plan:   1. Left bundle branch block-that is post biventricular pacer.  He is doing well. 2. Diabetes mellitus  3. Hyperlipidemia - labs look great   4. Hypertension - BP is well controlled.   5. Congestive heart failure-    His EF had improved from 30% to 50% by echo in  2018.       He is doing great.  Out doing yard work regularly . Continue meds   6. Bi-V ICD placement -   Managed by Dr. Marletta Lor, MD  12/16/2017 2:37 PM    [address],  [place], [place]  [address] Pager [phone] Phone: [phone]; Fax: [phone]

## 2017-12-16 NOTE — Patient Instructions (Signed)
Medication Instructions:  Your physician recommends that you continue on your current medications as directed. Please refer to the Current Medication list given to you today.   Labwork: None Ordered   Testing/Procedures: None Ordered   Follow-Up: Your physician wants you to follow-up in: 6 months with Scott Weaver, PA.  You will receive a reminder letter in the mail two months in advance. If you don't receive a letter, please call our office to schedule the follow-up appointment.   If you need a refill on your cardiac medications before your next appointment, please call your pharmacy.   Thank you for choosing [facility]! Earlean Fidalgo, RN [phone]    

## 2017-12-16 NOTE — Progress Notes (Signed)
EPIC Encounter for ICM Monitoring  Patient Name: Terry Peck is a 77 y.o. male Date: 12/16/2017 Primary Care Physican: Biagio Borg, MD Primary Cardiologist:Nahser Electrophysiologist: Allred Dry Weight:  175lbs Bi-V Pacing: >99%           Heart Failure questions reviewed, pt asymptomatic.   Thoracic impedance normal.  Prescribed dosage: Furosemide 40 mg 1 tablet daily  Labs: 11/26/2017 Creatinine 0.90, BUN 23, Potassium 4.5, Sodium 142, EGFR 86.88 05/28/2017 Creatinine 0.82, BUN 18, Potassium 4.7, Sodium 141, EGFR 96.86 03/04/2017 Creatinine 0.91, BUN 14, Potassium 4.6, Sodium 144, EGFR 82-94 11/20/2016 Creatinine 0.84, BUN 19, Potassium 4.9, Sodium 141, EGFR 85-98 09/07/2016 Creatinine 0.84, BUN 19, Potassium 4.9, Sodium 141 08/16/2016 Creatinine 0.99, BUN 24, Potassium 4.7, Sodium 143 07/17/2016 Creatinine 0.86, BUN 54, Potassium 4.8, Sodium 141  Recommendations: No changes.  Encouraged to call for fluid symptoms.  Follow-up plan: ICM clinic phone appointment on 01/20/2018.   Office appointment scheduled 12/16/2017 with Dr. Acie Fredrickson.    Copy of ICM check sent to Dr. Lamount Cohen.   3 month ICM trend: 12/16/2017    1 Year ICM trend:       Rosalene Billings, RN 12/16/2017 8:39 AM

## 2017-12-30 ENCOUNTER — Other Ambulatory Visit: Payer: Self-pay | Admitting: Cardiovascular Disease

## 2018-01-07 DIAGNOSIS — I110 Hypertensive heart disease with heart failure: Secondary | ICD-10-CM | POA: Diagnosis not present

## 2018-01-07 DIAGNOSIS — I5022 Chronic systolic (congestive) heart failure: Secondary | ICD-10-CM | POA: Diagnosis not present

## 2018-01-07 DIAGNOSIS — J8410 Pulmonary fibrosis, unspecified: Secondary | ICD-10-CM | POA: Diagnosis not present

## 2018-01-07 DIAGNOSIS — J84112 Idiopathic pulmonary fibrosis: Secondary | ICD-10-CM | POA: Diagnosis not present

## 2018-01-20 ENCOUNTER — Ambulatory Visit (INDEPENDENT_AMBULATORY_CARE_PROVIDER_SITE_OTHER): Payer: Medicare Other

## 2018-01-20 ENCOUNTER — Telehealth: Payer: Self-pay | Admitting: Cardiology

## 2018-01-20 ENCOUNTER — Telehealth: Payer: Self-pay

## 2018-01-20 DIAGNOSIS — Z95810 Presence of automatic (implantable) cardiac defibrillator: Secondary | ICD-10-CM

## 2018-01-20 DIAGNOSIS — I5022 Chronic systolic (congestive) heart failure: Secondary | ICD-10-CM

## 2018-01-20 NOTE — Telephone Encounter (Signed)
Spoke with pt and reminded pt of remote transmission that is due today. Pt verbalized understanding.   

## 2018-01-20 NOTE — Progress Notes (Signed)
EPIC Encounter for ICM Monitoring  Patient Name: Terry Peck is a 77 y.o. male Date: 01/20/2018 Primary Care Physican: Biagio Borg, MD Primary Cardiologist:Nahser Electrophysiologist: Allred Dry Weight:  Previous weight 175lbs Bi-V Pacing: >99%      Attempted call to patient and unable to reach.  Left message to return call, per DPR, regarding transmission.  Transmission reviewed.    Thoracic impedance but was abnormal suggesting fluid accumulation starting 01/17/2018.  Prescribed: Furosemide 40 mg 1 tablet daily  Labs: 11/26/2017 Creatinine 0.90, BUN 23, Potassium 4.5, Sodium 142, EGFR 86.88 05/28/2017 Creatinine 0.82, BUN 18, Potassium 4.7, Sodium 141, EGFR 96.86  Recommendations:  Unable to reach.  Will advise to increase Furosemide 40 mg to 1 tablet twice a day x 3 days if he is reached.  Follow-up plan: ICM clinic phone appointment on 01/28/2018 to recheck fluid levels.    Last EP visit was with Chanetta Marshall, NP on 02/02/2016.  Message sent to [place] to call patient to set up appointment.    Copy of ICM check sent to Dr. Rayann Heman and Dr Acie Fredrickson.   3 month ICM trend: 01/20/2018    1 Year ICM trend:       Rosalene Billings, RN 01/20/2018 3:13 PM

## 2018-01-20 NOTE — Telephone Encounter (Signed)
Remote ICM transmission received.  Attempted call to patient and left message, per DPR, to return call regarding transmission.

## 2018-01-23 NOTE — Progress Notes (Signed)
Spoke with patient and transmission reviewed.  Asked if there has been any changes in diet, fluid intake or eating out at restaurants more and he stated he.  He is at baseline and denied any fluid symptoms.  Weight stable at 174 lbs. Advise to increase Furosemide 40 mg to 1 tablet twice a day x 3 days and then return to prior prescribed dosage of 1 tablet daily.  He verbalized understanding.  Appointment scheduled with Dr Rayann Heman on 02/05/2018.

## 2018-01-28 ENCOUNTER — Telehealth: Payer: Self-pay

## 2018-01-28 ENCOUNTER — Ambulatory Visit (INDEPENDENT_AMBULATORY_CARE_PROVIDER_SITE_OTHER): Payer: Medicare Other

## 2018-01-28 ENCOUNTER — Ambulatory Visit (INDEPENDENT_AMBULATORY_CARE_PROVIDER_SITE_OTHER): Payer: Medicare Other | Admitting: *Deleted

## 2018-01-28 DIAGNOSIS — I5022 Chronic systolic (congestive) heart failure: Secondary | ICD-10-CM

## 2018-01-28 DIAGNOSIS — Z95810 Presence of automatic (implantable) cardiac defibrillator: Secondary | ICD-10-CM

## 2018-01-28 NOTE — Progress Notes (Signed)
EPIC Encounter for ICM Monitoring  Patient Name: Terry Peck is a 77 y.o. male Date: 01/28/2018 Primary Care Physican: John, James W, MD Primary Cardiologist:Nahser Electrophysiologist: Allred Dry Weight: Previous weight 175lbs Bi-V Pacing: >99%          Attempted call to patient and unable to reach.  Left detailed message, per DPR, regarding transmission.  Transmission reviewed.    Thoracic impedance returned to normal after taking 3 days of extra Furosemide.  Prescribed: Furosemide 40 mg 1 tablet daily  Labs: 11/26/2017 Creatinine 0.90, BUN 23, Potassium 4.5, Sodium 142, EGFR 86.88 05/28/2017 Creatinine 0.82, BUN 18, Potassium 4.7, Sodium 141, EGFR 96.86  Recommendations: Left voice mail with ICM number and encouraged to call if experiencing any fluid symptoms.  Follow-up plan: ICM clinic phone appointment on 03/06/2018.  Office appointment scheduled 02/05/2018 with Dr. Allred.    Copy of ICM check sent to Dr. Allred.   3 month ICM trend: 01/28/2018    1 Year ICM trend:        S , RN 01/28/2018 4:14 PM   

## 2018-01-28 NOTE — Telephone Encounter (Signed)
Remote ICM transmission received.  Attempted call to patient and left detailed message, per DPR, regarding transmission and next ICM scheduled for 03/06/2018.  Advised to return call for any fluid symptoms or questions.

## 2018-01-29 NOTE — Progress Notes (Signed)
Remote ICD transmission.   

## 2018-02-04 ENCOUNTER — Telehealth: Payer: Self-pay | Admitting: Internal Medicine

## 2018-02-04 NOTE — Telephone Encounter (Signed)
Returned call to Mr. Graef regarding AWV; no answer, could not leave message.  Patient will need to call office to schedule appointment. SF

## 2018-02-05 ENCOUNTER — Encounter: Payer: Self-pay | Admitting: Internal Medicine

## 2018-02-05 ENCOUNTER — Ambulatory Visit: Payer: Medicare Other | Admitting: Internal Medicine

## 2018-02-05 VITALS — BP 146/80 | HR 81 | Ht 68.0 in | Wt 171.2 lb

## 2018-02-05 DIAGNOSIS — Z95810 Presence of automatic (implantable) cardiac defibrillator: Secondary | ICD-10-CM

## 2018-02-05 DIAGNOSIS — I5022 Chronic systolic (congestive) heart failure: Secondary | ICD-10-CM | POA: Diagnosis not present

## 2018-02-05 DIAGNOSIS — I110 Hypertensive heart disease with heart failure: Secondary | ICD-10-CM

## 2018-02-05 NOTE — Progress Notes (Signed)
PCP: Biagio Borg, MD Primary Cardiologist: Dr Acie Fredrickson Primary EP: Dr Rayann Heman  Howell Terry Peck is a 77 y.o. male who presents today for routine electrophysiology followup.  Since last being seen in our clinic, the patient reports doing very well.  Today, he denies symptoms of palpitations, chest pain, shortness of breath,  lower extremity edema, dizziness, presyncope, syncope, or ICD shocks.  The patient is otherwise without complaint today.   Past Medical History:  Diagnosis Date  . Abnormal ANCA test 07/03/2011  . Allergy   . Arthritis    "mild; right shoulder" (01/29/2012)  . B12 deficiency anemia   . Barrett's esophagus 11/23/2010  . Bundle branch block, left   . Cataract    L eye surgery  . CHF (congestive heart failure) ([place])    pacemaker  . Chronic systolic dysfunction of left ventricle    EF 30%  . Colon polyps 11/23/2010  . GERD (gastroesophageal reflux disease)   . Hyperlipidemia 10/14/2012  . Hypertension   . ICD (implantable cardiac defibrillator) in place   . Iron deficiency anemia 05/30/2011  . Liver abscess 1980's  . Nonischemic cardiomyopathy ([place])    s/p [employer] Jude BiV ICD 01/29/12  . Pulmonary fibrosis ([place])   . Pulmonary nodules   . Shortness of breath    "related to heart being out of rhythm" (01/29/2012)  . Type II diabetes mellitus ([place])    Past Surgical History:  Procedure Laterality Date  . abscess drained  ?1980's   liver  . BI-VENTRICULAR IMPLANTABLE CARDIOVERTER DEFIBRILLATOR N/A 01/29/2012   Procedure: BI-VENTRICULAR IMPLANTABLE CARDIOVERTER DEFIBRILLATOR  (CRT-D);  Surgeon: Thompson Grayer, MD;  Location: [facility] CATH LAB;  Service: Cardiovascular;  Laterality: N/A;  . CARDIAC DEFIBRILLATOR PLACEMENT  01/29/2012   SJM Quadra Assura BiV ICD implanted by Dr Rayann Heman  . CATARACT EXTRACTION Left   . COLONOSCOPY    . TONSILLECTOMY  1952  . UPPER GI ENDOSCOPY      ROS- all systems are reviewed and negative except as per HPI above  Current Outpatient Medications    Medication Sig Dispense Refill  . acetaminophen-codeine (TYLENOL #3) 300-30 MG tablet Take 1 tablet by mouth every 8 (eight) hours as needed for moderate pain. 30 tablet 0  . aspirin EC 81 MG tablet Take 81 mg by mouth daily.    Marland Kitchen atorvastatin (LIPITOR) 20 MG tablet TAKE 1 TABLET BY MOUTH ONCE DAILY 90 tablet 3  . Calcium-Magnesium-Zinc 1000-400-15 MG TABS Take 1 tablet by mouth daily.     . carvedilol (COREG) 25 MG tablet TAKE 1 TABLET BY MOUTH TWICE DAILY WITH MEALS 180 tablet 0  . Cholecalciferol (VITAMIN D) 1000 UNITS capsule Take 1,000 Units by mouth daily.     . Cyanocobalamin (B-12 PO) Take 1 tablet by mouth daily.     . Fluticasone Propionate, Inhal, (FLOVENT DISKUS) 250 MCG/BLIST AEPB Inhale 1 puff into the lungs 2 (two) times daily. 60 each 2  . furosemide (LASIX) 40 MG tablet Take 1 tablet (40 mg total) by mouth daily. 30 tablet 11  . ibuprofen (ADVIL,MOTRIN) 200 MG tablet Take 200-300 mg by mouth 2 (two) times daily as needed. For pain    . Insulin Glargine (LANTUS SOLOSTAR) 100 UNIT/ML Solostar Pen INJECT 50 UNITS SUBCUTANEOUSLY ONCE DAILY 5 pen 11  . Insulin Pen Needle (RELION PEN NEEDLES) 32G X 4 MM MISC Use as directed once daily E11.9 100 each 3  . Lutein 20 MG CAPS Take 20 mg by mouth daily.    Marland Kitchen  metFORMIN (GLUCOPHAGE-XR) 500 MG 24 hr tablet Take 4 tablets (2,000 mg total) by mouth daily with breakfast. 360 tablet 3  . methocarbamol (ROBAXIN) 500 MG tablet TAKE ONE TABLET BY MOUTH ONCE DAILY AS NEEDED 30 tablet 0  . Multiple Vitamin (MULTIVITAMIN WITH MINERALS) TABS Take 1 tablet by mouth daily.    . OMEGA 3 1000 MG CAPS Take 1,000 mg by mouth 2 (two) times daily.     Marland Kitchen omeprazole (PRILOSEC) 20 MG capsule TAKE 1 CAPSULE BY MOUTH TWICE DAILY . 180 capsule 3  . pioglitazone (ACTOS) 30 MG tablet TAKE 1 TABLET BY MOUTH ONCE DAILY 90 tablet 3  . losartan (COZAAR) 50 MG tablet Take 1 tablet (50 mg total) by mouth daily. 90 tablet 3   No current facility-administered medications  for this visit.     Physical Exam: Vitals:   02/05/18 0854  BP: (!) 146/80  Pulse: 81  SpO2: 97%  Weight: 171 lb 3.2 oz (77.7 kg)  Height: 5\' 8"  (1.727 m)    GEN- The patient is well appearing, alert and oriented x 3 today.   Head- normocephalic, atraumatic Eyes-  Sclera clear, conjunctiva pink Ears- hearing intact Oropharynx- clear Lungs- Clear to ausculation bilaterally, normal work of breathing Chest- ICD pocket is well healed Heart- Regular rate and rhythm, no murmurs, rubs or gallops, PMI not laterally displaced GI- soft, NT, ND, + BS Extremities- no clubbing, cyanosis, or edema  ICD interrogation- reviewed in detail today,  See PACEART report  ekg tracing ordered today is personally reviewed and shows sinus with BiV pacing  Wt Readings from Last 3 Encounters:  02/05/18 171 lb 3.2 oz (77.7 kg)  12/16/17 174 lb (78.9 kg)  11/26/17 175 lb (79.4 kg)    Assessment and Plan:  1.  Chronic systolic dysfunction/LBBB euvolemic today EF has normalized with CRT by echo 09/07/16 (EF 60%!) Stable on an appropriate medical regimen Normal ICD function See Pace Art report No changes today followed in ICM device clinic  2. HTN Stable No change required today  [name] Return to see EP NP every year Follow-up with Dr Acie Fredrickson as scheduled  Thompson Grayer MD, [facility] 02/05/2018 9:30 AM

## 2018-02-05 NOTE — Patient Instructions (Addendum)
Medication Instructions:  Your physician recommends that you continue on your current medications as directed. Please refer to the Current Medication list given to you today.  Labwork: None ordered.  Testing/Procedures: None ordered.  Follow-Up: Your physician wants you to follow-up in: one year with Terry Marshall, NP.   You will receive a reminder letter in the mail two months in advance. If you don't receive a letter, please call our office to schedule the follow-up appointment.  Remote monitoring is used to monitor your ICD from home. This monitoring reduces the number of office visits required to check your device to one time per year. It allows [place] to keep an eye on the functioning of your device to ensure it is working properly. You are scheduled for a device check from home on 03/06/2018. You may send your transmission at any time that day. If you have a wireless device, the transmission will be sent automatically. After your physician reviews your transmission, you will receive a postcard with your next transmission date.  Any Other Special Instructions Will Be Listed Below (If Applicable).  If you need a refill on your cardiac medications before your next appointment, please call your pharmacy.

## 2018-02-06 ENCOUNTER — Encounter: Payer: Self-pay | Admitting: Cardiology

## 2018-02-06 DIAGNOSIS — I5022 Chronic systolic (congestive) heart failure: Secondary | ICD-10-CM | POA: Diagnosis not present

## 2018-02-06 DIAGNOSIS — I110 Hypertensive heart disease with heart failure: Secondary | ICD-10-CM | POA: Diagnosis not present

## 2018-02-06 DIAGNOSIS — J8410 Pulmonary fibrosis, unspecified: Secondary | ICD-10-CM | POA: Diagnosis not present

## 2018-02-06 DIAGNOSIS — J84112 Idiopathic pulmonary fibrosis: Secondary | ICD-10-CM | POA: Diagnosis not present

## 2018-02-11 ENCOUNTER — Other Ambulatory Visit: Payer: Self-pay | Admitting: Internal Medicine

## 2018-02-11 ENCOUNTER — Other Ambulatory Visit: Payer: Self-pay | Admitting: Cardiovascular Disease

## 2018-02-11 LAB — CUP PACEART INCLINIC DEVICE CHECK
Battery Remaining Longevity: 26 mo
Brady Statistic RA Percent Paced: 24 %
Date Time Interrogation Session: 20191016131013
HighPow Impedance: 64.125
Implantable Lead Implant Date: 20131008
Implantable Lead Location: 753859
Lead Channel Impedance Value: 387.5 Ohm
Lead Channel Pacing Threshold Amplitude: 1 V
Lead Channel Pacing Threshold Pulse Width: 0.5 ms
Lead Channel Sensing Intrinsic Amplitude: 11.4 mV
Lead Channel Sensing Intrinsic Amplitude: 3.6 mV
Lead Channel Setting Pacing Amplitude: 1.75 V
Lead Channel Setting Pacing Amplitude: 2 V
Lead Channel Setting Pacing Amplitude: 2.125
Lead Channel Setting Pacing Pulse Width: 0.5 ms
Lead Channel Setting Pacing Pulse Width: 0.5 ms
MDC IDC LEAD IMPLANT DT: 20131008
MDC IDC LEAD IMPLANT DT: 20131008
MDC IDC LEAD LOCATION: 753858
MDC IDC LEAD LOCATION: 753860
MDC IDC MSMT LEADCHNL LV IMPEDANCE VALUE: 950 Ohm
MDC IDC MSMT LEADCHNL LV PACING THRESHOLD AMPLITUDE: 0.75 V
MDC IDC MSMT LEADCHNL LV PACING THRESHOLD AMPLITUDE: 0.75 V
MDC IDC MSMT LEADCHNL LV PACING THRESHOLD PULSEWIDTH: 0.5 ms
MDC IDC MSMT LEADCHNL RA IMPEDANCE VALUE: 387.5 Ohm
MDC IDC MSMT LEADCHNL RA PACING THRESHOLD AMPLITUDE: 0.75 V
MDC IDC MSMT LEADCHNL RA PACING THRESHOLD PULSEWIDTH: 0.5 ms
MDC IDC MSMT LEADCHNL RV PACING THRESHOLD PULSEWIDTH: 0.5 ms
MDC IDC PG IMPLANT DT: 20131008
MDC IDC PG SERIAL: 7054987
MDC IDC SET LEADCHNL RV SENSING SENSITIVITY: 0.5 mV
MDC IDC STAT BRADY RV PERCENT PACED: 99.73 %

## 2018-02-18 ENCOUNTER — Ambulatory Visit: Payer: Medicare Other | Admitting: Pulmonary Disease

## 2018-02-18 ENCOUNTER — Encounter: Payer: Self-pay | Admitting: Pulmonary Disease

## 2018-02-18 ENCOUNTER — Ambulatory Visit (INDEPENDENT_AMBULATORY_CARE_PROVIDER_SITE_OTHER)
Admission: RE | Admit: 2018-02-18 | Discharge: 2018-02-18 | Disposition: A | Discharge status: Home / Self Care | Payer: Medicare Other | Source: Ambulatory Visit | Attending: Pulmonary Disease | Admitting: Pulmonary Disease

## 2018-02-18 VITALS — BP 132/76 | HR 85 | Temp 97.7°F | Ht 68.0 in | Wt 173.0 lb

## 2018-02-18 DIAGNOSIS — R911 Solitary pulmonary nodule: Secondary | ICD-10-CM | POA: Diagnosis not present

## 2018-02-18 DIAGNOSIS — I447 Left bundle-branch block, unspecified: Secondary | ICD-10-CM

## 2018-02-18 DIAGNOSIS — J84112 Idiopathic pulmonary fibrosis: Secondary | ICD-10-CM

## 2018-02-18 DIAGNOSIS — Z794 Long term (current) use of insulin: Secondary | ICD-10-CM

## 2018-02-18 DIAGNOSIS — J984 Other disorders of lung: Secondary | ICD-10-CM

## 2018-02-18 DIAGNOSIS — R0902 Hypoxemia: Secondary | ICD-10-CM

## 2018-02-18 DIAGNOSIS — I5022 Chronic systolic (congestive) heart failure: Secondary | ICD-10-CM

## 2018-02-18 DIAGNOSIS — G4734 Idiopathic sleep related nonobstructive alveolar hypoventilation: Secondary | ICD-10-CM | POA: Diagnosis not present

## 2018-02-18 DIAGNOSIS — I110 Hypertensive heart disease with heart failure: Secondary | ICD-10-CM

## 2018-02-18 DIAGNOSIS — Z95810 Presence of automatic (implantable) cardiac defibrillator: Secondary | ICD-10-CM

## 2018-02-18 DIAGNOSIS — E118 Type 2 diabetes mellitus with unspecified complications: Secondary | ICD-10-CM

## 2018-02-18 MED ORDER — BUDESONIDE 180 MCG/ACT IN AEPB: 2.0000 | INHALATION_SPRAY | Freq: Two times a day (BID) | RESPIRATORY_TRACT | 6 refills | Status: DC

## 2018-02-18 NOTE — Progress Notes (Addendum)
Subjective:     Patient ID: Terry Peck, male   DOB: 13-Dec-1940, 77 y.o.   MRN: [id number]  HPI    77 y/o WM, referred by Dr. Cathlean Cower for increased SOB>> He is a friend of my pt Terry Peck...             Terry Peck was prev evaluated in 2013-14 by MR & notes summarized below>>  IOV 06/20/2011- Referred for lung nodule and dyspnea:    77year old male. reports that he quit smoking about 23 years ago. His smoking use included Cigarettes. He has a 5 pack-year smoking history (1/2 pack per day x 10 years). He has never used smokeless tobacco. Body mass index is 28.15 kg/(m^2). Known DM with nocturia - started on insulin 06/20/2011, HBP, Arthritis, Barretts- seeing Dr Ardis Hughs    Repoirts hx of small lung nodule with serial CT fu at [facility] for 2 years at [facility] ending 5 years ago. Stable and dc from followup. Does not remember which side    Reports insidious onset of DOE x 1year. Progressive gradually. Improved by rest. Notices when he mows yard, walking briskly one flight of stairs notices dyspnea. Always improved by rest. Wife notices he is not able to keep up with her during walks outside the home. No associated weight gain, or admissions to hospitals, no new medical problems diagnosed since onset of symptoms. Denies associated chest pain, cough, fever, weight gain, hemoptysis, edema, orthopnea, paroxysmal nocturnal dyspnea, wheezing. Systolic function was moderately to severely reduced.     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 Has active GERD. Denies collagen vascular disease. No steel dust, metal dust or asbestos exposure. Denies amio intake. Denies chemo. Does not have pet birds or feathery pillows     ECHO - 05/31/11: ejection fraction was in the range of 30% to 35%. There is hypokinesis of the inferior and inferoseptal myocardium. Doppler parameters are consistent with abnormal left ventricular relaxation (grade 1 diastolic  dysfunction). Ventricular septum: These changes are consistent with a left bundle branch block. Mitral valve: Mildly calcified annulus. Mildly thickened leaflets. Pulmonary arteries: Systolic pressure was mildly increased. PA peak pressure: 47m Hg.     CT CHEST 05/31/2011 Findings: No enlarged axillary or supraclavicular lymph nodes. There is no enlarged mediastinal or hilar lymph nodes. No pericardial or pleural effusion identified. There is a patulous and fluid-filled thoracic esophagus. There is peripheral and basilar predominant interstitial reticulation. Bibasilar subpleural honeycombing is also identified. Mild bronchiectasis is present in the lung bases. Ground-glass attenuation nodular density is noted within the left upper lobe measuring 1.8 cm, image 29. Solid nodule within left lower lobe measures 6 mm. Review of the visualized osseous structures is unremarkable. Limited imaging through the upper abdomen is significant for a left renal cyst. Only partially visualized and incompletely characterized without IV contrast. IMPRESSION: 1. 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 2. Ground-glass attenuating nodule within the left upper lobe is nonspecific and may be related to interstitial lung disease. Low grade pulmonary adenocarcinoma may have a similar appearance. Follow-up imaging in 3 months is recommended to ensure stability.      PFT 06/06/11:Signs of restrictions c/w the ILD we see on CT present even though TLC 5L/81% is normal. This is because FVC 2.9L/69% and reduced (fev1 is 2.45L/86% and normal). ratip is 84 and normal. DLCO is reduced 11/50%  #lung nodule  - please sign release  for [facility] to send CD rom of CT chest  - likely not cancer but if we cannot get [place] records we have to follow this iwht CT in 9 months  #Pulmonary fibrosis  - do not know why but acid reflux could have played a role  - please Serum: ESR, ANA, DS-DNA, RF,  anti-CCP, ssA, ssB, scl-70, ANCA, Total CK, Aldolase, Hypersensitivity Pneumonitis Panel, ACE level  - will call you with results to decide next step  #Shortness of breath  - both due to heart and lung issues  - between the two the heart is more important priority - please see cardiology ASAP  - our Nurse will ensure you have an appointment next few days    OV 07/19/2011:    Review dyspnea related to ILD NOS and new chronic systolic CHF. Since last visit has seen cardiology Dr Acie Fredrickson. States that with cards med mgmt: dyspnea improved a "little bit". Able to do more. Can now climb 2 flights of stairs non-stop (last visit only 1 flight of stairs). Saw Dr Acie Fredrickson yesterday 07/18/11: - NICM suspected- 07/11/11: Abnormal stress nuclear study: Dilated left ventricle with global hypokinesis. Suspect small mild area of reversible ischemia in mid anterior wall. The degree of severe LV dysfunction is out of proportion to the mild ischemia noted. LV Ejection Fraction: 29%. LV Wall Motion: Severe global hypokinesis. Meds adjusted. Cath on hold. Plan to place pacer if no improvement in EF with med mgmt. In temrs of workup for pulmonary fibrosis:     LABS 06/20/11 auto-immune test result: all negative including HP pa  nel except anca is trace positive at 1:20 which is the cut off between negative and positive. ESR 29 #lung nodule  - there are 2 spots in lung and there is possibiloty this could be lung cancer  - we absolultey need the scan fom [facility]; not just report but the CD rom of CT chest  - see if you can get it to [place] next 2 weeks if not repeat ct chest by Aug 28, 2011 (3rd months CT)  #Pulmonary fibrosis  - do not know why but acid reflux could have played a role  - at this point we will follow this - you need breathing test in 6 months or so .  - we will also use the CT chest results for the nodule to follow the pulmonary fibrosis  #Shortness of breath  - both due to heart and lung  issues  - between the two the heart is more important priority - Follow Dr Acie Fredrickson advise  - I will check with your cardiologist about suitability to attend [facility] re[facility]    OV 09/10/2011    Followup pulmonary nodule, ILD non-UIP pattern NOS and dyspnea due to ILD and Systolic CHF: Last visit was 2 months ago. In terms of nodule, we have been unable to get the actual cd rom from [facility]. So, he will need fu testing here. In terms of pulm fibrosis, Bx Plans placed on hold due to cardiac status at time of last ov. I learn from 08/01/11 Dr Acie Fredrickson cards notes that plan is for continued medical mgmt. His dyspnea is improved a lot due to great med mgmt and bnp 80s as of 08/01/11. He is able to mow yard now. He thinks dietary portion control and resultant weight loss has also helped with dyspnea. #lung nodule  - there are 2 spots in lung and there is possibiloty this could be lung  cancer  - because we could not get scan from [facility], please do CT chest wihtout contrast now  #Pulmonary fibrosis  - do not know why but acid reflux could have played a role  - repeat anca blood test because this was trace positive and will help [place] know if this played a role  - depending on findings on CT chest, might have to consider lung biopsy  - you need breathing test in 6 months or so .  #Shortness of breath  - both due to heart and lung issues; glad you are better  - between the two- the heart is more important priority- Follow Dr Acie Fredrickson advise  - Dr Acie Fredrickson gave okay and will refer you to pulmonary rehab  #Overweight  - follow [facility] Diet sheet I discussed with you; stick to foods in left lane    Phne call 09/13/11:    ANCA blood test repeat result negative    CT scan: - lung nodule report - nodule smaller than before; & scarring or fibrosis in lung - stable as before.   OV 01/09/2012    Dyspnea: Did 12 visits with rehab that helped immensely with dyspnea. Then $ issues. Walking at home 50mles  non-stop at home with wife with o2 holding. PFT 01/09/12 - fvc 3.11/74%, fev1 2.6L/93% ratipo 84, TLC 73%, DLCO 11/53%. Of note, the FVC is significantly better than feb 2013 (? Weight loss effect) but dlco is same. In terms of weight: lost 9# on [facility] low glycemic diet (6# since [facility] 2013) . Tolerating diet well. No headaches. No hunger pangs. GERD resolved. Soft stools ++. Overall this makes him feel better despite fact echo 12/19/11 is unchangd with LOW Systolic EF 392%and elevated PASP 32m Hg -- EF conitnues to be low and he has been referred for pacemaker. #weight  - continue low glycemic diet  - as you continue to lose weight, talk to pcp JaCathlean CowerMD about slowly coming off diabetic drugs  #lung nodule  - next ct chest is May 2014  #Lung fibrosis and shortness of breath  - glad shortness of breath better  - contnue exercise and diet  - glad you had flu shot  - will hold off lung biopsy conversation due to risk of bx with EF 35%; atleast till EP eval complete or there is evidence of progression on PFT    OV 09/17/2012:      Had pacer October 2013: and since then dyspnea significantly better/ Also had ECHO 07/28/12: shows EF improved to 55% and shows some LVH. After above, not having dyspnea for yard work. Able to do all yard work without sitting. Before was sitting 2-3 times. Currently no dyspnea or cough. Feels good.    PFT 01/09/12 - fvc 3.11/74%, fev1 2.6L/93% ratipo 84, TLC 73%, DLCO 11/53%    PFT 09/16/2012. FVC 2.9 L/ 70%. FEV1 2.5 L/93%. Ratio is 86/118%. Total lung capacity is 4.4 L/70%. DLCO is reduced at 40.7/54%. Essentially shows restriction with low diffusion    EKG Jan 2014: Qtc > 500 msec    CT chest 08/26/2012: My impression is UIP> IMPRESSION: No change in subpleural reticular and cystic changes as above, which may be seen with usual interstitial pneumonitis, idiopathic pulmonary fibrosis, or hypersensitivity pneumonitis.  Stable 6 mm left lower lobe pulmonary  nodule, no persistent nodularity to the previously seen subpleural area of left upper lobe ground-glass airspace opacity.     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 Age > 7021  with subpleural, bibasal densities and in my opinion basal honey combing as well. Autoimmune is negative. Dyspnea resolved/improved significantly after pacer. I explained the disease and diagnosis to him. Explained, that high odds of IPF and diseaes is progressive, and ultimatly fatal and could be debilitating. Recommended that we screen him for pirfenidone via [employer] He is in agreement. He seems to meet criteria for the EAP program except Jan 2014 EKG showed long Qtc. Patient is interested.    ==>Ultimately he was not a candidate for Pirfenidone due to his cardiac problems.        ~  November 30, 2014:  Pulmonary consult w/ SN>        Terry Peck was recently [facility] 7/27- 11/18/14 by [facility] due to severe anemia; he states he developed recurrent SOB in mid-July and eval by PCP showed Hg=6; prior to this he was mowing his yard & stable; he denied any bleeding or change in stools; hosp eval revealed low MCV, Fe=10 (2%sat), Ferritin=4 (this suggests long slow ooze), B12=174, Folate=32; stool check in hosp was said to be NEG for blood...  He was supposed to be on Iron supplement but had stopped this about 33yrbefore;  He is followed for GI by DrJacobs- had EGD 09/06/14 showing 3-4cmHH & Barrett's epithelium (Bx= no dysplasia or malignancy); he has been taking Prilosec20Bid regularly;  Last colonoscopy was 07/18/11 showing 2 sessile polyps- one hyperplastic, one tub adenoma & f/u planned 59yr.. He was disch on FeSO4 Tid => Breathing is OK at rest, DOE w/ walking/ mowing=> now improved since transfusion; he denies cough, sputum/ hemoptysis; he denies CP/ palpit/ edema;  Also needs oral B12 supplement daily w/ f/u B12 level per protocol...       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      Current Meds> ASA81, Coreg25Bid, Lasix40, Atorva40, Metform850Tid, Actos45, Lantus44, Omep20Bid... EXAM reveals Afeb, VSS, O2sat=93% on RA at rest;  HEENT- neg, mallampati2;  Chest- bibasilar rales ~1/2 way up, no wheezing or rhonchi;  Heart- RR gr 1/6 SEM w/o r/g;  Abd soft, neg;  Ext- neg w/o c/c/e...  2DEcho 10/2014>  modLVH w/ incr wall thickness, EF=50-55% & no regional wall motion abn, AoV mod calcif & thickened w. Mild AS, MV is wnl; RV function normal, PAsys~3439m...  CXR 11/17/14 showed norm heart size, AICD on left, bibasilar fibrosis vs atx sl incr from prev...  Spirometry 11/30/14 showed FVC=2.24 (54%), FEV1=1.97 (62%), %1sec=88, mid-flows are wnl at 125% predicted... (Vol & flow are both reduced from prev).  Ambulatory oxygen saturation test 11/30/14> O2sat=94% on RA at rest w/ pulse=60/min; he ambulated 2 laps & stopped w/ dyspnea; lowest O2sat=87% w/ pulse of 80/min...   CT Chest 12/01/14 showed LUL ground-glass nodule is less prominent, LLL 6mm64mdule is unchanged, chronic ILD which appears slightly progressed in the periphery of both lungs; heart size is normal, extensive coronary artery calcification & AICD in place...  LABS 11/30/14 showed Hg=9.9 I reviewed [facility], [facility], and Labs> and discussed w/ pt & wife face-to-face during our 60mi79mpt... IMP/PLAN>>  Terry Peck's recent incr in SOB was clearly due to his severe anemia which in turn was likely related  to a slow ooze from his HH/ Barrett's mucosa and the fact that he stopped his oral iron therapy ~74yrago;  He is feeling sl better after the 2uTx & oral iron Rx w/ Hg up tp ~10 now;  He is asked to stay on the Fe Bid & B12 10028m/d... Having noted this & the recent  Cards f/u indicating stable status- his 2y55yru CT Chest and spirometry does indicate some progression of his ILD... I am in favor of considering some treatment for his pulm fibrosis- he was deemed not a candidate for pirfenedone due to his cardiac problems & AICD;  I outlined 3 options to the pt & his wife>  1) Conservative approach- continue observation to see how his dypnea improves w/ Fe & an exercise program;  2) Middle-of-the-road approach- perhaps considering an ICS like Pulmicort;  3) Aggressive approach- revisit eligibility for Pirfenedone vs trial of oral Pred... They clearly lean in the conservative direction & Terry Peck not want additional meds at this time; he wants to wait until his Hg has ret to normal & to see how his exercise program progresses... We plan ROV in 3 mo w/ CXR.  ~  March 02, 2015:  25mo97mo w/ f/u CXR> as outlined above Terry Peck been taking the FeSO4 Tid and B12 supplement regularly; he says he feels considerably better & claims his breathing is improved, overall doing well- notes DOE w/ exertion (eg- digging a bif hole to transplant schrubs), states he mows ok, walks ok, stairs ok, etc; he has O2 concentrator at home & hasn't needed to use it he says- it's a $23 copay per month & he wants to get rid of it if poss;(we discussed checking an ONO first)... He saw CARDS- DrNahser 02/17/15 for f/u HBP, SysCHF w/ EF=30-35%, LBBB, Bi-V ICD in place, HL, DM> Myoview had EF=29%, global LV dil, no ischemia; meds adjusted- on Coreg25Bid, Lasix40, ASA81; f/u LVF normalized on the Bi-V pacer=> this is checked by DrAllred for EP...     EXAM reveals Afeb, VSS, O2sat=94% on RA at rest;  HEENT- neg, mallampati2;  Chest- bibasilar rales ~1/2 way up, no wheezing or rhonchi;  Heart- RR gr 1/6 SEM w/o r/g;  Abd soft, neg;  Ext- neg w/o c/c/e...  CXR 03/02/15>  Mild cardiomeg, pacer stable, prominent interstitial markings/fibrosis- similar to prior, no change...  LABS 03/02/15>  CBC- improved w/  Hg=15, Fe=262 (62%sat), Ferritin=16;  B12 >1500... REC to decr Fe to one daily & decr B12 to 1/2 tab daily...  IMP/PLAN>>  DaviDonelleimproved, asked to decr the Fe & B12 as above; we will check ONO to assess need for continued Home oxygen; ROV planned 97mo.39mo ~  October 18, 2015:  7-83mo R81mo I note that Terry Peck had the ONO done as requested after his last visit; His PCP- DrJohn had [facility] pick it up & pt feels that he is doing satis w/o the oxygen- denies cough, sput, SOB, CP, edema, etc; he notes no deterioration in his breathing- DOE w/ exertion as before; NOTE> he has a pulse-ox monitor & says that his O2 drops w/ exercise, getting as low as 85% on RA w/ mod exertion, "but it returns to normal quickly after rest;  He says 94-95% sat at rest on RA, and 88-89% w/ mowing/ walking;  He again denies CP/ palpit/ dizzy, edema & followed by DrNahser & DrAllred as noted (they have asked him to lim his sodium intake)...Marland KitchenMarland Kitchen  EXAM reveals Afeb, VSS, O2sat=94% on RA at rest;  HEENT- neg, mallampati2;  Chest- bibasilar rales ~1/2 way up, no wheezing or rhonchi;  Heart- RR gr 1/6 SEM w/o r/g;  Abd soft, neg;  Ext- neg w/o c/c/e...  Ambulatory Oximetry 10/18/15>  O2sat=94% on RA at rest w/ pulse=66/min;  He ambulated 2 laps in office (185' ea) w/ lowest O2sat=86% w/ pulse=83/min...   Overnight Oximetry> ordered again, we were never notified of the results, finally contacted [facility] & told he desaturated to <88% on RA for 183mn of the overnight study=> restart O2 at 2L/min Qhs... IMP/PLAN>>  Pt is resistent to the idea of home oxygen but his studies indicate need for NOCTURNAL O2 at 2L/min AND ambulatory O2 w/ exertion; he will capitulate to the former but declines O2 w/ exercise believing that he is stable & doing satis w/ quick recovery after exercise...   ~  February 16, 2016:  41moOV & pulmonary follow up visit> DaGaeleturns and reports feeling well- no new complaints or concerns; he is using his O2 at 2L/min  flow Qhs, but doesn't think he needs it during the day w/ usual activities; he says that his breathing is good, denies cough/ sput/ hemoptysis/ ch in DOE/ CP/ palpit/ dizzy/ ede4m77m.     He saw PCP-DrJohn 11/09/15> doing satis, Labs were ok w/ BS=133, but A1c=8.6 and his Lantus was increased; on Lantus40, Metform850Tid, Actos45    He had [facility] f/u DrNahser 02/13/16> HBP, LBBB, SysCHF w/ EF=30-35% that normalized w/ Bi-V pacer, HL-- last 2DEcho 7/16 showed EF=50-55%; Pt stopped his Lip40 believing that this caused his Glucose to go up; he has since restarted Atorva20 & will check w/ DrJohn for f/u FLP...     He saw EP clinic, DrAllred & NP 02/02/16> reported doing well, norm ICD function confirmed,  EXAM reveals Afeb, VSS, O2sat=95% on RA at rest;  HEENT- neg, mallampati2;  Chest- bibasilar rales ~1/2 way up, no wheezing or rhonchi;  Heart- RR gr 1/6 SEM w/o r/g;  Abd soft, neg;  Ext- neg w/o c/c/e...  CXR 02/16/16>  Norm heart size, ICD well positioned on the left, bilat interstitial thickening is stable, unchanged, NAD... IMP/PLAN>>  DavDavian stoic but stable, reports breathing satis w/o recent exac etc..recommended to continue same, stay active, rov in 82mo73moner if needed prn...  ~  August 16, 2016:  82mo 39mo& Terry Peck w/o complaints, feeling well, & denies any problems currently;  He says his breathing is good & he denies cough, sput, hemoptysis or SOB- he does admit to DOE "with heavy exertion" (he push mows the yard & stops 1/2 way thru to rest, then proceeds);  He does have some NOCTURNAL & exercise induced hypoxemia- he wears O2 at 2L/min Qhs but says he doesn't need it during the day... we reviewed the following medical problems during today's office visit >>     Hx dyspnea, ILD- pulm fibrosis, restrictive lung physiology>  [facility] pt decided against pirfenidone & he didn't qualify for the study due to his CHF; he was followed by his PCP until referral  11/2014=> see above- he chose conservative approach & did not want additional meds; his ILD has been slowly progressive but he has remained stoic & denies symptoms of cough or dyspnea, but breathing did improve w/ treatment of his anemia (Fe-Tid & oral B12 supplement), and w/ Pulm Rehab x several months in 2017 but too $$ he said...Marland KitchenMarland Kitchen  Nocturnal hypoxemia and exercise induced hypoxemia> as noted he uses nocturnal O2 at 2L/min, but only uses exercise O2 when he feels he needs it & he checks w/ home oximeter; Note: PAsys ~34 on last 2DEcho (7/16).    Hx small lung nodules on CT Chest> this was found & followed at the [place]; he has had several CT Chest scans here- last 11/2014 & summary shows sl progressive peripheral interstitial dis over time, underlying paraseptal emphysema & bilat LL bronchiectasis, no adenopathy, stable 51m LLL nodule & an area in LUL w/ GGO that appears less prominent serially; incidental findings included atherosclerotic ao, coronary calcif, gallstones, renal cyst...    Ex-smoker> min smoking hx, only smoked for 10 yrs, and quit 1990.    HBP>  Controlled on meds- ASA81, Coreg25Bid & Lasix40/d;  BP= 120/80 and he denies CP, palpit, SOB, dizzy, or edema...    Cardiomyopathy, chr systolic CHF, LBBB, BiV pacer> followed by DrNahser & DrAllred, SysCHF w/ EF=30-35% that normalized w/ Bi-V pacer- last 2DEcho 7/16 showed EF=50-55%...    IDDM> [facility] now followed by PCP-DrJJohn on Lantus40u, Metform850Tid, Actos45; LABS 10/2015 showed FBS=133 & A1c=8.6;  f/u LABS today showed BS=137, A1c=6.7 => he is rec to STOP the Actos & continue the Lantus40 & Metform Tid + low carb diet...     HL> on Atorva20 & Omega-3 fish oil; last FLP 06/2016 showed TChol 104, TG 100, HDL 42, LDL 42-- rec to continue same meds + diet....    HH/ Barrett's esoph, colon polyp> followed for GI by DrJacobs- had EGD 09/06/14 showing 3-4cmHH & Barrett's epithelium (Bx= no dysplasia or malignancy)- he has been  taking Prilosec20Bid regularly; Last colonoscopy was 07/18/11 showing 2 sessile polyps- one hyperplastic, one tub adenoma & f/u planned 537yr..    DJD> aware, on Advil, Robaxin, Tylenol#3 as needed...    [facility] & Hg=6, iron deficient (Ferritin=4) & low B12 (174)-- presumed slow ooze from his large HH, no direct source found;  Hg & Fe ret to normal on oral supplement, same for B12;  Currently taking FeSO4- 32534m, and B12- 1000m61m- continue same. EXAM reveals Afeb, VSS, Wt down 7# to 183#; O2sat=93% on RA at rest;  HEENT- neg, mallampati2;  Chest- bibasilar rales ~1/2 way up, no wheezing or rhonchi;  Heart- RR gr 1/6 SEM w/o r/g;  Abd soft, neg;  Ext- neg w/o c/c/e...  Ambulatory Oximetry 08/16/16>  O2sat=90% on RA at rest w/ pulse=75/min;  He ambulated 2 Laps in the office (185'ea) & lowest O2sat=83% w/ pulse=92/min;  He is rec to use O2 w/ all activities and ambulation...   LABS 08/16/16>  Chems- ok w BS=137, A1c=6.7, Cr=0.99, BNP=39;  CBC- wnl w/ Hg=15.5, mcv=95;  Fe=87 (19%sat), Ferritin=18.7 (22-322)... IMP/PLAN>>  DaviZymerey73 & has slowly progressive IPF along w/ NICM, IDDM, and hx IDA;  He has twice declined consideration of anti-fibrotic therapy, and he is quite stoic- continuing to deny syptoms in the face of slowly progressive fibrosis on CXR/scans, and hypoxemia w/ exercise;  He is rec to use the O2 w/ all activities and qhs (ok to take it off at rest & he monitors O2sats w/ his home monitor (all>90%);  We will proceed w/ a f/u 2DEcho to recheck EF & PAsys;  DM is improved w/ A1c=6.7 & he is rec to STOP the Actos in light of his cardiac dis;  Finally his Hg is 15 on FeSO4 one daily- rec  to continue w/ his Ferritin level...  ADDENDUM>>  2DEcho 09/07/16>>  Severe focal basilar hypertrophy & mild concentric hypertrophy, norm LVF w/ EF=60-65% w/ norm wall motion; Gr1DD; AoV mildly calcif & no AS, no AI; MV- norm; Atria/ RV/ PA- all wnl...  ~  February 18, 2017:  29mo ROV & DJoseantonioreports feeling great, denies problems and has no new complaints or concerns;  He is wearing his O2 at 2L/min Qhs and prn days (eg- with exertion);  He says he is able to get a good deep breath & "I Terry Peck't ever feel out of breath";  He denies cough, sput, hemoptysis; there is no chest congestion, no CP/ palpit/ edema, and his DOE is stable/ unchanged... We reviewed the following interval Epic notes since last visit>      He saw CARDS- DrNahser on 08/17/16>  Hx HBP, LBBB, NICM w/ chr sys CHF w/ EF 30-35%=> improved to 55%, Bi-V ICD placed, HL;  On ASA81, Coreg25Bid, Lasix40, Lipitor20;  Doing well w/o symptoms, no edema;  They added Losartan50 & rec that he stop his Actos...     He had f/u w/ PCP- DrJohn on 11/23/16>  Wellness visit, doing satis, note reviewed, they decided to decr his Actos to 380md (I had prev directed the pt to STOP the Actos 07/2016).    He continues to have his ICD monitored remotely every month per DrAllred>  Most recent report 01/29/17- pacing >99%, thoracic impedance is normal, no changes made...  We reviewed the following medical problems during today's office visit>     Hx dyspnea, ILD- pulm fibrosis, restrictive lung physiology>  [facility] pt decided against pirfenidone & he didn't qualify for the study due to his CHF; he was followed by his PCP until referral 11/2014=> see above- he chose conservative approach & did not want additional meds; his ILD has been slowly progressive but he has remained stoic & denies symptoms of cough or dyspnea, but breathing did improve w/ treatment of his anemia (Fe-Tid & oral B12 supplement), and w/ Pulm Rehab x several months in 2017 but too $$ he said; he says he is walking some but needs to do more.    Nocturnal hypoxemia and exercise induced hypoxemia> as noted he uses nocturnal O2 at 2L/min, but only uses exercise O2 when he feels he needs it & he checks w/ home oximeter; Note: PAsys ~34 on last 2DEcho  (7/16).    Hx small lung nodules on CT Chest> [place] last 11/2014 & summary shows sl progressive peripheral interstitial dis over time, underlying paraseptal emphysema & bilat LL bronchiectasis, no adenopathy, stable 92m14mLL nodule & an area in LUL w/ GGO that appears less prominent serially; incidental findings included atherosclerotic ao, coronary calcif, gallstones, renal cyst...    Ex-smoker> min smoking hx, only smoked for 10 yrs, and quit 1990.    HBP>  Controlled on meds- ASA81, Coreg25Bid, Losartan50, & Lasix40/d;  BP= 142/76 and he denies CP, palpit, SOB, dizzy, or edema...    Cardiomyopathy, chr systolic CHF, LBBB, BiV pacer> followed by DrNahser & DrAllred, SysCHF w/ EF=30-35% that normalized w/ Bi-V pacer- last 2DEcho 7/16 showed EF=50-55%; monthly thoracic impedance checks OK.    IDDM> [facility] now followed by PCP-DrJJohn on Lantus40u, Metform850Tid, Actos30; LABS 07/2016 showed BS=137, A1c=6.7 => I directed him to stop the Actos 4/18  but DrJohn preferred to decr to 21m.    HL> on Atorva20 & Omega-3 fish oil; last FLP 06/2016 showed TChol 104, TG 100, HDL 42, LDL 42-- rec to continue same meds + diet....    HH/ Barrett's esoph, colon polyp> followed for GI by DrJacobs- had EGD 09/06/14 showing 3-4cmHH & Barrett's epithelium (Bx= no dysplasia or malignancy)- he has been taking Prilosec20Bid regularly; Last colonoscopy was 07/18/11 showing 2 sessile polyps- one hyperplastic, one tub adenoma & f/u planned 552yr..    DJD> aware, on Advil, Robaxin, Tylenol#3 as needed...    [facility] & Hg=6, iron deficient (Ferritin=4) & low B12 (174)-- presumed slow ooze from his large HH, no direct source found;  Hg & Fe ret to normal on oral supplement, same for B12;  Currently taking FeSO4- 32542m, and B12- 1000m107m- continue same. EXAM reveals Afeb, VSS, Wt 191#; O2sat=85% on RA on  arrival, 92% on 2L/min;  HEENT- neg, mallampati2;  Chest- bibasilar rales ~1/2 way up, no wheezing or rhonchi;  Heart- RR gr 1/6 SEM w/o r/g;  Abd soft, neg;  Ext- neg w/o c/c/e...  CXR 02/18/17 (independently reviewed by me in the PACS system) shows stable heart size & vascularity, left sided AICD, similar bilat reticulonodular interstitial opacities- stable, NAD...  IMP/PLAN>>  DaviYaroslavstable w/ his heart & lung problems- Again rec to increase his exercise program & use his O2 2L/min Qhs and w/ activity  ~  August 19, 2017:  622mo 15mo& Zoran returns c/o some incr SOB w/ activity; he has been using his Home O2 at 2-3L/min regularly (with activity during the day & Qhs);  He denies much cough, denies sputum/ hemoptysis, and the sl progressive DOE- says he's ok sitting at rest w/ O2sats in the 90's but drops w/ any activity now;  He denies CP/ palpit/ edema... He requests Handicap sticker & this was provided today... We reviewed the following interval Epic notes since last visit>     He saw CARDS- DrNahser on 03/04/17>  HBP, CHF (EF=30-35%=>55%), LBBB, Bi-V pacer & ICD, HL; note reviewed, EF has normalized to 60-65% w/ G1DD, no changes made to his med regimen...     He saw PCP- DrJohn on 05/28/17>  Wellness visit & f/u, note reviewed, overall doing satis, Lantus was decr to 35u daily, follow up planned in 622mo..68mo  Kamin Angusnues to have his ICM monitored Qmo> Last 08/12/17 & note reviewed, recent Cr=0.82, stable impedance monitoring, no changes made... We reviewed the following medical problems during today's office visit>     Hx dyspnea, Abn CT Chest w/ ILD-pulm fibrosis/ paraseptal emphysema/ bilat LL bronchiectasis, and scat pulm nodules, restrictive physiology, hypoxemia on home O2> on Home O2 at 2-3L/min, not currently on inhalers, he reports slowly progressing dyspnea & has been using his O2 more regularly. Yet denies cough/ sputum/ edema, notes DOE w/ walking & chores but ADL's are still ok...      CARDIAC>  HBP, cardiomyopathy/ chronic sys CHF, LBBB, Bi-V pacer/ ICD> followed by DrNahser & felt to be stable on ASA81, Coreg25Bid, Losar50, Lasix40; ICM monitoring monthly has been stable...    Medical>  HBP, IDDM, HL, HH/ Barrett's/ colon polyps, DJD, hx severe anemia>  followed by DrJohn, on above + Atorva20, Metform850Tid, Actos30, Lantus35, Prilosec20, Fe/ B12/ VitD... EXAM reveals Afeb, VSS, Wt down 8# to 183#; O2sat=94% on 3L/min;  HEENT- neg, mallampati2;  Chest- bibasilar rales ~1/2 way up, no wheezing or  rhonchi;  Heart- RR gr 1/6 SEM w/o r/g;  Abd obese, soft, neg;  Ext- neg w/o c/c/e...  LABS in Epic 05/2017>  FLP- all parameters at goals;  Chems- ok w/ K=4.7, BS=79, Cr=0.82, LFTs wnl;  CBC- ok w/ Hg=15.7, WBC=7.4;  A1c=6.7;  TSH=2.93;  PSA=0.29 IMP/PLAN>>  Kyvon is now 29 w/ pulm fibrosis that is stable roentgenographically but slowly progressive physiologically w/ hypoxemia on Home O2; he has so-far been able to maintain his life-style and activity level/ exercise;  Cardiology stable per DrNahser and Medically managed by DrJohn... Continue current med & we plan ROV in 2mo..   ~  February 18, 2018:  637moOV & Marek reports "doing pretty good" stating that he feels well, denies SOB at rest or light activity, +DOE w/ exertion but he notes "there's a lot I can do now";  Denies cough or sput, no hemoptysis, and denies CP/ palpit/ edema; denies chest congestion, wheezing, or recent resp infection;  He was prev on Flovent but stopped on his own due to sore throat & didn't notify usK[place]or substitute med...  We reviewed the following interval Epic notes since last visit>     He saw PCP- DrJohn on 11/26/17>  HBP, CHF, HL, DM on insulin, GI w/ HH/ Barretts/ colon polyp, DJD, hx anemia; meds- reviewed on Metform500-4/d, Actos30, Lantus insulin 50u & last A1c=7.6; FLP looked good...    He saw CARDS- DrNahser on 12/16/17>  HBP, CHF w. Bi-V pacer/ ICD, HL;  Notes reviewed, doing satis; on ASA81, Coreg25Bid,  Losar50, Lasix40, Atorva20; no changes made...    He saw CARDS- DrAllred on 02/05/18>  EP follow up doing satis (stoic) & denies CP, palpit, dizzy, edema, dyspnea; he has HBP, cardiomyop, chr sys CHF, LBBB, Bi-V pacer/ICD; EF normalized in f/u Echo 2018; followed in the ICM monitoring clinic monthly & their reports are reviewed...  We reviewed the following medical problems during today's office visit>     Hx dyspnea, Abn CT Chest w/ ILD-pulm fibrosis/ paraseptal emphysema/ bilat LL bronchiectasis, and scat pulm nodules, restrictive physiology, hypoxemia (on home O2)> on Home O2 at 2-3L/min, not currently on inhalers, he reports slowly progressing dyspnea & has been using his O2 more regularly. Yet denies cough/ sputum/ edema, notes DOE w/ walking & chores but ADL's are still ok...     CARDIAC>  HBP, cardiomyopathy/ chronic sys CHF, LBBB, Bi-V pacer/ ICD> followed by DrNahser & Allred- felt to be stable on ASA81, Coreg25Bid, Losar50, Lasix40; ICM monitoring monthly has been stable...    Medical>  HBP, IDDM, HL, HH/ Barrett's/ colon polyps, DJD, hx severe anemia>  followed by DrJohn, on above + Atorva20, Metform500-4/d, Actos30, Lantus50, Prilosec20, Fe/ B12/ VitD... EXAM reveals Afeb, VSS, Wt down 10# to 173#; O2sat=90% on RA;  HEENT- neg, mallampati2;  Chest- bibasilar rales ~1/2 way up, no wheezing or rhonchi;  Heart- RR gr 1/6 SEM w/o r/g;  Abd obese, soft, neg;  Ext- neg w/o c/c/e...  CXR 02/18/18>  decr lung volumes similar to prev films, diffuse incr marking c/w fibrosis, no acute changes; AICD in good position... IMP/PLAN>>  Terry Peck as though he's holding his own & doesn't want to make any changes; not using the Flovent inhaler & given samples of PULMICORT- 2 inhalations Bid, discussed exercise w/ his O2 to build up his stamina; continue all other meds regularly & f/u w/ Cards and PCP... we discussed my up-coming retirement & we will arrange for continue Pulm follow up w/ DrIcard...Marland KitchenMarland Kitchen  Past  Medical History:  Diagnosis Date  . Abnormal ANCA test 07/03/2011  . Allergy   . Arthritis    "mild; right shoulder" (01/29/2012)  . B12 deficiency anemia   . Barrett's esophagus 11/23/2010  . Bundle branch block, left   . Cataract    L eye surgery  . CHF (congestive heart failure) ([place])    pacemaker  . Chronic systolic dysfunction of left ventricle    EF 30%  . Colon polyps 11/23/2010  . GERD (gastroesophageal reflux disease)   . Hyperlipidemia 10/14/2012  . Hypertension   . ICD (implantable cardiac defibrillator) in place   . Iron deficiency anemia 05/30/2011  . Liver abscess 1980's  . Nonischemic cardiomyopathy ([place])    s/p St. Jude BiV ICD 01/29/12  . Pulmonary fibrosis ([place])   . Pulmonary nodules   . Shortness of breath    "related to heart being out of rhythm" (01/29/2012)  . Type II diabetes mellitus ([place])     Past Surgical History:  Procedure Laterality Date  . abscess drained  ?1980's   liver  . BI-VENTRICULAR IMPLANTABLE CARDIOVERTER DEFIBRILLATOR N/A 01/29/2012   Procedure: BI-VENTRICULAR IMPLANTABLE CARDIOVERTER DEFIBRILLATOR  (CRT-D);  Surgeon: Thompson Grayer, MD;  Location: [facility] CATH LAB;  Service: Cardiovascular;  Laterality: N/A;  . CARDIAC DEFIBRILLATOR PLACEMENT  01/29/2012   SJM Quadra Assura BiV ICD implanted by Dr Rayann Heman  . CATARACT EXTRACTION Left   . COLONOSCOPY    . TONSILLECTOMY  1952  . UPPER GI ENDOSCOPY      Outpatient Encounter Medications as of 02/18/2018  Medication Sig  . acetaminophen-codeine (TYLENOL #3) 300-30 MG tablet Take 1 tablet by mouth every 8 (eight) hours as needed for moderate pain.  Marland Kitchen aspirin EC 81 MG tablet Take 81 mg by mouth daily.  Marland Kitchen atorvastatin (LIPITOR) 20 MG tablet TAKE 1 TABLET BY MOUTH ONCE DAILY  . Calcium-Magnesium-Zinc 1000-400-15 MG TABS Take 1 tablet by mouth daily.   . carvedilol (COREG) 25 MG tablet TAKE 1 TABLET BY MOUTH TWICE DAILY WITH MEALS  . Cholecalciferol (VITAMIN D) 1000 UNITS capsule Take 1,000 Units by mouth  daily.   . Cyanocobalamin (B-12 PO) Take 1 tablet by mouth daily.   . furosemide (LASIX) 40 MG tablet Take 1 tablet (40 mg total) by mouth daily.  Marland Kitchen ibuprofen (ADVIL,MOTRIN) 200 MG tablet Take 200-300 mg by mouth 2 (two) times daily as needed. For pain  . Insulin Glargine (LANTUS SOLOSTAR) 100 UNIT/ML Solostar Pen INJECT 50 UNITS SUBCUTANEOUSLY ONCE DAILY  . Insulin Pen Needle (RELION PEN NEEDLES) 32G X 4 MM MISC Use as directed once daily E11.9  . Lutein 20 MG CAPS Take 20 mg by mouth daily.  . metFORMIN (GLUCOPHAGE-XR) 500 MG 24 hr tablet Take 4 tablets (2,000 mg total) by mouth daily with breakfast.  . methocarbamol (ROBAXIN) 500 MG tablet TAKE ONE TABLET BY MOUTH ONCE DAILY AS NEEDED  . Multiple Vitamin (MULTIVITAMIN WITH MINERALS) TABS Take 1 tablet by mouth daily.  . OMEGA 3 1000 MG CAPS Take 1,000 mg by mouth 2 (two) times daily.   Marland Kitchen omeprazole (PRILOSEC) 20 MG capsule TAKE 1 CAPSULE BY MOUTH TWICE DAILY  . pioglitazone (ACTOS) 30 MG tablet TAKE 1 TABLET BY MOUTH ONCE DAILY  . budesonide (PULMICORT) 180 MCG/ACT inhaler Inhale 2 puffs into the lungs 2 (two) times daily.  Marland Kitchen losartan (COZAAR) 50 MG tablet Take 1 tablet (50 mg total) by mouth daily.  . [DISCONTINUED] Fluticasone Propionate, Inhal, (FLOVENT DISKUS) 250 MCG/BLIST AEPB  Inhale 1 puff into the lungs 2 (two) times daily.   No facility-administered encounter medications on file as of 02/18/2018.     Allergies  Allergen Reactions  . Penicillins Anaphylaxis and Other (See Comments)    "swelling of eyes; throat; could breath good" (01/29/2012)  . Iodine Other (See Comments)    "if I get iodine dye, I'll get a fever" (01/29/2012)  . Fish Oil Itching    Pt said only when given through IV    Immunization History  Administered Date(s) Administered  . Influenza Split 01/03/2012  . Influenza Whole 05/03/2011  . Influenza, High Dose Seasonal PF 01/17/2016  . Influenza,inj,Quad PF,6+ Mos 03/24/2013, 12/09/2013, 03/02/2015  .  Influenza-Unspecified 02/04/2017, 01/14/2018  . Pneumococcal Conjugate-13 10/21/2008, 04/14/2013, 02/04/2017  . Pneumococcal Polysaccharide-23 01/03/2012  . Tdap 11/23/2010  . Zoster 04/14/2013    Current Medications, Allergies, Past Medical History, Past Surgical History, Family History, and Social History were reviewed in [employer] record.   Review of Systems            All symptoms NEG except where BOLDED >>  Constitutional:  F/C/S, fatigue, anorexia, unexpected weight change. HEENT:  HA, visual changes, hearing loss, earache, nasal symptoms, sore throat, mouth sores, hoarseness. Resp:  cough, sputum, hemoptysis; SOB, tightness, wheezing. Cardio:  CP, palpit, DOE, orthopnea, edema. GI:  N/V/D/C, blood in stool; reflux, abd pain, distention, gas. GU:  dysuria, freq, urgency, hematuria, flank pain, voiding difficulty. MS:  joint pain, swelling, tenderness, decr ROM; neck pain, back pain, etc. Neuro:  HA, tremors, seizures, dizziness, syncope, weakness, numbness, gait abn. Skin:  suspicious lesions or skin rash. Heme:  adenopathy, bruising, bleeding. Psyche:  confusion, agitation, sleep disturbance, hallucinations, anxiety, depression suicidal.   Objective:   Physical Exam      Vital Signs:  Reviewed...   General:  WD, WN, 77 y/o WM in NAD; alert & oriented; pleasant & cooperative... HEENT:  [place]/AT; Conjunctiva- pink, Sclera- nonicteric, EOM-wnl, PERRLA, EACs-clear, TMs-wnl; NOSE-clear; THROAT-clear & wnl.  Neck:  Supple w/ fair ROM; no JVD; normal carotid impulses w/o bruits; no thyromegaly or nodules palpated; no lymphadenopathy.  Chest:  decr BS bilat w/ velcro rales bilat ~1/2 way up; no wheezing or rhonchi, no signs of consolidation... Heart:  AICD on left, Regular Rhythm; gr 1/6 SEM w/o rubs or gallops detected. Abdomen:  Soft & nontender- no guarding or rebound; normal bowel sounds; no organomegaly or masses palpated Ext:  decrROM; without  deformities +arthritic changes; no varicose veins, venous insuffic, or edema;  Pulses intact w/o bruits. Neuro:  No focal neuro deficits; sensory testing normal; gait normal & balance OK. Derm:  No lesions noted; no rash etc. Lymph:  No cervical, supraclavicular, axillary, or inguinal adenopathy palpated.   Assessment:      IMP >>     Multifactorial dyspnea -- fibrosis/ restriction, CHF, anemia, deconditioning all playing a roll     Pulmonary fibrosis -- very slowly progressive per prev scans, pt desires conservative approach & does not want additional meds.    Pulmonary nodules -- stable on serial CT Chest scans    NICM w/ chr sys CHF, LBBB -- per DrNasher/ Allred, stable    AICD placed -- stable    Anemia- Iron defic & low B12 -- likely slow ooze from HH/ Barrett's & absorption issues -- improved w/ FeSO4 and B12 orally- stay on PPI Bid as well...    Other medical problems as noted> HL, IDDM (watch Actos rx w/ CHF  diagnosis)  PLAN >>  11/30/14>   Audie's recent incr in SOB was clearly due to his severe anemia which in turn was likely related to a slow ooze from his HH/ Barrett's mucosa and the fact that he stopped his oral iron therapy ~52yrago;  He is feeling sl better after the 2uTx & oral iron Rx w/ Hg up tp ~10 now;  He is asked to stay on the Fe Bid & B12 10095m/d... Having noted this & the recent Cards f/u indicating stable status- his 2y90yru CT Chest and spirometry does indicate some progression of his ILD... 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 They clearly lean in the  conservative direction & Terry Peck not want additional meds at this time; he wants to wait until his Hg has ret to normal & to see how his exercise program progresses... We plan ROV in 3 mo w/ CXR. 03/02/15>   Terry Peck improved, asked to decr the Fe & B12 as above; we will check ONO to assess need for continued Home oxygen; ROV planned 45mo42mo 10/18/15>   Pt is resistent to the idea of home oxygen but his studies indicate need for NOCTURNAL O2 at 2L/min AND ambulatory O2 w/ exertion; he will capitulate to the former but declines O2 w/ exercise believing that he is stable & doing satis w/ quick recovery after exercise...  02/16/16>   Pt reports stable, no new complaints or concerns... 08/16/16>   DaviShanon Browy67 & has slowly progressive IPF along w/ NICM, IDDM, and hx IDA;  He has twice declined consideration of anti-fibrotic therapy, and he is quite stoic- continuing to deny syptoms in the face of slowly progressive fibrosis on CXR/scans, and hypoxemia w/ exercise;  He is rec to use the O2 w/ all activities and qhs (ok to take it off at rest 7 he monitors O2sats w/ his home monitor (all>90%);  We will proceed w/ a f/u 2DEcho to recheck EF & PAsys;  DM is improved w/ A1c=6.7 & he is rec to STOP the Actos in light of his cardiac dis;  Finally his Hg is 15 on FeSO4 one daily- rec to continue w/ his Ferritin level. 10.29/18>   DaviJoselitostable w/ his heart & lung problems- Again rec to increase his exercise program & use his O2 2L/min Qhs and w/ activity 08/19/17>    DaviStrykernow 77 w68pulm fibrosis that is stable roentgenographically but slowly progressive physiologically w/ hypoxemia on Home O2; he has so-far been able to maintain his life-style and activity level/ exercise;  Cardiology stable per DrNahser and Medically managed by DrJohn... Continue current med & we plan ROV in 45mo.33mo/29/19>   Terry Peck as though he's holding his own & doesn't want to make any changes; not using the Flovent inhaler & given samples  of PULMICORT- 2 inhalations Bid, discussed exercise w/ his O2 to build up his stamina; continue all other meds regularly & f/u w/ Cards and PCP... we discussed my up-coming retirement & we will arrange for continue Pulm follow up  w/ DrIcard..   Plan:     Patient's Medications  New Prescriptions   BUDESONIDE (PULMICORT) 180 MCG/ACT INHALER    Inhale 2 puffs into the lungs 2 (two) times daily.  Previous Medications   ACETAMINOPHEN-CODEINE (TYLENOL #3) 300-30 MG TABLET    Take 1 tablet by mouth every 8 (eight) hours as needed for moderate pain.   ASPIRIN EC 81 MG TABLET    Take 81 mg by mouth daily.   ATORVASTATIN (LIPITOR) 20 MG TABLET    TAKE 1 TABLET BY MOUTH ONCE DAILY   CALCIUM-MAGNESIUM-ZINC 1000-400-15 MG TABS    Take 1 tablet by mouth daily.    CARVEDILOL (COREG) 25 MG TABLET    TAKE 1 TABLET BY MOUTH TWICE DAILY WITH MEALS   CHOLECALCIFEROL (VITAMIN D) 1000 UNITS CAPSULE    Take 1,000 Units by mouth daily.    CYANOCOBALAMIN (B-12 PO)    Take 1 tablet by mouth daily.    FUROSEMIDE (LASIX) 40 MG TABLET    Take 1 tablet (40 mg total) by mouth daily.   IBUPROFEN (ADVIL,MOTRIN) 200 MG TABLET    Take 200-300 mg by mouth 2 (two) times daily as needed. For pain   INSULIN GLARGINE (LANTUS SOLOSTAR) 100 UNIT/ML SOLOSTAR PEN    INJECT 50 UNITS SUBCUTANEOUSLY ONCE DAILY   INSULIN PEN NEEDLE (RELION PEN NEEDLES) 32G X 4 MM MISC    Use as directed once daily E11.9   LOSARTAN (COZAAR) 50 MG TABLET    Take 1 tablet (50 mg total) by mouth daily.   LUTEIN 20 MG CAPS    Take 20 mg by mouth daily.   METFORMIN (GLUCOPHAGE-XR) 500 MG 24 HR TABLET    Take 4 tablets (2,000 mg total) by mouth daily with breakfast.   METHOCARBAMOL (ROBAXIN) 500 MG TABLET    TAKE ONE TABLET BY MOUTH ONCE DAILY AS NEEDED   MULTIPLE VITAMIN (MULTIVITAMIN WITH MINERALS) TABS    Take 1 tablet by mouth daily.   OMEGA 3 1000 MG CAPS    Take 1,000 mg by mouth 2 (two) times daily.    OMEPRAZOLE (PRILOSEC) 20 MG CAPSULE    TAKE 1  CAPSULE BY MOUTH TWICE DAILY   PIOGLITAZONE (ACTOS) 30 MG TABLET    TAKE 1 TABLET BY MOUTH ONCE DAILY  Modified Medications   No medications on file  Discontinued Medications   FLUTICASONE PROPIONATE, INHAL, (FLOVENT DISKUS) 250 MCG/BLIST AEPB    Inhale 1 puff into the lungs 2 (two) times daily.

## 2018-02-18 NOTE — Patient Instructions (Addendum)
Today we updated your med list in our EPIC system...    Continue your current medications the same...  We decided to try a different cortisone inhaler=> try the PULMICORT 2 inhalations twice daily...    Don't forget to brush, rinse, & spit after the treatments...  Today we did a follow up CXR...    We will contact you w/ the results when available...   Keep up the good work w/ your exercise program- the exercise Bike indoors is a great idea for the winter...  Terry Peck,  It has been my great honor to have been one of your doctors over these past few years...    Sending you my best wished for health & happiness in the years to come!!!  We will arrange for a follow up appt w/ my young partner, DrIcard in about 42months...  Do not hesitate to call for any questions or problems.Marland KitchenMarland Kitchen

## 2018-02-21 ENCOUNTER — Telehealth: Payer: Self-pay | Admitting: Pulmonary Disease

## 2018-02-21 NOTE — Progress Notes (Signed)
LMTCB

## 2018-02-21 NOTE — Telephone Encounter (Signed)
Notes recorded by Rosana Berger, CMA on 02/21/2018 at 4:48 PM EDT [facility] ------  Notes recorded by Noralee Space, MD on 02/21/2018 at 10:53 AM EDT Please notify pt> SMN CXR is similar to previous films-- diffuse increased marking c/w fibrosis with decr lung volume, AICD in left lung is stable, no acute changes... REC to use the new PULMICORT inhaler- 2 inhalations twice daily; continue other meds, stay on the Lasix40, and remember- no salt...  Called and spoke with pt letting him know the results of his cxr. Pt expressed understanding. Nothing further needed.

## 2018-03-06 ENCOUNTER — Ambulatory Visit (INDEPENDENT_AMBULATORY_CARE_PROVIDER_SITE_OTHER): Payer: Medicare Other

## 2018-03-06 DIAGNOSIS — Z95810 Presence of automatic (implantable) cardiac defibrillator: Secondary | ICD-10-CM

## 2018-03-06 DIAGNOSIS — I5022 Chronic systolic (congestive) heart failure: Secondary | ICD-10-CM

## 2018-03-06 NOTE — Progress Notes (Signed)
EPIC Encounter for ICM Monitoring  Patient Name: Terry Peck is a 77 y.o. male Date: 03/06/2018 Primary Care Physican: Biagio Borg, MD Primary Cardiologist:Nahser Electrophysiologist: Allred Bi-V Pacing: >99% Last Weight: 175lbs Today's Weight:  unknown      Attempted call to patient and unable to reach.  Left detailed message, per DPR, regarding transmission.  Transmission reviewed.    Thoracic impedance normal.   Prescribed: Furosemide 40 mg 1 tablet daily  Labs: 11/26/2017 Creatinine 0.90, BUN 23, Potassium 4.5, Sodium 142, EGFR 86.88 05/28/2017 Creatinine 0.82, BUN 18, Potassium 4.7, Sodium 141, EGFR 96.86  Recommendations: Left voice mail with ICM number and encouraged to call if experiencing any fluid symptoms.  Follow-up plan: ICM clinic phone appointment on 04/07/2018.    Copy of ICM check sent to Dr. Rayann Heman.   3 month ICM trend: 03/06/2018    1 Year ICM trend:       Rosalene Billings, RN 03/06/2018 12:35 PM

## 2018-03-07 ENCOUNTER — Telehealth: Payer: Self-pay

## 2018-03-07 NOTE — Telephone Encounter (Signed)
Remote ICM transmission received.  Attempted call to patient regarding ICM remote transmission and left detailed message, per DPR, with next ICM remote transmission date of 04/07/2018.  Advised to return call for any fluid symptoms or questions.   

## 2018-03-09 DIAGNOSIS — J84112 Idiopathic pulmonary fibrosis: Secondary | ICD-10-CM | POA: Diagnosis not present

## 2018-03-09 DIAGNOSIS — I5022 Chronic systolic (congestive) heart failure: Secondary | ICD-10-CM | POA: Diagnosis not present

## 2018-03-09 DIAGNOSIS — I110 Hypertensive heart disease with heart failure: Secondary | ICD-10-CM | POA: Diagnosis not present

## 2018-03-09 DIAGNOSIS — J8410 Pulmonary fibrosis, unspecified: Secondary | ICD-10-CM | POA: Diagnosis not present

## 2018-03-23 ENCOUNTER — Inpatient Hospital Stay (HOSPITAL_COMMUNITY): Payer: Medicare Other | Admitting: Anesthesiology

## 2018-03-23 ENCOUNTER — Inpatient Hospital Stay (HOSPITAL_COMMUNITY): Payer: Medicare Other

## 2018-03-23 ENCOUNTER — Encounter (HOSPITAL_COMMUNITY): Payer: Self-pay | Admitting: Radiology

## 2018-03-23 ENCOUNTER — Encounter (HOSPITAL_COMMUNITY)
Admission: EM | Disposition: A | Discharge status: Hospice | Payer: Self-pay | Source: Home / Self Care | Attending: Neurology

## 2018-03-23 ENCOUNTER — Inpatient Hospital Stay (HOSPITAL_COMMUNITY)
Admission: EM | Admit: 2018-03-23 | Discharge: 2018-04-07 | DRG: 023 | Disposition: A | Discharge status: Hospice | Payer: Medicare Other | Attending: Family Medicine | Admitting: Family Medicine

## 2018-03-23 ENCOUNTER — Emergency Department (HOSPITAL_COMMUNITY): Payer: Medicare Other

## 2018-03-23 DIAGNOSIS — I959 Hypotension, unspecified: Secondary | ICD-10-CM | POA: Diagnosis not present

## 2018-03-23 DIAGNOSIS — I6300 Cerebral infarction due to thrombosis of unspecified precerebral artery: Secondary | ICD-10-CM | POA: Diagnosis not present

## 2018-03-23 DIAGNOSIS — J84112 Idiopathic pulmonary fibrosis: Secondary | ICD-10-CM | POA: Diagnosis not present

## 2018-03-23 DIAGNOSIS — E119 Type 2 diabetes mellitus without complications: Secondary | ICD-10-CM | POA: Diagnosis not present

## 2018-03-23 DIAGNOSIS — R0600 Dyspnea, unspecified: Secondary | ICD-10-CM

## 2018-03-23 DIAGNOSIS — J9621 Acute and chronic respiratory failure with hypoxia: Secondary | ICD-10-CM | POA: Diagnosis not present

## 2018-03-23 DIAGNOSIS — E876 Hypokalemia: Secondary | ICD-10-CM | POA: Diagnosis not present

## 2018-03-23 DIAGNOSIS — T380X5A Adverse effect of glucocorticoids and synthetic analogues, initial encounter: Secondary | ICD-10-CM | POA: Diagnosis not present

## 2018-03-23 DIAGNOSIS — H518 Other specified disorders of binocular movement: Secondary | ICD-10-CM | POA: Diagnosis present

## 2018-03-23 DIAGNOSIS — Y732 Prosthetic and other implants, materials and accessory gastroenterology and urology devices associated with adverse incidents: Secondary | ICD-10-CM | POA: Diagnosis not present

## 2018-03-23 DIAGNOSIS — I447 Left bundle-branch block, unspecified: Secondary | ICD-10-CM | POA: Diagnosis not present

## 2018-03-23 DIAGNOSIS — I10 Essential (primary) hypertension: Secondary | ICD-10-CM

## 2018-03-23 DIAGNOSIS — I110 Hypertensive heart disease with heart failure: Secondary | ICD-10-CM | POA: Diagnosis not present

## 2018-03-23 DIAGNOSIS — Z8719 Personal history of other diseases of the digestive system: Secondary | ICD-10-CM

## 2018-03-23 DIAGNOSIS — G8191 Hemiplegia, unspecified affecting right dominant side: Secondary | ICD-10-CM | POA: Diagnosis not present

## 2018-03-23 DIAGNOSIS — Z880 Allergy status to penicillin: Secondary | ICD-10-CM

## 2018-03-23 DIAGNOSIS — Z9981 Dependence on supplemental oxygen: Secondary | ICD-10-CM

## 2018-03-23 DIAGNOSIS — S40029A Contusion of unspecified upper arm, initial encounter: Secondary | ICD-10-CM | POA: Diagnosis present

## 2018-03-23 DIAGNOSIS — I63032 Cerebral infarction due to thrombosis of left carotid artery: Secondary | ICD-10-CM | POA: Diagnosis not present

## 2018-03-23 DIAGNOSIS — J9611 Chronic respiratory failure with hypoxia: Secondary | ICD-10-CM | POA: Diagnosis not present

## 2018-03-23 DIAGNOSIS — I509 Heart failure, unspecified: Secondary | ICD-10-CM | POA: Diagnosis not present

## 2018-03-23 DIAGNOSIS — J9600 Acute respiratory failure, unspecified whether with hypoxia or hypercapnia: Secondary | ICD-10-CM | POA: Diagnosis not present

## 2018-03-23 DIAGNOSIS — R402233 Coma scale, best verbal response, inappropriate words, at hospital admission: Secondary | ICD-10-CM | POA: Diagnosis present

## 2018-03-23 DIAGNOSIS — I361 Nonrheumatic tricuspid (valve) insufficiency: Secondary | ICD-10-CM | POA: Diagnosis not present

## 2018-03-23 DIAGNOSIS — R339 Retention of urine, unspecified: Secondary | ICD-10-CM | POA: Diagnosis not present

## 2018-03-23 DIAGNOSIS — R310 Gross hematuria: Secondary | ICD-10-CM | POA: Diagnosis not present

## 2018-03-23 DIAGNOSIS — W19XXXA Unspecified fall, initial encounter: Secondary | ICD-10-CM | POA: Diagnosis present

## 2018-03-23 DIAGNOSIS — Z743 Need for continuous supervision: Secondary | ICD-10-CM | POA: Diagnosis not present

## 2018-03-23 DIAGNOSIS — I6522 Occlusion and stenosis of left carotid artery: Secondary | ICD-10-CM | POA: Diagnosis not present

## 2018-03-23 DIAGNOSIS — D509 Iron deficiency anemia, unspecified: Secondary | ICD-10-CM | POA: Diagnosis present

## 2018-03-23 DIAGNOSIS — I5042 Chronic combined systolic (congestive) and diastolic (congestive) heart failure: Secondary | ICD-10-CM | POA: Diagnosis not present

## 2018-03-23 DIAGNOSIS — Z950 Presence of cardiac pacemaker: Secondary | ICD-10-CM | POA: Diagnosis not present

## 2018-03-23 DIAGNOSIS — R402313 Coma scale, best motor response, none, at hospital admission: Secondary | ICD-10-CM | POA: Diagnosis not present

## 2018-03-23 DIAGNOSIS — Z794 Long term (current) use of insulin: Secondary | ICD-10-CM

## 2018-03-23 DIAGNOSIS — Z978 Presence of other specified devices: Secondary | ICD-10-CM

## 2018-03-23 DIAGNOSIS — R402113 Coma scale, eyes open, never, at hospital admission: Secondary | ICD-10-CM | POA: Diagnosis not present

## 2018-03-23 DIAGNOSIS — E785 Hyperlipidemia, unspecified: Secondary | ICD-10-CM | POA: Diagnosis not present

## 2018-03-23 DIAGNOSIS — Z01818 Encounter for other preprocedural examination: Secondary | ICD-10-CM | POA: Diagnosis not present

## 2018-03-23 DIAGNOSIS — Z95810 Presence of automatic (implantable) cardiac defibrillator: Secondary | ICD-10-CM

## 2018-03-23 DIAGNOSIS — D519 Vitamin B12 deficiency anemia, unspecified: Secondary | ICD-10-CM | POA: Diagnosis present

## 2018-03-23 DIAGNOSIS — R404 Transient alteration of awareness: Secondary | ICD-10-CM | POA: Diagnosis not present

## 2018-03-23 DIAGNOSIS — M19011 Primary osteoarthritis, right shoulder: Secondary | ICD-10-CM | POA: Diagnosis present

## 2018-03-23 DIAGNOSIS — Z7951 Long term (current) use of inhaled steroids: Secondary | ICD-10-CM

## 2018-03-23 DIAGNOSIS — I428 Other cardiomyopathies: Secondary | ICD-10-CM | POA: Diagnosis not present

## 2018-03-23 DIAGNOSIS — R4701 Aphasia: Secondary | ICD-10-CM | POA: Diagnosis not present

## 2018-03-23 DIAGNOSIS — R450 Nervousness: Secondary | ICD-10-CM | POA: Diagnosis not present

## 2018-03-23 DIAGNOSIS — R0689 Other abnormalities of breathing: Secondary | ICD-10-CM | POA: Diagnosis not present

## 2018-03-23 DIAGNOSIS — Z7189 Other specified counseling: Secondary | ICD-10-CM | POA: Diagnosis not present

## 2018-03-23 DIAGNOSIS — Z515 Encounter for palliative care: Secondary | ICD-10-CM

## 2018-03-23 DIAGNOSIS — I6523 Occlusion and stenosis of bilateral carotid arteries: Secondary | ICD-10-CM | POA: Diagnosis not present

## 2018-03-23 DIAGNOSIS — J9690 Respiratory failure, unspecified, unspecified whether with hypoxia or hypercapnia: Secondary | ICD-10-CM | POA: Diagnosis not present

## 2018-03-23 DIAGNOSIS — I63232 Cerebral infarction due to unspecified occlusion or stenosis of left carotid arteries: Secondary | ICD-10-CM | POA: Diagnosis not present

## 2018-03-23 DIAGNOSIS — I6602 Occlusion and stenosis of left middle cerebral artery: Secondary | ICD-10-CM | POA: Diagnosis present

## 2018-03-23 DIAGNOSIS — J8410 Pulmonary fibrosis, unspecified: Secondary | ICD-10-CM | POA: Diagnosis not present

## 2018-03-23 DIAGNOSIS — K219 Gastro-esophageal reflux disease without esophagitis: Secondary | ICD-10-CM | POA: Diagnosis present

## 2018-03-23 DIAGNOSIS — Z91041 Radiographic dye allergy status: Secondary | ICD-10-CM

## 2018-03-23 DIAGNOSIS — I952 Hypotension due to drugs: Secondary | ICD-10-CM | POA: Diagnosis not present

## 2018-03-23 DIAGNOSIS — Z66 Do not resuscitate: Secondary | ICD-10-CM | POA: Diagnosis present

## 2018-03-23 DIAGNOSIS — I69391 Dysphagia following cerebral infarction: Secondary | ICD-10-CM

## 2018-03-23 DIAGNOSIS — J849 Interstitial pulmonary disease, unspecified: Secondary | ICD-10-CM | POA: Diagnosis not present

## 2018-03-23 DIAGNOSIS — J189 Pneumonia, unspecified organism: Secondary | ICD-10-CM | POA: Diagnosis present

## 2018-03-23 DIAGNOSIS — K2270 Barrett's esophagus without dysplasia: Secondary | ICD-10-CM | POA: Diagnosis present

## 2018-03-23 DIAGNOSIS — D181 Lymphangioma, any site: Secondary | ICD-10-CM | POA: Diagnosis present

## 2018-03-23 DIAGNOSIS — I639 Cerebral infarction, unspecified: Secondary | ICD-10-CM | POA: Diagnosis not present

## 2018-03-23 DIAGNOSIS — J9601 Acute respiratory failure with hypoxia: Secondary | ICD-10-CM | POA: Diagnosis not present

## 2018-03-23 DIAGNOSIS — Z0189 Encounter for other specified special examinations: Secondary | ICD-10-CM

## 2018-03-23 DIAGNOSIS — I63412 Cerebral infarction due to embolism of left middle cerebral artery: Principal | ICD-10-CM | POA: Diagnosis present

## 2018-03-23 DIAGNOSIS — T83098A Other mechanical complication of other indwelling urethral catheter, initial encounter: Secondary | ICD-10-CM | POA: Diagnosis not present

## 2018-03-23 DIAGNOSIS — R29724 NIHSS score 24: Secondary | ICD-10-CM | POA: Diagnosis not present

## 2018-03-23 DIAGNOSIS — Z4682 Encounter for fitting and adjustment of non-vascular catheter: Secondary | ICD-10-CM | POA: Diagnosis not present

## 2018-03-23 DIAGNOSIS — R0989 Other specified symptoms and signs involving the circulatory and respiratory systems: Secondary | ICD-10-CM | POA: Diagnosis not present

## 2018-03-23 DIAGNOSIS — E1165 Type 2 diabetes mellitus with hyperglycemia: Secondary | ICD-10-CM | POA: Diagnosis present

## 2018-03-23 DIAGNOSIS — Z466 Encounter for fitting and adjustment of urinary device: Secondary | ICD-10-CM | POA: Diagnosis not present

## 2018-03-23 DIAGNOSIS — R918 Other nonspecific abnormal finding of lung field: Secondary | ICD-10-CM | POA: Diagnosis not present

## 2018-03-23 DIAGNOSIS — E11649 Type 2 diabetes mellitus with hypoglycemia without coma: Secondary | ICD-10-CM | POA: Diagnosis not present

## 2018-03-23 DIAGNOSIS — Z7982 Long term (current) use of aspirin: Secondary | ICD-10-CM

## 2018-03-23 DIAGNOSIS — R29818 Other symptoms and signs involving the nervous system: Secondary | ICD-10-CM | POA: Diagnosis not present

## 2018-03-23 DIAGNOSIS — I69321 Dysphasia following cerebral infarction: Secondary | ICD-10-CM | POA: Diagnosis not present

## 2018-03-23 DIAGNOSIS — E875 Hyperkalemia: Secondary | ICD-10-CM | POA: Diagnosis present

## 2018-03-23 DIAGNOSIS — Z91013 Allergy to seafood: Secondary | ICD-10-CM

## 2018-03-23 DIAGNOSIS — R29810 Facial weakness: Secondary | ICD-10-CM | POA: Diagnosis present

## 2018-03-23 DIAGNOSIS — Z79899 Other long term (current) drug therapy: Secondary | ICD-10-CM

## 2018-03-23 DIAGNOSIS — Z87891 Personal history of nicotine dependence: Secondary | ICD-10-CM

## 2018-03-23 DIAGNOSIS — R0902 Hypoxemia: Secondary | ICD-10-CM

## 2018-03-23 DIAGNOSIS — R319 Hematuria, unspecified: Secondary | ICD-10-CM | POA: Diagnosis not present

## 2018-03-23 DIAGNOSIS — J9589 Other postprocedural complications and disorders of respiratory system, not elsewhere classified: Secondary | ICD-10-CM | POA: Diagnosis not present

## 2018-03-23 DIAGNOSIS — D72829 Elevated white blood cell count, unspecified: Secondary | ICD-10-CM | POA: Diagnosis not present

## 2018-03-23 DIAGNOSIS — R1310 Dysphagia, unspecified: Secondary | ICD-10-CM | POA: Diagnosis present

## 2018-03-23 DIAGNOSIS — R4702 Dysphasia: Secondary | ICD-10-CM | POA: Diagnosis present

## 2018-03-23 DIAGNOSIS — Z86010 Personal history of colonic polyps: Secondary | ICD-10-CM

## 2018-03-23 DIAGNOSIS — E1159 Type 2 diabetes mellitus with other circulatory complications: Secondary | ICD-10-CM | POA: Diagnosis not present

## 2018-03-23 DIAGNOSIS — Y92230 Patient room in hospital as the place of occurrence of the external cause: Secondary | ICD-10-CM | POA: Diagnosis not present

## 2018-03-23 DIAGNOSIS — N3289 Other specified disorders of bladder: Secondary | ICD-10-CM | POA: Diagnosis not present

## 2018-03-23 DIAGNOSIS — J9811 Atelectasis: Secondary | ICD-10-CM | POA: Diagnosis not present

## 2018-03-23 DIAGNOSIS — H9190 Unspecified hearing loss, unspecified ear: Secondary | ICD-10-CM | POA: Diagnosis present

## 2018-03-23 HISTORY — PX: RADIOLOGY WITH ANESTHESIA: SHX6223

## 2018-03-23 HISTORY — PX: IR PERCUTANEOUS ART THROMBECTOMY/INFUSION INTRACRANIAL INC DIAG ANGIO: IMG6087

## 2018-03-23 HISTORY — PX: IR CT HEAD LTD: IMG2386

## 2018-03-23 LAB — URINALYSIS, ROUTINE W REFLEX MICROSCOPIC
Bilirubin Urine: NEGATIVE
Glucose, UA: NEGATIVE mg/dL
Hgb urine dipstick: NEGATIVE
Ketones, ur: NEGATIVE mg/dL
Leukocytes, UA: NEGATIVE
Nitrite: NEGATIVE
Protein, ur: NEGATIVE mg/dL
pH: 5 (ref 5.0–8.0)

## 2018-03-23 LAB — CBC
HCT: 45.2 % (ref 39.0–52.0)
Hemoglobin: 14.3 g/dL (ref 13.0–17.0)
MCH: 29.7 pg (ref 26.0–34.0)
MCHC: 31.6 g/dL (ref 30.0–36.0)
MCV: 94 fL (ref 80.0–100.0)
Platelets: 208 10*3/uL (ref 150–400)
RBC: 4.81 MIL/uL (ref 4.22–5.81)
RDW: 15.4 % (ref 11.5–15.5)
WBC: 7.8 10*3/uL (ref 4.0–10.5)
nRBC: 0 % (ref 0.0–0.2)

## 2018-03-23 LAB — I-STAT CHEM 8, ED
BUN: 34 mg/dL — ABNORMAL HIGH (ref 8–23)
Calcium, Ion: 1.05 mmol/L — ABNORMAL LOW (ref 1.15–1.40)
Chloride: 107 mmol/L (ref 98–111)
Creatinine, Ser: 1.1 mg/dL (ref 0.61–1.24)
Glucose, Bld: 158 mg/dL — ABNORMAL HIGH (ref 70–99)
HEMATOCRIT: 45 % (ref 39.0–52.0)
Hemoglobin: 15.3 g/dL (ref 13.0–17.0)
Potassium: 5.8 mmol/L — ABNORMAL HIGH (ref 3.5–5.1)
Sodium: 137 mmol/L (ref 135–145)
TCO2: 25 mmol/L (ref 22–32)

## 2018-03-23 LAB — MAGNESIUM: Magnesium: 1.9 mg/dL (ref 1.7–2.4)

## 2018-03-23 LAB — COMPREHENSIVE METABOLIC PANEL
ALT: 28 U/L (ref 0–44)
AST: 48 U/L — ABNORMAL HIGH (ref 15–41)
Albumin: 3.1 g/dL — ABNORMAL LOW (ref 3.5–5.0)
Alkaline Phosphatase: 33 U/L — ABNORMAL LOW (ref 38–126)
Anion gap: 12 (ref 5–15)
BUN: 24 mg/dL — ABNORMAL HIGH (ref 8–23)
CHLORIDE: 101 mmol/L (ref 98–111)
CO2: 19 mmol/L — ABNORMAL LOW (ref 22–32)
Calcium: 8.4 mg/dL — ABNORMAL LOW (ref 8.9–10.3)
Creatinine, Ser: 1.24 mg/dL (ref 0.61–1.24)
GFR calc Af Amer: >60 mL/min (ref >60)
GFR calc non Af Amer: 56 mL/min — ABNORMAL LOW (ref >60)
Glucose, Bld: 154 mg/dL — ABNORMAL HIGH (ref 70–99)
Potassium: 5 mmol/L (ref 3.5–5.1)
Sodium: 132 mmol/L — ABNORMAL LOW (ref 135–145)
Total Bilirubin: 1.2 mg/dL (ref 0.3–1.2)
Total Protein: 6.2 g/dL — ABNORMAL LOW (ref 6.5–8.1)

## 2018-03-23 LAB — DIFFERENTIAL
ABS IMMATURE GRANULOCYTES: 0.02 10*3/uL (ref 0.00–0.07)
Basophils Absolute: 0.1 10*3/uL (ref 0.0–0.1)
Basophils Relative: 1 %
Eosinophils Absolute: 0.3 10*3/uL (ref 0.0–0.5)
Eosinophils Relative: 4 %
Immature Granulocytes: 0 %
Lymphocytes Relative: 26 %
Lymphs Abs: 2 10*3/uL (ref 0.7–4.0)
Monocytes Absolute: 0.6 10*3/uL (ref 0.1–1.0)
Monocytes Relative: 8 %
Neutro Abs: 4.8 10*3/uL (ref 1.7–7.7)
Neutrophils Relative %: 61 %

## 2018-03-23 LAB — BASIC METABOLIC PANEL
Anion gap: 7 (ref 5–15)
BUN: 22 mg/dL (ref 8–23)
CO2: 20 mmol/L — ABNORMAL LOW (ref 22–32)
Calcium: 7.6 mg/dL — ABNORMAL LOW (ref 8.9–10.3)
Chloride: 107 mmol/L (ref 98–111)
Creatinine, Ser: 0.91 mg/dL (ref 0.61–1.24)
GFR calc Af Amer: >60 mL/min (ref >60)
GFR calc non Af Amer: >60 mL/min (ref >60)
Glucose, Bld: 159 mg/dL — ABNORMAL HIGH (ref 70–99)
Potassium: 3.9 mmol/L (ref 3.5–5.1)
Sodium: 134 mmol/L — ABNORMAL LOW (ref 135–145)

## 2018-03-23 LAB — POCT I-STAT 3, ART BLOOD GAS (G3+)
Acid-base deficit: 4 mmol/L — ABNORMAL HIGH (ref 0.0–2.0)
Bicarbonate: 22.3 mmol/L (ref 20.0–28.0)
O2 Saturation: 97 %
Patient temperature: 100.2
TCO2: 24 mmol/L (ref 22–32)
pCO2 arterial: 47.6 mmHg (ref 32.0–48.0)
pH, Arterial: 7.283 — ABNORMAL LOW (ref 7.350–7.450)
pO2, Arterial: 109 mmHg — ABNORMAL HIGH (ref 83.0–108.0)

## 2018-03-23 LAB — PROTIME-INR
INR: 1.16
Prothrombin Time: 14.7 seconds (ref 11.4–15.2)

## 2018-03-23 LAB — APTT: aPTT: 27 seconds (ref 24–36)

## 2018-03-23 LAB — GLUCOSE, CAPILLARY: Glucose-Capillary: 154 mg/dL — ABNORMAL HIGH (ref 70–99)

## 2018-03-23 LAB — I-STAT TROPONIN, ED: Troponin i, poc: 0.05 ng/mL (ref 0.00–0.08)

## 2018-03-23 LAB — TRIGLYCERIDES: Triglycerides: 130 mg/dL (ref <150)

## 2018-03-23 LAB — LACTIC ACID, PLASMA: Lactic Acid, Venous: 1 mmol/L (ref 0.5–1.9)

## 2018-03-23 LAB — CBG MONITORING, ED: Glucose-Capillary: 160 mg/dL — ABNORMAL HIGH (ref 70–99)

## 2018-03-23 SURGERY — RADIOLOGY WITH ANESTHESIA
Anesthesia: General

## 2018-03-23 MED ORDER — CEFAZOLIN SODIUM-DEXTROSE 2-4 GM/100ML-% IV SOLN
INTRAVENOUS | Status: AC
  Filled 2018-03-23: qty 100

## 2018-03-23 MED ORDER — SUCCINYLCHOLINE 20MG/ML (10ML) SYRINGE FOR MEDFUSION PUMP - OPTIME
INTRAMUSCULAR | Status: DC | PRN
  Administered 2018-03-23: 100 mg via INTRAVENOUS

## 2018-03-23 MED ORDER — CEFAZOLIN SODIUM-DEXTROSE 2-3 GM-%(50ML) IV SOLR
INTRAVENOUS | Status: DC | PRN
  Administered 2018-03-23: 2 g via INTRAVENOUS

## 2018-03-23 MED ORDER — ACETAMINOPHEN 650 MG RE SUPP: 650.0000 mg | RECTAL | Status: DC | PRN

## 2018-03-23 MED ORDER — INSULIN ASPART 100 UNIT/ML ~~LOC~~ SOLN: 0.0000 [IU] | Freq: Three times a day (TID) | SUBCUTANEOUS | Status: DC

## 2018-03-23 MED ORDER — SENNOSIDES-DOCUSATE SODIUM 8.6-50 MG PO TABS
1.0000 | ORAL_TABLET | Freq: Every evening | ORAL | Status: DC | PRN
  Administered 2018-03-26 – 2018-04-02 (×2): 1 via ORAL
  Filled 2018-03-23 (×2): qty 1

## 2018-03-23 MED ORDER — EPTIFIBATIDE 20 MG/10ML IV SOLN
INTRAVENOUS | Status: AC
  Filled 2018-03-23: qty 10

## 2018-03-23 MED ORDER — FENTANYL CITRATE (PF) 100 MCG/2ML IJ SOLN
50.0000 ug | INTRAMUSCULAR | Status: DC | PRN
  Filled 2018-03-23: qty 2

## 2018-03-23 MED ORDER — ARFORMOTEROL TARTRATE 15 MCG/2ML IN NEBU
15.0000 ug | INHALATION_SOLUTION | Freq: Two times a day (BID) | RESPIRATORY_TRACT | Status: DC
  Administered 2018-03-24 – 2018-04-04 (×22): 15 ug via RESPIRATORY_TRACT
  Filled 2018-03-23 (×23): qty 2

## 2018-03-23 MED ORDER — LIDOCAINE HCL (CARDIAC) PF 100 MG/5ML IV SOSY
PREFILLED_SYRINGE | INTRAVENOUS | Status: DC | PRN
  Administered 2018-03-23: 80 mg via INTRATRACHEAL

## 2018-03-23 MED ORDER — INSULIN ASPART 100 UNIT/ML ~~LOC~~ SOLN
0.0000 [IU] | SUBCUTANEOUS | Status: DC
  Administered 2018-03-24: 3 [IU] via SUBCUTANEOUS
  Administered 2018-03-24: 2 [IU] via SUBCUTANEOUS
  Administered 2018-03-24 – 2018-03-25 (×3): 3 [IU] via SUBCUTANEOUS
  Administered 2018-03-25: 2 [IU] via SUBCUTANEOUS
  Administered 2018-03-26: 3 [IU] via SUBCUTANEOUS
  Administered 2018-03-26: 5 [IU] via SUBCUTANEOUS
  Administered 2018-03-26 (×2): 3 [IU] via SUBCUTANEOUS
  Administered 2018-03-26 (×2): 5 [IU] via SUBCUTANEOUS

## 2018-03-23 MED ORDER — ASPIRIN 81 MG PO CHEW
CHEWABLE_TABLET | ORAL | Status: AC
  Administered 2018-03-23: 81 mg via OROGASTRIC
  Filled 2018-03-23: qty 1

## 2018-03-23 MED ORDER — NITROGLYCERIN 1 MG/10 ML FOR IR/CATH LAB
INTRA_ARTERIAL | Status: AC
  Administered 2018-03-23: 75 ug via INTRA_ARTERIAL
  Filled 2018-03-23: qty 10

## 2018-03-23 MED ORDER — FENTANYL CITRATE (PF) 100 MCG/2ML IJ SOLN
INTRAMUSCULAR | Status: AC
  Filled 2018-03-23: qty 2

## 2018-03-23 MED ORDER — PROPOFOL 500 MG/50ML IV EMUL
INTRAVENOUS | Status: DC | PRN
  Administered 2018-03-23: 50 ug/kg/min via INTRAVENOUS

## 2018-03-23 MED ORDER — IOPAMIDOL (ISOVUE-370) INJECTION 76%
50.0000 mL | Freq: Once | INTRAVENOUS | Status: AC | PRN
  Administered 2018-03-23: 50 mL via INTRAVENOUS

## 2018-03-23 MED ORDER — ROCURONIUM 10MG/ML (10ML) SYRINGE FOR MEDFUSION PUMP - OPTIME
INTRAVENOUS | Status: DC | PRN
  Administered 2018-03-23 (×2): 50 mg via INTRAVENOUS

## 2018-03-23 MED ORDER — PANTOPRAZOLE SODIUM 40 MG IV SOLR
40.0000 mg | Freq: Every day | INTRAVENOUS | Status: DC
  Administered 2018-03-23 – 2018-04-01 (×10): 40 mg via INTRAVENOUS
  Filled 2018-03-23 (×10): qty 40

## 2018-03-23 MED ORDER — SODIUM CHLORIDE 0.9 % IV SOLN
INTRAVENOUS | Status: DC | PRN
  Administered 2018-03-23: 25 ug/min via INTRAVENOUS

## 2018-03-23 MED ORDER — CLEVIDIPINE BUTYRATE 0.5 MG/ML IV EMUL: 0.0000 mg/h | INTRAVENOUS | Status: DC

## 2018-03-23 MED ORDER — FENTANYL CITRATE (PF) 100 MCG/2ML IJ SOLN
50.0000 ug | INTRAMUSCULAR | Status: DC | PRN
  Administered 2018-03-25 – 2018-03-27 (×4): 50 ug via INTRAVENOUS
  Filled 2018-03-23 (×4): qty 2

## 2018-03-23 MED ORDER — PHENYLEPHRINE HCL 10 MG/ML IJ SOLN
INTRAMUSCULAR | Status: DC | PRN
  Administered 2018-03-23 (×5): 40 ug via INTRAVENOUS

## 2018-03-23 MED ORDER — SODIUM CHLORIDE 0.9 % IV SOLN
INTRAVENOUS | Status: DC | PRN
  Administered 2018-03-23 (×2): via INTRAVENOUS

## 2018-03-23 MED ORDER — ACETAMINOPHEN 325 MG PO TABS: 650.0000 mg | ORAL_TABLET | ORAL | Status: DC | PRN

## 2018-03-23 MED ORDER — DEXAMETHASONE SODIUM PHOSPHATE 10 MG/ML IJ SOLN
INTRAMUSCULAR | Status: DC | PRN
  Administered 2018-03-23: 10 mg via INTRAVENOUS

## 2018-03-23 MED ORDER — SODIUM CHLORIDE 0.9 % IV SOLN
250.0000 mL | INTRAVENOUS | Status: DC
  Administered 2018-03-26: 250 mL via INTRAVENOUS
  Administered 2018-03-29: 500 mL via INTRAVENOUS

## 2018-03-23 MED ORDER — BUDESONIDE 180 MCG/ACT IN AEPB: 2.0000 | INHALATION_SPRAY | Freq: Two times a day (BID) | RESPIRATORY_TRACT | Status: DC

## 2018-03-23 MED ORDER — ARTIFICIAL TEARS OPHTHALMIC OINT
TOPICAL_OINTMENT | OPHTHALMIC | Status: DC | PRN
  Administered 2018-03-23: 1 via OPHTHALMIC

## 2018-03-23 MED ORDER — SODIUM CHLORIDE 0.9 % IV SOLN: 50.0000 mL | Freq: Once | INTRAVENOUS | Status: DC

## 2018-03-23 MED ORDER — BUDESONIDE 0.5 MG/2ML IN SUSP
0.5000 mg | Freq: Two times a day (BID) | RESPIRATORY_TRACT | Status: DC
  Administered 2018-03-24 – 2018-04-04 (×22): 0.5 mg via RESPIRATORY_TRACT
  Filled 2018-03-23 (×23): qty 2

## 2018-03-23 MED ORDER — IOHEXOL 300 MG/ML  SOLN
70.0000 mL | Freq: Once | INTRAMUSCULAR | Status: AC | PRN
  Administered 2018-03-23: 70 mL via INTRA_ARTERIAL

## 2018-03-23 MED ORDER — ATORVASTATIN CALCIUM 10 MG PO TABS
20.0000 mg | ORAL_TABLET | Freq: Every day | ORAL | Status: DC
  Filled 2018-03-23: qty 2

## 2018-03-23 MED ORDER — METHYLPREDNISOLONE SODIUM SUCC 125 MG IJ SOLR: 125.0000 mg | Freq: Once | INTRAMUSCULAR | Status: DC

## 2018-03-23 MED ORDER — LABETALOL HCL 5 MG/ML IV SOLN: 20.0000 mg | Freq: Once | INTRAVENOUS | Status: DC

## 2018-03-23 MED ORDER — IOHEXOL 300 MG/ML  SOLN
30.0000 mL | Freq: Once | INTRAMUSCULAR | Status: AC | PRN
  Administered 2018-03-23: 30 mL via INTRA_ARTERIAL

## 2018-03-23 MED ORDER — NICARDIPINE HCL IN NACL 20-0.86 MG/200ML-% IV SOLN
INTRAVENOUS | Status: DC | PRN
  Administered 2018-03-23: 5 mg/h via INTRAVENOUS

## 2018-03-23 MED ORDER — INSULIN ASPART 100 UNIT/ML ~~LOC~~ SOLN: 0.0000 [IU] | Freq: Every day | SUBCUTANEOUS | Status: DC

## 2018-03-23 MED ORDER — ALTEPLASE (STROKE) FULL DOSE INFUSION
0.9000 mg/kg | Freq: Once | INTRAVENOUS | Status: AC
  Administered 2018-03-23: 60.7 mg via INTRAVENOUS
  Filled 2018-03-23: qty 100

## 2018-03-23 MED ORDER — TIROFIBAN HCL IN NACL 5-0.9 MG/100ML-% IV SOLN
INTRAVENOUS | Status: AC
  Filled 2018-03-23: qty 100

## 2018-03-23 MED ORDER — ASPIRIN 325 MG PO TABS
ORAL_TABLET | ORAL | Status: AC
  Filled 2018-03-23: qty 1

## 2018-03-23 MED ORDER — EPHEDRINE SULFATE 50 MG/ML IJ SOLN
INTRAMUSCULAR | Status: DC | PRN
  Administered 2018-03-23: 5 mg via INTRAVENOUS

## 2018-03-23 MED ORDER — FENTANYL CITRATE (PF) 100 MCG/2ML IJ SOLN
INTRAMUSCULAR | Status: DC | PRN
  Administered 2018-03-23: 100 ug via INTRAVENOUS

## 2018-03-23 MED ORDER — SODIUM CHLORIDE 0.9 % IV SOLN: INTRAVENOUS | Status: DC

## 2018-03-23 MED ORDER — LIDOCAINE HCL 1 % IJ SOLN
INTRAMUSCULAR | Status: AC
  Filled 2018-03-23: qty 20

## 2018-03-23 MED ORDER — PROPOFOL 10 MG/ML IV BOLUS
INTRAVENOUS | Status: DC | PRN
  Administered 2018-03-23: 130 mg via INTRAVENOUS

## 2018-03-23 MED ORDER — ACETAMINOPHEN 160 MG/5ML PO SOLN: 650.0000 mg | ORAL | Status: DC | PRN

## 2018-03-23 MED ORDER — TICAGRELOR 90 MG PO TABS
ORAL_TABLET | ORAL | Status: AC
  Administered 2018-03-23: 180 mg via OROGASTRIC
  Filled 2018-03-23: qty 2

## 2018-03-23 MED ORDER — DIPHENHYDRAMINE HCL 50 MG/ML IJ SOLN
INTRAMUSCULAR | Status: AC
  Filled 2018-03-23: qty 1

## 2018-03-23 MED ORDER — ACETAMINOPHEN 160 MG/5ML PO SOLN
650.0000 mg | ORAL | Status: DC | PRN
  Administered 2018-03-25: 650 mg
  Filled 2018-03-23: qty 20.3

## 2018-03-23 MED ORDER — PHENYLEPHRINE HCL-NACL 10-0.9 MG/250ML-% IV SOLN: 0.0000 ug/min | INTRAVENOUS | Status: DC

## 2018-03-23 MED ORDER — PROPOFOL 1000 MG/100ML IV EMUL
0.0000 ug/kg/min | INTRAVENOUS | Status: DC
  Administered 2018-03-23 – 2018-03-24 (×3): 30 ug/kg/min via INTRAVENOUS
  Filled 2018-03-23 (×2): qty 100

## 2018-03-23 MED ORDER — DIPHENHYDRAMINE HCL 50 MG/ML IJ SOLN: 50.0000 mg | Freq: Once | INTRAMUSCULAR | Status: DC

## 2018-03-23 MED ORDER — STROKE: EARLY STAGES OF RECOVERY BOOK
Freq: Once | Status: DC
  Filled 2018-03-23 (×3): qty 1

## 2018-03-23 MED ORDER — ALBUTEROL SULFATE (2.5 MG/3ML) 0.083% IN NEBU
2.5000 mg | INHALATION_SOLUTION | RESPIRATORY_TRACT | Status: DC | PRN
  Administered 2018-04-02: 2.5 mg via RESPIRATORY_TRACT
  Filled 2018-03-23: qty 3

## 2018-03-23 MED ORDER — PHENYLEPHRINE HCL-NACL 10-0.9 MG/250ML-% IV SOLN
0.0000 ug/min | INTRAVENOUS | Status: DC
  Administered 2018-03-23: 40 ug/min via INTRAVENOUS
  Administered 2018-03-24: 20 ug/min via INTRAVENOUS
  Filled 2018-03-23 (×2): qty 250

## 2018-03-23 MED ORDER — CLOPIDOGREL BISULFATE 300 MG PO TABS
ORAL_TABLET | ORAL | Status: AC
  Filled 2018-03-23: qty 1

## 2018-03-23 MED ORDER — PROPOFOL 1000 MG/100ML IV EMUL
INTRAVENOUS | Status: AC
  Administered 2018-03-23: 30 ug/kg/min via INTRAVENOUS
  Filled 2018-03-23: qty 100

## 2018-03-23 MED ORDER — SODIUM CHLORIDE 0.9 % IV SOLN
INTRAVENOUS | Status: DC
  Administered 2018-03-23 – 2018-03-24 (×2): via INTRAVENOUS

## 2018-03-23 NOTE — Progress Notes (Signed)
Pharmacist Code Stroke Response  Notified to mix tPA at [place] by Dr. Leonel Ramsay Delivered tPA to RN at 1831  tPA dose = 6.8mg  bolus over 1 minute followed by 60.7mg  for a total dose of 67.5mg  over 1 hour  Issues/delays encountered (if applicable): N/A  Terry Peck, Terry Peck 03/23/18 6:35 PM

## 2018-03-23 NOTE — ED Notes (Signed)
Departed from patient in IR with IR team at patient bedside.

## 2018-03-23 NOTE — Anesthesia Procedure Notes (Signed)
Arterial Line Insertion Start/End12/04/2017 6:55 PM, 03/23/2018 7:00 PM Performed by: Valetta Fuller, CRNA, CRNA  Lidocaine 1% used for infiltration Left, radial was placed Catheter size: 20 G Hand hygiene performed  and maximum sterile barriers used   Attempts: 1 Procedure performed without using ultrasound guided technique. Following insertion, dressing applied and Biopatch. Post procedure assessment: normal  Patient tolerated the procedure well with no immediate complications.

## 2018-03-23 NOTE — Anesthesia Procedure Notes (Signed)
Procedure Name: Intubation Date/Time: 03/23/2018 7:15 PM Performed by: Valetta Fuller, CRNA Pre-anesthesia Checklist: Patient identified, Emergency Drugs available, Suction available and Patient being monitored Patient Re-evaluated:Patient Re-evaluated prior to induction Oxygen Delivery Method: Circle system utilized Preoxygenation: Pre-oxygenation with 100% oxygen Induction Type: IV induction, Rapid sequence and Cricoid Pressure applied Laryngoscope Size: Miller and 2 Grade View: Grade I Tube type: Subglottic suction tube Tube size: 7.5 mm Number of attempts: 1 Airway Equipment and Method: Stylet Placement Confirmation: ETT inserted through vocal cords under direct vision,  positive ETCO2 and breath sounds checked- equal and bilateral Secured at: 23 cm Tube secured with: Tape Dental Injury: Teeth and Oropharynx as per pre-operative assessment

## 2018-03-23 NOTE — CV Procedure (Signed)
INR  34 Y M RH LSW 530pm.Premorbid mRSS 0 to1  CT brain Lt MCA hyperdense sign. ASPECTS 10  CTA Occluded LT MCA sup division M2, and LT ICA intra and extracranially. Option of endovascular  revascularization  D/W daughter and spouse.  Procedure,benefits alternatives discussed.Risks of ICH 10 %,worsening neurologiocal condition,Vent dependency,death,unable to revascularize and vascular injury  Discussed. Questions answered to their satisfaction. Informed witnessed consent obtained . S.Ameia Morency MD

## 2018-03-23 NOTE — Transfer of Care (Signed)
Immediate Anesthesia Transfer of Care Note  Patient: Terry Peck  Procedure(s) Performed: RADIOLOGY WITH ANESTHESIA (N/A )  Patient Location: NICU  Anesthesia Type:General  Level of Consciousness: Patient remains intubated per anesthesia plan  Airway & Oxygen Therapy: Patient placed on Ventilator (see vital sign flow sheet for setting)  Post-op Assessment: Report given to RN and Post -op Vital signs reviewed and stable  Post vital signs: Reviewed and stable  Last Vitals:  Vitals Value Taken Time  BP 126/60 03/23/2018  9:33 PM  Temp    Pulse 65 03/23/2018  9:37 PM  Resp 26 03/23/2018  9:37 PM  SpO2 100 % 03/23/2018  9:37 PM  Vitals shown include unvalidated device data.  Last Pain: There were no vitals filed for this visit.       Complications: No apparent anesthesia complications

## 2018-03-23 NOTE — ED Triage Notes (Signed)
Pt arrives by EMS as code stroke, LSN 5:30 PM by wife, who reports she then heard a "thud" and found him lying in the floor when she called her neighbor who came and activated EMS. On arrival he is weak on the right side and has aphasia.

## 2018-03-23 NOTE — H&P (Addendum)
NEURO HOSPITALIST  H&P   Requesting Physician: Dr. Gilford Raid    Chief Complaint: right side weakness and aphasia  History obtained from:  Chart/ EMS  HPI:                                                                                                                                         Terry Peck is an 77 y.o. male HTN, DM, pulmonary fibrosis, HLD, CHF who presented to [facility] ED as a code stroke with c/o of right facial droop, right sided weakness and aphasia.   No prior stroke history. Not on anti-coagulation. Per EMS patient was completely normal and at 5170 wife heard a "thud" she went to check on him and he had fallen. Then she noticed the right side weakness and facial droop. EMS was called.  ED course:  CTH: no hemorrhage CTA: left M2 occlusion   Date last known well: Date: 03/23/2018 Time last known well: Time: 17:30 tPA Given: Yes, started bolus @ 1834 Modified Rankin: Rankin Score=0 NIHSS:24     Past Medical History:  Diagnosis Date  . Abnormal ANCA test 07/03/2011  . Allergy   . Arthritis    "mild; right shoulder" (01/29/2012)  . B12 deficiency anemia   . Barrett's esophagus 11/23/2010  . Bundle branch block, left   . Cataract    L eye surgery  . CHF (congestive heart failure) ([place])    pacemaker  . Chronic systolic dysfunction of left ventricle    EF 30%  . Colon polyps 11/23/2010  . GERD (gastroesophageal reflux disease)   . Hyperlipidemia 10/14/2012  . Hypertension   . ICD (implantable cardiac defibrillator) in place   . Iron deficiency anemia 05/30/2011  . Liver abscess 1980's  . Nonischemic cardiomyopathy ([place])    s/p [employer] Jude BiV ICD 01/29/12  . Pulmonary fibrosis ([place])   . Pulmonary nodules   . Shortness of breath    "related to heart being out of rhythm" (01/29/2012)  . Type II diabetes mellitus ([place])     Past Surgical History:  Procedure Laterality Date  . abscess drained  ?1980's   liver  .  BI-VENTRICULAR IMPLANTABLE CARDIOVERTER DEFIBRILLATOR N/A 01/29/2012   Procedure: BI-VENTRICULAR IMPLANTABLE CARDIOVERTER DEFIBRILLATOR  (CRT-D);  Surgeon: Thompson Grayer, MD;  Location: [facility] CATH LAB;  Service: Cardiovascular;  Laterality: N/A;  . CARDIAC DEFIBRILLATOR PLACEMENT  01/29/2012   SJM Quadra Assura BiV ICD implanted by Dr Rayann Heman  . CATARACT EXTRACTION Left   . COLONOSCOPY    . TONSILLECTOMY  1952  . UPPER GI ENDOSCOPY      Family History  Problem Relation Age  of Onset  . Heart disease Mother   . Colon cancer Neg Hx   . Esophageal cancer Neg Hx   . Stomach cancer Neg Hx          Social History:  reports that he quit smoking about 29 years ago. His smoking use included cigarettes. He has a 5.00 pack-year smoking history. He has never used smokeless tobacco. He reports that he does not drink alcohol or use drugs.  Allergies:  Allergies  Allergen Reactions  . Penicillins Anaphylaxis and Other (See Comments)    "swelling of eyes; throat; could breath good" (01/29/2012)  . Iodine Other (See Comments)    "if I get iodine dye, I'll get a fever" (01/29/2012)  . Fish Oil Itching    Pt said only when given through IV    Medications:                                                                                                                           Current Facility-Administered Medications  Medication Dose Route Frequency Provider Last Rate Last Dose  . alteplase (ACTIVASE) 1 mg/mL infusion 67.5 mg  0.9 mg/kg Intravenous Once Greta Doom, MD       Followed by  . 0.9 %  sodium chloride infusion  50 mL Intravenous Once Greta Doom, MD       Current Outpatient Medications  Medication Sig Dispense Refill  . acetaminophen-codeine (TYLENOL #3) 300-30 MG tablet Take 1 tablet by mouth every 8 (eight) hours as needed for moderate pain. 30 tablet 0  . aspirin EC 81 MG tablet Take 81 mg by mouth daily.    Marland Kitchen atorvastatin (LIPITOR) 20 MG tablet TAKE 1 TABLET BY MOUTH  ONCE DAILY 90 tablet 3  . budesonide (PULMICORT) 180 MCG/ACT inhaler Inhale 2 puffs into the lungs 2 (two) times daily. 1 Inhaler 6  . Calcium-Magnesium-Zinc 1000-400-15 MG TABS Take 1 tablet by mouth daily.     . carvedilol (COREG) 25 MG tablet TAKE 1 TABLET BY MOUTH TWICE DAILY WITH MEALS 180 tablet 3  . Cholecalciferol (VITAMIN D) 1000 UNITS capsule Take 1,000 Units by mouth daily.     . Cyanocobalamin (B-12 PO) Take 1 tablet by mouth daily.     . furosemide (LASIX) 40 MG tablet Take 1 tablet (40 mg total) by mouth daily. 30 tablet 11  . ibuprofen (ADVIL,MOTRIN) 200 MG tablet Take 200-300 mg by mouth 2 (two) times daily as needed. For pain    . Insulin Glargine (LANTUS SOLOSTAR) 100 UNIT/ML Solostar Pen INJECT 50 UNITS SUBCUTANEOUSLY ONCE DAILY 5 pen 11  . Insulin Pen Needle (RELION PEN NEEDLES) 32G X 4 MM MISC Use as directed once daily E11.9 100 each 3  . losartan (COZAAR) 50 MG tablet Take 1 tablet (50 mg total) by mouth daily. 90 tablet 3  . Lutein 20 MG CAPS Take 20 mg by mouth daily.    . metFORMIN (GLUCOPHAGE-XR) 500 MG 24 hr  tablet Take 4 tablets (2,000 mg total) by mouth daily with breakfast. 360 tablet 3  . methocarbamol (ROBAXIN) 500 MG tablet TAKE ONE TABLET BY MOUTH ONCE DAILY AS NEEDED 30 tablet 0  . Multiple Vitamin (MULTIVITAMIN WITH MINERALS) TABS Take 1 tablet by mouth daily.    . OMEGA 3 1000 MG CAPS Take 1,000 mg by mouth 2 (two) times daily.     Marland Kitchen omeprazole (PRILOSEC) 20 MG capsule TAKE 1 CAPSULE BY MOUTH TWICE DAILY 180 capsule 3  . pioglitazone (ACTOS) 30 MG tablet TAKE 1 TABLET BY MOUTH ONCE DAILY 90 tablet 3     ROS:                                                                                                                                        unobtainable from patient due to mental status     General Examination:                                                                                                      Weight 75 kg.  HEENT-  Normocephalic,  no lesions, without obvious abnormality.  Normal external eye and conjunctiva. Cardiovascular- S1-S2 audible, pulses palpable throughout  Lungs-no rhonchi or wheezing noted, no excessive working breathing.  Saturations within normal limits on non-rebreather Abdomen- All 4 quadrants palpated and non tender Extremities- Warm, dry and intact Musculoskeletal-no joint tenderness, deformity or swelling Skin-warm and dry, no hyperpigmentation, vitiligo, or suspicious lesions  Neurological Examination Mental Status: On arrival, he had a dense aphasia, he had mild improvement at one point and was able to answer some simple questions, but then began perseverating on yes and stopped following commands.  Cranial Nerves: Right hemianopia does not blink to threat, right facial droop.  left gaze preference. PERRL Motor/Sensory: Right side flaccid, withdraws to noxious left side. Tone and bulk:normal tone throughout; no atrophy noted Sensory: He does not respond as much to noxious stimulation on the right as the left Cerebellar: He does not perform Gait: deferred   Lab Results: Basic Metabolic Panel: Recent Labs  Lab 03/23/18 1839  NA 137  K 5.8*  CL 107  GLUCOSE 158*  BUN 34*  CREATININE 1.10    CBC: Recent Labs  Lab 03/23/18 1839  HGB 15.3  HCT 45.0     CBG: Recent Labs  Lab 03/23/18 1828  GLUCAP 160*    Imaging: Ct Head Code Stroke Wo Contrast  Result Date: 03/23/2018 CLINICAL DATA:  Code stroke.  Right-sided weakness, aphasia EXAM: CT HEAD WITHOUT CONTRAST TECHNIQUE: Contiguous axial images were obtained from the base of the skull through the vertex without intravenous contrast. COMPARISON:  None. FINDINGS: Brain: Moderate atrophy. Patchy hypodensity in the cerebral white matter bilaterally compatible with mild chronic microvascular ischemia. 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 Negative for acute hemorrhage.  No acute infarct or mass.  Vascular: Hyperdense left MCA. Hyperdense terminal left internal carotid artery. Atherosclerotic calcification cavernous carotid bilaterally. Skull: Negative Sinuses/Orbits: Paranasal sinuses clear. Bilateral cataract surgery. Other: None ASPECTS ([place] Stroke Program Early CT Score) - Ganglionic level infarction (caudate, lentiform nuclei, internal capsule, insula, M1-M3 cortex): 7 - Supraganglionic infarction (M4-M6 cortex): 3 Total score (0-10 with 10 being normal): 10 IMPRESSION: 1. Hyperdense terminal left internal carotid artery and left MCA compatible with thrombosis. No evidence of acute infarct or hemorrhage 2. Atrophy and chronic microvascular ischemic changes in the white matter. 3. ASPECTS is 10 4. These results were called by telephone at the time of interpretation on 03/23/2018 at 6:33 pm to Dr. Leonel Ramsay, who verbally acknowledged these results. Electronically Signed   By: Franchot Gallo M.D.   On: 03/23/2018 18:35    Assessment: 77 y.o. male HTN, DM, pulmonary fibrosis, HLD, CHF who presented to [facility] ED as a code stroke with c/o of right facial droop, right sided weakness and aphasia. CTH: no hemorrhage. CTA: left M2 occlusion. TPA started and patient taken to IR for thrombectomy.  Stroke Risk Factors - hyperlipidemia and hypertension:    Plan: -- BP goal :  Per Neuro IR reccommedations --MRI Brain ( patient with pacemaker) -- CTA  --Echocardiogram -- ASA -- High intensity Statin if LDL > 70 -- HgbA1c, fasting lipid panel -- PT consult, OT consult, Speech consult --Telemetry monitoring --Frequent neuro checks --Stroke swallow screen  CNS  -Close neuro monitoring  RESP - patient with pulmonary fibrosis -02 to keep sats > 93% -monitor  CV Essential (primary) hypertension -Aggressive BP control, goal SBP < per neuro IR   GI/GU -Gentle hydration  HEME -Monitor -transfuse for hgb < 7 -monitor PT/INR   ENDO . Type 2 diabetes mellitus with hyperglycemia   -SSI -goal HgbA1c < 7  ID Possible Aspiration PNA -CXR -NPO -Monitor  Possible UTI -Foley catheter inserted prior to TPA start -CX pending   Prophylaxis DVT: SCD GI: doc/senna Dispo: TBD  Diet: NPO until cleared by speech or bedside swallow eval  Code Status: Full Code   --please page stroke NP  Or  PA  Or MD from 8am -4 pm  as this patient from this time will be  followed by the stroke.   You can look them up on [email or web address]  Password [email or web address]     Laurey Morale, MSN, NP-C [facility] [phone]   03/23/2018, 6:21 PM   Attending physician note to follow with Assessment and plan .  I was present for the entire encounter documented above.  He had abrupt onset right-sided weakness and aphasia starting at 5:30 PM.  I discussed risks and benefits of IV TPA with the family who agreed with IV TPA.  He has extensive carotid and intracranial clot, possibilities include large vessel atherosclerosis with ruptured plaque versus embolic disease.   He is going for interventional radiology thrombectomy and will be admitted to the ICU.  Also he has a history of fevers with IV contrast and therefore I will medicate with Solu-Medrol/Benadryl for the allergy.  Further plan as above.  This patient is critically ill and at significant risk of neurological worsening, death and care requires constant monitoring of vital signs, hemodynamics,respiratory and cardiac monitoring, neurological assessment, discussion with family, other specialists and medical decision making of high complexity. I spent 65 minutes of neurocritical care time  in the care of  this patient.  This is independent of the nurse practitioner documenting above.  Roland Rack, MD [facility] [phone]  If 7pm- 7am, please page neurology on call as listed in [place]. 03/23/2018  8:24 PM

## 2018-03-23 NOTE — Progress Notes (Signed)
Patient ID: Terry Peck, male   DOB: 10/20/1940, 77 y.o.   MRN: [id number] INR. Post procedure dyna CT brain demonstrates no gross ICH ,mass effect of midline shift. Groin site soft. No hematoma noted. Distal pulses ,palpable DP and PT on the RT,and dopplerable DP and PT on the left unchanged. 8 F sheath left in situ to be removed in am. S.Nataley Bahri MD

## 2018-03-23 NOTE — ED Notes (Signed)
Blood draw X 2 unable to get labs. RN to pull from IV

## 2018-03-23 NOTE — Procedures (Signed)
S/P Lt common carotid arteriogram followed by complete revascularization of occluded Lt ICA  With x 1 pass with 5 mm x 4mm embotrap retriever devices and of Lt MCA sup division M 2 region  With x 1 pass with 70mm x 92mm embotrap retriver device achieving a TICI 3 revascularization.

## 2018-03-23 NOTE — Consult Note (Addendum)
NAME:  Terry Peck, MRN:  [id number], DOB:  [birth year], LOS: 0 ADMISSION DATE:  03/23/2018, CONSULTATION DATE:  03/23/2018 REFERRING MD:  Dr. Leonel Ramsay, CHIEF COMPLAINT:  CVA s/p Revascularization    History of present illness   77 year old male presents 12/1 with right sided weakness and aphasia. Last known normal 17:30. CT Head negative for hemorrhage. CTA with left M2 Occlusion. Given TPA @ 1834. Taken to IR for revascularization. PCCM consulted for vent management.   Past Medical History  HTN, DM, Pulmonary Fibrosis, HLD, Chronic diastolic heart failure (G1, EF 60-65)   Significant Hospital Events   12/1 > Presents to ED   Consults:  Neurology 12/1 PCCM 12/1   Procedures:  ETT 12/1 >>   Significant Diagnostic Tests:  CT Head 12/1 > 1. Hyperdense terminal left internal carotid artery and left MCA compatible with thrombosis. No evidence of acute infarct or Hemorrhage Atrophy and chronic microvascular ischemic changes in the white matter. CTA Head/Neck 12/1 > 1. 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 Reconstitution of the supraclinoid left internal carotid artery through the anterior communicating artery. There is clot in the left M1 and M2 segments with decreased blood flow. There is occlusion of the posterior division of the left middle cerebral artery due to Clot. 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 Moderate stenosis origin of left vertebral artery Probable fetal origin left posterior cerebral artery which is supplied from a small hypoplastic left P1 segment.  Micro Data:  U/A 12/1 >> Tracheal Asp 12/1 >>  Antimicrobials:  N/A    Interim history/subjective:    Objective   Blood pressure 125/78, pulse 76, resp. rate (!) 24, weight 75 kg, SpO2 100 %.        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 Sedation Needs  Plan  -Per Neurology  -RASS goal 0/-1 -Titrate Propofol to achieve RASS goal  -PRN Fentanyl  -MR Brain  HTN S/P Bi-V pacer/ICD, Followed by [facility]  H/O G1 Diastolic HF, HLD Plan  -Cardiac Monitoring -Wean Cleviprex to maintain systolic 852-778 -ECHO pending  -Continue home Lipitor  -Lipid panel pending  -Hold home Lasix, Cozaar, Coreg   Respiratory Insuffiencey in post-operative setting  Pulmonary Fibrosis on 2L [place] at baseline, Followed by Lenna Gilford  H/O Tobacco Use (5 pack year smoking history, quit 23 years ago)  Plan  -Wean Supplemental Oxygen to maintain saturation >90 -Vent Support: Plans for SBT in AM as neuro status allows  -Trend CXR/ABG > ABG 7.283/47/109, Rate Increased to 20, ABG in AM  -Pulmicort/Brovana -PRN Albuterol   Hyperkalemia  Plan -Repeat BMP now  -Obtain Mg and LA now   DM Plan  -Trend Glucose -SSI  -Hemoglobin A1C in AM   Concern for Aspiration Event Plan  -Trend WBC and Fever Curve -Send Tracheal Asp  -Monitor off Antibiotics   Best practice:  Diet: NPO Pain/Anxiety/Delirium  protocol VAP protocol DVT prophylaxis: SCD GI prophylaxis: PPI Glucose control: SSI Mobility: Bedrest Code Status: FC Family Communication: No family at bedside  Disposition: Continue ICU care   Labs   CBC: Recent Labs  Lab 03/23/18 1831 03/23/18 1839  WBC 7.8  --   NEUTROABS 4.8  --   HGB 14.3 15.3  HCT 45.2 45.0  MCV 94.0  --   PLT 208  --     Basic Metabolic Panel: Recent Labs  Lab 03/23/18 1831 03/23/18 1839  NA  132* 137  K 5.0 5.8*  CL 101 107  CO2 19*  --   GLUCOSE 154* 158*  BUN 24* 34*  CREATININE 1.24 1.10  CALCIUM 8.4*  --    GFR: Estimated Creatinine Clearance: 54.4 mL/min (by C-G formula based on SCr of 1.1 mg/dL). Recent Labs  Lab 03/23/18 1831  WBC 7.8    Liver Function Tests: Recent Labs  Lab 03/23/18 1831  AST 48*  ALT 28  ALKPHOS 33*  BILITOT 1.2  PROT 6.2*  ALBUMIN 3.1*   No results for input(s): LIPASE, AMYLASE in the last 168 hours. No results for input(s): AMMONIA in the last 168 hours.  ABG    Component Value Date/Time   TCO2 25 03/23/2018 1839     Coagulation Profile: Recent Labs  Lab 03/23/18 1831  INR 1.16    Cardiac Enzymes: No results for input(s): CKTOTAL, CKMB, CKMBINDEX, TROPONINI in the last 168 hours.  HbA1C: Hgb A1c MFr Bld  Date/Time Value Ref Range Status  11/26/2017 09:18 AM 7.6 (H) 4.6 - 6.5 % Final    Comment:    Glycemic Control Guidelines for People with Diabetes:Non Diabetic:  <6%Goal of Therapy: <7%Additional Action Suggested:  >8%   05/28/2017 09:51 AM 6.7 (H) 4.6 - 6.5 % Final    Comment:    Glycemic Control Guidelines for People with Diabetes:Non Diabetic:  <6%Goal of Therapy: <7%Additional Action Suggested:  >8%     CBG: Recent Labs  Lab 03/23/18 1828  GLUCAP 160*    Review of Systems:   Unable to review as patient is intubated/sedated   Past Medical History  He,  has a past medical history of Abnormal ANCA test (07/03/2011), Allergy, Arthritis, B12 deficiency anemia, Barrett's esophagus (11/23/2010), Bundle branch block, left, Cataract, CHF (congestive heart failure) ([place]), Chronic systolic dysfunction of left ventricle, Colon polyps (11/23/2010), GERD (gastroesophageal reflux disease), Hyperlipidemia (10/14/2012), Hypertension, ICD (implantable cardiac defibrillator) in place, Iron deficiency anemia (05/30/2011), Liver abscess (1980's), Nonischemic cardiomyopathy ([place]), Pulmonary fibrosis ([place]), Pulmonary nodules,  Shortness of breath, and Type II diabetes mellitus ([place]).   Surgical History    Past Surgical History:  Procedure Laterality Date  . abscess drained  ?1980's   liver  . BI-VENTRICULAR IMPLANTABLE CARDIOVERTER DEFIBRILLATOR N/A 01/29/2012   Procedure: BI-VENTRICULAR IMPLANTABLE CARDIOVERTER DEFIBRILLATOR  (CRT-D);  Surgeon: Thompson Grayer, MD;  Location: [facility] CATH LAB;  Service: Cardiovascular;  Laterality: N/A;  . CARDIAC DEFIBRILLATOR PLACEMENT  01/29/2012   SJM Quadra Assura BiV ICD implanted by Dr Rayann Heman  . CATARACT EXTRACTION Left   . COLONOSCOPY    . TONSILLECTOMY  1952  . UPPER GI ENDOSCOPY       Social History   reports that he quit smoking about 29 years ago. His smoking use included cigarettes. He has a 5.00 pack-year smoking history. He has never used smokeless tobacco. He reports that he does not drink alcohol or use drugs.   Family History   His family history includes Heart  disease in his mother. There is no history of Colon cancer, Esophageal cancer, or Stomach cancer.   Allergies Allergies  Allergen Reactions  . Penicillins Anaphylaxis and Other (See Comments)    "swelling of eyes; throat; could breath good" (01/29/2012)  . Iodine Other (See Comments)    "if I get iodine dye, I'll get a fever" (01/29/2012)  . Fish Oil Itching    Pt said only when given through IV     Home Medications  Prior to Admission medications   Medication Sig Start Date End Date Taking? Authorizing Provider  acetaminophen-codeine (TYLENOL #3) 300-30 MG tablet Take 1 tablet by mouth every 8 (eight) hours as needed for moderate pain. 02/25/16   Jaynee Eagles, PA-C  aspirin EC 81 MG tablet Take 81 mg by mouth daily.    [provider]  atorvastatin (LIPITOR) 20 MG tablet TAKE 1 TABLET BY MOUTH ONCE DAILY 12/30/17   Nahser, Wonda Cheng, MD  budesonide (PULMICORT) 180 MCG/ACT inhaler Inhale 2 puffs into the lungs 2 (two) times daily. 02/18/18   Noralee Space, MD  Calcium-Magnesium-Zinc 1000-400-15  MG TABS Take 1 tablet by mouth daily.     [provider]  carvedilol (COREG) 25 MG tablet TAKE 1 TABLET BY MOUTH TWICE DAILY WITH MEALS 02/12/18   Nahser, Wonda Cheng, MD  Cholecalciferol (VITAMIN D) 1000 UNITS capsule Take 1,000 Units by mouth daily.     [provider]  Cyanocobalamin (B-12 PO) Take 1 tablet by mouth daily.     [provider]  furosemide (LASIX) 40 MG tablet Take 1 tablet (40 mg total) by mouth daily. 04/24/17   Biagio Borg, MD  ibuprofen (ADVIL,MOTRIN) 200 MG tablet Take 200-300 mg by mouth 2 (two) times daily as needed. For pain    [provider]  Insulin Glargine (LANTUS SOLOSTAR) 100 UNIT/ML Solostar Pen INJECT 50 UNITS SUBCUTANEOUSLY ONCE DAILY 10/03/17   Biagio Borg, MD  Insulin Pen Needle (RELION PEN NEEDLES) 32G X 4 MM MISC Use as directed once daily E11.9 10/07/17   Biagio Borg, MD  losartan (COZAAR) 50 MG tablet Take 1 tablet (50 mg total) by mouth daily. 08/17/16 12/16/17  Nahser, Wonda Cheng, MD  Lutein 20 MG CAPS Take 20 mg by mouth daily.    [provider]  metFORMIN (GLUCOPHAGE-XR) 500 MG 24 hr tablet Take 4 tablets (2,000 mg total) by mouth daily with breakfast. 11/26/17   Biagio Borg, MD  methocarbamol (ROBAXIN) 500 MG tablet TAKE ONE TABLET BY MOUTH ONCE DAILY AS NEEDED 12/18/16   Biagio Borg, MD  Multiple Vitamin (MULTIVITAMIN WITH MINERALS) TABS Take 1 tablet by mouth daily.    [provider]  OMEGA 3 1000 MG CAPS Take 1,000 mg by mouth 2 (two) times daily.     [provider]  omeprazole (PRILOSEC) 20 MG capsule TAKE 1 CAPSULE BY MOUTH TWICE DAILY 02/11/18   Biagio Borg, MD  pioglitazone (ACTOS) 30 MG tablet TAKE 1 TABLET BY MOUTH ONCE DAILY 12/02/17   Biagio Borg, MD     Critical care time: 42 minutes     Hayden Pedro, AGACNP-BC [facility]  PCCM Pgr: [phone]    Patient seen and examined, agree with above note.  I dictated the care and orders written for  this patient under my direction.  Roxanne Mins, [place]

## 2018-03-23 NOTE — Anesthesia Preprocedure Evaluation (Addendum)
Anesthesia Evaluation  Patient identified by MRN, date of birth, ID band Patient awake    Reviewed: Allergy & Precautions, H&P , NPO status , Patient's Chart, lab work & pertinent test results  Airway Mallampati: II   Neck ROM: full    Dental  (+) Poor Dentition, Chipped, Missing   Pulmonary shortness of breath, former smoker,    breath sounds clear to auscultation       Cardiovascular hypertension, +CHF  + dysrhythmias + Cardiac Defibrillator  Rhythm:regular Rate:Normal     Neuro/Psych Right side weakness, facial droop, aphasia CVA, Residual Symptoms    GI/Hepatic GERD  ,  Endo/Other  diabetes, Type 2  Renal/GU      Musculoskeletal  (+) Arthritis ,   Abdominal   Peds  Hematology   Anesthesia Other Findings   Reproductive/Obstetrics                            Anesthesia Physical Anesthesia Plan  ASA: IV and emergent  Anesthesia Plan: General   Post-op Pain Management:    Induction: Intravenous  PONV Risk Score and Plan: 2 and Ondansetron, Dexamethasone and Treatment may vary due to age or medical condition  Airway Management Planned: Oral ETT  Additional Equipment: Arterial line  Intra-op Plan:   Post-operative Plan: Extubation in OR and Possible Post-op intubation/ventilation  Informed Consent: I have reviewed the patients History and Physical, chart, labs and discussed the procedure including the risks, benefits and alternatives for the proposed anesthesia with the patient or authorized representative who has indicated his/her understanding and acceptance.     Plan Discussed with: CRNA, Anesthesiologist and Surgeon  Anesthesia Plan Comments:         Anesthesia Quick Evaluation

## 2018-03-23 NOTE — ED Provider Notes (Signed)
[facility] EMERGENCY DEPARTMENT Provider Note   CSN: [id number] Arrival date & time: 03/23/18  1814     History   Chief Complaint Chief Complaint  Patient presents with  . Code Stroke    HPI Terry Peck is a 77 y.o. male.  Pt presents to the ED today as a code stroke.  Pt was LSN at 1730 when wife heard a thump and found him on the ground.   The wife found him and noted that he could not move his right side.  She called EMS.  EMS called a code stroke and the stroke team met him at the bridge.  Also, he was hypoxic (77% RA).  He does have a hx of pulmonary fibrosis and normally wears 2L oxygen.  The pt is unable to give much hx.  Pt put on a 100% NRB by EMS.     Past Medical History:  Diagnosis Date  . Abnormal ANCA test 07/03/2011  . Allergy   . Arthritis    "mild; right shoulder" (01/29/2012)  . B12 deficiency anemia   . Barrett's esophagus 11/23/2010  . Bundle branch block, left   . Cataract    L eye surgery  . CHF (congestive heart failure) ([place])    pacemaker  . Chronic systolic dysfunction of left ventricle    EF 30%  . Colon polyps 11/23/2010  . GERD (gastroesophageal reflux disease)   . Hyperlipidemia 10/14/2012  . Hypertension   . ICD (implantable cardiac defibrillator) in place   . Iron deficiency anemia 05/30/2011  . Liver abscess 1980's  . Nonischemic cardiomyopathy ([place])    s/p St. Jude BiV ICD 01/29/12  . Pulmonary fibrosis ([place])   . Pulmonary nodules   . Shortness of breath    "related to heart being out of rhythm" (01/29/2012)  . Type II diabetes mellitus [facility])     Patient Active Problem List   Diagnosis Date Noted  . Stroke ([place]) 03/23/2018  . Type 2 diabetes mellitus with complication, with long-term current use of insulin ([place]) 02/18/2017  . Restrictive lung disease 08/16/2016  . Nocturnal hypoxemia 08/16/2016  . Exercise hypoxemia 08/16/2016  . History of iron deficiency anemia 08/16/2016  . Symptomatic anemia 11/17/2014    . Solitary pulmonary nodule 03/12/2014  . Hyperlipidemia 10/14/2012  . Automatic implantable cardioverter-defibrillator in situ 01/31/2012  . Overweight (BMI 25.0-29.9) 09/10/2011  . Chronic systolic heart failure ([place]) 06/28/2011  . Left bundle branch block 06/28/2011  . IPF (idiopathic pulmonary fibrosis) ([place]) 06/25/2011  . Abnormal echocardiogram 06/20/2011  . Iron deficiency anemia 05/30/2011  . B12 deficiency 05/30/2011  . DOE (dyspnea on exertion) 05/29/2011  . Shoulder pain, right 05/29/2011  . Abnormal ECG 05/29/2011  . Preventative health care 11/23/2010  . Colon polyps 11/23/2010  . Barrett's esophagus 11/23/2010  . Allergy   . Arthritis   . Diabetes ([place])   . GERD (gastroesophageal reflux disease)   . Hypertensive cardiovascular disease     Past Surgical History:  Procedure Laterality Date  . abscess drained  ?1980's   liver  . BI-VENTRICULAR IMPLANTABLE CARDIOVERTER DEFIBRILLATOR N/A 01/29/2012   Procedure: BI-VENTRICULAR IMPLANTABLE CARDIOVERTER DEFIBRILLATOR  (CRT-D);  Surgeon: Thompson Grayer, MD;  Location: [facility] CATH LAB;  Service: Cardiovascular;  Laterality: N/A;  . CARDIAC DEFIBRILLATOR PLACEMENT  01/29/2012   SJM Quadra Assura BiV ICD implanted by Dr Rayann Heman  . CATARACT EXTRACTION Left   . COLONOSCOPY    . TONSILLECTOMY  1952  . UPPER GI ENDOSCOPY  Home Medications    Prior to Admission medications   Medication Sig Start Date End Date Taking? Authorizing Provider  acetaminophen-codeine (TYLENOL #3) 300-30 MG tablet Take 1 tablet by mouth every 8 (eight) hours as needed for moderate pain. 02/25/16   Jaynee Eagles, PA-C  aspirin EC 81 MG tablet Take 81 mg by mouth daily.    [provider]  atorvastatin (LIPITOR) 20 MG tablet TAKE 1 TABLET BY MOUTH ONCE DAILY 12/30/17   Nahser, Wonda Cheng, MD  budesonide (PULMICORT) 180 MCG/ACT inhaler Inhale 2 puffs into the lungs 2 (two) times daily. 02/18/18   Noralee Space, MD  Calcium-Magnesium-Zinc  1000-400-15 MG TABS Take 1 tablet by mouth daily.     [provider]  carvedilol (COREG) 25 MG tablet TAKE 1 TABLET BY MOUTH TWICE DAILY WITH MEALS 02/12/18   Nahser, Wonda Cheng, MD  Cholecalciferol (VITAMIN D) 1000 UNITS capsule Take 1,000 Units by mouth daily.     [provider]  Cyanocobalamin (B-12 PO) Take 1 tablet by mouth daily.     [provider]  furosemide (LASIX) 40 MG tablet Take 1 tablet (40 mg total) by mouth daily. 04/24/17   Biagio Borg, MD  ibuprofen (ADVIL,MOTRIN) 200 MG tablet Take 200-300 mg by mouth 2 (two) times daily as needed. For pain    [provider]  Insulin Glargine (LANTUS SOLOSTAR) 100 UNIT/ML Solostar Pen INJECT 50 UNITS SUBCUTANEOUSLY ONCE DAILY 10/03/17   Biagio Borg, MD  Insulin Pen Needle (RELION PEN NEEDLES) 32G X 4 MM MISC Use as directed once daily E11.9 10/07/17   Biagio Borg, MD  losartan (COZAAR) 50 MG tablet Take 1 tablet (50 mg total) by mouth daily. 08/17/16 12/16/17  Nahser, Wonda Cheng, MD  Lutein 20 MG CAPS Take 20 mg by mouth daily.    [provider]  metFORMIN (GLUCOPHAGE-XR) 500 MG 24 hr tablet Take 4 tablets (2,000 mg total) by mouth daily with breakfast. 11/26/17   Biagio Borg, MD  methocarbamol (ROBAXIN) 500 MG tablet TAKE ONE TABLET BY MOUTH ONCE DAILY AS NEEDED 12/18/16   Biagio Borg, MD  Multiple Vitamin (MULTIVITAMIN WITH MINERALS) TABS Take 1 tablet by mouth daily.    [provider]  OMEGA 3 1000 MG CAPS Take 1,000 mg by mouth 2 (two) times daily.     [provider]  omeprazole (PRILOSEC) 20 MG capsule TAKE 1 CAPSULE BY MOUTH TWICE DAILY 02/11/18   Biagio Borg, MD  pioglitazone (ACTOS) 30 MG tablet TAKE 1 TABLET BY MOUTH ONCE DAILY 12/02/17   Biagio Borg, MD    Family History Family History  Problem Relation Age of Onset  . Heart disease Mother   . Colon cancer Neg Hx   . Esophageal cancer Neg Hx   . Stomach cancer Neg Hx     Social History Social History    Tobacco Use  . Smoking status: Former Smoker    Packs/day: 0.50    Years: 10.00    Pack years: 5.00    Types: Cigarettes    Last attempt to quit: 04/23/1988    Years since quitting: 29.9  . Smokeless tobacco: Never Used  Substance Use Topics  . Alcohol use: No    Alcohol/week: 0.0 standard drinks  . Drug use: No     Allergies   Penicillins; Iodine; and Fish oil   Review of Systems Review of Systems  Neurological:       Right sided weakness  All other systems reviewed and are negative.    Physical Exam Updated Vital Signs Wt 75 kg   BMI 25.14 kg/m   Physical Exam  Constitutional: He appears well-developed and well-nourished.  HENT:  Right Ear: External ear normal.  Left Ear: External ear normal.  Nose: Nose normal.  Mouth/Throat: Oropharynx is clear and moist.  Eyes: Pupils are equal, round, and reactive to light. Conjunctivae and EOM are normal.  Left side neglect Right side gaze  Neck: Normal range of motion. Neck supple.  Cardiovascular: Normal rate, regular rhythm, normal heart sounds and intact distal pulses.  Pulmonary/Chest: Effort normal and breath sounds normal.  Abdominal: Soft. Bowel sounds are normal.  Neurological: He is alert.  Right sided paralysis  Skin: Skin is warm. Capillary refill takes less than 2 seconds.  Psychiatric: He has a normal mood and affect.  Nursing note and vitals reviewed.    ED Treatments / Results  Labs (all labs ordered are listed, but only abnormal results are displayed) Labs Reviewed  CBG MONITORING, ED - Abnormal; Notable for the following components:      Result Value   Glucose-Capillary 160 (*)    All other components within normal limits  I-STAT CHEM 8, ED - Abnormal; Notable for the following components:   Potassium 5.8 (*)    BUN 34 (*)    Glucose, Bld 158 (*)    Calcium, Ion 1.05 (*)    All other components within normal limits  PROTIME-INR  APTT  CBC  DIFFERENTIAL  COMPREHENSIVE METABOLIC PANEL   HEMOGLOBIN A1C  LIPID PANEL  I-STAT TROPONIN, ED    EKG None  Radiology Ct Angio Head W Or Wo Contrast  Result Date: 03/23/2018 CLINICAL DATA:  Right-sided weakness, aphasia EXAM: CT ANGIOGRAPHY HEAD AND NECK TECHNIQUE: Multidetector CT imaging of the head and neck was performed using the standard protocol during bolus administration of intravenous contrast. Multiplanar CT image reconstructions and MIPs were obtained to evaluate the vascular anatomy. Carotid stenosis measurements (when applicable) are obtained utilizing NASCET criteria, using the distal internal carotid diameter as the denominator. CONTRAST:  14m ISOVUE-370 IOPAMIDOL (ISOVUE-370) INJECTION 76% COMPARISON:  CT head 03/23/2018 FINDINGS: CTA NECK FINDINGS Aortic arch: Atherosclerotic aortic arch without aneurysm. Mild atherosclerotic disease and tortuosity in the proximal great vessels without significant stenosis. Right carotid system: Scattered atherosclerotic disease in the right common carotid artery. Atherosclerotic calcified plaque in the right carotid bifurcation. 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 Left carotid system: Scattered atherosclerotic disease left common carotid artery without significant stenosis. Atherosclerotic calcification left carotid bifurcation. There is decreased flow in the left common carotid artery due to acute occlusion of the proximal left internal carotid artery with thrombus present. There is no contrast distal to the proximal left internal carotid artery which is thrombosed. Left internal carotid artery is occluded to the supraclinoid segment. Vertebral arteries: Left vertebral artery dominant with moderate stenosis at the origin. Right vertebral non dominant without significant stenosis. Skeleton: Cervical spondylosis.  No acute skeletal abnormality. Other neck: Negative for mass or adenopathy in the neck. Upper chest: Transvenous pacemaker left. Interstitial fibrosis of the  lungs bilaterally. No pleural effusion. Review of the MIP images confirms the above findings CTA HEAD FINDINGS Anterior circulation: Left internal carotid artery is occluded through the cavernous segment with reconstitution of the supraclinoid segment via flow from the anterior communicating artery. There is clot throughout the left M1 segment with slow flow. There is a small amount of flow in left  M2 branches with occlusion of the posterior division of left M2. Both anterior cerebral arteries are patent. Right middle cerebral artery patent without stenosis. Atherosclerotic plaque in the right cavernous carotid with mild stenosis. Posterior circulation: Both vertebral arteries patent to the basilar. Left vertebral dominant. Right PICA patent. Left PICA not visualized. AICA patent bilaterally. Basilar widely patent. Superior cerebellar artery patent. Right posterior cerebral artery patent. Probable fetal origin of the left posterior cerebral artery with supply from a hypoplastic left P1 segment. Venous sinuses: Patent Anatomic variants: None Delayed phase: Not performed Review of the MIP images confirms the above findings IMPRESSION: 1. 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 Reconstitution of the supraclinoid left internal carotid artery through the anterior communicating artery. There is clot in the left M1 and M2 segments with decreased blood flow. There is occlusion of the posterior division of the left middle cerebral artery due to clot. 2. 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 3. Moderate stenosis origin of left vertebral artery 4. Probable fetal origin left posterior cerebral artery which is supplied from a small hypoplastic left P1 segment. 5. These results were called by telephone at the time of interpretation on 03/23/2018 at 6:33 pm to Dr. Leonel Ramsay , who verbally acknowledged these results. Electronically Signed   By: Franchot Gallo M.D.   On: 03/23/2018 18:54     Ct Angio Neck W Or Wo Contrast  Result Date: 03/23/2018 CLINICAL DATA:  Right-sided weakness, aphasia EXAM: CT ANGIOGRAPHY HEAD AND NECK TECHNIQUE: Multidetector CT imaging of the head and neck was performed using the standard protocol during bolus administration of intravenous contrast. Multiplanar CT image reconstructions and MIPs were obtained to evaluate the vascular anatomy. Carotid stenosis measurements (when applicable) are obtained utilizing NASCET criteria, using the distal internal carotid diameter as the denominator. CONTRAST:  83m ISOVUE-370 IOPAMIDOL (ISOVUE-370) INJECTION 76% COMPARISON:  CT head 03/23/2018 FINDINGS: CTA NECK FINDINGS Aortic arch: Atherosclerotic aortic arch without aneurysm. Mild atherosclerotic disease and tortuosity in the proximal great vessels without significant stenosis. Right carotid system: Scattered atherosclerotic disease in the right common carotid artery. Atherosclerotic calcified plaque in the right carotid bifurcation. 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 Left carotid system: Scattered atherosclerotic disease left common carotid artery without significant stenosis. Atherosclerotic calcification left carotid bifurcation. There is decreased flow in the left common carotid artery due to acute occlusion of the proximal left internal carotid artery with thrombus present. There is no contrast distal to the proximal left internal carotid artery which is thrombosed. Left internal carotid artery is occluded to the supraclinoid segment. Vertebral arteries: Left vertebral artery dominant with moderate stenosis at the origin. Right vertebral non dominant without significant stenosis. Skeleton: Cervical spondylosis.  No acute skeletal abnormality. Other neck: Negative for mass or adenopathy in the neck. Upper chest: Transvenous pacemaker left. Interstitial fibrosis of the lungs bilaterally. No pleural effusion. Review of the MIP images confirms the  above findings CTA HEAD FINDINGS Anterior circulation: Left internal carotid artery is occluded through the cavernous segment with reconstitution of the supraclinoid segment via flow from the anterior communicating artery. There is clot throughout the left M1 segment with slow flow. There is a small amount of flow in left M2 branches with occlusion of the posterior division of left M2. Both anterior cerebral arteries are patent. Right middle cerebral artery patent without stenosis. Atherosclerotic plaque in the right cavernous carotid with mild stenosis. Posterior circulation: Both vertebral arteries patent to the basilar. Left vertebral dominant.  Right PICA patent. Left PICA not visualized. AICA patent bilaterally. Basilar widely patent. Superior cerebellar artery patent. Right posterior cerebral artery patent. Probable fetal origin of the left posterior cerebral artery with supply from a hypoplastic left P1 segment. Venous sinuses: Patent Anatomic variants: None Delayed phase: Not performed Review of the MIP images confirms the above findings IMPRESSION: 1. 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 Reconstitution of the supraclinoid left internal carotid artery through the anterior communicating artery. There is clot in the left M1 and M2 segments with decreased blood flow. There is occlusion of the posterior division of the left middle cerebral artery due to clot. 2. 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 3. Moderate stenosis origin of left vertebral artery 4. Probable fetal origin left posterior cerebral artery which is supplied from a small hypoplastic left P1 segment. 5. These results were called by telephone at the time of interpretation on 03/23/2018 at 6:33 pm to Dr. Leonel Ramsay , who verbally acknowledged these results. Electronically Signed   By: Franchot Gallo M.D.   On: 03/23/2018 18:54   Ct Head Code Stroke Wo Contrast  Result Date: 03/23/2018 CLINICAL DATA:   Code stroke.  Right-sided weakness, aphasia EXAM: CT HEAD WITHOUT CONTRAST TECHNIQUE: Contiguous axial images were obtained from the base of the skull through the vertex without intravenous contrast. COMPARISON:  None. FINDINGS: Brain: Moderate atrophy. Patchy hypodensity in the cerebral white matter bilaterally compatible with mild chronic microvascular ischemia. 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 Negative for acute hemorrhage.  No acute infarct or mass. Vascular: Hyperdense left MCA. Hyperdense terminal left internal carotid artery. Atherosclerotic calcification cavernous carotid bilaterally. Skull: Negative Sinuses/Orbits: Paranasal sinuses clear. Bilateral cataract surgery. Other: None ASPECTS ([place] Stroke Program Early CT Score) - Ganglionic level infarction (caudate, lentiform nuclei, internal capsule, insula, M1-M3 cortex): 7 - Supraganglionic infarction (M4-M6 cortex): 3 Total score (0-10 with 10 being normal): 10 IMPRESSION: 1. Hyperdense terminal left internal carotid artery and left MCA compatible with thrombosis. No evidence of acute infarct or hemorrhage 2. Atrophy and chronic microvascular ischemic changes in the white matter. 3. ASPECTS is 10 4. These results were called by telephone at the time of interpretation on 03/23/2018 at 6:33 pm to Dr. Leonel Ramsay, who verbally acknowledged these results. Electronically Signed   By: Franchot Gallo M.D.   On: 03/23/2018 18:35    Procedures Procedures (including critical care time)  Medications Ordered in ED Medications  alteplase (ACTIVASE) 1 mg/mL infusion 67.5 mg (has no administration in time range)    Followed by  0.9 %  sodium chloride infusion (has no administration in time range)   stroke: mapping our early stages of recovery book (has no administration in time range)  0.9 %  sodium chloride infusion (has no administration in time range)  acetaminophen (TYLENOL) tablet 650 mg (has no administration in  time range)    Or  acetaminophen (TYLENOL) solution 650 mg (has no administration in time range)    Or  acetaminophen (TYLENOL) suppository 650 mg (has no administration in time range)  senna-docusate (Senokot-S) tablet 1 tablet (has no administration in time range)  pantoprazole (PROTONIX) injection 40 mg (has no administration in time range)  labetalol (NORMODYNE,TRANDATE) injection 20 mg (has no administration in time range)    And  clevidipine (CLEVIPREX) infusion 0.5 mg/mL (has no administration in time range)  insulin aspart (novoLOG) injection 0-9 Units (has no administration in time range)  insulin  aspart (novoLOG) injection 0-5 Units (has no administration in time range)  budesonide (PULMICORT) 180 MCG/ACT inhaler 2 puff (has no administration in time range)  diphenhydrAMINE (BENADRYL) injection 50 mg (has no administration in time range)  methylPREDNISolone sodium succinate (SOLU-MEDROL) 125 mg/2 mL injection 125 mg (has no administration in time range)  fentaNYL (SUBLIMAZE) 100 MCG/2ML injection (has no administration in time range)  iopamidol (ISOVUE-370) 76 % injection 50 mL (50 mLs Intravenous Contrast Given 03/23/18 1827)     Initial Impression / Assessment and Plan / ED Course  I have reviewed the triage vital signs and the nursing notes.  Pertinent labs & imaging results that were available during my care of the patient were reviewed by me and considered in my medical decision making (see chart for details).     Pt does have a clot in his left ICA and left MCA.  He was given tpa in the CT scanner.  Dr. Estanislado Pandy will take him in IR to try to remove the clot.  The pt will be intubated for that procedure, so I did not want to delay any of his important neurological care with CXR and other hypoxia work up.  His airway will be stable with intubation.  Dr. Leonel Ramsay (neurology) will admit.  CRITICAL CARE Performed by: Isla Pence   Total critical care time: 30   minutes  Critical care time was exclusive of separately billable procedures and treating other patients.  Critical care was necessary to treat or prevent imminent or life-threatening deterioration.  Critical care was time spent personally by me on the following activities: development of treatment plan with patient and/or surrogate as well as nursing, discussions with consultants, evaluation of patient's response to treatment, examination of patient, obtaining history from patient or surrogate, ordering and performing treatments and interventions, ordering and review of laboratory studies, ordering and review of radiographic studies, pulse oximetry and re-evaluation of patient's condition.  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

## 2018-03-23 NOTE — ED Notes (Signed)
Blood tubed to main lab. 

## 2018-03-24 ENCOUNTER — Encounter (HOSPITAL_COMMUNITY): Payer: Self-pay | Admitting: Radiology

## 2018-03-24 ENCOUNTER — Encounter (HOSPITAL_COMMUNITY): Payer: Self-pay | Admitting: Interventional Radiology

## 2018-03-24 ENCOUNTER — Inpatient Hospital Stay (HOSPITAL_COMMUNITY): Payer: Medicare Other

## 2018-03-24 DIAGNOSIS — J9589 Other postprocedural complications and disorders of respiratory system, not elsewhere classified: Secondary | ICD-10-CM

## 2018-03-24 DIAGNOSIS — Z950 Presence of cardiac pacemaker: Secondary | ICD-10-CM

## 2018-03-24 DIAGNOSIS — E785 Hyperlipidemia, unspecified: Secondary | ICD-10-CM

## 2018-03-24 DIAGNOSIS — I63032 Cerebral infarction due to thrombosis of left carotid artery: Secondary | ICD-10-CM

## 2018-03-24 DIAGNOSIS — Z01818 Encounter for other preprocedural examination: Secondary | ICD-10-CM

## 2018-03-24 DIAGNOSIS — E1159 Type 2 diabetes mellitus with other circulatory complications: Secondary | ICD-10-CM

## 2018-03-24 DIAGNOSIS — I509 Heart failure, unspecified: Secondary | ICD-10-CM

## 2018-03-24 LAB — BASIC METABOLIC PANEL
Anion gap: 10 (ref 5–15)
BUN: 23 mg/dL (ref 8–23)
CHLORIDE: 108 mmol/L (ref 98–111)
CO2: 20 mmol/L — ABNORMAL LOW (ref 22–32)
Calcium: 8.3 mg/dL — ABNORMAL LOW (ref 8.9–10.3)
Creatinine, Ser: 0.9 mg/dL (ref 0.61–1.24)
GFR calc Af Amer: >60 mL/min (ref >60)
GFR calc non Af Amer: >60 mL/min (ref >60)
Glucose, Bld: 195 mg/dL — ABNORMAL HIGH (ref 70–99)
Potassium: 4.2 mmol/L (ref 3.5–5.1)
Sodium: 138 mmol/L (ref 135–145)

## 2018-03-24 LAB — POCT I-STAT 3, ART BLOOD GAS (G3+)
Acid-base deficit: 7 mmol/L — ABNORMAL HIGH (ref 0.0–2.0)
Bicarbonate: 17.7 mmol/L — ABNORMAL LOW (ref 20.0–28.0)
O2 Saturation: 97 %
TCO2: 19 mmol/L — ABNORMAL LOW (ref 22–32)
pCO2 arterial: 32 mmHg (ref 32.0–48.0)
pH, Arterial: 7.348 — ABNORMAL LOW (ref 7.350–7.450)
pO2, Arterial: 96 mmHg (ref 83.0–108.0)

## 2018-03-24 LAB — MRSA PCR SCREENING: MRSA by PCR: NEGATIVE

## 2018-03-24 LAB — CBC WITH DIFFERENTIAL/PLATELET
ABS IMMATURE GRANULOCYTES: 0.03 10*3/uL (ref 0.00–0.07)
Basophils Absolute: 0 10*3/uL (ref 0.0–0.1)
Basophils Relative: 0 %
Eosinophils Absolute: 0 10*3/uL (ref 0.0–0.5)
Eosinophils Relative: 0 %
HCT: 41 % (ref 39.0–52.0)
Hemoglobin: 12.4 g/dL — ABNORMAL LOW (ref 13.0–17.0)
IMMATURE GRANULOCYTES: 0 %
Lymphocytes Relative: 6 %
Lymphs Abs: 0.6 10*3/uL — ABNORMAL LOW (ref 0.7–4.0)
MCH: 28.7 pg (ref 26.0–34.0)
MCHC: 30.2 g/dL (ref 30.0–36.0)
MCV: 94.9 fL (ref 80.0–100.0)
MONOS PCT: 1 %
Monocytes Absolute: 0.1 10*3/uL (ref 0.1–1.0)
Neutro Abs: 8.9 10*3/uL — ABNORMAL HIGH (ref 1.7–7.7)
Neutrophils Relative %: 93 %
Platelets: 232 10*3/uL (ref 150–400)
RBC: 4.32 MIL/uL (ref 4.22–5.81)
RDW: 15.2 % (ref 11.5–15.5)
WBC: 9.7 10*3/uL (ref 4.0–10.5)
nRBC: 0 % (ref 0.0–0.2)

## 2018-03-24 LAB — LIPID PANEL
CHOLESTEROL: 95 mg/dL (ref 0–200)
HDL: 28 mg/dL — ABNORMAL LOW (ref >40)
LDL Cholesterol: 47 mg/dL (ref 0–99)
Total CHOL/HDL Ratio: 3.4 RATIO
Triglycerides: 99 mg/dL (ref <150)
VLDL: 20 mg/dL (ref 0–40)

## 2018-03-24 LAB — GLUCOSE, CAPILLARY
Glucose-Capillary: 177 mg/dL — ABNORMAL HIGH (ref 70–99)
Glucose-Capillary: 151 mg/dL — ABNORMAL HIGH (ref 70–99)
Glucose-Capillary: 99 mg/dL (ref 70–99)
Glucose-Capillary: 104 mg/dL — ABNORMAL HIGH (ref 70–99)
Glucose-Capillary: 102 mg/dL — ABNORMAL HIGH (ref 70–99)

## 2018-03-24 LAB — PHOSPHORUS: Phosphorus: 3.4 mg/dL (ref 2.5–4.6)

## 2018-03-24 LAB — HEMOGLOBIN A1C
Hgb A1c MFr Bld: 7 % — ABNORMAL HIGH (ref 4.8–5.6)
Mean Plasma Glucose: 154.2 mg/dL

## 2018-03-24 LAB — MAGNESIUM: Magnesium: 2 mg/dL (ref 1.7–2.4)

## 2018-03-24 MED ORDER — CARVEDILOL 12.5 MG PO TABS
12.5000 mg | ORAL_TABLET | Freq: Two times a day (BID) | ORAL | Status: DC
  Administered 2018-03-25 – 2018-04-04 (×14): 12.5 mg via ORAL
  Filled 2018-03-24 (×15): qty 1

## 2018-03-24 MED ORDER — ASPIRIN EC 325 MG PO TBEC
325.0000 mg | DELAYED_RELEASE_TABLET | Freq: Every day | ORAL | Status: DC
  Administered 2018-03-31 – 2018-04-04 (×5): 325 mg via ORAL
  Filled 2018-03-24 (×6): qty 1

## 2018-03-24 MED ORDER — CHLORHEXIDINE GLUCONATE 0.12% ORAL RINSE (MEDLINE KIT)
15.0000 mL | Freq: Two times a day (BID) | OROMUCOSAL | Status: DC
  Administered 2018-03-23 – 2018-03-30 (×13): 15 mL via OROMUCOSAL

## 2018-03-24 MED ORDER — ASPIRIN 300 MG RE SUPP
300.0000 mg | Freq: Every day | RECTAL | Status: DC
  Administered 2018-03-25 – 2018-03-30 (×6): 300 mg via RECTAL
  Filled 2018-03-24 (×7): qty 1

## 2018-03-24 MED ORDER — CARVEDILOL 12.5 MG PO TABS: 25.0000 mg | ORAL_TABLET | Freq: Two times a day (BID) | ORAL | Status: DC

## 2018-03-24 MED ORDER — CLEVIDIPINE BUTYRATE 0.5 MG/ML IV EMUL: 0.0000 mg/h | INTRAVENOUS | Status: DC

## 2018-03-24 MED ORDER — MAGNESIUM SULFATE 2 GM/50ML IV SOLN
2.0000 g | Freq: Once | INTRAVENOUS | Status: AC
  Administered 2018-03-24: 2 g via INTRAVENOUS
  Filled 2018-03-24: qty 50

## 2018-03-24 MED ORDER — ORAL CARE MOUTH RINSE
15.0000 mL | OROMUCOSAL | Status: DC
  Administered 2018-03-24 – 2018-03-30 (×55): 15 mL via OROMUCOSAL

## 2018-03-24 NOTE — Progress Notes (Signed)
Spoke with MRI. Patient's pacemaker is not able to go in MRI per Aaron Edelman with [facility]. Ivin Booty, NP was notified. MRI orders were discontinued and CT is scheduled for this afternoon at the 24 hour mark post TPA.

## 2018-03-24 NOTE — Progress Notes (Signed)
SLP Cancellation Note  Patient Details Name: Terry Peck MRN: [id number] DOB: Nov 18, 1940   Cancelled treatment:       Reason Eval/Treat Not Completed: Patient at procedure or test/unavailable   Carolene Gitto, Katherene Ponto 03/24/2018, 12:28 PM

## 2018-03-24 NOTE — Progress Notes (Signed)
Neuro IR Rt 8 french femoral sheath removed at 1123.  Sheath was intact.  VPAD and manual pressure applied to achieve hemostasis at 1150.  Distal pulses  Intact.  No acute complications.  Pressure dressing applied.  Site reviewed with Corrie Dandy.   [facility]

## 2018-03-24 NOTE — Progress Notes (Signed)
Referring Physician(s): CODE STROKE- Greta Doom  Supervising Physician: Luanne Bras  Patient Status:  [facility] 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 Estanislado Pandy. Patient laying in bed intubated and sedated. Can spontaneously move right side and left leg, but no spontaneous movements of left arm. Right groin incision c/d/i with sheath in place.   Allergies: Penicillins; Iodine; and Fish oil  Medications: Prior to Admission medications   Medication Sig Start Date End Date Taking? Authorizing Provider  acetaminophen-codeine (TYLENOL #3) 300-30 MG tablet Take 1 tablet by mouth every 8 (eight) hours as needed for moderate pain. 02/25/16   Jaynee Eagles, PA-C  aspirin EC 81 MG tablet Take 81 mg by mouth daily.    [provider]  atorvastatin (LIPITOR) 20 MG tablet TAKE 1 TABLET BY MOUTH ONCE DAILY 12/30/17   Nahser, Wonda Cheng, MD  budesonide (PULMICORT) 180 MCG/ACT inhaler Inhale 2 puffs into the lungs 2 (two) times daily. 02/18/18   Noralee Space, MD  Calcium-Magnesium-Zinc 1000-400-15 MG TABS Take 1 tablet by mouth daily.     [provider]  carvedilol (COREG) 25 MG tablet TAKE 1 TABLET BY MOUTH TWICE DAILY WITH MEALS 02/12/18   Nahser, Wonda Cheng, MD  Cholecalciferol (VITAMIN D) 1000 UNITS capsule Take 1,000 Units by mouth daily.     [provider]  Cyanocobalamin (B-12 PO) Take 1 tablet by mouth daily.     [provider]  furosemide (LASIX) 40 MG tablet Take 1 tablet (40 mg total) by mouth daily. 04/24/17   Biagio Borg, MD  ibuprofen (ADVIL,MOTRIN) 200 MG tablet Take 200-300 mg by mouth 2 (two) times daily as needed. For pain    [provider]  Insulin Glargine (LANTUS SOLOSTAR) 100 UNIT/ML Solostar Pen INJECT 50 UNITS SUBCUTANEOUSLY ONCE DAILY 10/03/17   Biagio Borg,  MD  Insulin Pen Needle (RELION PEN NEEDLES) 32G X 4 MM MISC Use as directed once daily E11.9 10/07/17   Biagio Borg, MD  losartan (COZAAR) 50 MG tablet Take 1 tablet (50 mg total) by mouth daily. 08/17/16 12/16/17  Nahser, Wonda Cheng, MD  Lutein 20 MG CAPS Take 20 mg by mouth daily.    [provider]  metFORMIN (GLUCOPHAGE-XR) 500 MG 24 hr tablet Take 4 tablets (2,000 mg total) by mouth daily with breakfast. 11/26/17   Biagio Borg, MD  methocarbamol (ROBAXIN) 500 MG tablet TAKE ONE TABLET BY MOUTH ONCE DAILY AS NEEDED 12/18/16   Biagio Borg, MD  Multiple Vitamin (MULTIVITAMIN WITH MINERALS) TABS Take 1 tablet by mouth daily.    [provider]  OMEGA 3 1000 MG CAPS Take 1,000 mg by mouth 2 (two) times daily.     [provider]  omeprazole (PRILOSEC) 20 MG capsule TAKE 1 CAPSULE BY MOUTH TWICE DAILY 02/11/18   Biagio Borg, MD  pioglitazone (ACTOS) 30 MG tablet TAKE 1 TABLET BY MOUTH ONCE DAILY 12/02/17   Biagio Borg, MD     Vital Signs: BP (!) 123/55   Pulse 71   Temp 98.8 F (37.1 C) (Axillary)   Resp (!) 25   Ht 5\' 7"  (1.702 m)   Wt 165 lb 9.1 oz (75.1 kg)   SpO2 97%   BMI 25.93 kg/m   Physical Exam  Constitutional: He appears well-developed and well-nourished. No distress.  Intubated  and sedated.  Pulmonary/Chest: Effort normal. No respiratory distress.  Intubated and sedated.  Neurological:  Intubated and sedated. Speech and comprehension not assessed. PERRL bilaterally. EOMs not assessed. Visual fields not assessed. Facial asymmetry not assessed. Tongue midline not assessed. Can spontaneously move right side and left leg but no spontaneous movements of left arm. Pronator drift not assessed. Fine motor and coordination not assessed. Gait not assessed. Romberg not assessed. Heel to toe not assessed. Distal pulses 1+ bilaterally.  Skin:  Right groin incision soft with sheath intact, no active bleeding or hematoma.  Psychiatric:  Intubated  and sedated.  Nursing note and vitals reviewed.   Imaging: Ct Angio Head W Or Wo Contrast  Result Date: 03/23/2018 CLINICAL DATA:  Right-sided weakness, aphasia EXAM: CT ANGIOGRAPHY HEAD AND NECK TECHNIQUE: Multidetector CT imaging of the head and neck was performed using the standard protocol during bolus administration of intravenous contrast. Multiplanar CT image reconstructions and MIPs were obtained to evaluate the vascular anatomy. Carotid stenosis measurements (when applicable) are obtained utilizing NASCET criteria, using the distal internal carotid diameter as the denominator. CONTRAST:  30mL ISOVUE-370 IOPAMIDOL (ISOVUE-370) INJECTION 76% COMPARISON:  CT head 03/23/2018 FINDINGS: CTA NECK FINDINGS Aortic arch: Atherosclerotic aortic arch without aneurysm. Mild atherosclerotic disease and tortuosity in the proximal great vessels without significant stenosis. Right carotid system: Scattered atherosclerotic disease in the right common carotid artery. Atherosclerotic calcified plaque in the right carotid bifurcation. 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 Left carotid system: Scattered atherosclerotic disease left common carotid artery without significant stenosis. Atherosclerotic calcification left carotid bifurcation. There is decreased flow in the left common carotid artery due to acute occlusion of the proximal left internal carotid artery with thrombus present. There is no contrast distal to the proximal left internal carotid artery which is thrombosed. Left internal carotid artery is occluded to the supraclinoid segment. Vertebral arteries: Left vertebral artery dominant with moderate stenosis at the origin. Right vertebral non dominant without significant stenosis. Skeleton: Cervical spondylosis.  No acute skeletal abnormality. Other neck: Negative for mass or adenopathy in the neck. Upper chest: Transvenous pacemaker left. Interstitial fibrosis of the lungs bilaterally. No  pleural effusion. Review of the MIP images confirms the above findings CTA HEAD FINDINGS Anterior circulation: Left internal carotid artery is occluded through the cavernous segment with reconstitution of the supraclinoid segment via flow from the anterior communicating artery. There is clot throughout the left M1 segment with slow flow. There is a small amount of flow in left M2 branches with occlusion of the posterior division of left M2. Both anterior cerebral arteries are patent. Right middle cerebral artery patent without stenosis. Atherosclerotic plaque in the right cavernous carotid with mild stenosis. Posterior circulation: Both vertebral arteries patent to the basilar. Left vertebral dominant. Right PICA patent. Left PICA not visualized. AICA patent bilaterally. Basilar widely patent. Superior cerebellar artery patent. Right posterior cerebral artery patent. Probable fetal origin of the left posterior cerebral artery with supply from a hypoplastic left P1 segment. Venous sinuses: Patent Anatomic variants: None Delayed phase: Not performed Review of the MIP images confirms the above findings IMPRESSION: 1. 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 Reconstitution of the supraclinoid left internal carotid artery through the anterior communicating artery. There is clot in the left M1 and M2 segments with decreased blood flow. There is occlusion of the posterior division of the left middle cerebral artery due to clot. 2. 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 3. Moderate stenosis origin  of left vertebral artery 4. Probable fetal origin left posterior cerebral artery which is supplied from a small hypoplastic left P1 segment. 5. These results were called by telephone at the time of interpretation on 03/23/2018 at 6:33 pm to Dr. Leonel Ramsay , who verbally acknowledged these results. Electronically Signed   By: Franchot Gallo M.D.   On: 03/23/2018 18:54   Ct Angio Neck W Or  Wo Contrast  Result Date: 03/23/2018 CLINICAL DATA:  Right-sided weakness, aphasia EXAM: CT ANGIOGRAPHY HEAD AND NECK TECHNIQUE: Multidetector CT imaging of the head and neck was performed using the standard protocol during bolus administration of intravenous contrast. Multiplanar CT image reconstructions and MIPs were obtained to evaluate the vascular anatomy. Carotid stenosis measurements (when applicable) are obtained utilizing NASCET criteria, using the distal internal carotid diameter as the denominator. CONTRAST:  45mL ISOVUE-370 IOPAMIDOL (ISOVUE-370) INJECTION 76% COMPARISON:  CT head 03/23/2018 FINDINGS: CTA NECK FINDINGS Aortic arch: Atherosclerotic aortic arch without aneurysm. Mild atherosclerotic disease and tortuosity in the proximal great vessels without significant stenosis. Right carotid system: Scattered atherosclerotic disease in the right common carotid artery. Atherosclerotic calcified plaque in the right carotid bifurcation. 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 Left carotid system: Scattered atherosclerotic disease left common carotid artery without significant stenosis. Atherosclerotic calcification left carotid bifurcation. There is decreased flow in the left common carotid artery due to acute occlusion of the proximal left internal carotid artery with thrombus present. There is no contrast distal to the proximal left internal carotid artery which is thrombosed. Left internal carotid artery is occluded to the supraclinoid segment. Vertebral arteries: Left vertebral artery dominant with moderate stenosis at the origin. Right vertebral non dominant without significant stenosis. Skeleton: Cervical spondylosis.  No acute skeletal abnormality. Other neck: Negative for mass or adenopathy in the neck. Upper chest: Transvenous pacemaker left. Interstitial fibrosis of the lungs bilaterally. No pleural effusion. Review of the MIP images confirms the above findings CTA HEAD  FINDINGS Anterior circulation: Left internal carotid artery is occluded through the cavernous segment with reconstitution of the supraclinoid segment via flow from the anterior communicating artery. There is clot throughout the left M1 segment with slow flow. There is a small amount of flow in left M2 branches with occlusion of the posterior division of left M2. Both anterior cerebral arteries are patent. Right middle cerebral artery patent without stenosis. Atherosclerotic plaque in the right cavernous carotid with mild stenosis. Posterior circulation: Both vertebral arteries patent to the basilar. Left vertebral dominant. Right PICA patent. Left PICA not visualized. AICA patent bilaterally. Basilar widely patent. Superior cerebellar artery patent. Right posterior cerebral artery patent. Probable fetal origin of the left posterior cerebral artery with supply from a hypoplastic left P1 segment. Venous sinuses: Patent Anatomic variants: None Delayed phase: Not performed Review of the MIP images confirms the above findings IMPRESSION: 1. 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 Reconstitution of the supraclinoid left internal carotid artery through the anterior communicating artery. There is clot in the left M1 and M2 segments with decreased blood flow. There is occlusion of the posterior division of the left middle cerebral artery due to clot. 2. 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 3. Moderate stenosis origin of left vertebral artery 4. Probable fetal origin left posterior cerebral artery which is supplied from a small hypoplastic left P1 segment. 5. These results were called by telephone at the time of interpretation on 03/23/2018 at 6:33 pm to Dr. Leonel Ramsay , who verbally acknowledged these  results. Electronically Signed   By: Franchot Gallo M.D.   On: 03/23/2018 18:54   Dg Chest Port 1 View  Result Date: 03/23/2018 CLINICAL DATA:  Post intubation and orogastric  tube placement. EXAM: PORTABLE CHEST 1 VIEW COMPARISON:  02/18/2018 FINDINGS: Endotracheal tube has tip 2.9 cm above the carina. Placement of enteric tube with tip over the stomach in the left upper quadrant. Left-sided pacemaker unchanged. Lungs are hypoinflated demonstrate stable chronic bilateral interstitial disease. Mild prominence of the perihilar markings suggesting mild degree of vascular congestion cardiomediastinal silhouette and remainder of the exam is unchanged. IMPRESSION: Suggestion mild vascular congestion.  Chronic interstitial disease. Tubes and lines as described. Electronically Signed   By: Marin Olp M.D.   On: 03/23/2018 21:56   Ct Head Code Stroke Wo Contrast  Result Date: 03/23/2018 CLINICAL DATA:  Code stroke.  Right-sided weakness, aphasia EXAM: CT HEAD WITHOUT CONTRAST TECHNIQUE: Contiguous axial images were obtained from the base of the skull through the vertex without intravenous contrast. COMPARISON:  None. FINDINGS: Brain: Moderate atrophy. Patchy hypodensity in the cerebral white matter bilaterally compatible with mild chronic microvascular ischemia. 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 Negative for acute hemorrhage.  No acute infarct or mass. Vascular: Hyperdense left MCA. Hyperdense terminal left internal carotid artery. Atherosclerotic calcification cavernous carotid bilaterally. Skull: Negative Sinuses/Orbits: Paranasal sinuses clear. Bilateral cataract surgery. Other: None ASPECTS (Naplate Stroke Program Early CT Score) - Ganglionic level infarction (caudate, lentiform nuclei, internal capsule, insula, M1-M3 cortex): 7 - Supraganglionic infarction (M4-M6 cortex): 3 Total score (0-10 with 10 being normal): 10 IMPRESSION: 1. Hyperdense terminal left internal carotid artery and left MCA compatible with thrombosis. No evidence of acute infarct or hemorrhage 2. Atrophy and chronic microvascular ischemic changes in the white matter. 3. ASPECTS  is 10 4. These results were called by telephone at the time of interpretation on 03/23/2018 at 6:33 pm to Dr. Leonel Ramsay, who verbally acknowledged these results. Electronically Signed   By: Franchot Gallo M.D.   On: 03/23/2018 18:35    Labs:  CBC: Recent Labs    05/28/17 0951 03/23/18 1831 03/23/18 1839 03/24/18 0403  WBC 7.4 7.8  --  9.7  HGB 15.7 14.3 15.3 12.4*  HCT 47.4 45.2 45.0 41.0  PLT 261.0 208  --  232    COAGS: Recent Labs    03/23/18 1831  INR 1.16  APTT 27    BMP: Recent Labs    11/26/17 0918 03/23/18 1831 03/23/18 1839 03/23/18 2231 03/24/18 0403  NA 142 132* 137 134* 138  K 4.5 5.0 5.8* 3.9 4.2  CL 105 101 107 107 108  CO2 29 19*  --  20* 20*  GLUCOSE 71 154* 158* 159* 195*  BUN 23 24* 34* 22 23  CALCIUM 9.8 8.4*  --  7.6* 8.3*  CREATININE 0.90 1.24 1.10 0.91 0.90  GFRNONAA  --  56*  --  >60 >60  GFRAA  --  >60  --  >60 >60    LIVER FUNCTION TESTS: Recent Labs    05/28/17 0951 11/26/17 0918 03/23/18 1831  BILITOT 0.5 0.5 1.2  AST 26 21 48*  ALT 27 20 28   ALKPHOS 42 46 33*  PROT 7.4 7.6 6.2*  ALBUMIN 4.2 4.1 3.1*    Assessment and Plan:  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 Estanislado Pandy. Patient's condition improving- remains  intubated/sedated, spontaneous movements of right side and left leg, but no spontaneous movements of left arm. Right groin incision stable with sheath intact- plan for sheath removal today. Appreciate and agree with neurology and CCM management. IR to follow.   Electronically Signed: Earley Abide, PA-C 03/24/2018, 11:08 AM   I spent a total of 15 Minutes at the the patient's bedside AND on the patient's hospital floor or unit, greater than 50% of which was counseling/coordinating care for left ICA and left MCA superior division M2 segment occlusions s/p revascularization.

## 2018-03-24 NOTE — Progress Notes (Signed)
eLink Physician-Brief Progress Note Patient Name: Terry Peck DOB: [birth year] MRN: [id number]   Date of Service  03/24/2018  HPI/Events of Note  Hypoxia - decreased sats on 5 L/min [place] O2. Now on 100% NRBM with sat = 98% and RR = 16. Extubated today.   eICU Interventions  Will order: 1. Incentive spirometry Q 1 hour while awake.  2. Portable CXR STAT.      Intervention Category Major Interventions: Hypoxemia - evaluation and management  Fuad Forget Cornelia Copa 03/24/2018, 8:57 PM

## 2018-03-24 NOTE — Procedures (Signed)
Extubation Procedure Note  Patient Details:   Name: Terry Peck DOB: [birth year] MRN: [id number]   Airway Documentation:    Vent end date: 03/24/18 Vent end time: 1523   Evaluation  O2 sats: stable throughout Complications: No apparent complications Patient did tolerate procedure well. Bilateral Breath Sounds: Diminished   Yes   Pt extubated to 3 L per MD order, increased to 5L due to spo2. Positive cuff leak noted prior to extubation. Strong productive cough and able to speak post extubation. Pt must lay flat due to sheath but encouraged to use [place] to clear secretions and not to swallow anything.   Jesse Sans 03/24/2018, 3:57 PM

## 2018-03-24 NOTE — Progress Notes (Signed)
STROKE TEAM PROGRESS NOTE   SUBJECTIVE (INTERVAL HISTORY) His wife, daughter and son are at the bedside.  Overall his condition is stable. He still intubated, but on weaning trial. Still on propofol, able to open eyes briefly. Still has right hemiparesis, femoral sheath still in.    OBJECTIVE Temp:  [97.4 F (36.3 C)-98.8 F (37.1 C)] 98.8 F (37.1 C) (12/02 0830) Pulse Rate:  [59-90] 71 (12/02 1000) Cardiac Rhythm: Ventricular paced (12/02 0800) Resp:  [11-27] 25 (12/02 1000) BP: (101-146)/(53-92) 123/55 (12/02 1000) SpO2:  [94 %-100 %] 97 % (12/02 1000) Arterial Line BP: (110-144)/(47-72) 134/51 (12/02 0945) FiO2 (%):  [40 %-50 %] 40 % (12/02 0717) Weight:  [75 kg-75.1 kg] 75.1 kg (12/02 0500)  Recent Labs  Lab 03/23/18 1828 03/23/18 2330 03/24/18 0326 03/24/18 0842  GLUCAP 160* 154* 177* 151*   Recent Labs  Lab 03/23/18 1831 03/23/18 1839 03/23/18 2231 03/24/18 0403  NA 132* 137 134* 138  K 5.0 5.8* 3.9 4.2  CL 101 107 107 108  CO2 19*  --  20* 20*  GLUCOSE 154* 158* 159* 195*  BUN 24* 34* 22 23  CREATININE 1.24 1.10 0.91 0.90  CALCIUM 8.4*  --  7.6* 8.3*  MG  --   --  1.9 2.0  PHOS  --   --   --  3.4   Recent Labs  Lab 03/23/18 1831  AST 48*  ALT 28  ALKPHOS 33*  BILITOT 1.2  PROT 6.2*  ALBUMIN 3.1*   Recent Labs  Lab 03/23/18 1831 03/23/18 1839 03/24/18 0403  WBC 7.8  --  9.7  NEUTROABS 4.8  --  8.9*  HGB 14.3 15.3 12.4*  HCT 45.2 45.0 41.0  MCV 94.0  --  94.9  PLT 208  --  232   No results for input(s): CKTOTAL, CKMB, CKMBINDEX, TROPONINI in the last 168 hours. Recent Labs    03/23/18 1831  LABPROT 14.7  INR 1.16   Recent Labs    03/23/18 2129  COLORURINE YELLOW  LABSPEC >1.046*  PHURINE 5.0  GLUCOSEU NEGATIVE  HGBUR NEGATIVE  BILIRUBINUR NEGATIVE  KETONESUR NEGATIVE  PROTEINUR NEGATIVE  NITRITE NEGATIVE  LEUKOCYTESUR NEGATIVE       Component Value Date/Time   CHOL 95 03/24/2018 0403   CHOL 104 07/17/2016 0814   TRIG 99  03/24/2018 0403   HDL 28 (L) 03/24/2018 0403   HDL 42 07/17/2016 0814   CHOLHDL 3.4 03/24/2018 0403   VLDL 20 03/24/2018 0403   LDLCALC 47 03/24/2018 0403   LDLCALC 42 07/17/2016 0814   Lab Results  Component Value Date   HGBA1C 7.0 (H) 03/24/2018   No results found for: LABOPIA, COCAINSCRNUR, LABBENZ, AMPHETMU, THCU, LABBARB  No results for input(s): ETH in the last 168 hours.  I have personally reviewed the radiological images below and agree with the radiology interpretations.  Ct Angio Head W Or Wo Contrast  Result Date: 03/23/2018 CLINICAL DATA:  Right-sided weakness, aphasia EXAM: CT ANGIOGRAPHY HEAD AND NECK TECHNIQUE: Multidetector CT imaging of the head and neck was performed using the standard protocol during bolus administration of intravenous contrast. Multiplanar CT image reconstructions and MIPs were obtained to evaluate the vascular anatomy. Carotid stenosis measurements (when applicable) are obtained utilizing NASCET criteria, using the distal internal carotid diameter as the denominator. CONTRAST:  24mL ISOVUE-370 IOPAMIDOL (ISOVUE-370) INJECTION 76% COMPARISON:  CT head 03/23/2018 FINDINGS: CTA NECK FINDINGS Aortic arch: Atherosclerotic aortic arch without aneurysm. Mild atherosclerotic disease and tortuosity in the  proximal great vessels without significant stenosis. Right carotid system: Scattered atherosclerotic disease in the right common carotid artery. Atherosclerotic calcified plaque in the right carotid bifurcation. 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 Left carotid system: Scattered atherosclerotic disease left common carotid artery without significant stenosis. Atherosclerotic calcification left carotid bifurcation. There is decreased flow in the left common carotid artery due to acute occlusion of the proximal left internal carotid artery with thrombus present. There is no contrast distal to the proximal left internal carotid artery which is  thrombosed. Left internal carotid artery is occluded to the supraclinoid segment. Vertebral arteries: Left vertebral artery dominant with moderate stenosis at the origin. Right vertebral non dominant without significant stenosis. Skeleton: Cervical spondylosis.  No acute skeletal abnormality. Other neck: Negative for mass or adenopathy in the neck. Upper chest: Transvenous pacemaker left. Interstitial fibrosis of the lungs bilaterally. No pleural effusion. Review of the MIP images confirms the above findings CTA HEAD FINDINGS Anterior circulation: Left internal carotid artery is occluded through the cavernous segment with reconstitution of the supraclinoid segment via flow from the anterior communicating artery. There is clot throughout the left M1 segment with slow flow. There is a small amount of flow in left M2 branches with occlusion of the posterior division of left M2. Both anterior cerebral arteries are patent. Right middle cerebral artery patent without stenosis. Atherosclerotic plaque in the right cavernous carotid with mild stenosis. Posterior circulation: Both vertebral arteries patent to the basilar. Left vertebral dominant. Right PICA patent. Left PICA not visualized. AICA patent bilaterally. Basilar widely patent. Superior cerebellar artery patent. Right posterior cerebral artery patent. Probable fetal origin of the left posterior cerebral artery with supply from a hypoplastic left P1 segment. Venous sinuses: Patent Anatomic variants: None Delayed phase: Not performed Review of the MIP images confirms the above findings IMPRESSION: 1. 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 Reconstitution of the supraclinoid left internal carotid artery through the anterior communicating artery. There is clot in the left M1 and M2 segments with decreased blood flow. There is occlusion of the posterior division of the left middle cerebral artery due to clot. 2. 50% diameter stenosis proximal right  internal carotid artery. 3. Moderate stenosis origin of left vertebral artery 4. Probable fetal origin left posterior cerebral artery which is supplied from a small hypoplastic left P1 segment. 5. These results were called by telephone at the time of interpretation on 03/23/2018 at 6:33 pm to Dr. Leonel Ramsay , who verbally acknowledged these results. Electronically Signed   By: Franchot Gallo M.D.   On: 03/23/2018 18:54   Ct Angio Neck W Or Wo Contrast  Result Date: 03/23/2018 CLINICAL DATA:  Right-sided weakness, aphasia EXAM: CT ANGIOGRAPHY HEAD AND NECK TECHNIQUE: Multidetector CT imaging of the head and neck was performed using the standard protocol during bolus administration of intravenous contrast. Multiplanar CT image reconstructions and MIPs were obtained to evaluate the vascular anatomy. Carotid stenosis measurements (when applicable) are obtained utilizing NASCET criteria, using the distal internal carotid diameter as the denominator. CONTRAST:  67mL ISOVUE-370 IOPAMIDOL (ISOVUE-370) INJECTION 76% COMPARISON:  CT head 03/23/2018 FINDINGS: CTA NECK FINDINGS Aortic arch: Atherosclerotic aortic arch without aneurysm. Mild atherosclerotic disease and tortuosity in the proximal great vessels without significant stenosis. Right carotid system: Scattered atherosclerotic disease in the right common carotid artery. Atherosclerotic calcified plaque in the right carotid bifurcation. 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 Left carotid system: Scattered atherosclerotic disease left common carotid artery without significant stenosis.  Atherosclerotic calcification left carotid bifurcation. There is decreased flow in the left common carotid artery due to acute occlusion of the proximal left internal carotid artery with thrombus present. There is no contrast distal to the proximal left internal carotid artery which is thrombosed. Left internal carotid artery is occluded to the supraclinoid  segment. Vertebral arteries: Left vertebral artery dominant with moderate stenosis at the origin. Right vertebral non dominant without significant stenosis. Skeleton: Cervical spondylosis.  No acute skeletal abnormality. Other neck: Negative for mass or adenopathy in the neck. Upper chest: Transvenous pacemaker left. Interstitial fibrosis of the lungs bilaterally. No pleural effusion. Review of the MIP images confirms the above findings CTA HEAD FINDINGS Anterior circulation: Left internal carotid artery is occluded through the cavernous segment with reconstitution of the supraclinoid segment via flow from the anterior communicating artery. There is clot throughout the left M1 segment with slow flow. There is a small amount of flow in left M2 branches with occlusion of the posterior division of left M2. Both anterior cerebral arteries are patent. Right middle cerebral artery patent without stenosis. Atherosclerotic plaque in the right cavernous carotid with mild stenosis. Posterior circulation: Both vertebral arteries patent to the basilar. Left vertebral dominant. Right PICA patent. Left PICA not visualized. AICA patent bilaterally. Basilar widely patent. Superior cerebellar artery patent. Right posterior cerebral artery patent. Probable fetal origin of the left posterior cerebral artery with supply from a hypoplastic left P1 segment. Venous sinuses: Patent Anatomic variants: None Delayed phase: Not performed Review of the MIP images confirms the above findings IMPRESSION: 1. 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 Reconstitution of the supraclinoid left internal carotid artery through the anterior communicating artery. There is clot in the left M1 and M2 segments with decreased blood flow. There is occlusion of the posterior division of the left middle cerebral artery due to clot. 2. 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 3. Moderate stenosis origin of left vertebral  artery 4. Probable fetal origin left posterior cerebral artery which is supplied from a small hypoplastic left P1 segment. 5. These results were called by telephone at the time of interpretation on 03/23/2018 at 6:33 pm to Dr. Leonel Ramsay , who verbally acknowledged these results. Electronically Signed   By: Franchot Gallo M.D.   On: 03/23/2018 18:54   Dg Chest Port 1 View  Result Date: 03/23/2018 CLINICAL DATA:  Post intubation and orogastric tube placement. EXAM: PORTABLE CHEST 1 VIEW COMPARISON:  02/18/2018 FINDINGS: Endotracheal tube has tip 2.9 cm above the carina. Placement of enteric tube with tip over the stomach in the left upper quadrant. Left-sided pacemaker unchanged. Lungs are hypoinflated demonstrate stable chronic bilateral interstitial disease. Mild prominence of the perihilar markings suggesting mild degree of vascular congestion cardiomediastinal silhouette and remainder of the exam is unchanged. IMPRESSION: Suggestion mild vascular congestion.  Chronic interstitial disease. Tubes and lines as described. Electronically Signed   By: Marin Olp M.D.   On: 03/23/2018 21:56   Ct Head Code Stroke Wo Contrast  Result Date: 03/23/2018 CLINICAL DATA:  Code stroke.  Right-sided weakness, aphasia EXAM: CT HEAD WITHOUT CONTRAST TECHNIQUE: Contiguous axial images were obtained from the base of the skull through the vertex without intravenous contrast. COMPARISON:  None. FINDINGS: Brain: Moderate atrophy. Patchy hypodensity in the cerebral white matter bilaterally compatible with mild chronic microvascular ischemia. 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 Negative for acute hemorrhage.  No acute infarct or mass. Vascular:  Hyperdense left MCA. Hyperdense terminal left internal carotid artery. Atherosclerotic calcification cavernous carotid bilaterally. Skull: Negative Sinuses/Orbits: Paranasal sinuses clear. Bilateral cataract surgery. Other: None ASPECTS ([place]  Stroke Program Early CT Score) - Ganglionic level infarction (caudate, lentiform nuclei, internal capsule, insula, M1-M3 cortex): 7 - Supraganglionic infarction (M4-M6 cortex): 3 Total score (0-10 with 10 being normal): 10 IMPRESSION: 1. Hyperdense terminal left internal carotid artery and left MCA compatible with thrombosis. No evidence of acute infarct or hemorrhage 2. Atrophy and chronic microvascular ischemic changes in the white matter. 3. ASPECTS is 10 4. These results were called by telephone at the time of interpretation on 03/23/2018 at 6:33 pm to Dr. Leonel Ramsay, who verbally acknowledged these results. Electronically Signed   By: Franchot Gallo M.D.   On: 03/23/2018 18:35   Pacer interrogation pending  TTE pending  MRI and MRA pending  CT head pending   PHYSICAL EXAM  Temp:  [97.4 F (36.3 C)-98.8 F (37.1 C)] 98.8 F (37.1 C) (12/02 0830) Pulse Rate:  [59-90] 71 (12/02 1000) Resp:  [11-27] 25 (12/02 1000) BP: (101-146)/(53-92) 123/55 (12/02 1000) SpO2:  [94 %-100 %] 97 % (12/02 1000) Arterial Line BP: (110-144)/(47-72) 134/51 (12/02 0945) FiO2 (%):  [40 %-50 %] 40 % (12/02 0717) Weight:  [75 kg-75.1 kg] 75.1 kg (12/02 0500)  General - Well nourished, well developed, intubated and on sedation.  Ophthalmologic - fundi not visualized due to noncooperation.  Cardiovascular - Regular rate and rhythm.  Neuro - intubated on sedation, briefly open eyes on voice, not following commands. Left gaze preference, but able to cross midline briefly. PERRL, blinking to visual threat on the left but not on the right. Facial symmetry not able to test due to ET tube, did not following commands of tongue protrusion. Moving LUE against gravity, mild withdraw of LLE on pain stimulation, but no movement on RUE and RLE on pain stimulation. No babinski bilaterally. Sensation, coordination and gait not tested.    ASSESSMENT/PLAN Terry Peck is a 77 y.o. male with history of CHF on pacer,  pulmonary fibrosis, HTN, HLD admitted for right side weakness and aphasia. TPA given. 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 s/p tPA and IR with TICI3 reperfusion, embolic pattern, secondary to unclear source, concerning for cardioembolic   Resultant intubated on sedation, right hemiplegia, left gaze, aphasia  CT head left MCA hyperdense sign, b/l hygroma   CTA head and neck - left ICA and MCA occlusion, left VA origin athero, left fetal PCA patent distally  MRI / MRA pending  CT repeat pending  2D Echo  pending  Pacemaker interrogation - pending  LDL 47  HgbA1c 7.0  SCDs for VTE prophylaxis  NPO  aspirin 81 mg daily prior to admission, now on No antithrombotic s/p tPA  Ongoing aggressive stroke risk factor management  Therapy recommendations:  pending  Disposition:  Pending  Respiratory failure with pulmonary fibrosis  Following with pulmonary for PF as outpt  Currently intubated  On weaning trial this am  Wean off sedation as able   CCM on board  CHF with pacer  Followed Dr. Rayann Heman  EF 60-65% in 08/2016  TTE this time pending  Pacer interrogation pending  Diabetes  HgbA1c 7.0 goal < 7.0  Controlled  CBG monitoring  SSI  Hypertension . Stable . BP goal < 140 post procedure  Long term BP goal normotensive  Hyperlipidemia  Home meds:  lipitor 20  LDL 47, goal < 70  Now on lipitor 20  Continue statin at discharge  Other Stroke Risk Factors  Advanced age  Other Active Problems  Hyperkalemia - K+ 5.8->4.2  Hospital day # 1  This patient is critically ill due to acute stroke, s/p tPA and intervention, pulmonary fibrosis, respiratory failure and at significant risk of neurological worsening, death form recurrent stroke, hemorrhagic conversion, heart failure, seizure. This patient's care requires constant monitoring of vital signs, hemodynamics, respiratory and cardiac monitoring, review of multiple  databases, neurological assessment, discussion with family, other specialists and medical decision making of high complexity. I spent 45 minutes of neurocritical care time in the care of this patient. I had long discussion with wife, daughter and son at bedside, updated pt current condition, treatment plan and potential prognosis. They expressed understanding and appreciation.    Rosalin Hawking, MD PhD Stroke Neurology 03/24/2018 11:01 AM    To contact Stroke Continuity provider, please refer to [email or web address] After hours, contact General Neurology

## 2018-03-24 NOTE — Procedures (Signed)
Echo attempted. Respiratory therapy with patient. Will attempt again later.

## 2018-03-24 NOTE — Progress Notes (Signed)
OT Cancellation Note  Patient Details Name: Terry Peck MRN: [id number] DOB: 1940/10/07   Cancelled Treatment:    Reason Eval/Treat Not Completed: Patient not medically ready;Active bedrest order. Will continue to follow and initiate OT eval as medically appropriate.   Delight Stare, OT Acute Rehabilitation Services Pager [phone] Office [phone]    Delight Stare 03/24/2018, 9:26 AM

## 2018-03-24 NOTE — Progress Notes (Signed)
NAME:  Terry Peck, MRN:  [id number], DOB:  [birth year], LOS: 1 ADMISSION DATE:  03/23/2018, CONSULTATION DATE:  03/23/2018 REFERRING MD:  Dr. Leonel Ramsay, CHIEF COMPLAINT:  CVA s/p Revascularization    History of present illness   77 year old male who presented on 12/1 with right sided weakness and aphasia. Last known normal 17:30. CTH negative for hemorrhage. CTA with left M2 occlusion. Given TPA @ 1834. Taken to IR for revascularization. PCCM consulted for ventilation management.  Past Medical History  Pulmonary fibrosis, HTN, DM, chronic diastolic heart failure   Significant Hospital Events   12/1 > Admitted for weakness and aphasia. Found with left ICA and Left M2 occlusion s/p tPA and revascularization 12/2 > PCCM following for vent management. Plan to extubate after head imaging obtained Consults:  Neurology 12/1 PCCM 12/1   Procedures:  ETT 12/1 >>   Significant Diagnostic Tests:  CT Head 12/1 > 1. Hyperdense terminal left internal carotid artery and left MCA compatible with thrombosis. No evidence of acute infarct or Hemorrhage Atrophy and chronic microvascular ischemic changes in the white matter. CTA Head/Neck 12/1 > 1. 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 Reconstitution of the supraclinoid left internal carotid artery through the anterior communicating artery. There is clot in the left M1 and M2 segments with decreased blood flow. There is occlusion of the posterior division of the left middle cerebral artery due to Clot. 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 Moderate stenosis origin of left vertebral artery Probable fetal origin left posterior cerebral artery which is supplied from a small hypoplastic left P1 segment.  Micro Data:  U/A 12/1 >> Tracheal Asp 12/1 >>  Antimicrobials:  N/A    Interim history/subjective:  Awake on minimal sedation. Follows commands. Unable to obtain ROS due to intubation  Objective     Blood pressure (!) 119/46, pulse 71, temperature 98.8 F (37.1 C), temperature source Axillary, resp. rate (!) 25, height 5\' 7"  (1.702 m), weight 75.1 kg, SpO2 97 %.    Vent Mode: PRVC FiO2 (%):  [40 %-50 %] 40 % Set Rate:  [16 bmp-20 bmp] 20 bmp Vt Set:  [530 mL] 530 mL PEEP:  [5 cmH20] 5 cmH20 Plateau Pressure:  [18 cmH20-21 cmH20] 18 cmH20   Intake/Output Summary (Last 24 hours) at 03/24/2018 1137 Last data filed at 03/24/2018 0900 Gross per 24 hour  Intake 2745.47 ml  Output 1000 ml  Net 1745.47 ml   Filed Weights   03/23/18 1800 03/24/18 0500  Weight: 75 kg 75.1 kg    Examination: General: Chronically ill-appearing, laying in bed, on vent HENT: NCAT, ETT in place Lungs: Anterior lung fields with ventilated breath sounds CV: RRR, systolic murmur present Abdomen: BS+, NTTP Extremities: no edema, no cyanosis Neuro: Awake, follows commands. No movement in LUE or RUE. LLE withdrawal and against gravity GU: foley in place  Resolved Hospital Problem list   Hyperkalemia  Assessment & Plan:   Left MCA infarct secondary to left ICA and left M2 occlusion s/p tPA and revascularization on 12/1 Plan  - Management per Neurology. Plan for MRI/MRA/CT head. - Holding antiplatelet/anticoagualation in setting of tPA administration - Wean sedation for RASS goal -1 - PRN Fentanyl   Respiratory Insuffiencey in post-operative setting  Pulmonary Fibrosis on 2L [place] at baseline, Followed by Lenna Gilford  Hx of Aspiration  Requiring mechanical ventilation post-op. No concerns for active infection. Plan  - Currently on minimal vent settings.  - Plan for extubation after  head imaging as noted above has been obtained - Continue Pulmicort and Brovana BID - Goal O2 saturations >92% - PRN Albuterol   HTN S/P Bi-V pacer/ICD, Followed by [facility]  H/O G1 Diastolic HF, HLD Plan  - Telemetry - D/C Cleviprex - ECHO pending  - Continue home Lipitor  - Hold home Lasix, Cozaar, Coreg   DM Plan   -Trend Glucose -SSI  -Hemoglobin A1C in AM   Best practice:  Diet: NPO Pain/Anxiety/Delirium protocol VAP protocol DVT prophylaxis: SCD GI prophylaxis: PPI Glucose control: SSI Mobility: Bedrest Code Status: FC Family Communication: No family at bedside  Disposition: Continue ICU Care  Labs   CBC: Recent Labs  Lab 03/23/18 1831 03/23/18 1839 03/24/18 0403  WBC 7.8  --  9.7  NEUTROABS 4.8  --  8.9*  HGB 14.3 15.3 12.4*  HCT 45.2 45.0 41.0  MCV 94.0  --  94.9  PLT 208  --  831    Basic Metabolic Panel: Recent Labs  Lab 03/23/18 1831 03/23/18 1839 03/23/18 2231 03/24/18 0403  NA 132* 137 134* 138  K 5.0 5.8* 3.9 4.2  CL 101 107 107 108  CO2 19*  --  20* 20*  GLUCOSE 154* 158* 159* 195*  BUN 24* 34* 22 23  CREATININE 1.24 1.10 0.91 0.90  CALCIUM 8.4*  --  7.6* 8.3*  MG  --   --  1.9 2.0  PHOS  --   --   --  3.4   GFR: Estimated Creatinine Clearance: 64.3 mL/min (by C-G formula based on SCr of 0.9 mg/dL). Recent Labs  Lab 03/23/18 1831 03/23/18 2230 03/24/18 0403  WBC 7.8  --  9.7  LATICACIDVEN  --  1.0  --     Liver Function Tests: Recent Labs  Lab 03/23/18 1831  AST 48*  ALT 28  ALKPHOS 33*  BILITOT 1.2  PROT 6.2*  ALBUMIN 3.1*   No results for input(s): LIPASE, AMYLASE in the last 168 hours. No results for input(s): AMMONIA in the last 168 hours.  ABG    Component Value Date/Time   PHART 7.348 (L) 03/24/2018 0328   PCO2ART 32.0 03/24/2018 0328   PO2ART 96.0 03/24/2018 0328   HCO3 17.7 (L) 03/24/2018 0328   TCO2 19 (L) 03/24/2018 0328   ACIDBASEDEF 7.0 (H) 03/24/2018 0328   O2SAT 97.0 03/24/2018 0328     Coagulation Profile: Recent Labs  Lab 03/23/18 1831  INR 1.16    Cardiac Enzymes: No results for input(s): CKTOTAL, CKMB, CKMBINDEX, TROPONINI in the last 168 hours.  HbA1C: Hgb A1c MFr Bld  Date/Time Value Ref Range Status  03/24/2018 04:03 AM 7.0 (H) 4.8 - 5.6 % Final    Comment:    (NOTE) Pre diabetes:           5.7%-6.4% Diabetes:              >6.4% Glycemic control for   <7.0% adults with diabetes   11/26/2017 09:18 AM 7.6 (H) 4.6 - 6.5 % Final    Comment:    Glycemic Control Guidelines for People with Diabetes:Non Diabetic:  <6%Goal of Therapy: <7%Additional Action Suggested:  >8%     CBG: Recent Labs  Lab 03/23/18 1828 03/23/18 2330 03/24/18 0326 03/24/18 0842  GLUCAP 160* 154* 177* 151*    Critical care time: 40 minutes     The patient is critically ill with multiple organ systems failure and requires high complexity decision making for assessment and support, frequent evaluation and titration  of therapies, application of advanced monitoring technologies and extensive interpretation of multiple databases.   Critical Care Time devoted to patient care services described in this note is  40 Minutes. This time reflects time of care of this signee Dr. Rodman Pickle. This critical care time does not reflect procedure time, or teaching time or supervisory time of PA/NP/Med student/Med Resident etc but could involve care discussion time.  Rodman Pickle, M.D. [facility] Pulmonary/Critical Care Medicine. Pager: [phone]. After hours pager: [phone].

## 2018-03-24 NOTE — Progress Notes (Signed)
PT Cancellation Note  Patient Details Name: Terry Peck MRN: [id number] DOB: 07/10/1940   Cancelled Treatment:    Reason Eval/Treat Not Completed: Patient not medically ready. Pt on strict bedrest due to still having sheath in. PT to return as able, as appropriate.  Kittie Plater, PT, DPT Acute Rehabilitation Services Pager #: [phone] Office #: [phone]    Berline Lopes 03/24/2018, 9:27 AM

## 2018-03-25 ENCOUNTER — Inpatient Hospital Stay (HOSPITAL_COMMUNITY): Payer: Medicare Other

## 2018-03-25 ENCOUNTER — Encounter (HOSPITAL_COMMUNITY): Payer: Self-pay | Admitting: Interventional Radiology

## 2018-03-25 DIAGNOSIS — I361 Nonrheumatic tricuspid (valve) insufficiency: Secondary | ICD-10-CM

## 2018-03-25 DIAGNOSIS — R0689 Other abnormalities of breathing: Secondary | ICD-10-CM

## 2018-03-25 DIAGNOSIS — D72829 Elevated white blood cell count, unspecified: Secondary | ICD-10-CM

## 2018-03-25 DIAGNOSIS — Z978 Presence of other specified devices: Secondary | ICD-10-CM

## 2018-03-25 DIAGNOSIS — I6602 Occlusion and stenosis of left middle cerebral artery: Secondary | ICD-10-CM

## 2018-03-25 LAB — CBC
HCT: 36.7 % — ABNORMAL LOW (ref 39.0–52.0)
HCT: 35.5 % — ABNORMAL LOW (ref 39.0–52.0)
Hemoglobin: 11.7 g/dL — ABNORMAL LOW (ref 13.0–17.0)
Hemoglobin: 11.2 g/dL — ABNORMAL LOW (ref 13.0–17.0)
MCH: 29.4 pg (ref 26.0–34.0)
MCH: 29.4 pg (ref 26.0–34.0)
MCHC: 31.9 g/dL (ref 30.0–36.0)
MCHC: 31.5 g/dL (ref 30.0–36.0)
MCV: 92.2 fL (ref 80.0–100.0)
MCV: 93.2 fL (ref 80.0–100.0)
NRBC: 0 % (ref 0.0–0.2)
Platelets: 203 10*3/uL (ref 150–400)
Platelets: 203 10*3/uL (ref 150–400)
RBC: 3.98 MIL/uL — ABNORMAL LOW (ref 4.22–5.81)
RBC: 3.81 MIL/uL — ABNORMAL LOW (ref 4.22–5.81)
RDW: 15 % (ref 11.5–15.5)
RDW: 14.9 % (ref 11.5–15.5)
WBC: 13.5 10*3/uL — ABNORMAL HIGH (ref 4.0–10.5)
WBC: 13.9 10*3/uL — ABNORMAL HIGH (ref 4.0–10.5)
nRBC: 0 % (ref 0.0–0.2)

## 2018-03-25 LAB — POCT I-STAT 3, ART BLOOD GAS (G3+)
Acid-base deficit: 4 mmol/L — ABNORMAL HIGH (ref 0.0–2.0)
BICARBONATE: 24.4 mmol/L (ref 20.0–28.0)
Bicarbonate: 21.6 mmol/L (ref 20.0–28.0)
O2 Saturation: 93 %
O2 Saturation: 99 %
TCO2: 23 mmol/L (ref 22–32)
TCO2: 26 mmol/L (ref 22–32)
pCO2 arterial: 39.5 mmHg (ref 32.0–48.0)
pCO2 arterial: 39.9 mmHg (ref 32.0–48.0)
pH, Arterial: 7.345 — ABNORMAL LOW (ref 7.350–7.450)
pH, Arterial: 7.395 (ref 7.350–7.450)
pO2, Arterial: 71 mmHg — ABNORMAL LOW (ref 83.0–108.0)
pO2, Arterial: 127 mmHg — ABNORMAL HIGH (ref 83.0–108.0)

## 2018-03-25 LAB — BASIC METABOLIC PANEL
ANION GAP: 8 (ref 5–15)
Anion gap: 8 (ref 5–15)
BUN: 16 mg/dL (ref 8–23)
BUN: 15 mg/dL (ref 8–23)
CO2: 22 mmol/L (ref 22–32)
CO2: 25 mmol/L (ref 22–32)
Calcium: 8.6 mg/dL — ABNORMAL LOW (ref 8.9–10.3)
Calcium: 8.5 mg/dL — ABNORMAL LOW (ref 8.9–10.3)
Chloride: 108 mmol/L (ref 98–111)
Chloride: 107 mmol/L (ref 98–111)
Creatinine, Ser: 0.9 mg/dL (ref 0.61–1.24)
Creatinine, Ser: 1 mg/dL (ref 0.61–1.24)
GFR calc Af Amer: >60 mL/min (ref >60)
GFR calc Af Amer: >60 mL/min (ref >60)
GFR calc non Af Amer: >60 mL/min (ref >60)
GFR calc non Af Amer: >60 mL/min (ref >60)
Glucose, Bld: 149 mg/dL — ABNORMAL HIGH (ref 70–99)
Glucose, Bld: 113 mg/dL — ABNORMAL HIGH (ref 70–99)
Potassium: 3.9 mmol/L (ref 3.5–5.1)
Potassium: 3.6 mmol/L (ref 3.5–5.1)
Sodium: 138 mmol/L (ref 135–145)
Sodium: 140 mmol/L (ref 135–145)

## 2018-03-25 LAB — GLUCOSE, CAPILLARY
GLUCOSE-CAPILLARY: 121 mg/dL — AB (ref 70–99)
Glucose-Capillary: 102 mg/dL — ABNORMAL HIGH (ref 70–99)
Glucose-Capillary: 133 mg/dL — ABNORMAL HIGH (ref 70–99)
Glucose-Capillary: 168 mg/dL — ABNORMAL HIGH (ref 70–99)
Glucose-Capillary: 171 mg/dL — ABNORMAL HIGH (ref 70–99)
Glucose-Capillary: 189 mg/dL — ABNORMAL HIGH (ref 70–99)
Glucose-Capillary: 75 mg/dL (ref 70–99)

## 2018-03-25 LAB — ECHOCARDIOGRAM COMPLETE
Height: 67 in
Weight: 2638.47 oz

## 2018-03-25 LAB — PROTIME-INR
INR: 1.07
Prothrombin Time: 13.8 seconds (ref 11.4–15.2)

## 2018-03-25 LAB — TRIGLYCERIDES: Triglycerides: 179 mg/dL — ABNORMAL HIGH (ref <150)

## 2018-03-25 LAB — APTT: aPTT: 31 seconds (ref 24–36)

## 2018-03-25 MED ORDER — PROPOFOL 1000 MG/100ML IV EMUL
5.0000 ug/kg/min | INTRAVENOUS | Status: DC
  Administered 2018-03-25 – 2018-03-26 (×3): 10 ug/kg/min via INTRAVENOUS
  Administered 2018-03-27 (×3): 30 ug/kg/min via INTRAVENOUS
  Administered 2018-03-28: 40 ug/kg/min via INTRAVENOUS
  Filled 2018-03-25 (×7): qty 100

## 2018-03-25 MED ORDER — ETOMIDATE 2 MG/ML IV SOLN
20.0000 mg | Freq: Once | INTRAVENOUS | Status: AC
  Administered 2018-03-25: 20 mg via INTRAVENOUS

## 2018-03-25 MED ORDER — NOREPINEPHRINE 4 MG/250ML-% IV SOLN
0.0000 ug/min | INTRAVENOUS | Status: DC
  Administered 2018-03-25: 2 ug/min via INTRAVENOUS
  Administered 2018-03-26: 3 ug/min via INTRAVENOUS
  Filled 2018-03-25 (×3): qty 250

## 2018-03-25 MED ORDER — LORAZEPAM 2 MG/ML IJ SOLN: 0.2500 mg | Freq: Once | INTRAMUSCULAR | Status: DC

## 2018-03-25 MED ORDER — ATORVASTATIN CALCIUM 10 MG PO TABS
20.0000 mg | ORAL_TABLET | Freq: Every day | ORAL | Status: DC
  Administered 2018-03-25 – 2018-04-03 (×8): 20 mg via ORAL
  Filled 2018-03-25 (×9): qty 2

## 2018-03-25 MED ORDER — ROCURONIUM BROMIDE 50 MG/5ML IV SOLN
50.0000 mg | Freq: Once | INTRAVENOUS | Status: AC
  Administered 2018-03-25: 50 mg via INTRAVENOUS

## 2018-03-25 MED ORDER — LEVOFLOXACIN IN D5W 750 MG/150ML IV SOLN
750.0000 mg | INTRAVENOUS | Status: AC
  Administered 2018-03-25 – 2018-03-29 (×5): 750 mg via INTRAVENOUS
  Filled 2018-03-25 (×5): qty 150

## 2018-03-25 MED ORDER — ETOMIDATE 2 MG/ML IV SOLN
0.3000 mg/kg | Freq: Once | INTRAVENOUS | Status: AC
  Administered 2018-03-25: 20 mg via INTRAVENOUS

## 2018-03-25 MED ORDER — FENTANYL CITRATE (PF) 100 MCG/2ML IJ SOLN
100.0000 ug | Freq: Once | INTRAMUSCULAR | Status: AC
  Administered 2018-03-25: 100 ug via INTRAVENOUS

## 2018-03-25 MED ORDER — SODIUM CHLORIDE 0.9 % IV SOLN
INTRAVENOUS | Status: DC | PRN
  Administered 2018-03-25: 250 mL via INTRAVENOUS

## 2018-03-25 MED ORDER — FUROSEMIDE 10 MG/ML IJ SOLN
20.0000 mg | Freq: Once | INTRAMUSCULAR | Status: AC
  Administered 2018-03-25: 20 mg via INTRAVENOUS
  Filled 2018-03-25: qty 2

## 2018-03-25 MED ORDER — FUROSEMIDE 10 MG/ML IJ SOLN: 40.0000 mg | Freq: Once | INTRAMUSCULAR | Status: DC

## 2018-03-25 MED ORDER — ROCURONIUM BROMIDE 50 MG/5ML IV SOLN
1.0000 mg/kg | Freq: Once | INTRAVENOUS | Status: AC
  Administered 2018-03-25: 50 mg via INTRAVENOUS

## 2018-03-25 MED ORDER — MIDAZOLAM HCL 2 MG/2ML IJ SOLN
1.0000 mg | Freq: Once | INTRAMUSCULAR | Status: AC
  Administered 2018-03-25: 1 mg via INTRAVENOUS

## 2018-03-25 MED ORDER — MIDAZOLAM HCL 2 MG/2ML IJ SOLN
INTRAMUSCULAR | Status: AC
  Filled 2018-03-25: qty 2

## 2018-03-25 MED ORDER — CLEVIDIPINE BUTYRATE 0.5 MG/ML IV EMUL: 0.0000 mg/h | INTRAVENOUS | Status: DC

## 2018-03-25 MED ORDER — FENTANYL CITRATE (PF) 100 MCG/2ML IJ SOLN
INTRAMUSCULAR | Status: AC
  Filled 2018-03-25: qty 2

## 2018-03-25 MED ORDER — SODIUM CHLORIDE 0.9 % IV SOLN
1000.0000 mg | Freq: Every day | INTRAVENOUS | Status: AC
  Administered 2018-03-25 – 2018-03-27 (×3): 1000 mg via INTRAVENOUS
  Filled 2018-03-25 (×3): qty 8

## 2018-03-25 NOTE — Progress Notes (Signed)
eLink Physician-Brief Progress Note Patient Name: STEVIN BIELINSKI DOB: 03-10-1941 MRN: [id number]   Date of Service  03/25/2018  HPI/Events of Note  Straight cath of bladder --> blood. Will need CBI.  eICU Interventions  Will ask ground team to evaluate.      Intervention Category Major Interventions: Other:  Sommer,Steven Cornelia Copa 03/25/2018, 5:59 AM

## 2018-03-25 NOTE — Progress Notes (Signed)
SLP Cancellation Note  Patient Details Name: Terry Peck MRN: [id number] DOB: [birth year]   Cancelled treatment:       Reason Eval/Treat Not Completed: Medical issues which prohibited therapy.  Pt now intubated due to worsening respiratory distress.  SLP will continue to follow along and will see for evaluation as is medically appropriate.     Terry Peck, [name] 03/25/2018, 11:22 AM

## 2018-03-25 NOTE — Significant Event (Signed)
Patient's respiratory status progressively worsened despite HFNC, heated high flow.  NIV was attempted however patient unable to tolerate. Discussed with patient and patient's son via telephone call that the next step in management would be intubation. We discussed risks and benefits of procedure. Both patient and son agreed to plan.  The patient is critically ill with multiple organ systems failure and requires high complexity decision making for assessment and support, frequent evaluation and titration of therapies, application of advanced monitoring technologies and extensive interpretation of multiple databases.   Critical Care Time devoted to patient care services described in this note is  31 Minutes. This time reflects time of care of this signee Dr. Rodman Pickle. This critical care time does not reflect procedure time, or teaching time or supervisory time of PA/NP/Med student/Med Resident etc but could involve care discussion time.  Rodman Pickle, M.D. [facility] Pulmonary/Critical Care Medicine. Pager: [phone]. After hours pager: [phone].

## 2018-03-25 NOTE — Progress Notes (Signed)
NAME:  Terry Peck, MRN:  [id number], DOB:  1940-05-27, LOS: 2 ADMISSION DATE:  03/23/2018, CONSULTATION DATE:  03/23/2018 REFERRING MD:  Dr. Leonel Ramsay, CHIEF COMPLAINT:  CVA s/p Revascularization    History of present illness   77 year old male who presented on 12/1 with right sided weakness and aphasia. Last known normal 17:30. CTH negative for hemorrhage. CTA with left M2 occlusion. Given TPA @ 1834. Taken to IR for revascularization. PCCM consulted for ventilation management.  Past Medical History  Pulmonary fibrosis, HTN, DM, chronic diastolic heart failure   Significant Hospital Events   12/1 > Admitted for weakness and aphasia. Found with left ICA and Left M2 occlusion s/p tPA and revascularization 12/2 > PCCM following for vent management. Plan to extubate after head imaging obtained 12/3 > Hematuria likely related to traumatic foley in setting of tPA administration. Urology consulted for CBI Consults:  Neurology 12/1 PCCM 12/1  Urology 12/3  Procedures:  ETT 12/1 >> 12/2  Significant Diagnostic Tests:  CT Head 12/1 > 1. Hyperdense terminal left internal carotid artery and left MCA compatible with thrombosis. No evidence of acute infarct or Hemorrhage Atrophy and chronic microvascular ischemic changes in the white matter. CTA Head/Neck 12/1 > 1. 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 Reconstitution of the supraclinoid left internal carotid artery through the anterior communicating artery. There is clot in the left M1 and M2 segments with decreased blood flow. There is occlusion of the posterior division of the left middle cerebral artery due to Clot. 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 Moderate stenosis origin of left vertebral artery Probable fetal origin left posterior cerebral artery which is supplied from a small hypoplastic left P1 segment.  Micro Data:  U/A 12/1 >> Tracheal Asp 12/1 >>  Antimicrobials:  N/A     Interim history/subjective:  Extubated yesterday. Denies any pain or discomfort. Patient pulled foley overnight. Overnight RN attempted straight cath and 100cc dark blood was drained. Urology consulted this morning for CBI placement.   Objective   Blood pressure (!) 150/47, pulse 86, temperature 99.4 F (37.4 C), temperature source Axillary, resp. rate (!) 26, height 5\' 7"  (1.702 m), weight 74.8 kg, SpO2 91 %.    Vent Mode: CPAP;PSV FiO2 (%):  [40 %] 40 % PEEP:  [5 cmH20] 5 cmH20 Pressure Support:  [5 cmH20] 5 cmH20   Intake/Output Summary (Last 24 hours) at 03/25/2018 0756 Last data filed at 03/25/2018 0700 Gross per 24 hour  Intake 1527.37 ml  Output 1050 ml  Net 477.37 ml   Filed Weights   03/23/18 1800 03/24/18 0500 03/25/18 0500  Weight: 75 kg 75.1 kg 74.8 kg    Examination: General: Chronically ill-appearing, laying in bed, on non-rebreather HENT: [place], AT, EOMI, no scleral icterus Lungs: Bibasilar crackles, no wheezing CV: RRR, no m/r/g Abdomen: BS+, NTTP Extremities: no edema, no cyanosis Neuro: Awake and alert, follows commands. No movement in LUE or RUE. 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 BP goal <140 - Holding antiplatelet/anticoagualation in setting of hematuria  Respiratory Insuffiencey in post-operative setting  Pulmonary Fibrosis on 2L [place] at baseline, Followed by Lenna Gilford  Hx of Aspiration  Extubated 12/2. Tenuous respiratory status Plan  - Currently on HFNC. Wean to  home O2 as tolerated - Continue nebulizers: Pulmicort and Brovana BID - Goal O2 saturations >88% - PRN Albuterol  - Diurese today  Hematuria Recent trauma in setting of recent tPA administration - Urology consulted for CBI placement  HTN S/P Bi-V pacer/ICD,  Followed by Nasher  H/O G1 Diastolic HF, HLD Plan  - Telemetry - BP goal <140 post procedure per Neurology - ECHO pending  - Continue home Lipitor  - Hold home Lasix, Cozaar, Coreg  - Diuresis as needed  DM Plan  -Trend Glucose -SSI   Best practice:  Diet: NPO Pain/Anxiety/Delirium protocol VAP protocol DVT prophylaxis: SCD GI prophylaxis: PPI Glucose control: SSI Mobility: Bedrest Code Status: FC Family Communication: No family at bedside  Disposition: Continue ICU Care  Labs   CBC: Recent Labs  Lab 03/23/18 1831 03/23/18 1839 03/24/18 0403 03/25/18 0632  WBC 7.8  --  9.7 13.5*  NEUTROABS 4.8  --  8.9*  --   HGB 14.3 15.3 12.4* 11.7*  HCT 45.2 45.0 41.0 36.7*  MCV 94.0  --  94.9 92.2  PLT 208  --  232 737    Basic Metabolic Panel: Recent Labs  Lab 03/23/18 1831 03/23/18 1839 03/23/18 2231 03/24/18 0403 03/25/18 0632  NA 132* 137 134* 138 138  K 5.0 5.8* 3.9 4.2 3.9  CL 101 107 107 108 108  CO2 19*  --  20* 20* 22  GLUCOSE 154* 158* 159* 195* 149*  BUN 24* 34* 22 23 16   CREATININE 1.24 1.10 0.91 0.90 0.90  CALCIUM 8.4*  --  7.6* 8.3* 8.6*  MG  --   --  1.9 2.0  --   PHOS  --   --   --  3.4  --    GFR: Estimated Creatinine Clearance: 64.3 mL/min (by C-G formula based on SCr of 0.9 mg/dL). Recent Labs  Lab 03/23/18 1831 03/23/18 2230 03/24/18 0403 03/25/18 0632  WBC 7.8  --  9.7 13.5*  LATICACIDVEN  --  1.0  --   --     Liver Function Tests: Recent Labs  Lab 03/23/18 1831  AST 48*  ALT 28  ALKPHOS 33*  BILITOT 1.2  PROT 6.2*  ALBUMIN 3.1*   No results for input(s): LIPASE, AMYLASE in the last 168 hours. No results for input(s): AMMONIA in the last 168 hours.  ABG    Component Value Date/Time   PHART 7.348 (L) 03/24/2018 0328   PCO2ART 32.0 03/24/2018 0328   PO2ART 96.0 03/24/2018 0328   HCO3 17.7 (L) 03/24/2018 0328   TCO2 19 (L) 03/24/2018 0328   ACIDBASEDEF 7.0 (H) 03/24/2018 0328   O2SAT 97.0 03/24/2018 0328      Coagulation Profile: Recent Labs  Lab 03/23/18 1831 03/25/18 0632  INR 1.16 1.07    Cardiac Enzymes: No results for input(s): CKTOTAL, CKMB, CKMBINDEX, TROPONINI in the last 168 hours.  HbA1C: Hgb A1c MFr Bld  Date/Time Value Ref Range Status  03/24/2018 04:03 AM 7.0 (H) 4.8 - 5.6 % Final    Comment:    (NOTE) Pre diabetes:          5.7%-6.4% Diabetes:              >6.4% Glycemic control for   <7.0% adults with diabetes   11/26/2017 09:18 AM 7.6 (H) 4.6 - 6.5 % Final    Comment:    Glycemic Control Guidelines for People with Diabetes:Non Diabetic:  <6%Goal of Therapy: <7%Additional Action Suggested:  >8%  CBG: Recent Labs  Lab 03/24/18 1243 03/24/18 1843 03/24/18 1936 03/24/18 2321 03/25/18 0333  GLUCAP 99 121* 104* 102* 102*   I reviewed the following imaging myself: CT Head 12/2 - Left MCA infarction CXR 03/24/18 - Chronic interstitial changes, left lower lobe opacity that may represent pneumonia however will need clinical correlation  Critical care time: 35 minutes     The patient is critically ill with multiple organ systems failure and requires high complexity decision making for assessment and support, frequent evaluation and titration of therapies, application of advanced monitoring technologies and extensive interpretation of multiple databases.   Critical Care Time devoted to patient care services described in this note is 35 Minutes. This time reflects time of care of this signee Dr. Rodman Pickle. This critical care time does not reflect procedure time, or teaching time or supervisory time of PA/NP/Med student/Med Resident etc but could involve care discussion time.  Rodman Pickle, M.D. [facility] Pulmonary/Critical Care Medicine. Pager: [phone]. After hours pager: [phone].

## 2018-03-25 NOTE — Progress Notes (Signed)
Pt had decreased sats on 5L O2 into the 80's. Placed on nonrebreather. Notified MD- will try to wean off NRB and utilize incentive spirometer. Will continue to monitor

## 2018-03-25 NOTE — Progress Notes (Signed)
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 Estanislado Pandy. Patient awake and alert laying in bed. Demonstrates mixed aphasia. Can spontaneously move all extremities. Right groin incision c/d/i.   Allergies: Penicillins; Iodine; and Fish oil  Medications: Prior to Admission medications   Medication Sig Start Date End Date Taking? Authorizing Provider  Insulin Glargine (LANTUS SOLOSTAR) 100 UNIT/ML Solostar Pen INJECT 50 UNITS SUBCUTANEOUSLY ONCE DAILY Patient taking differently: Inject 50 Units into the skin daily.  10/03/17  Yes Biagio Borg, MD  omeprazole (PRILOSEC) 20 MG capsule TAKE 1 CAPSULE BY MOUTH TWICE DAILY Patient taking differently: Take 20 mg by mouth 2 (two) times daily before a meal.  02/11/18  Yes Biagio Borg, MD  pioglitazone (ACTOS) 30 MG tablet TAKE 1 TABLET BY MOUTH ONCE DAILY Patient taking differently: Take 30 mg by mouth daily.  12/02/17  Yes Biagio Borg, MD  acetaminophen-codeine (TYLENOL #3) 300-30 MG tablet Take 1 tablet by mouth every 8 (eight) hours as needed for moderate pain. 02/25/16   Jaynee Eagles, PA-C  aspirin EC 81 MG tablet Take 81 mg by mouth daily.    [provider]  atorvastatin (LIPITOR) 20 MG tablet TAKE 1 TABLET BY MOUTH ONCE DAILY Patient taking differently: Take 20 mg by mouth daily.  12/30/17   Nahser, Wonda Cheng, MD  budesonide (PULMICORT) 180 MCG/ACT inhaler Inhale 2 puffs into the lungs 2 (two) times daily. 02/18/18   Noralee Space, MD  Calcium-Magnesium-Zinc 1000-400-15 MG TABS Take 1 tablet by mouth daily.     [provider]  carvedilol (COREG) 25 MG tablet TAKE 1 TABLET BY MOUTH TWICE DAILY WITH MEALS Patient taking differently: Take 25  mg by mouth 2 (two) times daily with a meal.  02/12/18   Nahser, Wonda Cheng, MD  Cholecalciferol (VITAMIN D) 1000 UNITS capsule Take 1,000 Units by mouth daily.     [provider]  Cyanocobalamin (B-12 PO) Take 1 tablet by mouth daily.     [provider]  furosemide (LASIX) 40 MG tablet Take 1 tablet (40 mg total) by mouth daily. 04/24/17   Biagio Borg, MD  ibuprofen (ADVIL,MOTRIN) 200 MG tablet Take 200-300 mg by mouth 2 (two) times daily as needed for mild pain.     [provider]  Insulin Pen Needle (RELION PEN NEEDLES) 32G X 4 MM MISC Use as directed once daily E11.9 10/07/17   Biagio Borg, MD  losartan (COZAAR) 50 MG tablet Take 1 tablet (50 mg total) by mouth daily. 08/17/16 12/16/17  Nahser, Wonda Cheng, MD  Lutein 20 MG CAPS Take 20 mg by mouth daily.    [provider]  metFORMIN (GLUCOPHAGE-XR) 500 MG 24 hr tablet Take 4 tablets (2,000 mg total) by mouth daily with breakfast. 11/26/17   Biagio Borg, MD  methocarbamol (ROBAXIN) 500 MG tablet TAKE ONE TABLET BY MOUTH ONCE DAILY AS NEEDED Patient taking differently: Take 500 mg by mouth daily as needed for muscle spasms.  12/18/16   Biagio Borg, MD  Multiple Vitamin (MULTIVITAMIN WITH MINERALS) TABS Take 1 tablet by mouth daily.    [provider]  OMEGA 3 1000 MG CAPS Take 1,000 mg by mouth 2 (two) times daily.  [provider]     Vital Signs: BP (!) 164/83   Pulse 87   Temp 99.4 F (37.4 C) (Axillary)   Resp 19   Ht 5\' 7"  (1.702 m)   Wt 164 lb 14.5 oz (74.8 kg)   SpO2 (!) 80%   BMI 25.83 kg/m   Physical Exam  Constitutional: He appears well-developed and well-nourished. No distress.  Pulmonary/Chest: Effort normal. No respiratory distress.  Neurological:  Alert and awake. Demonstrates mixed aphasia. PERRL bilaterally. EOMs not assessed. Visual fields not assessed. No facial asymmetry. Unable to protrude tongue. Can spontaneously move all extremities. Pronator  drift not assessed. Fine motor and coordination not assessed. Gait not assessed. Romberg not assessed. Heel to toe not assessed. Distal pulses 2+ bilaterally.  Skin: Skin is warm and dry.  Right groin incision soft without active bleeding or hematoma.  Psychiatric:  Demonstrates mixed aphasia.  Nursing note and vitals reviewed.   Imaging: Ct Angio Head W Or Wo Contrast  Result Date: 03/23/2018 CLINICAL DATA:  Right-sided weakness, aphasia EXAM: CT ANGIOGRAPHY HEAD AND NECK TECHNIQUE: Multidetector CT imaging of the head and neck was performed using the standard protocol during bolus administration of intravenous contrast. Multiplanar CT image reconstructions and MIPs were obtained to evaluate the vascular anatomy. Carotid stenosis measurements (when applicable) are obtained utilizing NASCET criteria, using the distal internal carotid diameter as the denominator. CONTRAST:  41mL ISOVUE-370 IOPAMIDOL (ISOVUE-370) INJECTION 76% COMPARISON:  CT head 03/23/2018 FINDINGS: CTA NECK FINDINGS Aortic arch: Atherosclerotic aortic arch without aneurysm. Mild atherosclerotic disease and tortuosity in the proximal great vessels without significant stenosis. Right carotid system: Scattered atherosclerotic disease in the right common carotid artery. Atherosclerotic calcified plaque in the right carotid bifurcation. 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 Left carotid system: Scattered atherosclerotic disease left common carotid artery without significant stenosis. Atherosclerotic calcification left carotid bifurcation. There is decreased flow in the left common carotid artery due to acute occlusion of the proximal left internal carotid artery with thrombus present. There is no contrast distal to the proximal left internal carotid artery which is thrombosed. Left internal carotid artery is occluded to the supraclinoid segment. Vertebral arteries: Left vertebral artery dominant with moderate  stenosis at the origin. Right vertebral non dominant without significant stenosis. Skeleton: Cervical spondylosis.  No acute skeletal abnormality. Other neck: Negative for mass or adenopathy in the neck. Upper chest: Transvenous pacemaker left. Interstitial fibrosis of the lungs bilaterally. No pleural effusion. Review of the MIP images confirms the above findings CTA HEAD FINDINGS Anterior circulation: Left internal carotid artery is occluded through the cavernous segment with reconstitution of the supraclinoid segment via flow from the anterior communicating artery. There is clot throughout the left M1 segment with slow flow. There is a small amount of flow in left M2 branches with occlusion of the posterior division of left M2. Both anterior cerebral arteries are patent. Right middle cerebral artery patent without stenosis. Atherosclerotic plaque in the right cavernous carotid with mild stenosis. Posterior circulation: Both vertebral arteries patent to the basilar. Left vertebral dominant. Right PICA patent. Left PICA not visualized. AICA patent bilaterally. Basilar widely patent. Superior cerebellar artery patent. Right posterior cerebral artery patent. Probable fetal origin of the left posterior cerebral artery with supply from a hypoplastic left P1 segment. Venous sinuses: Patent Anatomic variants: None Delayed phase: Not performed Review of the MIP images confirms the above findings IMPRESSION: 1. 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 Reconstitution of the supraclinoid left internal  carotid artery through the anterior communicating artery. There is clot in the left M1 and M2 segments with decreased blood flow. There is occlusion of the posterior division of the left middle cerebral artery due to clot. 2. 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 3. Moderate stenosis origin of left vertebral artery 4. Probable fetal origin left posterior cerebral artery which is  supplied from a small hypoplastic left P1 segment. 5. These results were called by telephone at the time of interpretation on 03/23/2018 at 6:33 pm to Dr. Leonel Ramsay , who verbally acknowledged these results. Electronically Signed   By: Franchot Gallo M.D.   On: 03/23/2018 18:54   Ct Head Wo Contrast  Result Date: 03/24/2018 CLINICAL DATA:  77 y/o M; left ICA occlusion post intra-arterial intervention. EXAM: CT HEAD WITHOUT CONTRAST TECHNIQUE: Contiguous axial images were obtained from the base of the skull through the vertex without intravenous contrast. COMPARISON:  03/23/2018 CT head, CTA head, and cerebral angiogram. FINDINGS: Brain: Cortical hypoattenuation with loss of gray-white differentiation and mild edema of the left lateral temporal lobe, temporal operculum, insula left parietal lobe, and the lateral aspect of the left occipital lobe compatible with late acute/early subacute infarction. No acute hemorrhage. No extra-axial collection, hydrocephalus, midline shift, or herniation. No additional area of acute stroke. Stable background of chronic microvascular ischemic changes and volume loss of the brain. 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 Vascular: Calcific atherosclerosis of carotid siphons and vertebral arteries. No hyperdense vessel identified. Skull: Normal. Negative for fracture or focal lesion. Sinuses/Orbits: No acute finding. Other: None. IMPRESSION: 1. 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 No hemorrhage or significant mass effect. 2. Stable background of chronic microvascular ischemic changes and volume loss of the brain. 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 These results will be called to the ordering clinician or representative by the Radiologist Assistant, and communication documented in the PACS or zVision Dashboard. Electronically Signed   By: Kristine Garbe M.D.   On: 03/24/2018 20:38   Ct Angio Neck W Or Wo Contrast  Result Date: 03/23/2018 CLINICAL DATA:  Right-sided weakness, aphasia EXAM: CT ANGIOGRAPHY HEAD AND NECK TECHNIQUE: Multidetector CT imaging of the head and neck was performed using the standard protocol during bolus administration of intravenous contrast. Multiplanar CT image reconstructions and MIPs were obtained to evaluate the vascular anatomy. Carotid stenosis measurements (when applicable) are obtained utilizing NASCET criteria, using the distal internal carotid diameter as the denominator. CONTRAST:  27mL ISOVUE-370 IOPAMIDOL (ISOVUE-370) INJECTION 76% COMPARISON:  CT head 03/23/2018 FINDINGS: CTA NECK FINDINGS Aortic arch: Atherosclerotic aortic arch without aneurysm. Mild atherosclerotic disease and tortuosity in the proximal great vessels without significant stenosis. Right carotid system: Scattered atherosclerotic disease in the right common carotid artery. Atherosclerotic calcified plaque in the right carotid bifurcation. 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 Left carotid system: Scattered atherosclerotic disease left common carotid artery without significant stenosis. Atherosclerotic calcification left carotid bifurcation. There is decreased flow in the left common carotid artery due to acute occlusion of the proximal left internal carotid artery with thrombus present. There is no contrast distal to the proximal left internal carotid artery which is thrombosed. Left internal carotid artery is occluded to the supraclinoid segment. Vertebral arteries: Left vertebral artery dominant with moderate stenosis at the origin. Right vertebral non dominant without significant stenosis. Skeleton: Cervical spondylosis.  No acute skeletal abnormality. Other neck: Negative for mass or adenopathy  in the neck. Upper chest: Transvenous pacemaker left. Interstitial fibrosis of the lungs bilaterally. No pleural  effusion. Review of the MIP images confirms the above findings CTA HEAD FINDINGS Anterior circulation: Left internal carotid artery is occluded through the cavernous segment with reconstitution of the supraclinoid segment via flow from the anterior communicating artery. There is clot throughout the left M1 segment with slow flow. There is a small amount of flow in left M2 branches with occlusion of the posterior division of left M2. Both anterior cerebral arteries are patent. Right middle cerebral artery patent without stenosis. Atherosclerotic plaque in the right cavernous carotid with mild stenosis. Posterior circulation: Both vertebral arteries patent to the basilar. Left vertebral dominant. Right PICA patent. Left PICA not visualized. AICA patent bilaterally. Basilar widely patent. Superior cerebellar artery patent. Right posterior cerebral artery patent. Probable fetal origin of the left posterior cerebral artery with supply from a hypoplastic left P1 segment. Venous sinuses: Patent Anatomic variants: None Delayed phase: Not performed Review of the MIP images confirms the above findings IMPRESSION: 1. 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 Reconstitution of the supraclinoid left internal carotid artery through the anterior communicating artery. There is clot in the left M1 and M2 segments with decreased blood flow. There is occlusion of the posterior division of the left middle cerebral artery due to clot. 2. 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 3. Moderate stenosis origin of left vertebral artery 4. Probable fetal origin left posterior cerebral artery which is supplied from a small hypoplastic left P1 segment. 5. These results were called by telephone at the time of interpretation on 03/23/2018 at 6:33 pm to Dr. Leonel Ramsay , who verbally acknowledged these results. Electronically Signed   By: Franchot Gallo M.D.   On: 03/23/2018 18:54   Dg Chest Port 1  View  Result Date: 03/24/2018 CLINICAL DATA:  Hypoxia EXAM: PORTABLE CHEST 1 VIEW COMPARISON:  Chest radiograph 03/23/2018 FINDINGS: Multi lead AICD device overlies the left hemithorax. Leads are stable in position. Interval extubation and removal of enteric tube. Stable cardiomegaly. Interval increase in patchy consolidation within the left mid lower lung. Scattered coarse interstitial opacities are similar when compared to prior. IMPRESSION: Increased patchy consolidation left lower lung may represent pneumonia in the appropriate clinical setting. Followup PA and lateral chest X-ray is recommended in 3-4 weeks following trial of antibiotic therapy to ensure resolution and exclude underlying malignancy. Coarse interstitial opacities bilaterally may represent edema superimposed upon chronic interstitial process. Electronically Signed   By: Lovey Newcomer M.D.   On: 03/24/2018 21:43   Dg Chest Port 1 View  Result Date: 03/23/2018 CLINICAL DATA:  Post intubation and orogastric tube placement. EXAM: PORTABLE CHEST 1 VIEW COMPARISON:  02/18/2018 FINDINGS: Endotracheal tube has tip 2.9 cm above the carina. Placement of enteric tube with tip over the stomach in the left upper quadrant. Left-sided pacemaker unchanged. Lungs are hypoinflated demonstrate stable chronic bilateral interstitial disease. Mild prominence of the perihilar markings suggesting mild degree of vascular congestion cardiomediastinal silhouette and remainder of the exam is unchanged. IMPRESSION: Suggestion mild vascular congestion.  Chronic interstitial disease. Tubes and lines as described. Electronically Signed   By: Marin Olp M.D.   On: 03/23/2018 21:56   Ct Head Code Stroke Wo Contrast  Result Date: 03/23/2018 CLINICAL DATA:  Code stroke.  Right-sided weakness, aphasia EXAM: CT HEAD WITHOUT CONTRAST TECHNIQUE: Contiguous axial images were obtained from the base of the skull through the vertex without intravenous contrast. COMPARISON:  None. FINDINGS: Brain: Moderate atrophy. Patchy hypodensity in the cerebral white matter bilaterally compatible with mild chronic microvascular ischemia. 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 Negative for acute hemorrhage.  No acute infarct or mass. Vascular: Hyperdense left MCA. Hyperdense terminal left internal carotid artery. Atherosclerotic calcification cavernous carotid bilaterally. Skull: Negative Sinuses/Orbits: Paranasal sinuses clear. Bilateral cataract surgery. Other: None ASPECTS ([place] Stroke Program Early CT Score) - Ganglionic level infarction (caudate, lentiform nuclei, internal capsule, insula, M1-M3 cortex): 7 - Supraganglionic infarction (M4-M6 cortex): 3 Total score (0-10 with 10 being normal): 10 IMPRESSION: 1. Hyperdense terminal left internal carotid artery and left MCA compatible with thrombosis. No evidence of acute infarct or hemorrhage 2. Atrophy and chronic microvascular ischemic changes in the white matter. 3. ASPECTS is 10 4. These results were called by telephone at the time of interpretation on 03/23/2018 at 6:33 pm to Dr. Leonel Ramsay, who verbally acknowledged these results. Electronically Signed   By: Franchot Gallo M.D.   On: 03/23/2018 18:35    Labs:  CBC: Recent Labs    05/28/17 0951 03/23/18 1831 03/23/18 1839 03/24/18 0403 03/25/18 0632  WBC 7.4 7.8  --  9.7 13.5*  HGB 15.7 14.3 15.3 12.4* 11.7*  HCT 47.4 45.2 45.0 41.0 36.7*  PLT 261.0 208  --  232 203    COAGS: Recent Labs    03/23/18 1831 03/25/18 0632  INR 1.16 1.07  APTT 27 31    BMP: Recent Labs    03/23/18 1831 03/23/18 1839 03/23/18 2231 03/24/18 0403 03/25/18 0632  NA 132* 137 134* 138 138  K 5.0 5.8* 3.9 4.2 3.9  CL 101 107 107 108 108  CO2 19*  --  20* 20* 22  GLUCOSE 154* 158* 159* 195* 149*  BUN 24* 34* 22 23 16   CALCIUM 8.4*  --  7.6* 8.3* 8.6*  CREATININE 1.24 1.10 0.91 0.90 0.90  GFRNONAA 56*  --  >60 >60 >60  GFRAA >60  --  >60 >60  >60    LIVER FUNCTION TESTS: Recent Labs    05/28/17 0951 11/26/17 0918 03/23/18 1831  BILITOT 0.5 0.5 1.2  AST 26 21 48*  ALT 27 20 28   ALKPHOS 42 46 33*  PROT 7.4 7.6 6.2*  ALBUMIN 4.2 4.1 3.1*    Assessment and Plan:  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 Estanislado Pandy. Patient's condition stable- with mixed aphasia but can spontaneously move all extremities. Right groin incision stable. Appreciate and agree with neurology and CCM management. IR to follow.   Electronically Signed: Earley Abide, PA-C 03/25/2018, 9:14 AM   I spent a total of 15 Minutes at the the patient's bedside AND on the patient's hospital floor or unit, greater than 50% of which was counseling/coordinating care for left ICA and left MCA superior division M2 segment occlusions s/p revascularization.

## 2018-03-25 NOTE — Procedures (Signed)
Intubation Procedure Note JAQUAVIUS HUDLER [id number] Feb 14, 1941  Procedure: Intubation Indications: Respiratory insufficiency  Procedure Details Consent: Risks of procedure as well as the alternatives and risks of each were explained to the (patient/caregiver).  Consent for procedure obtained. Time Out: Verified patient identification, verified procedure, site/side was marked, verified correct patient position, special equipment/implants available, medications/allergies/relevent history reviewed, required imaging and test results available.  Performed  Drugs Fentanyl 100 mcg, Versed 1 mg, Etomidate 20 mg, Roc 20 mg DL x 1 with Miller 3 blade Grade II view 7.5 tube passed through cords under direct visualization Placement confirmed with bilateral breath sounds, positive EtCO2 change and smoke in tube  Complication: Unable to deliver volume. ETT balloon was checked and noted to have air leak. ETT exchange attempted however ETT was dislodged. Re-intubated patient under glidescope. BVM was delivered between attempts.  Evaluation Hemodynamic Status: BP stable throughout; O2 sats: transiently fell during during procedure Patient's Current Condition: stable Complications: As noted above Patient did tolerate procedure well. Chest X-ray ordered to verify placement.  CXR: tube position acceptable.   Chi Rodman Pickle 03/25/2018

## 2018-03-25 NOTE — Progress Notes (Signed)
PT Cancellation Note  Patient Details Name: DAUNE COLGATE MRN: [id number] DOB: 04/11/1941   Cancelled Treatment:    Reason Eval/Treat Not Completed: Patient not medically ready. Pt with respiratory distress and desats with any movement. Acute PT to return as able, as appropriate.  Kittie Plater, PT, DPT Acute Rehabilitation Services Pager #: [phone] Office #: [phone]    Berline Lopes 03/25/2018, 9:05 AM

## 2018-03-25 NOTE — Progress Notes (Signed)
STROKE TEAM PROGRESS NOTE   SUBJECTIVE (INTERVAL HISTORY) His wife and son are at the bedside.  Patient was extubated yesterday afternoon and was doing well until this morning with desaturation, tried high flow nasal cannula, nonrebreather, BiPAP, not effective, finally this morning was reintubated.  He also pulled off his Foley catheter and IV overnight, difficulty with reinserting Foley catheter, causing traumatic injury of urethral with hematuria and urinary retention, finally this morning was put in Foley catheter by urology.   OBJECTIVE Temp:  [97.6 F (36.4 C)-99.4 F (37.4 C)] 99.4 F (37.4 C) (12/03 0800) Pulse Rate:  [66-94] 92 (12/03 0942) Resp:  [7-29] 7 (12/03 0942) BP: (114-201)/(47-117) 201/117 (12/03 0942) SpO2:  [80 %-100 %] 97 % (12/03 0950) Arterial Line BP: (123-163)/(48-61) 137/51 (12/02 1430) FiO2 (%):  [40 %-100 %] 100 % (12/03 0950) Weight:  [74.8 kg] 74.8 kg (12/03 0500)  Recent Labs  Lab 03/24/18 1843 03/24/18 1936 03/24/18 2321 03/25/18 0333 03/25/18 0757  GLUCAP 121* 104* 102* 102* 168*   Recent Labs  Lab 03/23/18 1831 03/23/18 1839 03/23/18 2231 03/24/18 0403 03/25/18 0632  NA 132* 137 134* 138 138  K 5.0 5.8* 3.9 4.2 3.9  CL 101 107 107 108 108  CO2 19*  --  20* 20* 22  GLUCOSE 154* 158* 159* 195* 149*  BUN 24* 34* 22 23 16   CREATININE 1.24 1.10 0.91 0.90 0.90  CALCIUM 8.4*  --  7.6* 8.3* 8.6*  MG  --   --  1.9 2.0  --   PHOS  --   --   --  3.4  --    Recent Labs  Lab 03/23/18 1831  AST 48*  ALT 28  ALKPHOS 33*  BILITOT 1.2  PROT 6.2*  ALBUMIN 3.1*   Recent Labs  Lab 03/23/18 1831 03/23/18 1839 03/24/18 0403 03/25/18 0632  WBC 7.8  --  9.7 13.5*  NEUTROABS 4.8  --  8.9*  --   HGB 14.3 15.3 12.4* 11.7*  HCT 45.2 45.0 41.0 36.7*  MCV 94.0  --  94.9 92.2  PLT 208  --  232 203   No results for input(s): CKTOTAL, CKMB, CKMBINDEX, TROPONINI in the last 168 hours. Recent Labs    03/23/18 1831 03/25/18 0632  LABPROT 14.7  13.8  INR 1.16 1.07   Recent Labs    03/23/18 2129  COLORURINE YELLOW  LABSPEC >1.046*  PHURINE 5.0  GLUCOSEU NEGATIVE  HGBUR NEGATIVE  BILIRUBINUR NEGATIVE  KETONESUR NEGATIVE  PROTEINUR NEGATIVE  NITRITE NEGATIVE  LEUKOCYTESUR NEGATIVE       Component Value Date/Time   CHOL 95 03/24/2018 0403   CHOL 104 07/17/2016 0814   TRIG 179 (H) 03/25/2018 0632   HDL 28 (L) 03/24/2018 0403   HDL 42 07/17/2016 0814   CHOLHDL 3.4 03/24/2018 0403   VLDL 20 03/24/2018 0403   LDLCALC 47 03/24/2018 0403   LDLCALC 42 07/17/2016 0814   Lab Results  Component Value Date   HGBA1C 7.0 (H) 03/24/2018   No results found for: LABOPIA, COCAINSCRNUR, LABBENZ, AMPHETMU, THCU, LABBARB  No results for input(s): ETH in the last 168 hours.  I have personally reviewed the radiological images below and agree with the radiology interpretations.  Ct Angio Head W Or Wo Contrast  Result Date: 03/23/2018 CLINICAL DATA:  Right-sided weakness, aphasia EXAM: CT ANGIOGRAPHY HEAD AND NECK TECHNIQUE: Multidetector CT imaging of the head and neck was performed using the standard protocol during bolus administration of intravenous contrast. Multiplanar CT  image reconstructions and MIPs were obtained to evaluate the vascular anatomy. Carotid stenosis measurements (when applicable) are obtained utilizing NASCET criteria, using the distal internal carotid diameter as the denominator. CONTRAST:  47mL ISOVUE-370 IOPAMIDOL (ISOVUE-370) INJECTION 76% COMPARISON:  CT head 03/23/2018 FINDINGS: CTA NECK FINDINGS Aortic arch: Atherosclerotic aortic arch without aneurysm. Mild atherosclerotic disease and tortuosity in the proximal great vessels without significant stenosis. Right carotid system: Scattered atherosclerotic disease in the right common carotid artery. Atherosclerotic calcified plaque in the right carotid bifurcation. 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 Left carotid system: Scattered  atherosclerotic disease left common carotid artery without significant stenosis. Atherosclerotic calcification left carotid bifurcation. There is decreased flow in the left common carotid artery due to acute occlusion of the proximal left internal carotid artery with thrombus present. There is no contrast distal to the proximal left internal carotid artery which is thrombosed. Left internal carotid artery is occluded to the supraclinoid segment. Vertebral arteries: Left vertebral artery dominant with moderate stenosis at the origin. Right vertebral non dominant without significant stenosis. Skeleton: Cervical spondylosis.  No acute skeletal abnormality. Other neck: Negative for mass or adenopathy in the neck. Upper chest: Transvenous pacemaker left. Interstitial fibrosis of the lungs bilaterally. No pleural effusion. Review of the MIP images confirms the above findings CTA HEAD FINDINGS Anterior circulation: Left internal carotid artery is occluded through the cavernous segment with reconstitution of the supraclinoid segment via flow from the anterior communicating artery. There is clot throughout the left M1 segment with slow flow. There is a small amount of flow in left M2 branches with occlusion of the posterior division of left M2. Both anterior cerebral arteries are patent. Right middle cerebral artery patent without stenosis. Atherosclerotic plaque in the right cavernous carotid with mild stenosis. Posterior circulation: Both vertebral arteries patent to the basilar. Left vertebral dominant. Right PICA patent. Left PICA not visualized. AICA patent bilaterally. Basilar widely patent. Superior cerebellar artery patent. Right posterior cerebral artery patent. Probable fetal origin of the left posterior cerebral artery with supply from a hypoplastic left P1 segment. Venous sinuses: Patent Anatomic variants: None Delayed phase: Not performed Review of the MIP images confirms the above findings IMPRESSION: 1. Acute  occlusion left internal carotid artery at the origin. Reconstitution of the supraclinoid left internal carotid artery through the anterior communicating artery. There is clot in the left M1 and M2 segments with decreased blood flow. There is occlusion of the posterior division of the left middle cerebral artery due to clot. 2. 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 3. Moderate stenosis origin of left vertebral artery 4. Probable fetal origin left posterior cerebral artery which is supplied from a small hypoplastic left P1 segment. 5. These results were called by telephone at the time of interpretation on 03/23/2018 at 6:33 pm to Dr. Leonel Ramsay , who verbally acknowledged these results. Electronically Signed   By: Franchot Gallo M.D.   On: 03/23/2018 18:54   Ct Angio Neck W Or Wo Contrast  Result Date: 03/23/2018 CLINICAL DATA:  Right-sided weakness, aphasia EXAM: CT ANGIOGRAPHY HEAD AND NECK TECHNIQUE: Multidetector CT imaging of the head and neck was performed using the standard protocol during bolus administration of intravenous contrast. Multiplanar CT image reconstructions and MIPs were obtained to evaluate the vascular anatomy. Carotid stenosis measurements (when applicable) are obtained utilizing NASCET criteria, using the distal internal carotid diameter as the denominator. CONTRAST:  40mL ISOVUE-370 IOPAMIDOL (ISOVUE-370) INJECTION 76% COMPARISON:  CT head 03/23/2018 FINDINGS: CTA NECK FINDINGS  Aortic arch: Atherosclerotic aortic arch without aneurysm. Mild atherosclerotic disease and tortuosity in the proximal great vessels without significant stenosis. Right carotid system: Scattered atherosclerotic disease in the right common carotid artery. Atherosclerotic calcified plaque in the right carotid bifurcation. 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 Left carotid system: Scattered atherosclerotic disease left common carotid artery without significant stenosis.  Atherosclerotic calcification left carotid bifurcation. There is decreased flow in the left common carotid artery due to acute occlusion of the proximal left internal carotid artery with thrombus present. There is no contrast distal to the proximal left internal carotid artery which is thrombosed. Left internal carotid artery is occluded to the supraclinoid segment. Vertebral arteries: Left vertebral artery dominant with moderate stenosis at the origin. Right vertebral non dominant without significant stenosis. Skeleton: Cervical spondylosis.  No acute skeletal abnormality. Other neck: Negative for mass or adenopathy in the neck. Upper chest: Transvenous pacemaker left. Interstitial fibrosis of the lungs bilaterally. No pleural effusion. Review of the MIP images confirms the above findings CTA HEAD FINDINGS Anterior circulation: Left internal carotid artery is occluded through the cavernous segment with reconstitution of the supraclinoid segment via flow from the anterior communicating artery. There is clot throughout the left M1 segment with slow flow. There is a small amount of flow in left M2 branches with occlusion of the posterior division of left M2. Both anterior cerebral arteries are patent. Right middle cerebral artery patent without stenosis. Atherosclerotic plaque in the right cavernous carotid with mild stenosis. Posterior circulation: Both vertebral arteries patent to the basilar. Left vertebral dominant. Right PICA patent. Left PICA not visualized. AICA patent bilaterally. Basilar widely patent. Superior cerebellar artery patent. Right posterior cerebral artery patent. Probable fetal origin of the left posterior cerebral artery with supply from a hypoplastic left P1 segment. Venous sinuses: Patent Anatomic variants: None Delayed phase: Not performed Review of the MIP images confirms the above findings IMPRESSION: 1. 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 Reconstitution of the  supraclinoid left internal carotid artery through the anterior communicating artery. There is clot in the left M1 and M2 segments with decreased blood flow. There is occlusion of the posterior division of the left middle cerebral artery due to clot. 2. 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 3. Moderate stenosis origin of left vertebral artery 4. Probable fetal origin left posterior cerebral artery which is supplied from a small hypoplastic left P1 segment. 5. These results were called by telephone at the time of interpretation on 03/23/2018 at 6:33 pm to Dr. Leonel Ramsay , who verbally acknowledged these results. Electronically Signed   By: Franchot Gallo M.D.   On: 03/23/2018 18:54   Dg Chest Port 1 View  Result Date: 03/23/2018 CLINICAL DATA:  Post intubation and orogastric tube placement. EXAM: PORTABLE CHEST 1 VIEW COMPARISON:  02/18/2018 FINDINGS: Endotracheal tube has tip 2.9 cm above the carina. Placement of enteric tube with tip over the stomach in the left upper quadrant. Left-sided pacemaker unchanged. Lungs are hypoinflated demonstrate stable chronic bilateral interstitial disease. Mild prominence of the perihilar markings suggesting mild degree of vascular congestion cardiomediastinal silhouette and remainder of the exam is unchanged. IMPRESSION: Suggestion mild vascular congestion.  Chronic interstitial disease. Tubes and lines as described. Electronically Signed   By: Marin Olp M.D.   On: 03/23/2018 21:56   Ct Head Code Stroke Wo Contrast  Result Date: 03/23/2018 CLINICAL DATA:  Code stroke.  Right-sided weakness, aphasia EXAM: CT HEAD WITHOUT CONTRAST TECHNIQUE: Contiguous axial  images were obtained from the base of the skull through the vertex without intravenous contrast. COMPARISON:  None. FINDINGS: Brain: Moderate atrophy. Patchy hypodensity in the cerebral white matter bilaterally compatible with mild chronic microvascular ischemia. 15 mm cyst in the left basal  ganglia most likely a choroid fissure cyst. Negative for acute hemorrhage.  No acute infarct or mass. Vascular: Hyperdense left MCA. Hyperdense terminal left internal carotid artery. Atherosclerotic calcification cavernous carotid bilaterally. Skull: Negative Sinuses/Orbits: Paranasal sinuses clear. Bilateral cataract surgery. Other: None ASPECTS ([place] Stroke Program Early CT Score) - Ganglionic level infarction (caudate, lentiform nuclei, internal capsule, insula, M1-M3 cortex): 7 - Supraganglionic infarction (M4-M6 cortex): 3 Total score (0-10 with 10 being normal): 10 IMPRESSION: 1. Hyperdense terminal left internal carotid artery and left MCA compatible with thrombosis. No evidence of acute infarct or hemorrhage 2. Atrophy and chronic microvascular ischemic changes in the white matter. 3. ASPECTS is 10 4. These results were called by telephone at the time of interpretation on 03/23/2018 at 6:33 pm to Dr. Leonel Ramsay, who verbally acknowledged these results. Electronically Signed   By: Franchot Gallo M.D.   On: 03/23/2018 18:35   Pacer interrogation pending  TTE pending  Ct Head Wo Contrast Result Date: 03/24/2018 CLINICAL DATA:  77 y/o M; left ICA occlusion post intra-arterial intervention. EXAM: CT HEAD WITHOUT CONTRAST TECHNIQUE: Contiguous axial images were obtained from the base of the skull through the vertex without intravenous contrast. COMPARISON:  03/23/2018 CT head, CTA head, and cerebral angiogram. FINDINGS: Brain: Cortical hypoattenuation with loss of gray-white differentiation and mild edema of the left lateral temporal lobe, temporal operculum, insula left parietal lobe, and the lateral aspect of the left occipital lobe compatible with late acute/early subacute infarction. No acute hemorrhage. No extra-axial collection, hydrocephalus, midline shift, or herniation. No additional area of acute stroke. Stable background of chronic microvascular ischemic changes and volume loss of the brain.  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 Vascular: Calcific atherosclerosis of carotid siphons and vertebral arteries. No hyperdense vessel identified. Skull: Normal. Negative for fracture or focal lesion. Sinuses/Orbits: No acute finding. Other: None. IMPRESSION: 1. 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 No hemorrhage or significant mass effect. 2. Stable background of chronic microvascular ischemic changes and volume loss of the brain. 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 These results will be called to the ordering clinician or representative by the Radiologist Assistant, and communication documented in the PACS or zVision Dashboard. Electronically Signed   By: Kristine Garbe M.D.   On: 03/24/2018 20:38    PHYSICAL EXAM  Temp:  [97.6 F (36.4 C)-99.4 F (37.4 C)] 99.4 F (37.4 C) (12/03 0800) Pulse Rate:  [66-94] 92 (12/03 0942) Resp:  [7-29] 7 (12/03 0942) BP: (114-201)/(47-117) 201/117 (12/03 0942) SpO2:  [80 %-100 %] 97 % (12/03 0950) Arterial Line BP: (123-163)/(48-61) 137/51 (12/02 1430) FiO2 (%):  [40 %-100 %] 100 % (12/03 0950) Weight:  [74.8 kg] 74.8 kg (12/03 0500)  General - Well nourished, well developed, intubated and on sedation.  Ophthalmologic - fundi not visualized due to noncooperation.  Cardiovascular - Regular rate and rhythm.  Neuro - intubated on sedation and muscular blocker, briefly open eyes on voice, not following commands. Eye mid position but able to have left gaze with nystagmus. Pupil 52mm bilaterally, very sluggish to light, not blinking to visual threat.  Bilateral corneal reflex absent, weak cough present.  Facial symmetry not able to test  due to ET tube, did not following commands of tongue protrusion.  No movement of all extremities on pain stimulation due to muscular blockade. No babinski bilaterally. Sensation, coordination  and gait not tested.    ASSESSMENT/PLAN Terry Peck is a 77 y.o. male with history of CHF on pacer, pulmonary fibrosis, HTN, HLD admitted for right side weakness and aphasia. TPA given. 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  Ongoing aggressive stroke risk factor management  Therapy recommendations:  pending  Disposition:  Pending  Respiratory failure with pulmonary fibrosis  Following with pulmonary for PF as outpt  intubated-extubated-reintubated  CCM on board  Wean as able  CHF with pacer  Followed Dr. Rayann Heman  EF 60-65% in 08/2016  TTE this time pending  Pacer interrogation pending  Diabetes  HgbA1c 7.0 goal < 7.0  Controlled  CBG monitoring  SSI  Hypertension . Stable on the high end . BP goal < 180 post procedure . Cleviprex PRN . On Coreg 12.5  Long term BP goal normotensive  Hyperlipidemia  Home meds:  lipitor 20   LDL 47, goal < 70  Now on lipitor 20  Continue statin at discharge  Other Stroke Risk Factors  Advanced age  Other Active Problems  Hyperkalemia - K+ 5.8->4.2-> 3.9  Leukocytosis WBC 9.7->13.5  Hospital day # 2  This patient is critically ill due to acute stroke, s/p tPA and intervention, pulmonary fibrosis, respiratory failure and at significant risk of neurological  worsening, death form recurrent stroke, hemorrhagic conversion, heart failure, seizure. This patient's care requires constant monitoring of vital signs, hemodynamics, respiratory and cardiac monitoring, review of multiple databases, neurological assessment, discussion with family, other specialists and medical decision making of high complexity. I spent 45 minutes of neurocritical care time in the care of this patient. I had long discussion with wife and son at bedside, updated pt current condition, treatment plan and potential prognosis. They expressed understanding and appreciation.    Rosalin Hawking, MD PhD Stroke Neurology 03/25/2018 11:32 AM    To contact Stroke Continuity provider, please refer to [email or web address] After hours, contact General Neurology

## 2018-03-25 NOTE — Procedures (Signed)
Intubation Procedure Note TAYM TWIST [id number] 14-Feb-1941  Procedure: Intubation Indications: Respiratory insufficiency  Procedure Details Consent: Unable to obtain consent because of emergent medical necessity. Time Out: Verified patient identification, verified procedure, site/side was marked, verified correct patient position, special equipment/implants available, medications/allergies/relevent history reviewed, required imaging and test results available.  Performed  Drugs Fentanyl 100 mcg, Versed 1 mg, Etomidate 20 mg, Roc 50 mg Glidescope with MAC 4 blade, bougie utilized Grade I view ETT tube passed through cords under direct visualization Placement confirmed with bilateral breath sounds, positive EtCO2 change and smoke in tube   Evaluation Hemodynamic Status: BP stable throughout; O2 sats: transiently fell during during procedure Patient's Current Condition: stable Complications: No apparent complications Patient did tolerate procedure well. Chest X-ray ordered to verify placement.  CXR: pending.   Seve Monette Rodman Pickle 03/25/2018

## 2018-03-25 NOTE — Significant Event (Signed)
Called to bedside by RT for self-extubation. Emergent reintubation was performed. No post-procedure complications. Will notify family.

## 2018-03-25 NOTE — Consult Note (Signed)
Reason for Consult: Gross Hematuria  Referring Physician: Rodman Pickle MD  Terry Peck is an 77 y.o. male.   HPI:   1 - Gross Hematuria - new gross hemturia after I/O cath after recetn tPA for CVA.  Hgb 11.7, Cr 0.9. Blood mostly at meatus / peri-catheter.   2 - Prostate Screening - PSA 2019 0.29 / DRE 30gm smooth==> STOP PSA based screenign at age 5.   PMH sig for IDDM2, CHF/ICD/Lasix  Today "Terry Peck" is seen in consultation for gross hemturia.   Past Medical History:  Diagnosis Date  . Abnormal ANCA test 07/03/2011  . Allergy   . Arthritis    "mild; right shoulder" (01/29/2012)  . B12 deficiency anemia   . Barrett's esophagus 11/23/2010  . Bundle branch block, left   . Cataract    L eye surgery  . CHF (congestive heart failure) ([place])    pacemaker  . Chronic systolic dysfunction of left ventricle    EF 30%  . Colon polyps 11/23/2010  . GERD (gastroesophageal reflux disease)   . Hyperlipidemia 10/14/2012  . Hypertension   . ICD (implantable cardiac defibrillator) in place   . Iron deficiency anemia 05/30/2011  . Liver abscess 1980's  . Nonischemic cardiomyopathy ([place])    s/p St. Jude BiV ICD 01/29/12  . Pulmonary fibrosis ([place])   . Pulmonary nodules   . Shortness of breath    "related to heart being out of rhythm" (01/29/2012)  . Type II diabetes mellitus ([name])     Past Surgical History:  Procedure Laterality Date  . abscess drained  ?1980's   liver  . BI-VENTRICULAR IMPLANTABLE CARDIOVERTER DEFIBRILLATOR N/A 01/29/2012   Procedure: BI-VENTRICULAR IMPLANTABLE CARDIOVERTER DEFIBRILLATOR  (CRT-D);  Surgeon: Thompson Grayer, MD;  Location: [facility] CATH LAB;  Service: Cardiovascular;  Laterality: N/A;  . CARDIAC DEFIBRILLATOR PLACEMENT  01/29/2012   SJM Quadra Assura BiV ICD implanted by Dr Rayann Heman  . CATARACT EXTRACTION Left   . COLONOSCOPY    . IR CT HEAD LTD  03/23/2018  . IR PERCUTANEOUS ART THROMBECTOMY/INFUSION INTRACRANIAL INC DIAG ANGIO  03/23/2018  . RADIOLOGY WITH  ANESTHESIA N/A 03/23/2018   Procedure: RADIOLOGY WITH ANESTHESIA;  Surgeon: Luanne Bras, MD;  Location: [facility];  Service: Radiology;  Laterality: N/A;  . TONSILLECTOMY  1952  . UPPER GI ENDOSCOPY      Family History  Problem Relation Age of Onset  . Heart disease Mother   . Colon cancer Neg Hx   . Esophageal cancer Neg Hx   . Stomach cancer Neg Hx     Social History:  reports that he quit smoking about 29 years ago. His smoking use included cigarettes. He has a 5.00 pack-year smoking history. He has never used smokeless tobacco. He reports that he does not drink alcohol or use drugs.  Allergies:  Allergies  Allergen Reactions  . Penicillins Anaphylaxis and Other (See Comments)    "swelling of eyes; throat; could breath good" (01/29/2012)  . Iodine Other (See Comments)    "if I get iodine dye, I'll get a fever" (01/29/2012)  . Fish Oil Itching    Pt said only when given through IV    Medications: I have reviewed the patient's current medications.  Results for orders placed or performed during the hospital encounter of 03/23/18 (from the past 48 hour(s))  CBG monitoring, ED     Status: Abnormal   Collection Time: 03/23/18  6:28 PM  Result Value Ref Range   Glucose-Capillary 160 (H) 70 -  99 mg/dL  Protime-INR     Status: None   Collection Time: 03/23/18  6:31 PM  Result Value Ref Range   Prothrombin Time 14.7 11.4 - 15.2 seconds   INR 1.16     Comment: Performed at [facility] [address], [place], [place] [address]  APTT     Status: None   Collection Time: 03/23/18  6:31 PM  Result Value Ref Range   aPTT 27 24 - 36 seconds    Comment: Performed at [facility] [address] [place] [address]  CBC     Status: None   Collection Time: 03/23/18  6:31 PM  Result Value Ref Range   WBC 7.8 4.0 - 10.5 K/uL   RBC 4.81 4.22 - 5.81 MIL/uL   Hemoglobin 14.3 13.0 - 17.0 g/dL   HCT 45.2 39.0 - 52.0 %   MCV 94.0 80.0 - 100.0 fL   MCH 29.7 26.0 -  34.0 pg   MCHC 31.6 30.0 - 36.0 g/dL   RDW 15.4 11.5 - 15.5 %   Platelets 208 150 - 400 K/uL   nRBC 0.0 0.0 - 0.2 %    Comment: Performed at [facility], [place] [address], [place], [place] [address]  Differential     Status: None   Collection Time: 03/23/18  6:31 PM  Result Value Ref Range   Neutrophils Relative % 61 %   Neutro Abs 4.8 1.7 - 7.7 K/uL   Lymphocytes Relative 26 %   Lymphs Abs 2.0 0.7 - 4.0 K/uL   Monocytes Relative 8 %   Monocytes Absolute 0.6 0.1 - 1.0 K/uL   Eosinophils Relative 4 %   Eosinophils Absolute 0.3 0.0 - 0.5 K/uL   Basophils Relative 1 %   Basophils Absolute 0.1 0.0 - 0.1 K/uL   Immature Granulocytes 0 %   Abs Immature Granulocytes 0.02 0.00 - 0.07 K/uL    Comment: Performed at [facility] [address], [place], [place] [address]  Comprehensive metabolic panel     Status: Abnormal   Collection Time: 03/23/18  6:31 PM  Result Value Ref Range   Sodium 132 (L) 135 - 145 mmol/L   Potassium 5.0 3.5 - 5.1 mmol/L   Chloride 101 98 - 111 mmol/L   CO2 19 (L) 22 - 32 mmol/L   Glucose, Bld 154 (H) 70 - 99 mg/dL   BUN 24 (H) 8 - 23 mg/dL   Creatinine, Ser 1.24 0.61 - 1.24 mg/dL   Calcium 8.4 (L) 8.9 - 10.3 mg/dL   Total Protein 6.2 (L) 6.5 - 8.1 g/dL   Albumin 3.1 (L) 3.5 - 5.0 g/dL   AST 48 (H) 15 - 41 U/L   ALT 28 0 - 44 U/L   Alkaline Phosphatase 33 (L) 38 - 126 U/L   Total Bilirubin 1.2 0.3 - 1.2 mg/dL   GFR calc non Af Amer 56 (L) >60 mL/min   GFR calc Af Amer >60 >60 mL/min   Anion gap 12 5 - 15    Comment: Performed at [facility], [place] [address] [place], [place] [address]  I-stat troponin, ED     Status: None   Collection Time: 03/23/18  6:38 PM  Result Value Ref Range   Troponin i, poc 0.05 0.00 - 0.08 ng/mL   Comment 3            Comment: Due to the release kinetics of cTnI, a  negative result within the first hours of the onset of symptoms does not rule out myocardial infarction with certainty. If myocardial  infarction is still suspected, repeat the test at appropriate intervals.   I-Stat Chem 8, ED     Status: Abnormal   Collection Time: 03/23/18  6:39 PM  Result Value Ref Range   Sodium 137 135 - 145 mmol/L   Potassium 5.8 (H) 3.5 - 5.1 mmol/L   Chloride 107 98 - 111 mmol/L   BUN 34 (H) 8 - 23 mg/dL   Creatinine, Ser 1.10 0.61 - 1.24 mg/dL   Glucose, Bld 158 (H) 70 - 99 mg/dL   Calcium, Ion 1.05 (L) 1.15 - 1.40 mmol/L   TCO2 25 22 - 32 mmol/L   Hemoglobin 15.3 13.0 - 17.0 g/dL   HCT 45.0 39.0 - 52.0 %  Urinalysis, Routine w reflex microscopic     Status: Abnormal   Collection Time: 03/23/18  9:29 PM  Result Value Ref Range   Color, Urine YELLOW YELLOW   APPearance CLEAR CLEAR   Specific Gravity, Urine >1.046 (H) 1.005 - 1.030   pH 5.0 5.0 - 8.0   Glucose, UA NEGATIVE NEGATIVE mg/dL   Hgb urine dipstick NEGATIVE NEGATIVE   Bilirubin Urine NEGATIVE NEGATIVE   Ketones, ur NEGATIVE NEGATIVE mg/dL   Protein, ur NEGATIVE NEGATIVE mg/dL   Nitrite NEGATIVE NEGATIVE   Leukocytes, UA NEGATIVE NEGATIVE    Comment: Performed at [facility], [address], [place], [place] [address]  I-STAT 3, arterial blood gas (G3+)     Status: Abnormal   Collection Time: 03/23/18  9:43 PM  Result Value Ref Range   pH, Arterial 7.283 (L) 7.350 - 7.450   pCO2 arterial 47.6 32.0 - 48.0 mmHg   pO2, Arterial 109.0 (H) 83.0 - 108.0 mmHg   Bicarbonate 22.3 20.0 - 28.0 mmol/L   TCO2 24 22 - 32 mmol/L   O2 Saturation 97.0 %   Acid-base deficit 4.0 (H) 0.0 - 2.0 mmol/L   Patient temperature 100.2 F    Collection site ARTERIAL LINE    Drawn by RT    Sample type ARTERIAL   Culture, respiratory (non-expectorated)     Status: None (Preliminary result)   Collection Time: 03/23/18  9:52 PM  Result Value Ref Range   Specimen Description TRACHEAL ASPIRATE    Special Requests NONE    Gram Stain      RARE WBC PRESENT,BOTH PMN AND MONONUCLEAR NO ORGANISMS SEEN    Culture      NO GROWTH 2 DAYS Performed  at [facility], [place] [address] [place], [place] [address]    Report Status PENDING   MRSA PCR Screening     Status: None   Collection Time: 03/23/18 10:01 PM  Result Value Ref Range   MRSA by PCR NEGATIVE NEGATIVE    Comment:        The GeneXpert MRSA Assay (FDA approved for NASAL specimens only), is one component of a comprehensive MRSA colonization surveillance program. It is not intended to diagnose MRSA infection nor to guide or monitor treatment for MRSA infections. Performed at [facility], [place] [address], [place], [place] [address]   Lactic acid, plasma     Status: None   Collection Time: 03/23/18 10:30 PM  Result Value Ref Range   Lactic Acid, Venous 1.0 0.5 - 1.9 mmol/L    Comment: Performed at [facility] [address] [place], [place]  [address]  Basic metabolic panel     Status: Abnormal   Collection Time: 03/23/18 10:31 PM  Result Value Ref Range   Sodium 134 (L) 135 - 145 mmol/L   Potassium 3.9 3.5 - 5.1 mmol/L    Comment: DELTA CHECK NOTED   Chloride 107 98 - 111 mmol/L   CO2 20 (L) 22 - 32 mmol/L   Glucose, Bld 159 (H) 70 - 99 mg/dL   BUN 22 8 - 23 mg/dL   Creatinine, Ser 0.91 0.61 - 1.24 mg/dL   Calcium 7.6 (L) 8.9 - 10.3 mg/dL   GFR calc non Af Amer >60 >60 mL/min   GFR calc Af Amer >60 >60 mL/min   Anion gap 7 5 - 15    Comment: Performed at [facility], [place] [address], [place], [place] [address]  Triglycerides     Status: None   Collection Time: 03/23/18 10:31 PM  Result Value Ref Range   Triglycerides 130 <150 mg/dL    Comment: Performed at [facility] [address], [place], [place] [address]  Magnesium     Status: None   Collection Time: 03/23/18 10:31 PM  Result Value Ref Range   Magnesium 1.9 1.7 - 2.4 mg/dL    Comment: Performed at [facility] [address], [place], [place] [address]  Glucose, capillary     Status: Abnormal   Collection Time: 03/23/18 11:30 PM  Result Value Ref Range    Glucose-Capillary 154 (H) 70 - 99 mg/dL  Glucose, capillary     Status: Abnormal   Collection Time: 03/24/18  3:26 AM  Result Value Ref Range   Glucose-Capillary 177 (H) 70 - 99 mg/dL  I-STAT 3, arterial blood gas (G3+)     Status: Abnormal   Collection Time: 03/24/18  3:28 AM  Result Value Ref Range   pH, Arterial 7.348 (L) 7.350 - 7.450   pCO2 arterial 32.0 32.0 - 48.0 mmHg   pO2, Arterial 96.0 83.0 - 108.0 mmHg   Bicarbonate 17.7 (L) 20.0 - 28.0 mmol/L   TCO2 19 (L) 22 - 32 mmol/L   O2 Saturation 97.0 %   Acid-base deficit 7.0 (H) 0.0 - 2.0 mmol/L   Patient temperature 97.7 F    Collection site ARTERIAL LINE    Drawn by RT    Sample type ARTERIAL   Hemoglobin A1c     Status: Abnormal   Collection Time: 03/24/18  4:03 AM  Result Value Ref Range   Hgb A1c MFr Bld 7.0 (H) 4.8 - 5.6 %    Comment: (NOTE) Pre diabetes:          5.7%-6.4% Diabetes:              >6.4% Glycemic control for   <7.0% adults with diabetes    Mean Plasma Glucose 154.2 mg/dL    Comment: Performed at [facility], [place] [address], [place], [place] [address]  Lipid panel     Status: Abnormal   Collection Time: 03/24/18  4:03 AM  Result Value Ref Range   Cholesterol 95 0 - 200 mg/dL   Triglycerides 99 <150 mg/dL   HDL 28 (L) >40 mg/dL   Total CHOL/HDL Ratio 3.4 RATIO   VLDL 20 0 - 40 mg/dL   LDL Cholesterol 47 0 - 99 mg/dL    Comment:        Total Cholesterol/HDL:CHD Risk Coronary Heart Disease Risk Table  Men   Women  1/2 Average Risk   3.4   3.3  Average Risk       5.0   4.4  2 X Average Risk   9.6   7.1  3 X Average Risk  23.4   11.0        Use the calculated Patient Ratio above and the CHD Risk Table to determine the patient's CHD Risk.        ATP III CLASSIFICATION (LDL):  <100     mg/dL   Optimal  100-129  mg/dL   Near or Above                    Optimal  130-159  mg/dL   Borderline  160-189  mg/dL   High  >190     mg/dL   Very High Performed at [facility] [address], [place], [place] [address]   Basic metabolic panel     Status: Abnormal   Collection Time: 03/24/18  4:03 AM  Result Value Ref Range   Sodium 138 135 - 145 mmol/L   Potassium 4.2 3.5 - 5.1 mmol/L   Chloride 108 98 - 111 mmol/L   CO2 20 (L) 22 - 32 mmol/L   Glucose, Bld 195 (H) 70 - 99 mg/dL   BUN 23 8 - 23 mg/dL   Creatinine, Ser 0.90 0.61 - 1.24 mg/dL   Calcium 8.3 (L) 8.9 - 10.3 mg/dL   GFR calc non Af Amer >60 >60 mL/min   GFR calc Af Amer >60 >60 mL/min   Anion gap 10 5 - 15    Comment: Performed at [facility], [place] [address], [place], [place] [address]  Magnesium     Status: None   Collection Time: 03/24/18  4:03 AM  Result Value Ref Range   Magnesium 2.0 1.7 - 2.4 mg/dL    Comment: Performed at [facility] [address], [place], [place] [address]  Phosphorus     Status: None   Collection Time: 03/24/18  4:03 AM  Result Value Ref Range   Phosphorus 3.4 2.5 - 4.6 mg/dL    Comment: Performed at [facility] [address], [place], [place] [address]  CBC with Differential/Platelet     Status: Abnormal   Collection Time: 03/24/18  4:03 AM  Result Value Ref Range   WBC 9.7 4.0 - 10.5 K/uL   RBC 4.32 4.22 - 5.81 MIL/uL   Hemoglobin 12.4 (L) 13.0 - 17.0 g/dL   HCT 41.0 39.0 - 52.0 %   MCV 94.9 80.0 - 100.0 fL   MCH 28.7 26.0 - 34.0 pg   MCHC 30.2 30.0 - 36.0 g/dL   RDW 15.2 11.5 - 15.5 %   Platelets 232 150 - 400 K/uL   nRBC 0.0 0.0 - 0.2 %   Neutrophils Relative % 93 %   Neutro Abs 8.9 (H) 1.7 - 7.7 K/uL   Lymphocytes Relative 6 %   Lymphs Abs 0.6 (L) 0.7 - 4.0 K/uL   Monocytes Relative 1 %   Monocytes Absolute 0.1 0.1 - 1.0 K/uL   Eosinophils Relative 0 %   Eosinophils Absolute 0.0 0.0 - 0.5 K/uL   Basophils Relative 0 %   Basophils Absolute 0.0 0.0 - 0.1 K/uL   Immature Granulocytes 0 %   Abs Immature Granulocytes 0.03 0.00 - 0.07 K/uL    Comment: Performed at [facility]  [address], [place],  [place] [address]  Glucose, capillary     Status: Abnormal   Collection Time: 03/24/18  8:42 AM  Result Value Ref Range   Glucose-Capillary 151 (H) 70 - 99 mg/dL  Glucose, capillary     Status: None   Collection Time: 03/24/18 12:43 PM  Result Value Ref Range   Glucose-Capillary 99 70 - 99 mg/dL  Glucose, capillary     Status: Abnormal   Collection Time: 03/24/18  6:43 PM  Result Value Ref Range   Glucose-Capillary 121 (H) 70 - 99 mg/dL  Glucose, capillary     Status: Abnormal   Collection Time: 03/24/18  7:36 PM  Result Value Ref Range   Glucose-Capillary 104 (H) 70 - 99 mg/dL  Glucose, capillary     Status: Abnormal   Collection Time: 03/24/18 11:21 PM  Result Value Ref Range   Glucose-Capillary 102 (H) 70 - 99 mg/dL  Glucose, capillary     Status: Abnormal   Collection Time: 03/25/18  3:33 AM  Result Value Ref Range   Glucose-Capillary 102 (H) 70 - 99 mg/dL  CBC     Status: Abnormal   Collection Time: 03/25/18  6:32 AM  Result Value Ref Range   WBC 13.5 (H) 4.0 - 10.5 K/uL   RBC 3.98 (L) 4.22 - 5.81 MIL/uL   Hemoglobin 11.7 (L) 13.0 - 17.0 g/dL   HCT 36.7 (L) 39.0 - 52.0 %   MCV 92.2 80.0 - 100.0 fL   MCH 29.4 26.0 - 34.0 pg   MCHC 31.9 30.0 - 36.0 g/dL   RDW 15.0 11.5 - 15.5 %   Platelets 203 150 - 400 K/uL   nRBC 0.0 0.0 - 0.2 %    Comment: Performed at [facility], [place] [address], [place], [place] [address]  Basic metabolic panel     Status: Abnormal   Collection Time: 03/25/18  6:32 AM  Result Value Ref Range   Sodium 138 135 - 145 mmol/L   Potassium 3.9 3.5 - 5.1 mmol/L   Chloride 108 98 - 111 mmol/L   CO2 22 22 - 32 mmol/L   Glucose, Bld 149 (H) 70 - 99 mg/dL   BUN 16 8 - 23 mg/dL   Creatinine, Ser 0.90 0.61 - 1.24 mg/dL   Calcium 8.6 (L) 8.9 - 10.3 mg/dL   GFR calc non Af Amer >60 >60 mL/min   GFR calc Af Amer >60 >60 mL/min   Anion gap 8 5 - 15    Comment: Performed at [facility], [place] [address] [facility] [address], [place], [place] [address]  Protime-INR      Status: None   Collection Time: 03/25/18  6:32 AM  Result Value Ref Range   Prothrombin Time 13.8 11.4 - 15.2 seconds   INR 1.07     Comment: Performed at [facility] [address], [place], [place] [address]  APTT     Status: None   Collection Time: 03/25/18  6:32 AM  Result Value Ref Range   aPTT 31 24 - 36 seconds    Comment: Performed at [facility] [address] [place], [place] [address]  Triglycerides     Status: Abnormal   Collection Time: 03/25/18  6:32 AM  Result Value Ref Range   Triglycerides 179 (H) <150 mg/dL    Comment: Performed at [facility] [address], [place], [place] [address]  Glucose, capillary     Status: Abnormal  Collection Time: 03/25/18  7:57 AM  Result Value Ref Range   Glucose-Capillary 168 (H) 70 - 99 mg/dL   Comment 1 Notify RN    Comment 2 Document in Chart   I-STAT 3, arterial blood gas (G3+)     Status: Abnormal   Collection Time: 03/25/18 11:01 AM  Result Value Ref Range   pH, Arterial 7.345 (L) 7.350 - 7.450   pCO2 arterial 39.5 32.0 - 48.0 mmHg   pO2, Arterial 71.0 (L) 83.0 - 108.0 mmHg   Bicarbonate 21.6 20.0 - 28.0 mmol/L   TCO2 23 22 - 32 mmol/L   O2 Saturation 93.0 %   Acid-base deficit 4.0 (H) 0.0 - 2.0 mmol/L   Patient temperature HIDE    Collection site RADIAL, [facility]'S TEST ACCEPTABLE    Drawn by RT    Sample type ARTERIAL   Glucose, capillary     Status: Abnormal   Collection Time: 03/25/18 11:53 AM  Result Value Ref Range   Glucose-Capillary 171 (H) 70 - 99 mg/dL   Comment 1 Notify RN    Comment 2 Document in Chart   I-STAT 3, arterial blood gas (G3+)     Status: Abnormal   Collection Time: 03/25/18 12:05 PM  Result Value Ref Range   pH, Arterial 7.395 7.350 - 7.450   pCO2 arterial 39.9 32.0 - 48.0 mmHg   pO2, Arterial 127.0 (H) 83.0 - 108.0 mmHg   Bicarbonate 24.4 20.0 - 28.0 mmol/L   TCO2 26 22 - 32 mmol/L   O2 Saturation 99.0 %   Patient temperature HIDE    Collection site RADIAL,  [facility]'S TEST ACCEPTABLE    Sample type ARTERIAL     Ct Angio Head W Or Wo Contrast  Result Date: 03/23/2018 CLINICAL DATA:  Right-sided weakness, aphasia EXAM: CT ANGIOGRAPHY HEAD AND NECK TECHNIQUE: Multidetector CT imaging of the head and neck was performed using the standard protocol during bolus administration of intravenous contrast. Multiplanar CT image reconstructions and MIPs were obtained to evaluate the vascular anatomy. Carotid stenosis measurements (when applicable) are obtained utilizing NASCET criteria, using the distal internal carotid diameter as the denominator. CONTRAST:  93mL ISOVUE-370 IOPAMIDOL (ISOVUE-370) INJECTION 76% COMPARISON:  CT head 03/23/2018 FINDINGS: CTA NECK FINDINGS Aortic arch: Atherosclerotic aortic arch without aneurysm. Mild atherosclerotic disease and tortuosity in the proximal great vessels without significant stenosis. Right carotid system: Scattered atherosclerotic disease in the right common carotid artery. Atherosclerotic calcified plaque in the right carotid bifurcation. 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 Left carotid system: Scattered atherosclerotic disease left common carotid artery without significant stenosis. Atherosclerotic calcification left carotid bifurcation. There is decreased flow in the left common carotid artery due to acute occlusion of the proximal left internal carotid artery with thrombus present. There is no contrast distal to the proximal left internal carotid artery which is thrombosed. Left internal carotid artery is occluded to the supraclinoid segment. Vertebral arteries: Left vertebral artery dominant with moderate stenosis at the origin. Right vertebral non dominant without significant stenosis. Skeleton: Cervical spondylosis.  No acute skeletal abnormality. Other neck: Negative for mass or adenopathy in the neck. Upper chest: Transvenous pacemaker left. Interstitial fibrosis of the lungs bilaterally. No pleural  effusion. Review of the MIP images confirms the above findings CTA HEAD FINDINGS Anterior circulation: Left internal carotid artery is occluded through the cavernous segment with reconstitution of the supraclinoid segment via flow from the anterior communicating artery. There is clot throughout the left M1 segment with slow flow. There  is a small amount of flow in left M2 branches with occlusion of the posterior division of left M2. Both anterior cerebral arteries are patent. Right middle cerebral artery patent without stenosis. Atherosclerotic plaque in the right cavernous carotid with mild stenosis. Posterior circulation: Both vertebral arteries patent to the basilar. Left vertebral dominant. Right PICA patent. Left PICA not visualized. AICA patent bilaterally. Basilar widely patent. Superior cerebellar artery patent. Right posterior cerebral artery patent. Probable fetal origin of the left posterior cerebral artery with supply from a hypoplastic left P1 segment. Venous sinuses: Patent Anatomic variants: None Delayed phase: Not performed Review of the MIP images confirms the above findings IMPRESSION: 1. 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 Reconstitution of the supraclinoid left internal carotid artery through the anterior communicating artery. There is clot in the left M1 and M2 segments with decreased blood flow. There is occlusion of the posterior division of the left middle cerebral artery due to clot. 2. 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 3. Moderate stenosis origin of left vertebral artery 4. Probable fetal origin left posterior cerebral artery which is supplied from a small hypoplastic left P1 segment. 5. These results were called by telephone at the time of interpretation on 03/23/2018 at 6:33 pm to Dr. Leonel Ramsay , who verbally acknowledged these results. Electronically Signed   By: Franchot Gallo M.D.   On: 03/23/2018 18:54   Ct Head Wo  Contrast  Result Date: 03/24/2018 CLINICAL DATA:  77 y/o M; left ICA occlusion post intra-arterial intervention. EXAM: CT HEAD WITHOUT CONTRAST TECHNIQUE: Contiguous axial images were obtained from the base of the skull through the vertex without intravenous contrast. COMPARISON:  03/23/2018 CT head, CTA head, and cerebral angiogram. FINDINGS: Brain: Cortical hypoattenuation with loss of gray-white differentiation and mild edema of the left lateral temporal lobe, temporal operculum, insula left parietal lobe, and the lateral aspect of the left occipital lobe compatible with late acute/early subacute infarction. No acute hemorrhage. No extra-axial collection, hydrocephalus, midline shift, or herniation. No additional area of acute stroke. Stable background of chronic microvascular ischemic changes and volume loss of the brain. 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 Vascular: Calcific atherosclerosis of carotid siphons and vertebral arteries. No hyperdense vessel identified. Skull: Normal. Negative for fracture or focal lesion. Sinuses/Orbits: No acute finding. Other: None. IMPRESSION: 1. 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 No hemorrhage or significant mass effect. 2. Stable background of chronic microvascular ischemic changes and volume loss of the brain. 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 These results will be called to the ordering clinician or representative by the Radiologist Assistant, and communication documented in the PACS or zVision Dashboard. Electronically Signed   By: Kristine Garbe M.D.   On: 03/24/2018 20:38   Ct Angio Neck W Or Wo Contrast  Result Date: 03/23/2018 CLINICAL DATA:  Right-sided weakness, aphasia EXAM: CT ANGIOGRAPHY HEAD AND NECK TECHNIQUE: Multidetector CT imaging of the head and neck was performed using the standard protocol during bolus  administration of intravenous contrast. Multiplanar CT image reconstructions and MIPs were obtained to evaluate the vascular anatomy. Carotid stenosis measurements (when applicable) are obtained utilizing NASCET criteria, using the distal internal carotid diameter as the denominator. CONTRAST:  47mL ISOVUE-370 IOPAMIDOL (ISOVUE-370) INJECTION 76% COMPARISON:  CT head 03/23/2018 FINDINGS: CTA NECK FINDINGS Aortic arch: Atherosclerotic aortic arch without aneurysm. Mild atherosclerotic disease and tortuosity in the proximal great vessels without significant stenosis.  Right carotid system: Scattered atherosclerotic disease in the right common carotid artery. Atherosclerotic calcified plaque in the right carotid bifurcation. 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 Left carotid system: Scattered atherosclerotic disease left common carotid artery without significant stenosis. Atherosclerotic calcification left carotid bifurcation. There is decreased flow in the left common carotid artery due to acute occlusion of the proximal left internal carotid artery with thrombus present. There is no contrast distal to the proximal left internal carotid artery which is thrombosed. Left internal carotid artery is occluded to the supraclinoid segment. Vertebral arteries: Left vertebral artery dominant with moderate stenosis at the origin. Right vertebral non dominant without significant stenosis. Skeleton: Cervical spondylosis.  No acute skeletal abnormality. Other neck: Negative for mass or adenopathy in the neck. Upper chest: Transvenous pacemaker left. Interstitial fibrosis of the lungs bilaterally. No pleural effusion. Review of the MIP images confirms the above findings CTA HEAD FINDINGS Anterior circulation: Left internal carotid artery is occluded through the cavernous segment with reconstitution of the supraclinoid segment via flow from the anterior communicating artery. There is clot throughout the left  M1 segment with slow flow. There is a small amount of flow in left M2 branches with occlusion of the posterior division of left M2. Both anterior cerebral arteries are patent. Right middle cerebral artery patent without stenosis. Atherosclerotic plaque in the right cavernous carotid with mild stenosis. Posterior circulation: Both vertebral arteries patent to the basilar. Left vertebral dominant. Right PICA patent. Left PICA not visualized. AICA patent bilaterally. Basilar widely patent. Superior cerebellar artery patent. Right posterior cerebral artery patent. Probable fetal origin of the left posterior cerebral artery with supply from a hypoplastic left P1 segment. Venous sinuses: Patent Anatomic variants: None Delayed phase: Not performed Review of the MIP images confirms the above findings IMPRESSION: 1. 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 Reconstitution of the supraclinoid left internal carotid artery through the anterior communicating artery. There is clot in the left M1 and M2 segments with decreased blood flow. There is occlusion of the posterior division of the left middle cerebral artery due to clot. 2. 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 3. Moderate stenosis origin of left vertebral artery 4. Probable fetal origin left posterior cerebral artery which is supplied from a small hypoplastic left P1 segment. 5. These results were called by telephone at the time of interpretation on 03/23/2018 at 6:33 pm to Dr. Leonel Ramsay , who verbally acknowledged these results. Electronically Signed   By: Franchot Gallo M.D.   On: 03/23/2018 18:54   [place]  Result Date: 03/25/2018 INDICATION: Acute onset of aphasia, left gaze deviation and right-sided paresis. Occluded left middle cerebral artery, and left internal carotid artery on CT angiogram of the head and neck. EXAM: 1. EMERGENT LARGE VESSEL OCCLUSION THROMBOLYSIS (anterior CIRCULATION) COMPARISON:  CT  angiogram of the head and neck of 03/23/2018. MEDICATIONS: Ancef 2 g IV antibiotic was administered within 1 hour of the procedure. ANESTHESIA/SEDATION: General anesthesia CONTRAST:  Isovue 300 approximately 80 mL. FLUOROSCOPY TIME:  Fluoroscopy Time: 33 minutes 48 seconds (2127 mGy). COMPLICATIONS: None immediate. TECHNIQUE: Following a full explanation of the procedure along with the potential associated complications, an informed witnessed consent was obtained from the patient's spouse and daughter. The risks of intracranial hemorrhage of 10%, worsening neurological deficit, ventilator dependency, death and inability to revascularize were all reviewed in detail with the patient's spouse and daughter. The patient was then put under general anesthesia by the Department  of Anesthesiology at [facility]. The right groin was prepped and draped in the usual sterile fashion. Thereafter using modified Seldinger technique, transfemoral access into the right common femoral artery was obtained without difficulty. Over a 0.035 inch guidewire a 5 French Pinnacle sheath was inserted. Through this, and also over a 0.035 inch guidewire a 5 [place] JB 1 catheter was advanced to the aortic arch region and selectively positioned in the left common carotid artery. FINDINGS: The left common carotid arteriogram demonstrates mild stenosis at the origin of the left external carotid artery. More distally, the left external carotid artery branches are seen to opacify normally. The left internal carotid artery at the bulb and just distal to the bulb opacifies with stagnation of blood flow. There slow ascent of contrast into the proximal left internal carotid artery in the cervical region. More distally, there is no reconstitution of the left internal carotid artery. No reconstitution is noted from the left external carotid artery branches either. PROCEDURE: ENDOVASCULAR REVASCULARIZATION OF OCCLUDED LEFT INTERNAL CAROTID ARTERY  EXTRA CRANIALLY AND INTRACRANIALLY WITH 1 PASS USING THE 5 MM X 33 MM EMBOTRAP RETRIEVAL DEVICE, AND ALSO OF THE LEFT MIDDLE CEREBRAL ARTERY SUPERIOR DIVISION WITH 1 PASS USING THE 5 MM X 33 MM EMBOTRAP DEVICE ACHIEVING A TICI 3 REVASCULARIZATION. The diagnostic JB 1 catheter in the left common carotid artery was exchanged over a 0.035 inch 300 cm Rosen exchange guidewire for an 8 French 55 cm Brite tip neurovascular sheath using biplane roadmap technique and constant fluoroscopic guidance. Good aspiration was obtained from the hub of the 8 French neurovascular sheath. This was then connected to continuous heparinized saline infusion. Over the [employer] guidewire, an 8 [place] 85 cm FlowGate balloon guide catheter which had been prepped with 50% contrast and 50% heparinized saline infusion was advanced and positioned just proximal to the left common carotid bifurcation. The guidewire was removed. Good aspiration obtained from the hub of the [facility] guide catheter. A gentle contrast injection demonstrated no evidence of dissection or of intraluminal filling defects. Over a 0.014 inch Softip Synchro micro guidewire, a combination of a 115 cm 5 [place] Navien guide catheter inside of which was an 021 Trevo ProVue microcatheter was advanced to the distal end of the [facility] guide catheter. The combination was then advanced with the micro guidewire leading with a J-tip configuration without any difficulty. The combination was advanced to the supraclinoid left ICA. There, the micro guidewire was removed. Slow aspiration of blood was noted at the hub of the microcatheter. A gentle contrast injection through the microcatheter demonstrated faint opacification of the left MCA M1 region. The micro guidewire was then reinserted. With a torque device, the micro guidewire was advanced into the M1 M2 junction of the inferior division of the left middle cerebral artery followed by the microcatheter. The guidewire was removed.  Good aspiration obtained from the hub of the microcatheter. A gentle contrast injection demonstrated safe position of tip of the microcatheter. This was then connected to continuous heparinized saline infusion. A gentle control arteriogram performed through the Navien guide catheter in the proximal cavernous segment demonstrated complete angiographic nonvisualization at the level of the ophthalmic artery. A 5 mm x 33 mm Embotrap retrieval device was then advanced in a coaxial manner and with constant heparinized saline infusion to the distal end of the microcatheter. The proximal and the distal landing zones were then defined. The O ring on the delivery microcatheter was then loosened. With slight forward gentle traction  with the right hand on the delivery micro guidewire, with the left hand the retrieval device was then retrieved. A gentle control arteriogram performed through the Navien 5 [place] guide catheter in the left internal carotid artery proximal cavernous segment demonstrated continued nonvisualization of the distal left internal carotid artery in the left middle cerebral artery distribution. Proximal flow arrest was then initiated by advancing the [facility] guide catheter into the proximal left internal carotid artery and inflating the balloon. With constant aspiration being applied at the hub of the [facility] guide catheter with a 60 mL syringe, and the Navien guide catheter with a Penumbra vacuum suction device, the combination of the retrieval device, the microcatheter and the 5 [place] Navien guide catheter were retrieved and removed. Aspiration was continued as the proximal flow arrest was then reversed by deflating the balloon of the [facility] guide catheter. Free aspiration of blood was noted at the hub of the [facility] guide catheter. A gentle control arteriogram performed through the [facility] guide catheter in the proximal left internal carotid artery demonstrated complete revascularization of the  occluded left internal carotid artery proximally and distally. A wide left posterior communicating artery was seen opacifying the left posterior cerebral artery distribution. The left anterior and the left middle cerebral arteries were now widely patent. There continued to be a filling defect noted in the superior division of the left middle cerebral artery. The combination of the 5 [place] Navien guide catheter inside of which was an 021 Trevo ProVue microcatheter was again advanced using biplane roadmap technique and constant fluoroscopic guidance over a 0.014 inch Softip Synchro micro guidewire to the supraclinoid left ICA. The micro guidewire was then gently manipulated with the torque device and advanced into the occluded superior division with a filling defect in the M2 M3 region followed by the microcatheter. The guidewire was removed. There was slow aspiration of blood at the hub of the microcatheter. A gentle control arteriogram through the microcatheter demonstrated safe position of tip of the microcatheter. The 5 mm x 33 mm Embotrap retrieval device was again advanced as described above to the distal end of the microcatheter. The O ring on the delivery microcatheter was then loosened. With slight forward gentle traction with the right hand the delivery micro guidewire with the left hand the delivery microcatheter was retrieved deploying the retrieval device. A control arteriogram performed through the Navien guide catheter in the distal cavernous left ICA demonstrated a TICI 3 revascularization. With proximal flow arrest again initiating the left internal carotid artery by inflating the FlowGate balloon guide catheter, the combination of the retrieval device, and the microcatheter and the 5 [place] Navien guide catheter were then retrieved and removed as constant aspiration was applied with a 60 mL syringe at the hub of the [facility] guide catheter, and a Penumbra suction vacuum device at the hub of the  Navien guide catheter. Proximal flow arrest was continued as the balloon was deflated in the left internal carotid artery. Clot was noted in the aspirate, and also in the retrieval device. Earlier the aspirate had revealed copious amounts of dark clot at the time of the first pass. A control arteriogram performed through the 8 [place] FlowGate guide catheter in the left internal carotid artery demonstrated a complete revascularization of the previously noted filling defect within the superior division M2 segment of the left middle cerebral artery. Overall a TICI 3 revascularization had been established. Moderate vasospasm at the superior division of the left middle cerebral artery proximally responded to  2 aliquots of 25 mcg nitroglycerin intra-arterially in the left internal carotid artery. Throughout the procedure, the patient's blood pressure and neurological status remained stable. No evidence of bradycardia or mass-effect or extravasation of contrast was noted. The FlowGate balloon guide catheter, and the 8 French Brite tip sheath were then removed over a 0.035 inch guidewire and replaced with an 8 [place] Pinnacle sheath. This was left in situ and connected to continuous heparinized saline infusion. The right groin appeared soft without evidence of hematoma. Distal pulses remained palpable in the dorsalis pedis, and posterior tibial regions on the right side, and Dopplerable DP and PT on the left side unchanged. Patient's CT angiogram revealed no evidence of a gross hemorrhage or mass effect or midline shift. The patient was left intubated on account of his pulmonary fibrosis. He was then transferred to the neuro ICU to continue with further post revascularization of large vessel ischemic stroke. IMPRESSION: Status post endovascular complete revascularization of occluded left internal carotid artery extra cranially and intracranially with 1 pass with the 5 mm x 33 mm Embotrap retrieval device. Status post  endovascular complete revascularization of occluded superior division of the left middle cerebral artery with 1 pass with a 5 mm x 33 mm Embotrap retrieval device achieving a TICI 3 revascularization. PLAN: Follow-up in clinic 1 month post discharge. Electronically Signed   By: Luanne Bras M.D.   On: 03/24/2018 11:13   Portable Chest X-ray  Result Date: 03/25/2018 CLINICAL DATA:  Intubation. EXAM: PORTABLE CHEST 1 VIEW COMPARISON:  Twelve 05/2017.  02/18/2018. FINDINGS: Endotracheal tube tip 3 cm above the carina. NG tube tip in the stomach. Heart size normal. Diffuse bilateral pulmonary interstitial prominence noted. Interstitial changes are stable and most likely represent chronic interstitial lung disease. Mild bibasilar atelectasis/infiltrates. No pleural effusion or pneumothorax. IMPRESSION: 1. Endotracheal tube tip noted 3 cm above the carina. NG tube tip noted in stomach. 3.  AICD in stable position.  Heart size stable. 4. Stable chronic interstitial changes. Mild bibasilar atelectasis/infiltrates again noted without interim change. Electronically Signed   By: Marcello Moores  Register   On: 03/25/2018 10:13   Dg Chest Port 1 View  Result Date: 03/24/2018 CLINICAL DATA:  Hypoxia EXAM: PORTABLE CHEST 1 VIEW COMPARISON:  Chest radiograph 03/23/2018 FINDINGS: Multi lead AICD device overlies the left hemithorax. Leads are stable in position. Interval extubation and removal of enteric tube. Stable cardiomegaly. Interval increase in patchy consolidation within the left mid lower lung. Scattered coarse interstitial opacities are similar when compared to prior. IMPRESSION: Increased patchy consolidation left lower lung may represent pneumonia in the appropriate clinical setting. Followup PA and lateral chest X-ray is recommended in 3-4 weeks following trial of antibiotic therapy to ensure resolution and exclude underlying malignancy. Coarse interstitial opacities bilaterally may represent edema superimposed upon  chronic interstitial process. Electronically Signed   By: Lovey Newcomer M.D.   On: 03/24/2018 21:43   Dg Chest Port 1 View  Result Date: 03/23/2018 CLINICAL DATA:  Post intubation and orogastric tube placement. EXAM: PORTABLE CHEST 1 VIEW COMPARISON:  02/18/2018 FINDINGS: Endotracheal tube has tip 2.9 cm above the carina. Placement of enteric tube with tip over the stomach in the left upper quadrant. Left-sided pacemaker unchanged. Lungs are hypoinflated demonstrate stable chronic bilateral interstitial disease. Mild prominence of the perihilar markings suggesting mild degree of vascular congestion cardiomediastinal silhouette and remainder of the exam is unchanged. IMPRESSION: Suggestion mild vascular congestion.  Chronic interstitial disease. Tubes and lines as described. Electronically Signed  By: Marin Olp M.D.   On: 03/23/2018 21:56   Ir Percutaneous Art Thrombectomy/infusion Intracranial Inc Diag Angio  Result Date: 03/25/2018 INDICATION: Acute onset of aphasia, left gaze deviation and right-sided paresis. Occluded left middle cerebral artery, and left internal carotid artery on CT angiogram of the head and neck. EXAM: 1. EMERGENT LARGE VESSEL OCCLUSION THROMBOLYSIS (anterior CIRCULATION) COMPARISON:  CT angiogram of the head and neck of 03/23/2018. MEDICATIONS: Ancef 2 g IV antibiotic was administered within 1 hour of the procedure. ANESTHESIA/SEDATION: General anesthesia CONTRAST:  Isovue 300 approximately 80 mL. FLUOROSCOPY TIME:  Fluoroscopy Time: 33 minutes 48 seconds (2127 mGy). COMPLICATIONS: None immediate. TECHNIQUE: Following a full explanation of the procedure along with the potential associated complications, an informed witnessed consent was obtained from the patient's spouse and daughter. The risks of intracranial hemorrhage of 10%, worsening neurological deficit, ventilator dependency, death and inability to revascularize were all reviewed in detail with the patient's spouse and  daughter. The patient was then put under general anesthesia by the Department of Anesthesiology at [facility]. The right groin was prepped and draped in the usual sterile fashion. Thereafter using modified Seldinger technique, transfemoral access into the right common femoral artery was obtained without difficulty. Over a 0.035 inch guidewire a 5 French Pinnacle sheath was inserted. Through this, and also over a 0.035 inch guidewire a 5 [place] JB 1 catheter was advanced to the aortic arch region and selectively positioned in the left common carotid artery. FINDINGS: The left common carotid arteriogram demonstrates mild stenosis at the origin of the left external carotid artery. More distally, the left external carotid artery branches are seen to opacify normally. The left internal carotid artery at the bulb and just distal to the bulb opacifies with stagnation of blood flow. There slow ascent of contrast into the proximal left internal carotid artery in the cervical region. More distally, there is no reconstitution of the left internal carotid artery. No reconstitution is noted from the left external carotid artery branches either. PROCEDURE: ENDOVASCULAR REVASCULARIZATION OF OCCLUDED LEFT INTERNAL CAROTID ARTERY EXTRA CRANIALLY AND INTRACRANIALLY WITH 1 PASS USING THE 5 MM X 33 MM EMBOTRAP RETRIEVAL DEVICE, AND ALSO OF THE LEFT MIDDLE CEREBRAL ARTERY SUPERIOR DIVISION WITH 1 PASS USING THE 5 MM X 33 MM EMBOTRAP DEVICE ACHIEVING A TICI 3 REVASCULARIZATION. The diagnostic JB 1 catheter in the left common carotid artery was exchanged over a 0.035 inch 300 cm Rosen exchange guidewire for an 8 French 55 cm Brite tip neurovascular sheath using biplane roadmap technique and constant fluoroscopic guidance. Good aspiration was obtained from the hub of the 8 French neurovascular sheath. This was then connected to continuous heparinized saline infusion. Over the [employer] guidewire, an 8 [place] 85 cm FlowGate  balloon guide catheter which had been prepped with 50% contrast and 50% heparinized saline infusion was advanced and positioned just proximal to the left common carotid bifurcation. The guidewire was removed. Good aspiration obtained from the hub of the [facility] guide catheter. A gentle contrast injection demonstrated no evidence of dissection or of intraluminal filling defects. Over a 0.014 inch Softip Synchro micro guidewire, a combination of a 115 cm 5 [place] Navien guide catheter inside of which was an 021 Trevo ProVue microcatheter was advanced to the distal end of the [facility] guide catheter. The combination was then advanced with the micro guidewire leading with a J-tip configuration without any difficulty. The combination was advanced to the supraclinoid left ICA. There, the micro guidewire was removed.  Slow aspiration of blood was noted at the hub of the microcatheter. A gentle contrast injection through the microcatheter demonstrated faint opacification of the left MCA M1 region. The micro guidewire was then reinserted. With a torque device, the micro guidewire was advanced into the M1 M2 junction of the inferior division of the left middle cerebral artery followed by the microcatheter. The guidewire was removed. Good aspiration obtained from the hub of the microcatheter. A gentle contrast injection demonstrated safe position of tip of the microcatheter. This was then connected to continuous heparinized saline infusion. A gentle control arteriogram performed through the Navien guide catheter in the proximal cavernous segment demonstrated complete angiographic nonvisualization at the level of the ophthalmic artery. A 5 mm x 33 mm Embotrap retrieval device was then advanced in a coaxial manner and with constant heparinized saline infusion to the distal end of the microcatheter. The proximal and the distal landing zones were then defined. The O ring on the delivery microcatheter was then loosened. With slight  forward gentle traction with the right hand on the delivery micro guidewire, with the left hand the retrieval device was then retrieved. A gentle control arteriogram performed through the Navien 5 [place] guide catheter in the left internal carotid artery proximal cavernous segment demonstrated continued nonvisualization of the distal left internal carotid artery in the left middle cerebral artery distribution. Proximal flow arrest was then initiated by advancing the [facility] guide catheter into the proximal left internal carotid artery and inflating the balloon. With constant aspiration being applied at the hub of the [facility] guide catheter with a 60 mL syringe, and the Navien guide catheter with a Penumbra vacuum suction device, the combination of the retrieval device, the microcatheter and the 5 [place] Navien guide catheter were retrieved and removed. Aspiration was continued as the proximal flow arrest was then reversed by deflating the balloon of the [facility] guide catheter. Free aspiration of blood was noted at the hub of the [facility] guide catheter. A gentle control arteriogram performed through the [facility] guide catheter in the proximal left internal carotid artery demonstrated complete revascularization of the occluded left internal carotid artery proximally and distally. A wide left posterior communicating artery was seen opacifying the left posterior cerebral artery distribution. The left anterior and the left middle cerebral arteries were now widely patent. There continued to be a filling defect noted in the superior division of the left middle cerebral artery. The combination of the 5 [place] Navien guide catheter inside of which was an 021 Trevo ProVue microcatheter was again advanced using biplane roadmap technique and constant fluoroscopic guidance over a 0.014 inch Softip Synchro micro guidewire to the supraclinoid left ICA. The micro guidewire was then gently manipulated with the torque device and  advanced into the occluded superior division with a filling defect in the M2 M3 region followed by the microcatheter. The guidewire was removed. There was slow aspiration of blood at the hub of the microcatheter. A gentle control arteriogram through the microcatheter demonstrated safe position of tip of the microcatheter. The 5 mm x 33 mm Embotrap retrieval device was again advanced as described above to the distal end of the microcatheter. The O ring on the delivery microcatheter was then loosened. With slight forward gentle traction with the right hand the delivery micro guidewire with the left hand the delivery microcatheter was retrieved deploying the retrieval device. A control arteriogram performed through the Navien guide catheter in the distal cavernous left ICA demonstrated a TICI 3 revascularization. With proximal  flow arrest again initiating the left internal carotid artery by inflating the FlowGate balloon guide catheter, the combination of the retrieval device, and the microcatheter and the 5 [place] Navien guide catheter were then retrieved and removed as constant aspiration was applied with a 60 mL syringe at the hub of the [facility] [facility] guide catheter, and a Penumbra suction vacuum device at the hub of the Navien guide catheter. Proximal flow arrest was continued as the balloon was deflated in the left internal carotid artery. Clot was noted in the aspirate, and also in the retrieval device. Earlier the aspirate had revealed copious amounts of dark clot at the time of the first pass. A control arteriogram performed through the 8 [place] FlowGate guide catheter in the left internal carotid artery demonstrated a complete revascularization of the previously noted filling defect within the superior division M2 segment of the left middle cerebral artery. Overall a TICI 3 revascularization had been established. Moderate vasospasm at the superior division of the left middle cerebral artery proximally responded to  2 aliquots of 25 mcg nitroglycerin intra-arterially in the left internal carotid artery. Throughout the procedure, the patient's blood pressure and neurological status remained stable. No evidence of bradycardia or mass-effect or extravasation of contrast was noted. The FlowGate balloon guide catheter, and the 8 French Brite tip sheath were then removed over a 0.035 inch guidewire and replaced with an 8 [place] Pinnacle sheath. This was left in situ and connected to continuous heparinized saline infusion. The right groin appeared soft without evidence of hematoma. Distal pulses remained palpable in the dorsalis pedis, and posterior tibial regions on the right side, and Dopplerable DP and PT on the left side unchanged. Patient's CT angiogram revealed no evidence of a gross hemorrhage or mass effect or midline shift. The patient was left intubated on account of his pulmonary fibrosis. He was then transferred to the neuro ICU to continue with further post revascularization of large vessel ischemic stroke. IMPRESSION: Status post endovascular complete revascularization of occluded left internal carotid artery extra cranially and intracranially with 1 pass with the 5 mm x 33 mm Embotrap retrieval device. Status post endovascular complete revascularization of occluded superior division of the left middle cerebral artery with 1 pass with a 5 mm x 33 mm Embotrap retrieval device achieving a TICI 3 revascularization. PLAN: Follow-up in clinic 1 month post discharge. Electronically Signed   By: Luanne Bras M.D.   On: 03/24/2018 11:13   Ct Head Code Stroke Wo Contrast  Result Date: 03/23/2018 CLINICAL DATA:  Code stroke.  Right-sided weakness, aphasia EXAM: CT HEAD WITHOUT CONTRAST TECHNIQUE: Contiguous axial images were obtained from the base of the skull through the vertex without intravenous contrast. COMPARISON:  None. FINDINGS: Brain: Moderate atrophy. Patchy hypodensity in the cerebral white matter bilaterally  compatible with mild chronic microvascular ischemia. 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 Negative for acute hemorrhage.  No acute infarct or mass. Vascular: Hyperdense left MCA. Hyperdense terminal left internal carotid artery. Atherosclerotic calcification cavernous carotid bilaterally. Skull: Negative Sinuses/Orbits: Paranasal sinuses clear. Bilateral cataract surgery. Other: None ASPECTS ([place] Stroke Program Early CT Score) - Ganglionic level infarction (caudate, lentiform nuclei, internal capsule, insula, M1-M3 cortex): 7 - Supraganglionic infarction (M4-M6 cortex): 3 Total score (0-10 with 10 being normal): 10 IMPRESSION: 1. Hyperdense terminal left internal carotid artery and left MCA compatible with thrombosis. No evidence of acute infarct or hemorrhage 2. Atrophy and chronic microvascular ischemic changes in the white  matter. 3. ASPECTS is 10 4. These results were called by telephone at the time of interpretation on 03/23/2018 at 6:33 pm to Dr. Leonel Ramsay, who verbally acknowledged these results. Electronically Signed   By: Franchot Gallo M.D.   On: 03/23/2018 18:35    Review of Systems  Unable to perform ROS: Intubated   Blood pressure 100/62, pulse 86, temperature 99.5 F (37.5 C), temperature source Axillary, resp. rate (!) 27, height 5\' 7"  (1.702 m), weight 74.8 kg, SpO2 98 %. Physical Exam  Constitutional:  In critical care in 4N ICU, intubated. RN at bedside.   HENT:  Head: Normocephalic.  Cardiovascular: Normal rate.  By bedside monitor  Respiratory:  Non-coarse on vent  GI: Soft.  Genitourinary:  Genitourinary Comments: Some small, mostly dried blood at meatus and around in situ small bore catheter, urine in foley back very light pink.   Neurological:  GCS 3T  Skin: Skin is warm.   BEDSIDE CATHETER EXCHANGE / IRRIGATION:  Using aseptic technique in situ catheter removed as of inadequate caliber for tamponade and new 52F hematuria coude  catheter placed with 10cc sterile water in balloon. Irrigation port plugged. Hand-irrigated in 60cc aliquotes with sterile NS with 240cc to completely clear, no significant clots or blood present in urinary bladder. Then connected to straight drain.   Assessment/Plan:  1 - Gross Hematuria - this most likely from minor urethral / prostatic urethral trauma from prior I/O cath. This will continue to improve with large catheter now in place and off tPA (will tamponade area of mild ooze). This does NOT require any sort of bladder irrigation as not bladder source bleeding. Some amount of peri-urethral continued spotting IS expected over next few days.  OK to DC foley once extubated and has mental capacity to use bedside urinal.   2 - Prostate Screening - no indication for further screening as eval this year reassuring at his age and level of comorbidity.   Please call me directly with questions anytime.    Jessicah Croll 03/25/2018, 1:18 PM

## 2018-03-25 NOTE — Anesthesia Postprocedure Evaluation (Signed)
Anesthesia Post Note  Patient: Terry Peck  Procedure(s) Performed: RADIOLOGY WITH ANESTHESIA (N/A )     Patient location during evaluation: NICU Anesthesia Type: General Level of consciousness: sedated Pain management: pain level controlled Vital Signs Assessment: post-procedure vital signs reviewed and stable Respiratory status: patient remains intubated per anesthesia plan Cardiovascular status: stable Postop Assessment: no apparent nausea or vomiting Anesthetic complications: no    Last Vitals:  Vitals:   03/25/18 0600 03/25/18 0700  BP: (!) 145/77 (!) 154/72  Pulse: 89 86  Resp: (!) 28 (!) 28  Temp:    SpO2: 97% 97%    Last Pain:  Vitals:   03/25/18 0400  TempSrc: Axillary                 Theodosia Bahena S

## 2018-03-25 NOTE — Progress Notes (Signed)
Pt pulled foley out around 2300. At 0500, bladder scan showed > 566ml, In & Out cath showed blood. Notified MD- urology cart is at pt door and ready for foley placement/ CBI.

## 2018-03-25 NOTE — Progress Notes (Signed)
  Echocardiogram 2D Echocardiogram has been performed.  Terry Peck 03/25/2018, 8:46 AM

## 2018-03-25 NOTE — Progress Notes (Signed)
OT Cancellation Note  Patient Details Name: GARDNER SERVANTES MRN: [id number] DOB: Nov 07, 1940   Cancelled Treatment:    Reason Eval/Treat Not Completed: Patient not medically ready, respiratory distress and desats with any movement. Acute OT to return as able, and as appropriate. Will continue to follow.   Delight Stare, OT Acute Rehabilitation Services Pager [phone] Office [phone]    Delight Stare 03/25/2018, 10:13 AM

## 2018-03-25 NOTE — Progress Notes (Signed)
Overnight pt removed foley and required I/O at 6AM with 100 mL frank blood returned. Bladder scan volume of 753mL at 0830AM. Urology aware of bladder volume and increased discomfort but told CCM MD would evaluate in afternoon. Pt requiring increased O2 demands throughout morning. Intubation required at 10AM with continued decreased sats on bipap. During intubated MD noted distended and hard bladder, new bladder scan showing >962mL. 6N called immediately for placement of coude d/t no urology evaluation. On placement, return of 1500 mL red urine.  Pt now comfortable, intubated and sedated. Will continue to monitor pt.   1 mg versed wasted with Brittney RN, will waste in pyxis  During afternoon pt self extubated with mitts in place and sedation on. Pt emergently bagged and reintubated by Dr Loanne Drilling. Pt stable throughout procedure. Pt sedation restarted and bilateral wrist restraints placed. Pts son called and updated. Will continue to monitor pt.  1 mg versed wasted with Lauree Chandler RN, will waste in pyxis

## 2018-03-26 DIAGNOSIS — R310 Gross hematuria: Secondary | ICD-10-CM

## 2018-03-26 DIAGNOSIS — J849 Interstitial pulmonary disease, unspecified: Secondary | ICD-10-CM

## 2018-03-26 DIAGNOSIS — J9621 Acute and chronic respiratory failure with hypoxia: Secondary | ICD-10-CM

## 2018-03-26 DIAGNOSIS — R319 Hematuria, unspecified: Secondary | ICD-10-CM

## 2018-03-26 DIAGNOSIS — I63232 Cerebral infarction due to unspecified occlusion or stenosis of left carotid arteries: Secondary | ICD-10-CM

## 2018-03-26 LAB — GLUCOSE, CAPILLARY
GLUCOSE-CAPILLARY: 207 mg/dL — AB (ref 70–99)
Glucose-Capillary: 158 mg/dL — ABNORMAL HIGH (ref 70–99)
Glucose-Capillary: 167 mg/dL — ABNORMAL HIGH (ref 70–99)
Glucose-Capillary: 210 mg/dL — ABNORMAL HIGH (ref 70–99)
Glucose-Capillary: 235 mg/dL — ABNORMAL HIGH (ref 70–99)
Glucose-Capillary: 237 mg/dL — ABNORMAL HIGH (ref 70–99)

## 2018-03-26 LAB — BASIC METABOLIC PANEL
Anion gap: 13 (ref 5–15)
BUN: 17 mg/dL (ref 8–23)
CO2: 22 mmol/L (ref 22–32)
Calcium: 8.7 mg/dL — ABNORMAL LOW (ref 8.9–10.3)
Chloride: 104 mmol/L (ref 98–111)
Creatinine, Ser: 0.87 mg/dL (ref 0.61–1.24)
GFR calc Af Amer: >60 mL/min (ref >60)
GFR calc non Af Amer: >60 mL/min (ref >60)
Glucose, Bld: 233 mg/dL — ABNORMAL HIGH (ref 70–99)
Potassium: 4 mmol/L (ref 3.5–5.1)
Sodium: 139 mmol/L (ref 135–145)

## 2018-03-26 LAB — POCT I-STAT 3, ART BLOOD GAS (G3+)
Acid-base deficit: 1 mmol/L (ref 0.0–2.0)
Bicarbonate: 23.9 mmol/L (ref 20.0–28.0)
O2 Saturation: 95 %
Patient temperature: 98.5
TCO2: 25 mmol/L (ref 22–32)
pCO2 arterial: 38.3 mmHg (ref 32.0–48.0)
pH, Arterial: 7.403 (ref 7.350–7.450)
pO2, Arterial: 73 mmHg — ABNORMAL LOW (ref 83.0–108.0)

## 2018-03-26 LAB — CBC
HCT: 35 % — ABNORMAL LOW (ref 39.0–52.0)
Hemoglobin: 10.9 g/dL — ABNORMAL LOW (ref 13.0–17.0)
MCH: 28.8 pg (ref 26.0–34.0)
MCHC: 31.1 g/dL (ref 30.0–36.0)
MCV: 92.6 fL (ref 80.0–100.0)
Platelets: 183 10*3/uL (ref 150–400)
RBC: 3.78 MIL/uL — AB (ref 4.22–5.81)
RDW: 14.6 % (ref 11.5–15.5)
WBC: 13.3 10*3/uL — ABNORMAL HIGH (ref 4.0–10.5)
nRBC: 0 % (ref 0.0–0.2)

## 2018-03-26 LAB — PHOSPHORUS
Phosphorus: 2.2 mg/dL — ABNORMAL LOW (ref 2.5–4.6)
Phosphorus: 2.1 mg/dL — ABNORMAL LOW (ref 2.5–4.6)

## 2018-03-26 LAB — MAGNESIUM
Magnesium: 1.9 mg/dL (ref 1.7–2.4)
Magnesium: 2 mg/dL (ref 1.7–2.4)

## 2018-03-26 LAB — CULTURE, RESPIRATORY W GRAM STAIN: Culture: NORMAL

## 2018-03-26 LAB — CULTURE, RESPIRATORY

## 2018-03-26 MED ORDER — VITAL AF 1.2 CAL PO LIQD
1000.0000 mL | ORAL | Status: DC
  Administered 2018-03-26 – 2018-03-28 (×2): 1000 mL
  Filled 2018-03-26: qty 1000

## 2018-03-26 MED ORDER — VITAL HIGH PROTEIN PO LIQD: 1000.0000 mL | ORAL | Status: DC

## 2018-03-26 NOTE — Progress Notes (Signed)
Inpatient Diabetes Program Recommendations  AACE/ADA: New Consensus Statement on Inpatient Glycemic Control (2015)  Target Ranges:  Prepandial:   less than 140 mg/dL      Peak postprandial:   less than 180 mg/dL (1-2 hours)      Critically ill patients:  140 - 180 mg/dL   Lab Results  Component Value Date   GLUCAP 158 (H) 03/26/2018   HGBA1C 7.0 (H) 03/24/2018    Review of Glycemic Control Results for MARCELLINO, FIDALGO (MRN [id number]) as of 03/26/2018 12:05  Ref. Range 03/26/2018 03:18 03/26/2018 08:05 03/26/2018 11:38  Glucose-Capillary Latest Ref Range: 70 - 99 mg/dL 210 (H) 207 (H) 158 (H)   Diabetes history: Type 2 DM Outpatient Diabetes medications: Lantus 50 units QD, Metformin 2000 mg QAM, Actos 30 mg QAM Current orders for Inpatient glycemic control: Novolog 0-15 units Q4H Solumedrol 1000 mg QD/ 68ml/1hr  Inpatient Diabetes Program Recommendations:    If plan is to initate tube feeds, consider adding Novolog 3 units Q4H for tube feed coverage (Hold if tube feeds are stopped for any reason).  Thanks, Bronson Curb, MSN, RNC-OB Diabetes Coordinator [phone] (8a-5p)

## 2018-03-26 NOTE — Evaluation (Signed)
Physical Therapy Evaluation Patient Details Name: Terry Peck MRN: [id number] DOB: Dec 29, 1940 Today's Date: 03/26/2018   History of Present Illness  Terry Peck is an 77 y.o. male HTN, DM, pulmonary fibrosis, HLD, CHF who presented to [facility] ED as a code stroke with c/o of right facial droop, right sided weakness and aphasia. 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 Intubated 12/1, extubated 12/2, then re-intubated 12/3, self extubated later on 12/3, emergently re-intubated on 12/3.  Clinical Impression  Pt admitted with above. Pt was indep and functioning without AD PTA. Pt now presenting with impaired cognition, command follow, delayed processing and inability to sequence consistently. Pt was able to tolerate sitting and standing at EOB with modAx2. Pt very pleasant and cooperative with PT. Pt is an excellent candidate for CIR upon d/c from maximal functional recovery. Acute PT to follow.    Follow Up Recommendations CIR    Equipment Recommendations  (TBD)    Recommendations for Other Services Rehab consult     Precautions / Restrictions Precautions Precautions: Fall Precaution Comments: intubated/vent Restrictions Weight Bearing Restrictions: No      Mobility  Bed Mobility Overal bed mobility: Needs Assistance Bed Mobility: Supine to Sit     Supine to sit: Mod assist;+2 for physical assistance     General bed mobility comments: HOB elevated, with verbal and tactile cues pt able to bring LEs to L EOB, pt reached for PT to pull trunk up, modAx2 to complete transfer safely to EOB, RN to hold vent  Transfers Overall transfer level: Needs assistance Equipment used: 2 person hand held assist Transfers: Sit to/from Stand Sit to Stand: Mod assist;+2 physical assistance(RN to manage vent)         General transfer comment: pt intiated transfer, modA x2 to achieve full upright standing, no knee buckling bilaterally  Ambulation/Gait             General Gait  Details: pt attempted to take 4 side steps to the L towards HOB with max verbal cues, pt with minimal step height and required assist for lateral weight shifting  Stairs            Wheelchair Mobility    Modified Rankin (Stroke Patients Only) Modified Rankin (Stroke Patients Only) Pre-Morbid Rankin Score: No symptoms Modified Rankin: Moderately severe disability     Balance Overall balance assessment: Needs assistance Sitting-balance support: Feet supported;Bilateral upper extremity supported Sitting balance-Leahy Scale: Fair Sitting balance - Comments: required min/modA to maintain EOB   Standing balance support: Bilateral upper extremity supported Standing balance-Leahy Scale: Poor Standing balance comment: dependent on physical assist                             Pertinent Vitals/Pain Pain Assessment: Faces Faces Pain Scale: No hurt    Home Living Family/patient expects to be discharged to:: Private residence Living Arrangements: Spouse/significant other Available Help at Discharge: Family;Available 24 hours/day Type of Home: House Home Access: Stairs to enter Entrance Stairs-Rails: None Entrance Stairs-Number of Steps: 5 Home Layout: One level Home Equipment: Other (comment)(oxgyen )      Prior Function Level of Independence: Independent         Comments: driving, 1-5V oxygen most of the time       Hand Dominance   Dominant Hand: Right    Extremity/Trunk Assessment   Upper Extremity Assessment Upper Extremity Assessment: Defer to OT evaluation    Lower Extremity Assessment  Lower Extremity Assessment: Generalized weakness(gross incoordination)    Cervical / Trunk Assessment Cervical / Trunk Assessment: Normal  Communication   Communication: (intubated)  Cognition Arousal/Alertness: Awake/alert Behavior During Therapy: WFL for tasks assessed/performed Overall Cognitive Status: Impaired/Different from baseline Area of Impairment:  Following commands;Safety/judgement;Problem solving                       Following Commands: Follows one step commands with increased time;Follows one step commands inconsistently Safety/Judgement: Decreased awareness of safety;Decreased awareness of deficits   Problem Solving: Slow processing;Decreased initiation;Difficulty sequencing;Requires verbal cues;Requires tactile cues General Comments: pt appears to have some aphasia, when patient asked to don sock pt shook head yes but uanble to sequence to complete, pt with simple command follow about 50% of time      General Comments General comments (skin integrity, edema, etc.): pt with skin leasions on cheek from vent    Exercises     Assessment/Plan    PT Assessment Patient needs continued PT services  PT Problem List Decreased strength;Decreased activity tolerance;Decreased balance;Decreased mobility;Decreased coordination;Decreased cognition;Decreased knowledge of use of DME;Decreased safety awareness       PT Treatment Interventions DME instruction;Gait training;Stair training;Functional mobility training;Neuromuscular re-education;Balance training;Therapeutic exercise;Therapeutic activities;Cognitive remediation    PT Goals (Current goals can be found in the Care Plan section)  Acute Rehab PT Goals Patient Stated Goal: didn't state PT Goal Formulation: With patient/family Time For Goal Achievement: Apr 30, 2018 Potential to Achieve Goals: Good    Frequency Min 4X/week   Barriers to discharge        Co-evaluation PT/OT/SLP Co-Evaluation/Treatment: Yes Reason for Co-Treatment: Complexity of the patient's impairments (multi-system involvement) PT goals addressed during session: Mobility/safety with mobility         AM-PAC PT "6 Clicks" Mobility  Outcome Measure Help needed turning from your back to your side while in a flat bed without using bedrails?: A Lot Help needed moving from lying on your back to sitting  on the side of a flat bed without using bedrails?: A Lot Help needed moving to and from a bed to a chair (including a wheelchair)?: A Lot Help needed standing up from a chair using your arms (e.g., wheelchair or bedside chair)?: A Lot Help needed to walk in hospital room?: A Lot Help needed climbing 3-5 steps with a railing? : Total 6 Click Score: 11    End of Session Equipment Utilized During Treatment: (vent) Activity Tolerance: Patient tolerated treatment well Patient left: in bed;with call bell/phone within reach;with nursing/sitter in room;with family/visitor present(bed in chair position) Nurse Communication: Mobility status PT Visit Diagnosis: Unsteadiness on feet (R26.81);Difficulty in walking, not elsewhere classified (R26.2)    Time: 6387-5643 PT Time Calculation (min) (ACUTE ONLY): 26 min   Charges:   PT Evaluation $PT Eval Moderate Complexity: 1 Mod          Kittie Plater, PT, DPT Acute Rehabilitation Services Pager #: [phone] Office #: [phone]   Berline Lopes 03/26/2018, 2:16 PM

## 2018-03-26 NOTE — Progress Notes (Addendum)
NAME:  LOUISE VICTORY, MRN:  [id number], DOB:  04/06/1941, LOS: 3 ADMISSION DATE:  03/23/2018, CONSULTATION DATE:  03/23/2018 REFERRING MD:  Dr. 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 S/p tPA and reperfusion. PCCM consulted for ventilation management.  Past Medical History  Pulmonary fibrosis, HTN, DM, chronic diastolic heart failure   Significant Hospital Events   12/1 > Admitted for weakness and aphasia. Found with left ICA and Left M2 occlusion s/p tPA and revascularization 12/2 > Extubated without complication 92/4 > Worsening respiratory status requiring re-intubation. Patient self-extubated and required emergent re-intubation Consults:  Neurology 12/1 PCCM 12/1  Urology 12/3  Procedures:  --ETT 12/1 >> 12/2 --Reintubated 12/3>>Self-extubated on same day>>Reintubated for hypoxemia   Significant Diagnostic Tests:  CT Head 12/1 > 1. Hyperdense terminal left internal carotid artery and left MCA compatible with thrombosis. No evidence of acute infarct or Hemorrhage Atrophy and chronic microvascular ischemic changes in the white matter. CTA Head/Neck 12/1 > 1. 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 Reconstitution of the supraclinoid left internal carotid artery through the anterior communicating artery. There is clot in the left M1 and M2 segments with decreased blood flow. There is occlusion of the posterior division of the left middle cerebral artery due to Clot. 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 Moderate stenosis origin of left vertebral artery Probable fetal origin left posterior cerebral artery which is supplied from a small hypoplastic left P1 segment.  Micro Data:  U/A 12/1 >> Tracheal Asp 12/1 >>  Antimicrobials:  N/A    Interim  history/subjective:  Extubated on 26/8 without complication to nasal cannula. Respiratory status declined yesterday and patient required re-intubation. Unfortunately, patient self-extubated and had severe hypoxemia requiring emergent re-intubation.   No event overnight. Denies discomfort or pain. Follows commands  Objective   Blood pressure (!) 129/54, pulse 75, temperature 98.5 F (36.9 C), temperature source Axillary, resp. rate 20, height 5\' 7"  (1.702 m), weight 74.1 kg, SpO2 94 %.    Vent Mode: PRVC FiO2 (%):  [50 %-100 %] 50 % Set Rate:  [15 bmp] 15 bmp Vt Set:  [520 mL] 520 mL PEEP:  [5 cmH20] 5 cmH20 Plateau Pressure:  [18 cmH20] 18 cmH20   Intake/Output Summary (Last 24 hours) at 03/26/2018 1117 Last data filed at 03/26/2018 1000 Gross per 24 hour  Intake 1155.37 ml  Output 3150 ml  Net -1994.63 ml   Filed Weights   03/24/18 0500 03/25/18 0500 03/26/18 0500  Weight: 75.1 kg 74.8 kg 74.1 kg    Examination: General: Chronically ill-appearing male, laying in bed, on ventilator  HENT: [place], AT, ETT in place Eyes: EOMI, no scleral icterus Lungs: Bibasilar crackles, no wheezing CV: RRR, no M/R/G Abdomen: BS +, and TTP Extremities: No edema, no cyanosis Neuro: Awake and alert, follows commands.  Upper extremity strength 4/5.  Lower extremity strength 4/5 GU: Foley in place  Resolved Hospital Problem list   Hyperkalemia  Assessment & Plan:   #Acute on chronic hypoxemic respiratory failure #Pulmonary Fibrosis Exacerbation #Suspected pulmonary edema #Hx of Aspiration  Dr. Lenna Gilford is his pulmonologist.  On 2 L via [place] at home. Recent TTE suggestive of PH. 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 Plan  - Continue mechanical ventilation.  Wean as tolerated with O2 saturation goal greater than  88% - Continue nebulizers: Pulmicort and Brovana BID, PRN albuterol - Continue Solu-Medrol x 3d with plan for steroid taper - Continue levofloxacin for coverage of community-acquired and  atypical pneumonia - Continue daily PPI - Diurese when off pressors  #Hypotension Requiring minimal pressor support. Likely secondary to sedation.  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 BP goal <140 - Continue ASA  #Hematuria - improving Recent trauma in setting of recent tPA administration - Urology consulted. No intervention indicated.   #HTN #S/P Bi-V pacer/ICD, Followed by [facility]  #H/O G1 Diastolic HF, HLD Plan  - Telemetry - BP goal <140 post procedure per Neurology - Continue home Lipitor  - Continue Coreg - Hold home Lasix, Cozaar  DM Plan  -Trend Glucose -SSI   Best practice:  Diet: NPO Pain/Anxiety/Delirium protocol VAP protocol DVT prophylaxis: SCD GI prophylaxis: PPI Glucose control: SSI Mobility: Bedrest Code Status: FC Family Communication: Family at bedside (Son, Daughter, Wife) Disposition: Continue ICU Care  Labs   CBC: Recent Labs  Lab 03/23/18 1831 03/23/18 1839 03/24/18 0403 03/25/18 0632 03/25/18 1648 03/26/18 0423  WBC 7.8  --  9.7 13.5* 13.9* 13.3*  NEUTROABS 4.8  --  8.9*  --   --   --   HGB 14.3 15.3 12.4* 11.7* 11.2* 10.9*  HCT 45.2 45.0 41.0 36.7* 35.5* 35.0*  MCV 94.0  --  94.9 92.2 93.2 92.6  PLT 208  --  232 203 203 979    Basic Metabolic Panel: Recent Labs  Lab 03/23/18 2231 03/24/18 0403 03/25/18 0632 03/25/18 1648 03/26/18 0423  NA 134* 138 138 140 139  K 3.9 4.2 3.9 3.6 4.0  CL 107 108 108 107 104  CO2 20* 20* 22 25 22   GLUCOSE 159* 195* 149* 113* 233*  BUN 22 23 16 15 17   CREATININE 0.91 0.90 0.90 1.00 0.87  CALCIUM 7.6* 8.3* 8.6* 8.5* 8.7*  MG 1.9 2.0  --   --   --   PHOS  --  3.4  --   --   --    GFR: Estimated Creatinine Clearance: 66.5 mL/min (by C-G formula based on SCr of 0.87 mg/dL). Recent Labs  Lab  03/23/18 2230 03/24/18 0403 03/25/18 0632 03/25/18 1648 03/26/18 0423  WBC  --  9.7 13.5* 13.9* 13.3*  LATICACIDVEN 1.0  --   --   --   --     ABG    Component Value Date/Time   PHART 7.403 03/26/2018 0407   PCO2ART 38.3 03/26/2018 0407   PO2ART 73.0 (L) 03/26/2018 0407   HCO3 23.9 03/26/2018 0407   TCO2 25 03/26/2018 0407   ACIDBASEDEF 1.0 03/26/2018 0407   O2SAT 95.0 03/26/2018 0407    CBG: Recent Labs  Lab 03/25/18 1526 03/25/18 1952 03/25/18 2341 03/26/18 0318 03/26/18 0805  GLUCAP 133* 75 189* 210* 207*   I reviewed the following imaging myself: CT Head 12/2 - Left MCA infarction CXR 03/24/18 - Chronic interstitial changes, left lower lobe opacity that may represent pneumonia however will need clinical correlation CXR 12/3 - ETT appropriately in place, chronic bilateral interstitial changes TTE 12/3 EF 65-70%. PA peak pressure:54 mmHg  Critical care time: 31 minutes     The patient is critically ill with multiple organ systems failure and requires high complexity decision making for  assessment and support, frequent evaluation and titration of therapies, application of advanced monitoring technologies and extensive interpretation of multiple databases.   Critical Care Time devoted to patient care services described in this note is 31 Minutes. This time reflects time of care of this signee Dr. Rodman Pickle. This critical care time does not reflect procedure time, or teaching time or supervisory time of PA/NP/Med student/Med Resident etc but could involve care discussion time.  Rodman Pickle, M.D. [facility] Pulmonary/Critical Care Medicine. Pager: [phone]. After hours pager: [phone].

## 2018-03-26 NOTE — Progress Notes (Addendum)
STROKE TEAM PROGRESS NOTE   SUBJECTIVE (INTERVAL HISTORY) His RN is at the bedside.  Patient was self-extubated yesterday afternoon and was re-intubated with now restrains. This am he is awake alert attending to both side, but still not following specific commands, moving all extremities. Still on vent. Gross hematuria resolved. Pacer interrogation no afib.    OBJECTIVE Temp:  [98.4 F (36.9 C)-99.5 F (37.5 C)] 98.5 F (36.9 C) (12/04 0800) Pulse Rate:  [60-92] 70 (12/04 0945) Cardiac Rhythm: Ventricular paced (12/04 0800) Resp:  [12-28] 18 (12/04 0945) BP: (82-162)/(39-84) 120/54 (12/04 0945) SpO2:  [87 %-100 %] 95 % (12/04 0945) FiO2 (%):  [50 %-100 %] 50 % (12/04 0800) Weight:  [74.1 kg] 74.1 kg (12/04 0500)  Recent Labs  Lab 03/25/18 1526 03/25/18 1952 03/25/18 2341 03/26/18 0318 03/26/18 0805  GLUCAP 133* 75 189* 210* 207*   Recent Labs  Lab 03/23/18 2231 03/24/18 0403 03/25/18 0632 03/25/18 1648 03/26/18 0423  NA 134* 138 138 140 139  K 3.9 4.2 3.9 3.6 4.0  CL 107 108 108 107 104  CO2 20* 20* 22 25 22   GLUCOSE 159* 195* 149* 113* 233*  BUN 22 23 16 15 17   CREATININE 0.91 0.90 0.90 1.00 0.87  CALCIUM 7.6* 8.3* 8.6* 8.5* 8.7*  MG 1.9 2.0  --   --   --   PHOS  --  3.4  --   --   --    Recent Labs  Lab 03/23/18 1831  AST 48*  ALT 28  ALKPHOS 33*  BILITOT 1.2  PROT 6.2*  ALBUMIN 3.1*   Recent Labs  Lab 03/23/18 1831 03/23/18 1839 03/24/18 0403 03/25/18 0632 03/25/18 1648 03/26/18 0423  WBC 7.8  --  9.7 13.5* 13.9* 13.3*  NEUTROABS 4.8  --  8.9*  --   --   --   HGB 14.3 15.3 12.4* 11.7* 11.2* 10.9*  HCT 45.2 45.0 41.0 36.7* 35.5* 35.0*  MCV 94.0  --  94.9 92.2 93.2 92.6  PLT 208  --  232 203 203 183   No results for input(s): CKTOTAL, CKMB, CKMBINDEX, TROPONINI in the last 168 hours. Recent Labs    03/23/18 1831 03/25/18 0632  LABPROT 14.7 13.8  INR 1.16 1.07   Recent Labs    03/23/18 2129  COLORURINE YELLOW  LABSPEC >1.046*   PHURINE 5.0  GLUCOSEU NEGATIVE  HGBUR NEGATIVE  BILIRUBINUR NEGATIVE  KETONESUR NEGATIVE  PROTEINUR NEGATIVE  NITRITE NEGATIVE  LEUKOCYTESUR NEGATIVE       Component Value Date/Time   CHOL 95 03/24/2018 0403   CHOL 104 07/17/2016 0814   TRIG 179 (H) 03/25/2018 0632   HDL 28 (L) 03/24/2018 0403   HDL 42 07/17/2016 0814   CHOLHDL 3.4 03/24/2018 0403   VLDL 20 03/24/2018 0403   LDLCALC 47 03/24/2018 0403   LDLCALC 42 07/17/2016 0814   Lab Results  Component Value Date   HGBA1C 7.0 (H) 03/24/2018   No results found for: LABOPIA, COCAINSCRNUR, LABBENZ, AMPHETMU, THCU, LABBARB  No results for input(s): ETH in the last 168 hours.  I have personally reviewed the radiological images below and agree with the radiology interpretations.  Ct Angio Head W Or Wo Contrast  Result Date: 03/23/2018 CLINICAL DATA:  Right-sided weakness, aphasia EXAM: CT ANGIOGRAPHY HEAD AND NECK TECHNIQUE: Multidetector CT imaging of the head and neck was performed using the standard protocol during bolus administration of intravenous contrast. Multiplanar CT image reconstructions and MIPs were obtained to evaluate the vascular  anatomy. Carotid stenosis measurements (when applicable) are obtained utilizing NASCET criteria, using the distal internal carotid diameter as the denominator. CONTRAST:  23mL ISOVUE-370 IOPAMIDOL (ISOVUE-370) INJECTION 76% COMPARISON:  CT head 03/23/2018 FINDINGS: CTA NECK FINDINGS Aortic arch: Atherosclerotic aortic arch without aneurysm. Mild atherosclerotic disease and tortuosity in the proximal great vessels without significant stenosis. Right carotid system: Scattered atherosclerotic disease in the right common carotid artery. Atherosclerotic calcified plaque in the right carotid bifurcation. 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 Left carotid system: Scattered atherosclerotic disease left common carotid artery without significant stenosis. Atherosclerotic  calcification left carotid bifurcation. There is decreased flow in the left common carotid artery due to acute occlusion of the proximal left internal carotid artery with thrombus present. There is no contrast distal to the proximal left internal carotid artery which is thrombosed. Left internal carotid artery is occluded to the supraclinoid segment. Vertebral arteries: Left vertebral artery dominant with moderate stenosis at the origin. Right vertebral non dominant without significant stenosis. Skeleton: Cervical spondylosis.  No acute skeletal abnormality. Other neck: Negative for mass or adenopathy in the neck. Upper chest: Transvenous pacemaker left. Interstitial fibrosis of the lungs bilaterally. No pleural effusion. Review of the MIP images confirms the above findings CTA HEAD FINDINGS Anterior circulation: Left internal carotid artery is occluded through the cavernous segment with reconstitution of the supraclinoid segment via flow from the anterior communicating artery. There is clot throughout the left M1 segment with slow flow. There is a small amount of flow in left M2 branches with occlusion of the posterior division of left M2. Both anterior cerebral arteries are patent. Right middle cerebral artery patent without stenosis. Atherosclerotic plaque in the right cavernous carotid with mild stenosis. Posterior circulation: Both vertebral arteries patent to the basilar. Left vertebral dominant. Right PICA patent. Left PICA not visualized. AICA patent bilaterally. Basilar widely patent. Superior cerebellar artery patent. Right posterior cerebral artery patent. Probable fetal origin of the left posterior cerebral artery with supply from a hypoplastic left P1 segment. Venous sinuses: Patent Anatomic variants: None Delayed phase: Not performed Review of the MIP images confirms the above findings IMPRESSION: 1. 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 Reconstitution of the supraclinoid left  internal carotid artery through the anterior communicating artery. There is clot in the left M1 and M2 segments with decreased blood flow. There is occlusion of the posterior division of the left middle cerebral artery due to clot. 2. 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 3. Moderate stenosis origin of left vertebral artery 4. Probable fetal origin left posterior cerebral artery which is supplied from a small hypoplastic left P1 segment. 5. These results were called by telephone at the time of interpretation on 03/23/2018 at 6:33 pm to Dr. Leonel Ramsay , who verbally acknowledged these results. Electronically Signed   By: Franchot Gallo M.D.   On: 03/23/2018 18:54   Ct Angio Neck W Or Wo Contrast  Result Date: 03/23/2018 CLINICAL DATA:  Right-sided weakness, aphasia EXAM: CT ANGIOGRAPHY HEAD AND NECK TECHNIQUE: Multidetector CT imaging of the head and neck was performed using the standard protocol during bolus administration of intravenous contrast. Multiplanar CT image reconstructions and MIPs were obtained to evaluate the vascular anatomy. Carotid stenosis measurements (when applicable) are obtained utilizing NASCET criteria, using the distal internal carotid diameter as the denominator. CONTRAST:  60mL ISOVUE-370 IOPAMIDOL (ISOVUE-370) INJECTION 76% COMPARISON:  CT head 03/23/2018 FINDINGS: CTA NECK FINDINGS Aortic arch: Atherosclerotic aortic arch without aneurysm. Mild atherosclerotic disease  and tortuosity in the proximal great vessels without significant stenosis. Right carotid system: Scattered atherosclerotic disease in the right common carotid artery. Atherosclerotic calcified plaque in the right carotid bifurcation. 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 Left carotid system: Scattered atherosclerotic disease left common carotid artery without significant stenosis. Atherosclerotic calcification left carotid bifurcation. There is decreased flow in the left  common carotid artery due to acute occlusion of the proximal left internal carotid artery with thrombus present. There is no contrast distal to the proximal left internal carotid artery which is thrombosed. Left internal carotid artery is occluded to the supraclinoid segment. Vertebral arteries: Left vertebral artery dominant with moderate stenosis at the origin. Right vertebral non dominant without significant stenosis. Skeleton: Cervical spondylosis.  No acute skeletal abnormality. Other neck: Negative for mass or adenopathy in the neck. Upper chest: Transvenous pacemaker left. Interstitial fibrosis of the lungs bilaterally. No pleural effusion. Review of the MIP images confirms the above findings CTA HEAD FINDINGS Anterior circulation: Left internal carotid artery is occluded through the cavernous segment with reconstitution of the supraclinoid segment via flow from the anterior communicating artery. There is clot throughout the left M1 segment with slow flow. There is a small amount of flow in left M2 branches with occlusion of the posterior division of left M2. Both anterior cerebral arteries are patent. Right middle cerebral artery patent without stenosis. Atherosclerotic plaque in the right cavernous carotid with mild stenosis. Posterior circulation: Both vertebral arteries patent to the basilar. Left vertebral dominant. Right PICA patent. Left PICA not visualized. AICA patent bilaterally. Basilar widely patent. Superior cerebellar artery patent. Right posterior cerebral artery patent. Probable fetal origin of the left posterior cerebral artery with supply from a hypoplastic left P1 segment. Venous sinuses: Patent Anatomic variants: None Delayed phase: Not performed Review of the MIP images confirms the above findings IMPRESSION: 1. 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 Reconstitution of the supraclinoid left internal carotid artery through the anterior communicating artery. There is  clot in the left M1 and M2 segments with decreased blood flow. There is occlusion of the posterior division of the left middle cerebral artery due to clot. 2. 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 3. Moderate stenosis origin of left vertebral artery 4. Probable fetal origin left posterior cerebral artery which is supplied from a small hypoplastic left P1 segment. 5. These results were called by telephone at the time of interpretation on 03/23/2018 at 6:33 pm to Dr. Leonel Ramsay , who verbally acknowledged these results. Electronically Signed   By: Franchot Gallo M.D.   On: 03/23/2018 18:54   Dg Chest Port 1 View  Result Date: 03/23/2018 CLINICAL DATA:  Post intubation and orogastric tube placement. EXAM: PORTABLE CHEST 1 VIEW COMPARISON:  02/18/2018 FINDINGS: Endotracheal tube has tip 2.9 cm above the carina. Placement of enteric tube with tip over the stomach in the left upper quadrant. Left-sided pacemaker unchanged. Lungs are hypoinflated demonstrate stable chronic bilateral interstitial disease. Mild prominence of the perihilar markings suggesting mild degree of vascular congestion cardiomediastinal silhouette and remainder of the exam is unchanged. IMPRESSION: Suggestion mild vascular congestion.  Chronic interstitial disease. Tubes and lines as described. Electronically Signed   By: Marin Olp M.D.   On: 03/23/2018 21:56   Ct Head Code Stroke Wo Contrast  Result Date: 03/23/2018 CLINICAL DATA:  Code stroke.  Right-sided weakness, aphasia EXAM: CT HEAD WITHOUT CONTRAST TECHNIQUE: Contiguous axial images were obtained from the base of the skull through  the vertex without intravenous contrast. COMPARISON:  None. FINDINGS: Brain: Moderate atrophy. Patchy hypodensity in the cerebral white matter bilaterally compatible with mild chronic microvascular ischemia. 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 Negative for acute hemorrhage.  No acute infarct or  mass. Vascular: Hyperdense left MCA. Hyperdense terminal left internal carotid artery. Atherosclerotic calcification cavernous carotid bilaterally. Skull: Negative Sinuses/Orbits: Paranasal sinuses clear. Bilateral cataract surgery. Other: None ASPECTS ([place] Stroke Program Early CT Score) - Ganglionic level infarction (caudate, lentiform nuclei, internal capsule, insula, M1-M3 cortex): 7 - Supraganglionic infarction (M4-M6 cortex): 3 Total score (0-10 with 10 being normal): 10 IMPRESSION: 1. Hyperdense terminal left internal carotid artery and left MCA compatible with thrombosis. No evidence of acute infarct or hemorrhage 2. Atrophy and chronic microvascular ischemic changes in the white matter. 3. ASPECTS is 10 4. These results were called by telephone at the time of interpretation on 03/23/2018 at 6:33 pm to Dr. Leonel Ramsay, who verbally acknowledged these results. Electronically Signed   By: Franchot Gallo M.D.   On: 03/23/2018 18:35   TTE - Full study not performed as pt requested study to be stopped   prior to completion; only partial parasternal and no   apical/subcostal images obtained; vigorous LV systolic function;   doppler incomplete.  Ct Head Wo Contrast Result Date: 03/24/2018 CLINICAL DATA:  77 y/o M; left ICA occlusion post intra-arterial intervention. EXAM: CT HEAD WITHOUT CONTRAST TECHNIQUE: Contiguous axial images were obtained from the base of the skull through the vertex without intravenous contrast. COMPARISON:  03/23/2018 CT head, CTA head, and cerebral angiogram. FINDINGS: Brain: Cortical hypoattenuation with loss of gray-white differentiation and mild edema of the left lateral temporal lobe, temporal operculum, insula left parietal lobe, and the lateral aspect of the left occipital lobe compatible with late acute/early subacute infarction. No acute hemorrhage. No extra-axial collection, hydrocephalus, midline shift, or herniation. No additional area of acute stroke. Stable  background of chronic microvascular ischemic changes and volume loss of the brain. 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 Vascular: Calcific atherosclerosis of carotid siphons and vertebral arteries. No hyperdense vessel identified. Skull: Normal. Negative for fracture or focal lesion. Sinuses/Orbits: No acute finding. Other: None. IMPRESSION: 1. 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 No hemorrhage or significant mass effect. 2. Stable background of chronic microvascular ischemic changes and volume loss of the brain. 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 These results will be called to the ordering clinician or representative by the Radiologist Assistant, and communication documented in the PACS or zVision Dashboard. Electronically Signed   By: Kristine Garbe M.D.   On: 03/24/2018 20:38    PHYSICAL EXAM  Temp:  [98.4 F (36.9 C)-99.5 F (37.5 C)] 98.5 F (36.9 C) (12/04 0800) Pulse Rate:  [60-92] 70 (12/04 0945) Resp:  [12-28] 18 (12/04 0945) BP: (82-162)/(39-84) 120/54 (12/04 0945) SpO2:  [87 %-100 %] 95 % (12/04 0945) FiO2 (%):  [50 %-100 %] 50 % (12/04 0800) Weight:  [74.1 kg] 74.1 kg (12/04 0500)  General - Well nourished, well developed, intubated and on low dose sedation.  Ophthalmologic - fundi not visualized due to noncooperation.  Cardiovascular - Regular rate and rhythm.  Neuro - intubated on low dose sedation, eyes open but not following specific commands. Eyes move to both directions, no gaze preference. Pupil 86mm bilaterally, reactive to light, not consistently blinking to visual threat bilaterally but attending objects to  both sides.  Bilateral corneal and cough reflex present.  Facial symmetry not able to test due to ET tube, did not following commands of tongue protrusion. Moving all extremities symmetrically. DTR 1+ and no babinski bilaterally.  Sensation, coordination and gait not tested.    ASSESSMENT/PLAN Mr. FLEM ENDERLE is a 77 y.o. male with history of CHF on pacer, pulmonary fibrosis, HTN, HLD admitted for right side weakness and aphasia. TPA given. 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  Ongoing aggressive stroke risk factor management  Therapy recommendations:  pending  Disposition:  Pending  Respiratory failure with pulmonary fibrosis  Following with pulmonary for PF as outpt  Intubated - extubated- reintubated - self extubated - re-intubated  CCM on board  Still on vent  Wean as able  On Levaquin for aspiration prevention  CHF with pacer  Followed Dr. Rayann Heman  EF 60-65% in 08/2016  TTE this time EF 65-70%  Pacer interrogation no afib  Diabetes  HgbA1c 7.0 goal < 7.0  Controlled  Hyperglycemia   CBG monitoring  SSI  Hypotension Hx of hypertension . Stable on the low end . On neo . On Coreg 12.5  Long term BP goal normotensive  Hyperlipidemia  Home meds:  lipitor 20   LDL 47, goal < 70  Now on lipitor 20  Continue statin at discharge  Gross hematuria  Likely due to uretal trauma  Difficulty foley insertion - urology put in catheter  Hematuria resolved this am  On ASA 325mg   Continue monitoring  Other Stroke Risk Factors  Advanced age  Other Active  Problems  Hyperkalemia - K+ 5.8->4.2-> 3.9->4.0  Leukocytosis WBC 9.7->13.5->13.3  Nutrition - will start tube feeding today  Hospital day # 3  This patient is critically ill due to acute stroke, s/p tPA and intervention, pulmonary fibrosis, respiratory failure and at significant risk of neurological worsening, death form recurrent stroke, hemorrhagic conversion, heart failure, seizure. This patient's care requires constant monitoring of vital signs, hemodynamics, respiratory and cardiac monitoring, review of multiple databases, neurological assessment, discussion with family, other specialists and medical decision making of high complexity. I spent 35 minutes of neurocritical care time in the care of this patient. I had long discussion with wife and son at bedside, updated pt current condition, treatment plan and potential prognosis. They expressed understanding and appreciation.    Rosalin Hawking, MD PhD Stroke Neurology 03/26/2018 10:55 AM    To contact Stroke Continuity provider, please refer to [email or web address] After hours, contact General Neurology

## 2018-03-26 NOTE — Progress Notes (Signed)
Initial Nutrition Assessment  DOCUMENTATION CODES:   Not applicable  INTERVENTION:   Vital AF 1.2 @ 60 ml/hr (1440 ml) via OGT  Provides: 1728 kcals, 108 grams protein, 1168 ml free water. Meets 100% of needs.   NUTRITION DIAGNOSIS:   Increased nutrient needs related to post-op healing as evidenced by estimated needs.  GOAL:   Patient will meet greater than or equal to 90% of their needs  MONITOR:   Diet advancement, Vent status, Labs, Weight trends, TF tolerance, I & O's  REASON FOR ASSESSMENT:   Consult Enteral/tube feeding initiation and management  ASSESSMENT:   Patient with PMH significant for HTN, DM, pulmonary fibrosis, HLD, CHF who presented to [facility] ED as a code stroke with c/o of right facial droop, right sided weakness and aphasia. Found to have left ICA and left MCA.    12/1- emergent revascularization  12/2- extubated  12/3- self extuabted and re-intubated due to respiratory failure   Pt discussed during ICU rounds and with RN. To begin TF today via OGT.  Family denies any loss in appetite PTA. States he eats three meals with snacks daily that consist of vegetables, ice cream, pasta, and meats.   Patient is currently intubated on ventilator support MV: 13.6 L/min Temp (24hrs), Avg:98.7 F (37.1 C), Min:98.4 F (36.9 C), Max:99.5 F (37.5 C) BP: 115/58 MAP: 74 Propofol: 4.5 ml/hr- provides 119 kcal   I/O: + 122 ml since admit UOP: 2.8 L x 24 hrs   Per family, pt's weight fluctuates but they have not noticed a significant amount of wt loss. Records indicate pt weighed 172 lb on 10/29 and 163 lb this admission.  It looks as if pt has had a gradual weight loss over the year. Unsure if this is related to fluid accumulation/loss verus dry wt loss.   Nutrition-Focused physical exam completed. Some muscle depletion is to be expected with advanced age. Unsure if depletions are a result of poor intake.   Medications reviewed and include: levophed,  propofol Labs reviewed: CBG 75-233  NUTRITION - FOCUSED PHYSICAL EXAM:    Most Recent Value  Orbital Region  No depletion  Upper Arm Region  Mild depletion  Thoracic and Lumbar Region  Unable to assess  Buccal Region  No depletion  Temple Region  Moderate depletion  Clavicle Bone Region  Moderate depletion  Clavicle and Acromion Bone Region  Mild depletion  Scapular Bone Region  Unable to assess  Dorsal Hand  Unable to assess [mits]  Patellar Region  Severe depletion  Anterior Thigh Region  Severe depletion  Posterior Calf Region  Severe depletion  Edema (RD Assessment)  None  Hair  Reviewed  Eyes  Reviewed  Mouth  Reviewed  Skin  Reviewed  Nails  Reviewed     Diet Order:   Diet Order            Diet NPO time specified  Diet effective now              EDUCATION NEEDS:   Not appropriate for education at this time  Skin:  Skin Assessment: Skin Integrity Issues: Skin Integrity Issues:: Incisions Incisions: IR sheath   Last BM:     Height:   Ht Readings from Last 1 Encounters:  03/23/18 5\' 7"  (1.702 m)    Weight:   Wt Readings from Last 1 Encounters:  03/26/18 74.1 kg    Ideal Body Weight:  67.3 kg  BMI:  Body mass index is 25.59 kg/m.  Estimated Nutritional Needs:   Kcal:  1724 kcal  Protein:  95-110 grams   Fluid:  >/= 1.5 L/day   Mariana Single RD, LDN Clinical Nutrition Pager # - [phone]

## 2018-03-26 NOTE — Evaluation (Signed)
Occupational Therapy Evaluation Patient Details Name: Terry Peck MRN: [id number] DOB: 1940/05/25 Today's Date: 03/26/2018    History of Present Illness Terry Peck is an 77 y.o. male HTN, DM, pulmonary fibrosis, HLD, CHF who presented to [facility] ED as a code stroke with c/o of right facial droop, right sided weakness and aphasia. 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 Intubated 12/1, extubated 12/2, then re-intubated 12/3, self extubated later on 12/3, emergently re-intubated on 12/3.   Clinical Impression   PTA patient independent with ADLs and driving.  Currently admitted for above and limited by problem list below, including weakness (R>L), decreased activity tolerance, impaired balance, aphasia, impaired sequencing, L gaze preference and impaired cognition. Pt attempting to wash face with sock, possible ideational apraxia? Patient requires mod assist for UB ADLs, max to total assist for LB ADLs, mod assist +2 for bed mobility and sit to stand transfers.  Will continue to follow while admitted and recommend CIR level rehab at dc in order to optimize independence with self care and mobility.      Follow Up Recommendations  CIR    Equipment Recommendations  Other (comment)(TBD at next venue of care)    Recommendations for Other Services Rehab consult     Precautions / Restrictions Precautions Precautions: Fall Precaution Comments: intubated/vent Restrictions Weight Bearing Restrictions: No      Mobility Bed Mobility Overal bed mobility: Needs Assistance Bed Mobility: Supine to Sit     Supine to sit: Mod assist;+2 for physical assistance     General bed mobility comments: HOB elevated, with verbal and tactile cues pt able to bring LEs to L EOB, pt reached for PT to pull trunk up, modAx2 to complete transfer safely to EOB, RN to hold vent  Transfers Overall transfer level: Needs assistance Equipment used: 2 person hand held assist Transfers: Sit to/from  Stand Sit to Stand: Mod assist;+2 physical assistance(RN to manage vent)         General transfer comment: pt intiated transfer, modA x2 to achieve full upright standing    Balance Overall balance assessment: Needs assistance Sitting-balance support: Feet supported;Bilateral upper extremity supported Sitting balance-Leahy Scale: Fair Sitting balance - Comments: min to mod assist to maintain EOB, fatigues easily with increased posterior lean   Standing balance support: Bilateral upper extremity supported;During functional activity Standing balance-Leahy Scale: Poor Standing balance comment: dependent on B UE and external support                           ADL either performed or assessed with clinical judgement   ADL Overall ADL's : Needs assistance/impaired Eating/Feeding: NPO   Grooming: Moderate assistance;Sitting   Upper Body Bathing: Moderate assistance;Sitting   Lower Body Bathing: Total assistance;+2 for physical assistance;Sit to/from stand   Upper Body Dressing : Total assistance;Sitting   Lower Body Dressing: Total assistance;+2 for physical assistance;Sit to/from Health and safety inspector Details (indicate cue type and reason): deferred         Functional mobility during ADLs: Moderate assistance;+2 for physical assistance;+2 for safety/equipment General ADL Comments: pt limited by generalized weakness, cognition, and decreased activity tolerance     Vision Baseline Vision/History: Wears glasses Wears Glasses: Distance only Additional Comments: difficult to assess, L gaze preference but able to scan; continue assessment     Perception     Praxis      Pertinent Vitals/Pain Pain Assessment: Faces Faces Pain Scale: No hurt  Hand Dominance Right   Extremity/Trunk Assessment Upper Extremity Assessment Upper Extremity Assessment: RUE deficits/detail;LUE deficits/detail(gross incoordination, L UE stronger than R ) RUE Deficits / Details:  grossly 3-/5 for functional use, edema  RUE Sensation: (continue assessment ) RUE Coordination: decreased fine motor;decreased gross motor LUE Deficits / Details: grossly 3+/5 MMT functionally, edema  LUE Sensation: (continue assessment ) LUE Coordination: decreased fine motor;decreased gross motor   Lower Extremity Assessment Lower Extremity Assessment: Defer to PT evaluation   Cervical / Trunk Assessment Cervical / Trunk Assessment: Normal   Communication Communication Communication: (intubated)   Cognition Arousal/Alertness: Awake/alert Behavior During Therapy: WFL for tasks assessed/performed Overall Cognitive Status: Difficult to assess Area of Impairment: Following commands;Safety/judgement;Problem solving                       Following Commands: Follows one step commands with increased time;Follows one step commands inconsistently Safety/Judgement: Decreased awareness of safety;Decreased awareness of deficits   Problem Solving: Slow processing;Decreased initiation;Difficulty sequencing;Requires verbal cues;Requires tactile cues General Comments: able to follow approx 50% of simple commands, but demonstrates difficulty sequencing tasks; presents with some aphasia   General Comments  pts family present and supportive;  PRVC PEEP 5, FiO2 40% with oxygen saturation > 92%     Exercises     Shoulder Instructions      Home Living Family/patient expects to be discharged to:: Private residence Living Arrangements: Spouse/significant other Available Help at Discharge: Family;Available 24 hours/day Type of Home: House Home Access: Stairs to enter [employer] of Steps: 5 Entrance Stairs-Rails: None Home Layout: One level     Bathroom Shower/Tub: Teacher, early years/pre: Standard     Home Equipment: Other (comment)(oxgyen )          Prior Functioning/Environment Level of Independence: Independent        Comments: driving, 5-9D  oxygen most of the time          OT Problem List: Decreased strength;Decreased range of motion;Decreased activity tolerance;Decreased coordination;Decreased cognition;Decreased safety awareness;Decreased knowledge of use of DME or AE;Decreased knowledge of precautions;Impaired vision/perception;Impaired balance (sitting and/or standing);Cardiopulmonary status limiting activity;Increased edema      OT Treatment/Interventions: Self-care/ADL training;Therapeutic exercise;Neuromuscular education;DME and/or AE instruction;Therapeutic activities;Cognitive remediation/compensation;Visual/perceptual remediation/compensation;Patient/family education;Balance training    OT Goals(Current goals can be found in the care plan section) Acute Rehab OT Goals Patient Stated Goal: didn't state Time For Goal Achievement: 04-25-2018 Potential to Achieve Goals: Good  OT Frequency: Min 2X/week   Barriers to D/C:            Co-evaluation PT/OT/SLP Co-Evaluation/Treatment: Yes Reason for Co-Treatment: Complexity of the patient's impairments (multi-system involvement)   OT goals addressed during session: ADL's and self-care;Strengthening/ROM;Other (comment)(mobility)      AM-PAC OT "6 Clicks" Daily Activity     Outcome Measure Help from another person eating meals?: Total Help from another person taking care of personal grooming?: A Lot Help from another person toileting, which includes using toliet, bedpan, or urinal?: Total Help from another person bathing (including washing, rinsing, drying)?: A Lot Help from another person to put on and taking off regular upper body clothing?: A Lot Help from another person to put on and taking off regular lower body clothing?: Total 6 Click Score: 9   End of Session Equipment Utilized During Treatment: Oxygen Nurse Communication: Mobility status  Activity Tolerance: Patient tolerated treatment well Patient left: in bed;with call bell/phone within reach;with bed  alarm set;with family/visitor present;with nursing/sitter in room  OT Visit Diagnosis: Other abnormalities of gait and mobility (R26.89);Muscle weakness (generalized) (M62.81);Other symptoms and signs involving cognitive function                Time: 5974-1638 OT Time Calculation (min): 25 min Charges:  OT General Charges $OT Visit: 1 Visit OT Evaluation $OT Eval High Complexity: 1 High  Delight Stare, OT Acute Rehabilitation Services Pager [phone] Office [phone]   Delight Stare 03/26/2018, 3:37 PM

## 2018-03-26 NOTE — Progress Notes (Signed)
Inpatient Rehabilitation Admissions Coordinator  Patient was screened by Cleatrice Burke for appropriateness for an Inpatient Acute Rehab Consult per PT recommendation. Noted pt remains on vent. I will follow his progress to assist with planning dispo.  Danne Baxter, RN, MSN Rehab Admissions Coordinator [phone] 03/26/2018 2:32 PM

## 2018-03-26 NOTE — Progress Notes (Signed)
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 Estanislado Pandy. Patient laying in bed intubated and sedated. RN reports reintubation yesterday due to progressively worsening respiratory status. Can spontaneously move all extremities. Right groin incision c/d/i.   Allergies: Penicillins; Iodine; and Fish oil  Medications: Prior to Admission medications   Medication Sig Start Date End Date Taking? Authorizing Provider  aspirin EC 81 MG tablet Take 81 mg by mouth daily.   Yes [provider]  atorvastatin (LIPITOR) 20 MG tablet TAKE 1 TABLET BY MOUTH ONCE DAILY Patient taking differently: Take 20 mg by mouth daily.  12/30/17  Yes Nahser, Wonda Cheng, MD  carvedilol (COREG) 25 MG tablet TAKE 1 TABLET BY MOUTH TWICE DAILY WITH MEALS Patient taking differently: Take 25 mg by mouth 2 (two) times daily with a meal.  02/12/18  Yes Nahser, Wonda Cheng, MD  ibuprofen (ADVIL,MOTRIN) 200 MG tablet Take 200-300 mg by mouth 2 (two) times daily as needed for mild pain.    Yes [provider]  Insulin Glargine (LANTUS SOLOSTAR) 100 UNIT/ML Solostar Pen INJECT 50 UNITS SUBCUTANEOUSLY ONCE DAILY Patient taking differently: Inject 50 Units into the skin daily.  10/03/17  Yes Biagio Borg, MD  Lutein 20 MG CAPS Take 20 mg by mouth daily.   Yes [provider]  metFORMIN (GLUCOPHAGE-XR) 500 MG 24 hr tablet Take 4 tablets (2,000 mg total) by mouth daily with breakfast. 11/26/17  Yes Biagio Borg, MD  methocarbamol (ROBAXIN) 500 MG tablet TAKE ONE TABLET BY MOUTH ONCE DAILY AS NEEDED Patient taking differently: Take 500 mg by mouth daily as needed for muscle spasms.  12/18/16  Yes Biagio Borg, MD  Multiple Vitamin  (MULTIVITAMIN WITH MINERALS) TABS Take 1 tablet by mouth daily.   Yes [provider]  omeprazole (PRILOSEC) 20 MG capsule TAKE 1 CAPSULE BY MOUTH TWICE DAILY Patient taking differently: Take 20 mg by mouth 2 (two) times daily before a meal.  02/11/18  Yes Biagio Borg, MD  pioglitazone (ACTOS) 30 MG tablet TAKE 1 TABLET BY MOUTH ONCE DAILY Patient taking differently: Take 30 mg by mouth daily.  12/02/17  Yes Biagio Borg, MD  budesonide (PULMICORT) 180 MCG/ACT inhaler Inhale 2 puffs into the lungs 2 (two) times daily. Patient not taking: Reported on 03/25/2018 02/18/18   Noralee Space, MD  Calcium-Magnesium-Zinc 1000-400-15 MG TABS Take 1 tablet by mouth daily.     [provider]  Cholecalciferol (VITAMIN D) 1000 UNITS capsule Take 1,000 Units by mouth daily.     [provider]  Cyanocobalamin (B-12 PO) Take 1 tablet by mouth daily.     [provider]  Insulin Pen Needle (RELION PEN NEEDLES) 32G X 4 MM MISC Use as directed once daily E11.9 10/07/17   Biagio Borg, MD  OMEGA 3 1000 MG CAPS Take 1,000 mg by mouth 2 (two) times daily.     [provider]     Vital Signs: BP (!) 162/67   Pulse 75   Temp 98.5 F (36.9 C) (Axillary)   Resp 18   Ht 5\' 7"  (1.702 m)   Wt 163 lb 5.8 oz (74.1 kg)   SpO2 96%   BMI 25.59 kg/m   Physical Exam  Constitutional: He appears well-developed and well-nourished. No distress.  Intubated and sedated.  Pulmonary/Chest: Effort normal. No respiratory distress.  Intubated and sedated.  Neurological:  Intubated and sedated. Speech and comprehension not assessed. PERRL bilaterally. EOMs not assessed. Visual fields not assessed. Facial asymmetry not assessed. Tongue midline not assessed. Can spontaneously move all extremities. Pronator drift not assessed. Fine motor and coordination not assessed. Gait not assessed. Romberg not assessed. Heel to toe not assessed. Distal pulses 1+ bilaterally.   Skin:  Skin is warm and dry.  Right groin incision soft without active bleeding or hematoma.  Psychiatric:  Intubated and sedated.  Nursing note and vitals reviewed.   Imaging: Ct Angio Head W Or Wo Contrast  Result Date: 03/23/2018 CLINICAL DATA:  Right-sided weakness, aphasia EXAM: CT ANGIOGRAPHY HEAD AND NECK TECHNIQUE: Multidetector CT imaging of the head and neck was performed using the standard protocol during bolus administration of intravenous contrast. Multiplanar CT image reconstructions and MIPs were obtained to evaluate the vascular anatomy. Carotid stenosis measurements (when applicable) are obtained utilizing NASCET criteria, using the distal internal carotid diameter as the denominator. CONTRAST:  24mL ISOVUE-370 IOPAMIDOL (ISOVUE-370) INJECTION 76% COMPARISON:  CT head 03/23/2018 FINDINGS: CTA NECK FINDINGS Aortic arch: Atherosclerotic aortic arch without aneurysm. Mild atherosclerotic disease and tortuosity in the proximal great vessels without significant stenosis. Right carotid system: Scattered atherosclerotic disease in the right common carotid artery. Atherosclerotic calcified plaque in the right carotid bifurcation. 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 Left carotid system: Scattered atherosclerotic disease left common carotid artery without significant stenosis. Atherosclerotic calcification left carotid bifurcation. There is decreased flow in the left common carotid artery due to acute occlusion of the proximal left internal carotid artery with thrombus present. There is no contrast distal to the proximal left internal carotid artery which is thrombosed. Left internal carotid artery is occluded to the supraclinoid segment. Vertebral arteries: Left vertebral artery dominant with moderate stenosis at the origin. Right vertebral non dominant without significant stenosis. Skeleton: Cervical spondylosis.  No acute skeletal abnormality. Other neck: Negative for mass or  adenopathy in the neck. Upper chest: Transvenous pacemaker left. Interstitial fibrosis of the lungs bilaterally. No pleural effusion. Review of the MIP images confirms the above findings CTA HEAD FINDINGS Anterior circulation: Left internal carotid artery is occluded through the cavernous segment with reconstitution of the supraclinoid segment via flow from the anterior communicating artery. There is clot throughout the left M1 segment with slow flow. There is a small amount of flow in left M2 branches with occlusion of the posterior division of left M2. Both anterior cerebral arteries are patent. Right middle cerebral artery patent without stenosis. Atherosclerotic plaque in the right cavernous carotid with mild stenosis. Posterior circulation: Both vertebral arteries patent to the basilar. Left vertebral dominant. Right PICA patent. Left PICA not visualized. AICA patent bilaterally. Basilar widely patent. Superior cerebellar artery patent. Right posterior cerebral artery patent. Probable fetal origin of the left posterior cerebral artery with supply from a hypoplastic left P1 segment. Venous sinuses: Patent Anatomic variants: None Delayed phase: Not performed Review of the MIP images confirms the above findings IMPRESSION: 1. 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 Reconstitution of the supraclinoid left internal carotid artery through the anterior communicating artery. There is clot in the left M1 and M2 segments with decreased blood flow. There is occlusion of the posterior division of the left middle cerebral artery due to clot. 2. 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 3.  Moderate stenosis origin of left vertebral artery 4. Probable fetal origin left posterior cerebral artery which is supplied from a small hypoplastic left P1 segment. 5. These results were called by telephone at the time of interpretation on 03/23/2018 at 6:33 pm to Dr. Leonel Ramsay , who verbally  acknowledged these results. Electronically Signed   By: Franchot Gallo M.D.   On: 03/23/2018 18:54   Ct Head Wo Contrast  Result Date: 03/24/2018 CLINICAL DATA:  77 y/o M; left ICA occlusion post intra-arterial intervention. EXAM: CT HEAD WITHOUT CONTRAST TECHNIQUE: Contiguous axial images were obtained from the base of the skull through the vertex without intravenous contrast. COMPARISON:  03/23/2018 CT head, CTA head, and cerebral angiogram. FINDINGS: Brain: Cortical hypoattenuation with loss of gray-white differentiation and mild edema of the left lateral temporal lobe, temporal operculum, insula left parietal lobe, and the lateral aspect of the left occipital lobe compatible with late acute/early subacute infarction. No acute hemorrhage. No extra-axial collection, hydrocephalus, midline shift, or herniation. No additional area of acute stroke. Stable background of chronic microvascular ischemic changes and volume loss of the brain. 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 Vascular: Calcific atherosclerosis of carotid siphons and vertebral arteries. No hyperdense vessel identified. Skull: Normal. Negative for fracture or focal lesion. Sinuses/Orbits: No acute finding. Other: None. IMPRESSION: 1. 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 No hemorrhage or significant mass effect. 2. Stable background of chronic microvascular ischemic changes and volume loss of the brain. 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 These results will be called to the ordering clinician or representative by the Radiologist Assistant, and communication documented in the PACS or zVision Dashboard. Electronically Signed   By: Kristine Garbe M.D.   On: 03/24/2018 20:38   Ct Angio Neck W Or Wo Contrast  Result Date: 03/23/2018 CLINICAL DATA:  Right-sided weakness, aphasia EXAM: CT ANGIOGRAPHY HEAD AND NECK  TECHNIQUE: Multidetector CT imaging of the head and neck was performed using the standard protocol during bolus administration of intravenous contrast. Multiplanar CT image reconstructions and MIPs were obtained to evaluate the vascular anatomy. Carotid stenosis measurements (when applicable) are obtained utilizing NASCET criteria, using the distal internal carotid diameter as the denominator. CONTRAST:  15mL ISOVUE-370 IOPAMIDOL (ISOVUE-370) INJECTION 76% COMPARISON:  CT head 03/23/2018 FINDINGS: CTA NECK FINDINGS Aortic arch: Atherosclerotic aortic arch without aneurysm. Mild atherosclerotic disease and tortuosity in the proximal great vessels without significant stenosis. Right carotid system: Scattered atherosclerotic disease in the right common carotid artery. Atherosclerotic calcified plaque in the right carotid bifurcation. 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 Left carotid system: Scattered atherosclerotic disease left common carotid artery without significant stenosis. Atherosclerotic calcification left carotid bifurcation. There is decreased flow in the left common carotid artery due to acute occlusion of the proximal left internal carotid artery with thrombus present. There is no contrast distal to the proximal left internal carotid artery which is thrombosed. Left internal carotid artery is occluded to the supraclinoid segment. Vertebral arteries: Left vertebral artery dominant with moderate stenosis at the origin. Right vertebral non dominant without significant stenosis. Skeleton: Cervical spondylosis.  No acute skeletal abnormality. Other neck: Negative for mass or adenopathy in the neck. Upper chest: Transvenous pacemaker left. Interstitial fibrosis of the lungs bilaterally. No pleural effusion. Review of the MIP images confirms the above findings CTA HEAD FINDINGS Anterior circulation: Left internal carotid artery is occluded through the cavernous segment with reconstitution  of the supraclinoid  segment via flow from the anterior communicating artery. There is clot throughout the left M1 segment with slow flow. There is a small amount of flow in left M2 branches with occlusion of the posterior division of left M2. Both anterior cerebral arteries are patent. Right middle cerebral artery patent without stenosis. Atherosclerotic plaque in the right cavernous carotid with mild stenosis. Posterior circulation: Both vertebral arteries patent to the basilar. Left vertebral dominant. Right PICA patent. Left PICA not visualized. AICA patent bilaterally. Basilar widely patent. Superior cerebellar artery patent. Right posterior cerebral artery patent. Probable fetal origin of the left posterior cerebral artery with supply from a hypoplastic left P1 segment. Venous sinuses: Patent Anatomic variants: None Delayed phase: Not performed Review of the MIP images confirms the above findings IMPRESSION: 1. 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 Reconstitution of the supraclinoid left internal carotid artery through the anterior communicating artery. There is clot in the left M1 and M2 segments with decreased blood flow. There is occlusion of the posterior division of the left middle cerebral artery due to clot. 2. 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 3. Moderate stenosis origin of left vertebral artery 4. Probable fetal origin left posterior cerebral artery which is supplied from a small hypoplastic left P1 segment. 5. These results were called by telephone at the time of interpretation on 03/23/2018 at 6:33 pm to Dr. Leonel Ramsay , who verbally acknowledged these results. Electronically Signed   By: Franchot Gallo M.D.   On: 03/23/2018 18:54   [place]  Result Date: 03/25/2018 INDICATION: Acute onset of aphasia, left gaze deviation and right-sided paresis. Occluded left middle cerebral artery, and left internal carotid artery on CT angiogram of the  head and neck. EXAM: 1. EMERGENT LARGE VESSEL OCCLUSION THROMBOLYSIS (anterior CIRCULATION) COMPARISON:  CT angiogram of the head and neck of 03/23/2018. MEDICATIONS: Ancef 2 g IV antibiotic was administered within 1 hour of the procedure. ANESTHESIA/SEDATION: General anesthesia CONTRAST:  Isovue 300 approximately 80 mL. FLUOROSCOPY TIME:  Fluoroscopy Time: 33 minutes 48 seconds (2127 mGy). COMPLICATIONS: None immediate. TECHNIQUE: Following a full explanation of the procedure along with the potential associated complications, an informed witnessed consent was obtained from the patient's spouse and daughter. The risks of intracranial hemorrhage of 10%, worsening neurological deficit, ventilator dependency, death and inability to revascularize were all reviewed in detail with the patient's spouse and daughter. The patient was then put under general anesthesia by the Department of Anesthesiology at [facility]. The right groin was prepped and draped in the usual sterile fashion. Thereafter using modified Seldinger technique, transfemoral access into the right common femoral artery was obtained without difficulty. Over a 0.035 inch guidewire a 5 French Pinnacle sheath was inserted. Through this, and also over a 0.035 inch guidewire a 5 [place] JB 1 catheter was advanced to the aortic arch region and selectively positioned in the left common carotid artery. FINDINGS: The left common carotid arteriogram demonstrates mild stenosis at the origin of the left external carotid artery. More distally, the left external carotid artery branches are seen to opacify normally. The left internal carotid artery at the bulb and just distal to the bulb opacifies with stagnation of blood flow. There slow ascent of contrast into the proximal left internal carotid artery in the cervical region. More distally, there is no reconstitution of the left internal carotid artery. No reconstitution is noted from the left external carotid  artery branches either. PROCEDURE: ENDOVASCULAR REVASCULARIZATION OF OCCLUDED LEFT  INTERNAL CAROTID ARTERY EXTRA CRANIALLY AND INTRACRANIALLY WITH 1 PASS USING THE 5 MM X 33 MM EMBOTRAP RETRIEVAL DEVICE, AND ALSO OF THE LEFT MIDDLE CEREBRAL ARTERY SUPERIOR DIVISION WITH 1 PASS USING THE 5 MM X 33 MM EMBOTRAP DEVICE ACHIEVING A TICI 3 REVASCULARIZATION. The diagnostic JB 1 catheter in the left common carotid artery was exchanged over a 0.035 inch 300 cm Rosen exchange guidewire for an 8 French 55 cm Brite tip neurovascular sheath using biplane roadmap technique and constant fluoroscopic guidance. Good aspiration was obtained from the hub of the 8 French neurovascular sheath. This was then connected to continuous heparinized saline infusion. Over the [employer] guidewire, an 8 [place] 85 cm FlowGate balloon guide catheter which had been prepped with 50% contrast and 50% heparinized saline infusion was advanced and positioned just proximal to the left common carotid bifurcation. The guidewire was removed. Good aspiration obtained from the hub of the [facility] guide catheter. A gentle contrast injection demonstrated no evidence of dissection or of intraluminal filling defects. Over a 0.014 inch Softip Synchro micro guidewire, a combination of a 115 cm 5 [place] Navien guide catheter inside of which was an 021 Trevo ProVue microcatheter was advanced to the distal end of the [facility] guide catheter. The combination was then advanced with the micro guidewire leading with a J-tip configuration without any difficulty. The combination was advanced to the supraclinoid left ICA. There, the micro guidewire was removed. Slow aspiration of blood was noted at the hub of the microcatheter. A gentle contrast injection through the microcatheter demonstrated faint opacification of the left MCA M1 region. The micro guidewire was then reinserted. With a torque device, the micro guidewire was advanced into the M1 M2 junction of the  inferior division of the left middle cerebral artery followed by the microcatheter. The guidewire was removed. Good aspiration obtained from the hub of the microcatheter. A gentle contrast injection demonstrated safe position of tip of the microcatheter. This was then connected to continuous heparinized saline infusion. A gentle control arteriogram performed through the Navien guide catheter in the proximal cavernous segment demonstrated complete angiographic nonvisualization at the level of the ophthalmic artery. A 5 mm x 33 mm Embotrap retrieval device was then advanced in a coaxial manner and with constant heparinized saline infusion to the distal end of the microcatheter. The proximal and the distal landing zones were then defined. The O ring on the delivery microcatheter was then loosened. With slight forward gentle traction with the right hand on the delivery micro guidewire, with the left hand the retrieval device was then retrieved. A gentle control arteriogram performed through the Navien 5 [place] guide catheter in the left internal carotid artery proximal cavernous segment demonstrated continued nonvisualization of the distal left internal carotid artery in the left middle cerebral artery distribution. Proximal flow arrest was then initiated by advancing the [facility] guide catheter into the proximal left internal carotid artery and inflating the balloon. With constant aspiration being applied at the hub of the [facility] guide catheter with a 60 mL syringe, and the Navien guide catheter with a Penumbra vacuum suction device, the combination of the retrieval device, the microcatheter and the 5 [place] Navien guide catheter were retrieved and removed. Aspiration was continued as the proximal flow arrest was then reversed by deflating the balloon of the [facility] guide catheter. Free aspiration of blood was noted at the hub of the [facility] guide catheter. A gentle control arteriogram performed through the  [facility] guide catheter in the proximal  left internal carotid artery demonstrated complete revascularization of the occluded left internal carotid artery proximally and distally. A wide left posterior communicating artery was seen opacifying the left posterior cerebral artery distribution. The left anterior and the left middle cerebral arteries were now widely patent. There continued to be a filling defect noted in the superior division of the left middle cerebral artery. The combination of the 5 [place] Navien guide catheter inside of which was an 021 Trevo ProVue microcatheter was again advanced using biplane roadmap technique and constant fluoroscopic guidance over a 0.014 inch Softip Synchro micro guidewire to the supraclinoid left ICA. The micro guidewire was then gently manipulated with the torque device and advanced into the occluded superior division with a filling defect in the M2 M3 region followed by the microcatheter. The guidewire was removed. There was slow aspiration of blood at the hub of the microcatheter. A gentle control arteriogram through the microcatheter demonstrated safe position of tip of the microcatheter. The 5 mm x 33 mm Embotrap retrieval device was again advanced as described above to the distal end of the microcatheter. The O ring on the delivery microcatheter was then loosened. With slight forward gentle traction with the right hand the delivery micro guidewire with the left hand the delivery microcatheter was retrieved deploying the retrieval device. A control arteriogram performed through the Navien guide catheter in the distal cavernous left ICA demonstrated a TICI 3 revascularization. With proximal flow arrest again initiating the left internal carotid artery by inflating the FlowGate balloon guide catheter, the combination of the retrieval device, and the microcatheter and the 5 [place] Navien guide catheter were then retrieved and removed as constant aspiration was applied with a  60 mL syringe at the hub of the [facility] guide catheter, and a Penumbra suction vacuum device at the hub of the Navien guide catheter. Proximal flow arrest was continued as the balloon was deflated in the left internal carotid artery. Clot was noted in the aspirate, and also in the retrieval device. Earlier the aspirate had revealed copious amounts of dark clot at the time of the first pass. A control arteriogram performed through the 8 [place] FlowGate guide catheter in the left internal carotid artery demonstrated a complete revascularization of the previously noted filling defect within the superior division M2 segment of the left middle cerebral artery. Overall a TICI 3 revascularization had been established. Moderate vasospasm at the superior division of the left middle cerebral artery proximally responded to 2 aliquots of 25 mcg nitroglycerin intra-arterially in the left internal carotid artery. Throughout the procedure, the patient's blood pressure and neurological status remained stable. No evidence of bradycardia or mass-effect or extravasation of contrast was noted. The FlowGate balloon guide catheter, and the 8 French Brite tip sheath were then removed over a 0.035 inch guidewire and replaced with an 8 [place] Pinnacle sheath. This was left in situ and connected to continuous heparinized saline infusion. The right groin appeared soft without evidence of hematoma. Distal pulses remained palpable in the dorsalis pedis, and posterior tibial regions on the right side, and Dopplerable DP and PT on the left side unchanged. Patient's CT angiogram revealed no evidence of a gross hemorrhage or mass effect or midline shift. The patient was left intubated on account of his pulmonary fibrosis. He was then transferred to the neuro ICU to continue with further post revascularization of large vessel ischemic stroke. IMPRESSION: Status post endovascular complete revascularization of occluded left internal carotid artery  extra cranially and intracranially with  1 pass with the 5 mm x 33 mm Embotrap retrieval device. Status post endovascular complete revascularization of occluded superior division of the left middle cerebral artery with 1 pass with a 5 mm x 33 mm Embotrap retrieval device achieving a TICI 3 revascularization. PLAN: Follow-up in clinic 1 month post discharge. Electronically Signed   By: Luanne Bras M.D.   On: 03/24/2018 11:13   Dg Chest Port 1 View  Result Date: 03/25/2018 CLINICAL DATA:  Initial evaluation for endotracheal tube placement EXAM: PORTABLE CHEST 1 VIEW COMPARISON:  Prior radiograph from earlier the same day. FINDINGS: Tip of endotracheal tube positioned approximately 3.5 cm above the carina. Enteric tube courses into the abdomen. Left-sided pacemaker/AICD noted. Cardiac and mediastinal silhouettes are stable, and remain within normal limits. Lungs remain hypoinflated. Chronic interstitial changes with superimposed mild bibasilar atelectasis and/or infiltrates again noted,, perhaps mildly improved. No large pleural effusion. No pneumothorax. No acute osseous abnormality. IMPRESSION: 1. Tip of the endotracheal tube 3.5 cm above the carina. 2. Shallow lung inflation with chronic interstitial changes. Superimposed bibasilar opacities could reflect atelectasis and/or infiltrates, slightly improved from prior radiograph earlier today. Electronically Signed   By: Jeannine Boga M.D.   On: 03/25/2018 19:03   Portable Chest X-ray  Result Date: 03/25/2018 CLINICAL DATA:  Intubation. EXAM: PORTABLE CHEST 1 VIEW COMPARISON:  Twelve 05/2017.  02/18/2018. FINDINGS: Endotracheal tube tip 3 cm above the carina. NG tube tip in the stomach. Heart size normal. Diffuse bilateral pulmonary interstitial prominence noted. Interstitial changes are stable and most likely represent chronic interstitial lung disease. Mild bibasilar atelectasis/infiltrates. No pleural effusion or pneumothorax. IMPRESSION: 1.  Endotracheal tube tip noted 3 cm above the carina. NG tube tip noted in stomach. 3.  AICD in stable position.  Heart size stable. 4. Stable chronic interstitial changes. Mild bibasilar atelectasis/infiltrates again noted without interim change. Electronically Signed   By: Marcello Moores  Register   On: 03/25/2018 10:13   Dg Chest Port 1 View  Result Date: 03/24/2018 CLINICAL DATA:  Hypoxia EXAM: PORTABLE CHEST 1 VIEW COMPARISON:  Chest radiograph 03/23/2018 FINDINGS: Multi lead AICD device overlies the left hemithorax. Leads are stable in position. Interval extubation and removal of enteric tube. Stable cardiomegaly. Interval increase in patchy consolidation within the left mid lower lung. Scattered coarse interstitial opacities are similar when compared to prior. IMPRESSION: Increased patchy consolidation left lower lung may represent pneumonia in the appropriate clinical setting. Followup PA and lateral chest X-ray is recommended in 3-4 weeks following trial of antibiotic therapy to ensure resolution and exclude underlying malignancy. Coarse interstitial opacities bilaterally may represent edema superimposed upon chronic interstitial process. Electronically Signed   By: Lovey Newcomer M.D.   On: 03/24/2018 21:43   Dg Chest Port 1 View  Result Date: 03/23/2018 CLINICAL DATA:  Post intubation and orogastric tube placement. EXAM: PORTABLE CHEST 1 VIEW COMPARISON:  02/18/2018 FINDINGS: Endotracheal tube has tip 2.9 cm above the carina. Placement of enteric tube with tip over the stomach in the left upper quadrant. Left-sided pacemaker unchanged. Lungs are hypoinflated demonstrate stable chronic bilateral interstitial disease. Mild prominence of the perihilar markings suggesting mild degree of vascular congestion cardiomediastinal silhouette and remainder of the exam is unchanged. IMPRESSION: Suggestion mild vascular congestion.  Chronic interstitial disease. Tubes and lines as described. Electronically Signed   By:  Marin Olp M.D.   On: 03/23/2018 21:56   Ir Percutaneous Art Thrombectomy/infusion Intracranial Inc Diag Angio  Result Date: 03/25/2018 INDICATION: Acute onset of aphasia, left gaze  deviation and right-sided paresis. Occluded left middle cerebral artery, and left internal carotid artery on CT angiogram of the head and neck. EXAM: 1. EMERGENT LARGE VESSEL OCCLUSION THROMBOLYSIS (anterior CIRCULATION) COMPARISON:  CT angiogram of the head and neck of 03/23/2018. MEDICATIONS: Ancef 2 g IV antibiotic was administered within 1 hour of the procedure. ANESTHESIA/SEDATION: General anesthesia CONTRAST:  Isovue 300 approximately 80 mL. FLUOROSCOPY TIME:  Fluoroscopy Time: 33 minutes 48 seconds (2127 mGy). COMPLICATIONS: None immediate. TECHNIQUE: Following a full explanation of the procedure along with the potential associated complications, an informed witnessed consent was obtained from the patient's spouse and daughter. The risks of intracranial hemorrhage of 10%, worsening neurological deficit, ventilator dependency, death and inability to revascularize were all reviewed in detail with the patient's spouse and daughter. The patient was then put under general anesthesia by the Department of Anesthesiology at [facility]. The right groin was prepped and draped in the usual sterile fashion. Thereafter using modified Seldinger technique, transfemoral access into the right common femoral artery was obtained without difficulty. Over a 0.035 inch guidewire a 5 French Pinnacle sheath was inserted. Through this, and also over a 0.035 inch guidewire a 5 [place] JB 1 catheter was advanced to the aortic arch region and selectively positioned in the left common carotid artery. FINDINGS: The left common carotid arteriogram demonstrates mild stenosis at the origin of the left external carotid artery. More distally, the left external carotid artery branches are seen to opacify normally. The left internal carotid artery at  the bulb and just distal to the bulb opacifies with stagnation of blood flow. There slow ascent of contrast into the proximal left internal carotid artery in the cervical region. More distally, there is no reconstitution of the left internal carotid artery. No reconstitution is noted from the left external carotid artery branches either. PROCEDURE: ENDOVASCULAR REVASCULARIZATION OF OCCLUDED LEFT INTERNAL CAROTID ARTERY EXTRA CRANIALLY AND INTRACRANIALLY WITH 1 PASS USING THE 5 MM X 33 MM EMBOTRAP RETRIEVAL DEVICE, AND ALSO OF THE LEFT MIDDLE CEREBRAL ARTERY SUPERIOR DIVISION WITH 1 PASS USING THE 5 MM X 33 MM EMBOTRAP DEVICE ACHIEVING A TICI 3 REVASCULARIZATION. The diagnostic JB 1 catheter in the left common carotid artery was exchanged over a 0.035 inch 300 cm Rosen exchange guidewire for an 8 French 55 cm Brite tip neurovascular sheath using biplane roadmap technique and constant fluoroscopic guidance. Good aspiration was obtained from the hub of the 8 French neurovascular sheath. This was then connected to continuous heparinized saline infusion. Over the [employer] guidewire, an 8 [place] 85 cm FlowGate balloon guide catheter which had been prepped with 50% contrast and 50% heparinized saline infusion was advanced and positioned just proximal to the left common carotid bifurcation. The guidewire was removed. Good aspiration obtained from the hub of the [facility] guide catheter. A gentle contrast injection demonstrated no evidence of dissection or of intraluminal filling defects. Over a 0.014 inch Softip Synchro micro guidewire, a combination of a 115 cm 5 [place] Navien guide catheter inside of which was an 021 Trevo ProVue microcatheter was advanced to the distal end of the [facility] guide catheter. The combination was then advanced with the micro guidewire leading with a J-tip configuration without any difficulty. The combination was advanced to the supraclinoid left ICA. There, the micro guidewire was  removed. Slow aspiration of blood was noted at the hub of the microcatheter. A gentle contrast injection through the microcatheter demonstrated faint opacification of the left MCA M1 region. The micro guidewire  was then reinserted. With a torque device, the micro guidewire was advanced into the M1 M2 junction of the inferior division of the left middle cerebral artery followed by the microcatheter. The guidewire was removed. Good aspiration obtained from the hub of the microcatheter. A gentle contrast injection demonstrated safe position of tip of the microcatheter. This was then connected to continuous heparinized saline infusion. A gentle control arteriogram performed through the Navien guide catheter in the proximal cavernous segment demonstrated complete angiographic nonvisualization at the level of the ophthalmic artery. A 5 mm x 33 mm Embotrap retrieval device was then advanced in a coaxial manner and with constant heparinized saline infusion to the distal end of the microcatheter. The proximal and the distal landing zones were then defined. The O ring on the delivery microcatheter was then loosened. With slight forward gentle traction with the right hand on the delivery micro guidewire, with the left hand the retrieval device was then retrieved. A gentle control arteriogram performed through the Navien 5 [place] guide catheter in the left internal carotid artery proximal cavernous segment demonstrated continued nonvisualization of the distal left internal carotid artery in the left middle cerebral artery distribution. Proximal flow arrest was then initiated by advancing the [facility] guide catheter into the proximal left internal carotid artery and inflating the balloon. With constant aspiration being applied at the hub of the [facility] guide catheter with a 60 mL syringe, and the Navien guide catheter with a Penumbra vacuum suction device, the combination of the retrieval device, the microcatheter and the 5  [place] Navien guide catheter were retrieved and removed. Aspiration was continued as the proximal flow arrest was then reversed by deflating the balloon of the [facility] guide catheter. Free aspiration of blood was noted at the hub of the [facility] guide catheter. A gentle control arteriogram performed through the [facility] guide catheter in the proximal left internal carotid artery demonstrated complete revascularization of the occluded left internal carotid artery proximally and distally. A wide left posterior communicating artery was seen opacifying the left posterior cerebral artery distribution. The left anterior and the left middle cerebral arteries were now widely patent. There continued to be a filling defect noted in the superior division of the left middle cerebral artery. The combination of the 5 [place] Navien guide catheter inside of which was an 021 Trevo ProVue microcatheter was again advanced using biplane roadmap technique and constant fluoroscopic guidance over a 0.014 inch Softip Synchro micro guidewire to the supraclinoid left ICA. The micro guidewire was then gently manipulated with the torque device and advanced into the occluded superior division with a filling defect in the M2 M3 region followed by the microcatheter. The guidewire was removed. There was slow aspiration of blood at the hub of the microcatheter. A gentle control arteriogram through the microcatheter demonstrated safe position of tip of the microcatheter. The 5 mm x 33 mm Embotrap retrieval device was again advanced as described above to the distal end of the microcatheter. The O ring on the delivery microcatheter was then loosened. With slight forward gentle traction with the right hand the delivery micro guidewire with the left hand the delivery microcatheter was retrieved deploying the retrieval device. A control arteriogram performed through the Navien guide catheter in the distal cavernous left ICA demonstrated a TICI 3  revascularization. With proximal flow arrest again initiating the left internal carotid artery by inflating the FlowGate balloon guide catheter, the combination of the retrieval device, and the microcatheter and the 5 [place] Navien guide  catheter were then retrieved and removed as constant aspiration was applied with a 60 mL syringe at the hub of the [facility] guide catheter, and a Penumbra suction vacuum device at the hub of the Navien guide catheter. Proximal flow arrest was continued as the balloon was deflated in the left internal carotid artery. Clot was noted in the aspirate, and also in the retrieval device. Earlier the aspirate had revealed copious amounts of dark clot at the time of the first pass. A control arteriogram performed through the 8 [place] FlowGate guide catheter in the left internal carotid artery demonstrated a complete revascularization of the previously noted filling defect within the superior division M2 segment of the left middle cerebral artery. Overall a TICI 3 revascularization had been established. Moderate vasospasm at the superior division of the left middle cerebral artery proximally responded to 2 aliquots of 25 mcg nitroglycerin intra-arterially in the left internal carotid artery. Throughout the procedure, the patient's blood pressure and neurological status remained stable. No evidence of bradycardia or mass-effect or extravasation of contrast was noted. The FlowGate balloon guide catheter, and the 8 French Brite tip sheath were then removed over a 0.035 inch guidewire and replaced with an 8 [place] Pinnacle sheath. This was left in situ and connected to continuous heparinized saline infusion. The right groin appeared soft without evidence of hematoma. Distal pulses remained palpable in the dorsalis pedis, and posterior tibial regions on the right side, and Dopplerable DP and PT on the left side unchanged. Patient's CT angiogram revealed no evidence of a gross hemorrhage or mass  effect or midline shift. The patient was left intubated on account of his pulmonary fibrosis. He was then transferred to the neuro ICU to continue with further post revascularization of large vessel ischemic stroke. IMPRESSION: Status post endovascular complete revascularization of occluded left internal carotid artery extra cranially and intracranially with 1 pass with the 5 mm x 33 mm Embotrap retrieval device. Status post endovascular complete revascularization of occluded superior division of the left middle cerebral artery with 1 pass with a 5 mm x 33 mm Embotrap retrieval device achieving a TICI 3 revascularization. PLAN: Follow-up in clinic 1 month post discharge. Electronically Signed   By: Luanne Bras M.D.   On: 03/24/2018 11:13   Ct Head Code Stroke Wo Contrast  Result Date: 03/23/2018 CLINICAL DATA:  Code stroke.  Right-sided weakness, aphasia EXAM: CT HEAD WITHOUT CONTRAST TECHNIQUE: Contiguous axial images were obtained from the base of the skull through the vertex without intravenous contrast. COMPARISON:  None. FINDINGS: Brain: Moderate atrophy. Patchy hypodensity in the cerebral white matter bilaterally compatible with mild chronic microvascular ischemia. 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 Negative for acute hemorrhage.  No acute infarct or mass. Vascular: Hyperdense left MCA. Hyperdense terminal left internal carotid artery. Atherosclerotic calcification cavernous carotid bilaterally. Skull: Negative Sinuses/Orbits: Paranasal sinuses clear. Bilateral cataract surgery. Other: None ASPECTS ([place] Stroke Program Early CT Score) - Ganglionic level infarction (caudate, lentiform nuclei, internal capsule, insula, M1-M3 cortex): 7 - Supraganglionic infarction (M4-M6 cortex): 3 Total score (0-10 with 10 being normal): 10 IMPRESSION: 1. Hyperdense terminal left internal carotid artery and left MCA compatible with thrombosis. No evidence of acute infarct or  hemorrhage 2. Atrophy and chronic microvascular ischemic changes in the white matter. 3. ASPECTS is 10 4. These results were called by telephone at the time of interpretation on 03/23/2018 at 6:33 pm to Dr. Leonel Ramsay, who verbally acknowledged these results. Electronically  Signed   By: Franchot Gallo M.D.   On: 03/23/2018 18:35    Labs:  CBC: Recent Labs    03/24/18 0403 03/25/18 0632 03/25/18 1648 03/26/18 0423  WBC 9.7 13.5* 13.9* 13.3*  HGB 12.4* 11.7* 11.2* 10.9*  HCT 41.0 36.7* 35.5* 35.0*  PLT 232 203 203 183    COAGS: Recent Labs    03/23/18 1831 03/25/18 0632  INR 1.16 1.07  APTT 27 31    BMP: Recent Labs    03/24/18 0403 03/25/18 0632 03/25/18 1648 03/26/18 0423  NA 138 138 140 139  K 4.2 3.9 3.6 4.0  CL 108 108 107 104  CO2 20* 22 25 22   GLUCOSE 195* 149* 113* 233*  BUN 23 16 15 17   CALCIUM 8.3* 8.6* 8.5* 8.7*  CREATININE 0.90 0.90 1.00 0.87  GFRNONAA >60 >60 >60 >60  GFRAA >60 >60 >60 >60    LIVER FUNCTION TESTS: Recent Labs    05/28/17 0951 11/26/17 0918 03/23/18 1831  BILITOT 0.5 0.5 1.2  AST 26 21 48*  ALT 27 20 28   ALKPHOS 42 46 33*  PROT 7.4 7.6 6.2*  ALBUMIN 4.2 4.1 3.1*    Assessment and Plan:  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 Estanislado Pandy. Patient's condition stable- reintubated but still moving all 4s. Right groin incision stable. Appreciate and agree with neurology and CCM management. IR to follow.   Electronically Signed: Earley Abide, PA-C 03/26/2018, 9:39 AM   I spent a total of 15 Minutes at the the patient's bedside AND on the patient's hospital floor or unit, greater than 50% of which was counseling/coordinating care for left ICA and left MCA superior division M2 segment occlusions s/p revascularization.

## 2018-03-27 ENCOUNTER — Inpatient Hospital Stay (HOSPITAL_COMMUNITY): Payer: Medicare Other

## 2018-03-27 DIAGNOSIS — I952 Hypotension due to drugs: Secondary | ICD-10-CM

## 2018-03-27 LAB — CBC
HCT: 33.1 % — ABNORMAL LOW (ref 39.0–52.0)
Hemoglobin: 10.4 g/dL — ABNORMAL LOW (ref 13.0–17.0)
MCH: 28.7 pg (ref 26.0–34.0)
MCHC: 31.4 g/dL (ref 30.0–36.0)
MCV: 91.4 fL (ref 80.0–100.0)
NRBC: 0 % (ref 0.0–0.2)
Platelets: 174 10*3/uL (ref 150–400)
RBC: 3.62 MIL/uL — ABNORMAL LOW (ref 4.22–5.81)
RDW: 14.5 % (ref 11.5–15.5)
WBC: 12.8 10*3/uL — ABNORMAL HIGH (ref 4.0–10.5)

## 2018-03-27 LAB — BASIC METABOLIC PANEL
Anion gap: 10 (ref 5–15)
BUN: 23 mg/dL (ref 8–23)
CO2: 24 mmol/L (ref 22–32)
Calcium: 8.7 mg/dL — ABNORMAL LOW (ref 8.9–10.3)
Chloride: 105 mmol/L (ref 98–111)
Creatinine, Ser: 0.66 mg/dL (ref 0.61–1.24)
GFR calc Af Amer: >60 mL/min (ref >60)
GFR calc non Af Amer: >60 mL/min (ref >60)
Glucose, Bld: 236 mg/dL — ABNORMAL HIGH (ref 70–99)
Potassium: 3.5 mmol/L (ref 3.5–5.1)
Sodium: 139 mmol/L (ref 135–145)

## 2018-03-27 LAB — GLUCOSE, CAPILLARY
GLUCOSE-CAPILLARY: 238 mg/dL — AB (ref 70–99)
Glucose-Capillary: 199 mg/dL — ABNORMAL HIGH (ref 70–99)
Glucose-Capillary: 223 mg/dL — ABNORMAL HIGH (ref 70–99)
Glucose-Capillary: 202 mg/dL — ABNORMAL HIGH (ref 70–99)
Glucose-Capillary: 231 mg/dL — ABNORMAL HIGH (ref 70–99)
Glucose-Capillary: 203 mg/dL — ABNORMAL HIGH (ref 70–99)

## 2018-03-27 LAB — MAGNESIUM
MAGNESIUM: 2.2 mg/dL (ref 1.7–2.4)
Magnesium: 2.1 mg/dL (ref 1.7–2.4)

## 2018-03-27 LAB — PHOSPHORUS
Phosphorus: 1.7 mg/dL — ABNORMAL LOW (ref 2.5–4.6)
Phosphorus: 2.5 mg/dL (ref 2.5–4.6)

## 2018-03-27 MED ORDER — INSULIN ASPART 100 UNIT/ML ~~LOC~~ SOLN
3.0000 [IU] | SUBCUTANEOUS | Status: DC
  Administered 2018-03-27 (×2): 9 [IU] via SUBCUTANEOUS
  Administered 2018-03-27: 6 [IU] via SUBCUTANEOUS

## 2018-03-27 MED ORDER — INSULIN GLARGINE 100 UNIT/ML ~~LOC~~ SOLN
15.0000 [IU] | Freq: Two times a day (BID) | SUBCUTANEOUS | Status: DC
  Administered 2018-03-27 – 2018-03-28 (×2): 15 [IU] via SUBCUTANEOUS
  Filled 2018-03-27 (×5): qty 0.15

## 2018-03-27 MED ORDER — INSULIN ASPART 100 UNIT/ML ~~LOC~~ SOLN
3.0000 [IU] | SUBCUTANEOUS | Status: DC
  Administered 2018-03-27 – 2018-03-28 (×3): 9 [IU] via SUBCUTANEOUS
  Administered 2018-03-28: 6 [IU] via SUBCUTANEOUS
  Administered 2018-03-28 (×2): 9 [IU] via SUBCUTANEOUS
  Administered 2018-03-29: 3 [IU] via SUBCUTANEOUS
  Administered 2018-03-30: 1 [IU] via SUBCUTANEOUS

## 2018-03-27 MED ORDER — SODIUM PHOSPHATES 45 MMOLE/15ML IV SOLN
20.0000 mmol | Freq: Once | INTRAVENOUS | Status: AC
  Administered 2018-03-27: 20 mmol via INTRAVENOUS
  Filled 2018-03-27: qty 6.67

## 2018-03-27 NOTE — Care Management Note (Signed)
Case Management Note  Patient Details  Name: [name] MRN: [id number] Date of Birth: 10/26/1940  Subjective/Objective:  Terry Peck is an 77 y.o. male HTN, DM, pulmonary fibrosis, HLD, CHF who presented to [facility] ED as a code stroke with c/o of right facial droop, right sided weakness and aphasia. 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  PTA, pt independent, lives at home with spouse.  Pt is oxygen dependent PTA; on 2-3L/[place].                   Action/Plan: Pt currently remains intubated.  PT/OT recommending CIR consult; will request rehab consult once extubated.   Expected Discharge Date:                  Expected Discharge Plan:  [place]  In-House Referral:     Discharge planning Services  CM Consult  Post Acute Care Choice:    Choice offered to:     DME Arranged:    DME Agency:     HH Arranged:    [place] Agency:     Status of Service:  In process, will continue to follow  If discussed at Long Length of Stay Meetings, dates discussed:    Additional Comments:  Reinaldo Raddle, RN, BSN  Trauma/Neuro ICU Case Manager [phone]

## 2018-03-27 NOTE — Progress Notes (Signed)
STROKE TEAM PROGRESS NOTE   SUBJECTIVE (INTERVAL HISTORY) His RN is at the bedside. Pt was on weaning trials for 3.5 hours and then became desat, put back on vent. Not able to extubate today. Back on levophed as propofol lowers his BP.   OBJECTIVE Temp:  [97.9 F (36.6 C)-98.8 F (37.1 C)] 98.1 F (36.7 C) (12/05 2000) Pulse Rate:  [59-87] 60 (12/05 2003) Cardiac Rhythm: Ventricular paced (12/05 2000) Resp:  [17-29] 20 (12/05 2003) BP: (82-157)/(46-85) 117/66 (12/05 2000) SpO2:  [88 %-100 %] 98 % (12/05 2003) FiO2 (%):  [40 %-50 %] 40 % (12/05 2003) Weight:  [74 kg] 74 kg (12/05 0500)  Recent Labs  Lab 03/27/18 0336 03/27/18 0802 03/27/18 1140 03/27/18 1549 03/27/18 1947  GLUCAP 199* 223* 202* 231* 238*   Recent Labs  Lab 03/24/18 0403 03/25/18 0632 03/25/18 1648 03/26/18 0423 03/26/18 1320 03/26/18 1639 03/27/18 0308 03/27/18 1659  NA 138 138 140 139  --   --  139  --   K 4.2 3.9 3.6 4.0  --   --  3.5  --   CL 108 108 107 104  --   --  105  --   CO2 20* 22 25 22   --   --  24  --   GLUCOSE 195* 149* 113* 233*  --   --  236*  --   BUN 23 16 15 17   --   --  23  --   CREATININE 0.90 0.90 1.00 0.87  --   --  0.66  --   CALCIUM 8.3* 8.6* 8.5* 8.7*  --   --  8.7*  --   MG 2.0  --   --   --  1.9 2.0 2.1 2.2  PHOS 3.4  --   --   --  2.2* 2.1* 1.7* 2.5   Recent Labs  Lab 03/23/18 1831  AST 48*  ALT 28  ALKPHOS 33*  BILITOT 1.2  PROT 6.2*  ALBUMIN 3.1*   Recent Labs  Lab 03/23/18 1831  03/24/18 0403 03/25/18 0632 03/25/18 1648 03/26/18 0423 03/27/18 0308  WBC 7.8  --  9.7 13.5* 13.9* 13.3* 12.8*  NEUTROABS 4.8  --  8.9*  --   --   --   --   HGB 14.3   < > 12.4* 11.7* 11.2* 10.9* 10.4*  HCT 45.2   < > 41.0 36.7* 35.5* 35.0* 33.1*  MCV 94.0  --  94.9 92.2 93.2 92.6 91.4  PLT 208  --  232 203 203 183 174   < > = values in this interval not displayed.   No results for input(s): CKTOTAL, CKMB, CKMBINDEX, TROPONINI in the last 168 hours. Recent Labs     03/25/18 0632  LABPROT 13.8  INR 1.07   No results for input(s): COLORURINE, LABSPEC, PHURINE, GLUCOSEU, HGBUR, BILIRUBINUR, KETONESUR, PROTEINUR, UROBILINOGEN, NITRITE, LEUKOCYTESUR in the last 72 hours.  Invalid input(s): APPERANCEUR     Component Value Date/Time   CHOL 95 03/24/2018 0403   CHOL 104 07/17/2016 0814   TRIG 179 (H) 03/25/2018 0632   HDL 28 (L) 03/24/2018 0403   HDL 42 07/17/2016 0814   CHOLHDL 3.4 03/24/2018 0403   VLDL 20 03/24/2018 0403   LDLCALC 47 03/24/2018 0403   LDLCALC 42 07/17/2016 0814   Lab Results  Component Value Date   HGBA1C 7.0 (H) 03/24/2018   No results found for: LABOPIA, COCAINSCRNUR, LABBENZ, AMPHETMU, THCU, LABBARB  No results for input(s): ETH in  the last 168 hours.  I have personally reviewed the radiological images below and agree with the radiology interpretations.  Ct Angio Head W Or Wo Contrast  Result Date: 03/23/2018 CLINICAL DATA:  Right-sided weakness, aphasia EXAM: CT ANGIOGRAPHY HEAD AND NECK TECHNIQUE: Multidetector CT imaging of the head and neck was performed using the standard protocol during bolus administration of intravenous contrast. Multiplanar CT image reconstructions and MIPs were obtained to evaluate the vascular anatomy. Carotid stenosis measurements (when applicable) are obtained utilizing NASCET criteria, using the distal internal carotid diameter as the denominator. CONTRAST:  13mL ISOVUE-370 IOPAMIDOL (ISOVUE-370) INJECTION 76% COMPARISON:  CT head 03/23/2018 FINDINGS: CTA NECK FINDINGS Aortic arch: Atherosclerotic aortic arch without aneurysm. Mild atherosclerotic disease and tortuosity in the proximal great vessels without significant stenosis. Right carotid system: Scattered atherosclerotic disease in the right common carotid artery. Atherosclerotic calcified plaque in the right carotid bifurcation. 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 Left carotid system: Scattered atherosclerotic disease  left common carotid artery without significant stenosis. Atherosclerotic calcification left carotid bifurcation. There is decreased flow in the left common carotid artery due to acute occlusion of the proximal left internal carotid artery with thrombus present. There is no contrast distal to the proximal left internal carotid artery which is thrombosed. Left internal carotid artery is occluded to the supraclinoid segment. Vertebral arteries: Left vertebral artery dominant with moderate stenosis at the origin. Right vertebral non dominant without significant stenosis. Skeleton: Cervical spondylosis.  No acute skeletal abnormality. Other neck: Negative for mass or adenopathy in the neck. Upper chest: Transvenous pacemaker left. Interstitial fibrosis of the lungs bilaterally. No pleural effusion. Review of the MIP images confirms the above findings CTA HEAD FINDINGS Anterior circulation: Left internal carotid artery is occluded through the cavernous segment with reconstitution of the supraclinoid segment via flow from the anterior communicating artery. There is clot throughout the left M1 segment with slow flow. There is a small amount of flow in left M2 branches with occlusion of the posterior division of left M2. Both anterior cerebral arteries are patent. Right middle cerebral artery patent without stenosis. Atherosclerotic plaque in the right cavernous carotid with mild stenosis. Posterior circulation: Both vertebral arteries patent to the basilar. Left vertebral dominant. Right PICA patent. Left PICA not visualized. AICA patent bilaterally. Basilar widely patent. Superior cerebellar artery patent. Right posterior cerebral artery patent. Probable fetal origin of the left posterior cerebral artery with supply from a hypoplastic left P1 segment. Venous sinuses: Patent Anatomic variants: None Delayed phase: Not performed Review of the MIP images confirms the above findings IMPRESSION: 1. Acute occlusion left internal  carotid artery at the origin. Reconstitution of the supraclinoid left internal carotid artery through the anterior communicating artery. There is clot in the left M1 and M2 segments with decreased blood flow. There is occlusion of the posterior division of the left middle cerebral artery due to clot. 2. 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 3. Moderate stenosis origin of left vertebral artery 4. Probable fetal origin left posterior cerebral artery which is supplied from a small hypoplastic left P1 segment. 5. These results were called by telephone at the time of interpretation on 03/23/2018 at 6:33 pm to Dr. Leonel Ramsay , who verbally acknowledged these results. Electronically Signed   By: Franchot Gallo M.D.   On: 03/23/2018 18:54   Ct Angio Neck W Or Wo Contrast  Result Date: 03/23/2018 CLINICAL DATA:  Right-sided weakness, aphasia EXAM: CT ANGIOGRAPHY HEAD AND NECK TECHNIQUE: Multidetector CT imaging  of the head and neck was performed using the standard protocol during bolus administration of intravenous contrast. Multiplanar CT image reconstructions and MIPs were obtained to evaluate the vascular anatomy. Carotid stenosis measurements (when applicable) are obtained utilizing NASCET criteria, using the distal internal carotid diameter as the denominator. CONTRAST:  34mL ISOVUE-370 IOPAMIDOL (ISOVUE-370) INJECTION 76% COMPARISON:  CT head 03/23/2018 FINDINGS: CTA NECK FINDINGS Aortic arch: Atherosclerotic aortic arch without aneurysm. Mild atherosclerotic disease and tortuosity in the proximal great vessels without significant stenosis. Right carotid system: Scattered atherosclerotic disease in the right common carotid artery. Atherosclerotic calcified plaque in the right carotid bifurcation. 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 Left carotid system: Scattered atherosclerotic disease left common carotid artery without significant stenosis. Atherosclerotic  calcification left carotid bifurcation. There is decreased flow in the left common carotid artery due to acute occlusion of the proximal left internal carotid artery with thrombus present. There is no contrast distal to the proximal left internal carotid artery which is thrombosed. Left internal carotid artery is occluded to the supraclinoid segment. Vertebral arteries: Left vertebral artery dominant with moderate stenosis at the origin. Right vertebral non dominant without significant stenosis. Skeleton: Cervical spondylosis.  No acute skeletal abnormality. Other neck: Negative for mass or adenopathy in the neck. Upper chest: Transvenous pacemaker left. Interstitial fibrosis of the lungs bilaterally. No pleural effusion. Review of the MIP images confirms the above findings CTA HEAD FINDINGS Anterior circulation: Left internal carotid artery is occluded through the cavernous segment with reconstitution of the supraclinoid segment via flow from the anterior communicating artery. There is clot throughout the left M1 segment with slow flow. There is a small amount of flow in left M2 branches with occlusion of the posterior division of left M2. Both anterior cerebral arteries are patent. Right middle cerebral artery patent without stenosis. Atherosclerotic plaque in the right cavernous carotid with mild stenosis. Posterior circulation: Both vertebral arteries patent to the basilar. Left vertebral dominant. Right PICA patent. Left PICA not visualized. AICA patent bilaterally. Basilar widely patent. Superior cerebellar artery patent. Right posterior cerebral artery patent. Probable fetal origin of the left posterior cerebral artery with supply from a hypoplastic left P1 segment. Venous sinuses: Patent Anatomic variants: None Delayed phase: Not performed Review of the MIP images confirms the above findings IMPRESSION: 1. 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 Reconstitution of the supraclinoid left  internal carotid artery through the anterior communicating artery. There is clot in the left M1 and M2 segments with decreased blood flow. There is occlusion of the posterior division of the left middle cerebral artery due to clot. 2. 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 3. Moderate stenosis origin of left vertebral artery 4. Probable fetal origin left posterior cerebral artery which is supplied from a small hypoplastic left P1 segment. 5. These results were called by telephone at the time of interpretation on 03/23/2018 at 6:33 pm to Dr. Leonel Ramsay , who verbally acknowledged these results. Electronically Signed   By: Franchot Gallo M.D.   On: 03/23/2018 18:54   Dg Chest Port 1 View  Result Date: 03/23/2018 CLINICAL DATA:  Post intubation and orogastric tube placement. EXAM: PORTABLE CHEST 1 VIEW COMPARISON:  02/18/2018 FINDINGS: Endotracheal tube has tip 2.9 cm above the carina. Placement of enteric tube with tip over the stomach in the left upper quadrant. Left-sided pacemaker unchanged. Lungs are hypoinflated demonstrate stable chronic bilateral interstitial disease. Mild prominence of the perihilar markings suggesting mild degree of vascular congestion  cardiomediastinal silhouette and remainder of the exam is unchanged. IMPRESSION: Suggestion mild vascular congestion.  Chronic interstitial disease. Tubes and lines as described. Electronically Signed   By: Marin Olp M.D.   On: 03/23/2018 21:56   Ct Head Code Stroke Wo Contrast  Result Date: 03/23/2018 CLINICAL DATA:  Code stroke.  Right-sided weakness, aphasia EXAM: CT HEAD WITHOUT CONTRAST TECHNIQUE: Contiguous axial images were obtained from the base of the skull through the vertex without intravenous contrast. COMPARISON:  None. FINDINGS: Brain: Moderate atrophy. Patchy hypodensity in the cerebral white matter bilaterally compatible with mild chronic microvascular ischemia. 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 Negative for acute hemorrhage.  No acute infarct or mass. Vascular: Hyperdense left MCA. Hyperdense terminal left internal carotid artery. Atherosclerotic calcification cavernous carotid bilaterally. Skull: Negative Sinuses/Orbits: Paranasal sinuses clear. Bilateral cataract surgery. Other: None ASPECTS ([place] Stroke Program Early CT Score) - Ganglionic level infarction (caudate, lentiform nuclei, internal capsule, insula, M1-M3 cortex): 7 - Supraganglionic infarction (M4-M6 cortex): 3 Total score (0-10 with 10 being normal): 10 IMPRESSION: 1. Hyperdense terminal left internal carotid artery and left MCA compatible with thrombosis. No evidence of acute infarct or hemorrhage 2. Atrophy and chronic microvascular ischemic changes in the white matter. 3. ASPECTS is 10 4. These results were called by telephone at the time of interpretation on 03/23/2018 at 6:33 pm to Dr. Leonel Ramsay, who verbally acknowledged these results. Electronically Signed   By: Franchot Gallo M.D.   On: 03/23/2018 18:35   TTE - Full study not performed as pt requested study to be stopped   prior to completion; only partial parasternal and no   apical/subcostal images obtained; vigorous LV systolic function;   doppler incomplete.  Ct Head Wo Contrast Result Date: 03/24/2018 CLINICAL DATA:  77 y/o M; left ICA occlusion post intra-arterial intervention. EXAM: CT HEAD WITHOUT CONTRAST TECHNIQUE: Contiguous axial images were obtained from the base of the skull through the vertex without intravenous contrast. COMPARISON:  03/23/2018 CT head, CTA head, and cerebral angiogram. FINDINGS: Brain: Cortical hypoattenuation with loss of gray-white differentiation and mild edema of the left lateral temporal lobe, temporal operculum, insula left parietal lobe, and the lateral aspect of the left occipital lobe compatible with late acute/early subacute infarction. No acute hemorrhage. No extra-axial collection, hydrocephalus,  midline shift, or herniation. No additional area of acute stroke. Stable background of chronic microvascular ischemic changes and volume loss of the brain. 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 Vascular: Calcific atherosclerosis of carotid siphons and vertebral arteries. No hyperdense vessel identified. Skull: Normal. Negative for fracture or focal lesion. Sinuses/Orbits: No acute finding. Other: None. IMPRESSION: 1. 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 No hemorrhage or significant mass effect. 2. Stable background of chronic microvascular ischemic changes and volume loss of the brain. 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 These results will be called to the ordering clinician or representative by the Radiologist Assistant, and communication documented in the PACS or zVision Dashboard. Electronically Signed   By: Kristine Garbe M.D.   On: 03/24/2018 20:38    PHYSICAL EXAM  Temp:  [97.9 F (36.6 C)-98.8 F (37.1 C)] 98.1 F (36.7 C) (12/05 2000) Pulse Rate:  [59-87] 60 (12/05 2003) Resp:  [17-29] 20 (12/05 2003) BP: (82-157)/(46-85) 117/66 (12/05 2000) SpO2:  [88 %-100 %] 98 % (12/05 2003) FiO2 (%):  [40 %-50 %] 40 % (12/05 2003) Weight:  [  74 kg] 74 kg (12/05 0500)  General - Well nourished, well developed, intubated and on low dose sedation and pressor.  Ophthalmologic - fundi not visualized due to noncooperation.  Cardiovascular - Regular rate and rhythm.  Neuro - intubated on low dose sedation and pressor, eyes open but not following specific commands. Eyes move to both directions, no gaze preference. Pupil 10mm bilaterally, reactive to light, not consistently blinking to visual threat bilaterally but attending objects to both sides.  Bilateral corneal and cough reflex present.  Facial symmetry not able to test due to ET tube, did not following commands of  tongue protrusion. Moving all extremities symmetrically, BUE 3/5 and BLE proximal 2/5 and distal 3/5. DTR 1+ and no babinski bilaterally. Sensation, coordination and gait not tested.  No significant change of neuro exam today.  ASSESSMENT/PLAN Terry Peck is a 77 y.o. male with history of CHF on pacer, pulmonary fibrosis, HTN, HLD admitted for right side weakness and aphasia. TPA given. 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  Ongoing aggressive stroke risk factor management  Therapy recommendations:  pending  Disposition:  Pending  Respiratory failure with pulmonary fibrosis  Following with pulmonary for PF as outpt  Intubated - extubated- reintubated - self extubated - re-intubated  CCM on board  Has tolerating weaning for 3.5 hours and then back on vent  May try extubate tomorrow  On Levaquin for aspiration prevention  CHF with pacer  Followed Dr. Rayann Heman  EF 60-65% in 08/2016  TTE this time EF 65-70%  Pacer interrogation no afib  Diabetes  HgbA1c 7.0 goal < 7.0  Controlled  Hyperglycemia with TF  Home meds - lantus 50 U daily  Will add lantus 15U bid  CBG monitoring  SSI  DM coordinator on board, appreciate recs  Hypotension Hx of hypertension . Stable on the low end . Back on levophed due to propofol use . On Coreg 12.5 -  continue  Long term BP goal normotensive  Hyperlipidemia  Home meds:  lipitor 20   LDL 47, goal < 70  Now on lipitor 20  Continue statin at discharge  Gross hematuria, resolved  Likely due to uretal trauma  Difficulty foley insertion - urology put in catheter  Hematuria resolved this am  On ASA 325mg   Continue monitoring  Other Stroke Risk Factors  Advanced age  Other Active Problems  Hyperkalemia - K+ 5.8->4.2-> 3.9->4.0->3.5  Leukocytosis WBC 9.7->13.5->13.3->12.8  Hospital day # 4  This patient is critically ill due to acute stroke, s/p tPA and intervention, pulmonary fibrosis, respiratory failure and at significant risk of neurological worsening, death form recurrent stroke, hemorrhagic conversion, heart failure, seizure. This patient's care requires constant monitoring of vital signs, hemodynamics, respiratory and cardiac monitoring, review of multiple databases, neurological assessment, discussion with family, other specialists and  medical decision making of high complexity. I spent 35 minutes of neurocritical care time in the care of this patient. I discussed with Dr. Loanne Drilling on the phone    Terry Hawking, MD PhD Stroke Neurology 03/27/2018 8:47 PM    To contact Stroke Continuity provider, please refer to [email or web address] After hours, contact General Neurology

## 2018-03-27 NOTE — Progress Notes (Signed)
NAME:  Terry Peck, MRN:  [id number], DOB:  [birth year], LOS: 4 ADMISSION DATE:  03/23/2018, CONSULTATION DATE:  03/23/2018 REFERRING MD:  Dr. 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 S/p tPA and reperfusion. PCCM consulted for ventilation management.  Past Medical History  Pulmonary fibrosis, HTN, DM, chronic diastolic heart failure   Significant Hospital Events   12/1 > Admitted for weakness and aphasia. Found with left ICA and Left M2 occlusion s/p tPA and revascularization 12/2 > Extubated without complication 74/0 > Worsening respiratory status requiring re-intubation. Patient self-extubated and required emergent re-intubation Consults:  Neurology 12/1 PCCM 12/1  Urology 12/3  Procedures:  --ETT 12/1 >> 12/2 --Reintubated 12/3>>Self-extubated on same day>>Reintubated for hypoxemia   Significant Diagnostic Tests:  CT Head 12/1 > 1. Hyperdense terminal left internal carotid artery and left MCA compatible with thrombosis. No evidence of acute infarct or Hemorrhage Atrophy and chronic microvascular ischemic changes in the white matter. CTA Head/Neck 12/1 > 1. 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 Reconstitution of the supraclinoid left internal carotid artery through the anterior communicating artery. There is clot in the left M1 and M2 segments with decreased blood flow. There is occlusion of the posterior division of the left middle cerebral artery due to Clot. 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 Moderate stenosis origin of left vertebral artery Probable fetal origin left posterior cerebral artery which is supplied from a small hypoplastic left P1 segment.  Micro Data:  U/A 12/1 >> Tracheal Asp 12/1 >>  Antimicrobials:  N/A    Interim  history/subjective:  Extubated on 81/4 without complication to nasal cannula. Respiratory status declined yesterday and patient required re-intubation. Unfortunately, patient self-extubated and had severe hypoxemia requiring emergent re-intubation.   No event overnight. Denies discomfort or pain. Follows commands  Objective   Blood pressure (!) 121/57, pulse 76, temperature 98.6 F (37 C), temperature source Axillary, resp. rate (!) 29, height 5\' 7"  (1.702 m), weight 74 kg, SpO2 96 %.    Vent Mode: CPAP;PSV FiO2 (%):  [40 %-50 %] 50 % Set Rate:  [15 bmp] 15 bmp Vt Set:  [520 mL] 520 mL PEEP:  [5 cmH20] 5 cmH20 Pressure Support:  [10 cmH20] 10 cmH20 Plateau Pressure:  [12 cmH20-17 cmH20] 12 cmH20   Intake/Output Summary (Last 24 hours) at 03/27/2018 0755 Last data filed at 03/27/2018 0700 Gross per 24 hour  Intake 2232.21 ml  Output 1140 ml  Net 1092.21 ml   Filed Weights   03/25/18 0500 03/26/18 0500 03/27/18 0500  Weight: 74.8 kg 74.1 kg 74 kg    Examination: General: Chronically-ill appearing male, laying in bed, on ventilator  HENT: Vivian, AT, ETT in place Eyes: EOMI, no scleral icterus Lungs: Faint bibasilar crackles, no wheezing or rhonchi CV: RRR, no m/g/r Abdomen: BS+, soft, NTTP Extremities: No edema, no cyanosis Neuro: Awake and alert.  Moves extremities x 4  GU: Foley in place  Resolved Hospital Problem list   Hyperkalemia  Assessment & Plan:   #Acute on chronic hypoxemic respiratory failure #Pulmonary Fibrosis Exacerbation #Suspected pulmonary edema #Hx of Aspiration  Dr. Lenna Gilford is his pulmonologist. On 2 L via [employer] at home. Recent TTE suggestive of PH. 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 Plan  - Continue mechanical ventilation.  Wean as tolerated with O2  saturation goal greater than 88% - SBT today - Continue nebulizers: Pulmicort and Brovana BID, PRN albuterol - Continue Solu-Medrol l x 3d with plan for steroid taper - Continue levofloxacin for coverage of  community-acquired and atypical pneumonia - Continue daily PPI - Wean pressors - Diurese when off pressors  #Hypotension Requiring minimal pressor support. Likely secondary to sedation.  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 BP goal <140 - Continue ASA - PT/OT  #Hematuria - improving Recent trauma in setting of recent tPA administration - Urology consulted. No intervention indicated.   #HTN #S/P Bi-V pacer/ICD, Followed by [facility]  #H/O G1 Diastolic HF, HLD Plan  - Telemetry - BP goal <140 post procedure per Neurology - Continue home Lipitor  - Continue Coreg - Hold home Lasix, Cozaar  DM Plan  -Trend Glucose -SSI   Best practice:  Diet: NPO Pain/Anxiety/Delirium protocol VAP protocol DVT prophylaxis: SCD GI prophylaxis: PPI Glucose control: SSI Mobility: PT/OT Code Status: FC Family Communication: Family involved. Not at bedside this am Disposition: Continue ICU  Labs   CBC: Recent Labs  Lab 03/23/18 1831  03/24/18 0403 03/25/18 0632 03/25/18 1648 03/26/18 0423 03/27/18 0308  WBC 7.8  --  9.7 13.5* 13.9* 13.3* 12.8*  NEUTROABS 4.8  --  8.9*  --   --   --   --   HGB 14.3   < > 12.4* 11.7* 11.2* 10.9* 10.4*  HCT 45.2   < > 41.0 36.7* 35.5* 35.0* 33.1*  MCV 94.0  --  94.9 92.2 93.2 92.6 91.4  PLT 208  --  232 203 203 183 174   < > = values in this interval not displayed.    Basic Metabolic Panel: Recent Labs  Lab 03/23/18 2231 03/24/18 0403 03/25/18 0632 03/25/18 1648 03/26/18 0423 03/26/18 1320 03/26/18 1639 03/27/18 0308  NA 134* 138 138 140 139  --   --  139  K 3.9 4.2 3.9 3.6 4.0  --   --  3.5  CL 107 108 108 107 104  --   --  105  CO2 20* 20* 22 25 22   --   --  24  GLUCOSE 159* 195* 149* 113* 233*  --   --  236*  BUN 22 23 16 15 17   --   --   23  CREATININE 0.91 0.90 0.90 1.00 0.87  --   --  0.66  CALCIUM 7.6* 8.3* 8.6* 8.5* 8.7*  --   --  8.7*  MG 1.9 2.0  --   --   --  1.9 2.0 2.1  PHOS  --  3.4  --   --   --  2.2* 2.1* 1.7*   GFR: Estimated Creatinine Clearance: 72.3 mL/min (by C-G formula based on SCr of 0.66 mg/dL). Recent Labs  Lab 03/23/18 2230  03/25/18 0632 03/25/18 1648 03/26/18 0423 03/27/18 0308  WBC  --    < > 13.5* 13.9* 13.3* 12.8*  LATICACIDVEN 1.0  --   --   --   --   --    < > = values in this interval not displayed.    ABG    Component Value Date/Time   PHART 7.403 03/26/2018 0407   PCO2ART 38.3 03/26/2018 0407   PO2ART 73.0 (L) 03/26/2018 0407   HCO3  23.9 03/26/2018 0407   TCO2 25 03/26/2018 0407   ACIDBASEDEF 1.0 03/26/2018 0407   O2SAT 95.0 03/26/2018 0407    CBG: Recent Labs  Lab 03/26/18 1138 03/26/18 1554 03/26/18 2009 03/26/18 2335 03/27/18 0336  GLUCAP 158* 167* 237* 235* 199*   I reviewed the following imaging myself: CT Head 12/2 - Left MCA infarction CXR 03/24/18 - Chronic interstitial changes, left lower lobe opacity that may represent pneumonia however will need clinical correlation CXR 12/3 - ETT appropriately in place, chronic bilateral interstitial changes TTE 12/3 EF 65-70%. PA peak pressure:54 mmHg  Critical care time: 35 minutes     The patient is critically ill with multiple organ systems failure and requires high complexity decision making for assessment and support, frequent evaluation and titration of therapies, application of advanced monitoring technologies and extensive interpretation of multiple databases.   Critical Care Time devoted to patient care services described in this note is  35 Minutes. This time reflects time of care of this signee Dr. Rodman Pickle. This critical care time does not reflect procedure time, or teaching time or supervisory time of PA/NP/Med student/Med Resident etc but could involve care discussion time.  Rodman Pickle,  M.D. [facility] Pulmonary/Critical Care Medicine. Pager: [phone]. After hours pager: [phone].

## 2018-03-27 NOTE — Progress Notes (Signed)
Referring Physician(s): CODE STROKE- Greta Doom  Supervising Physician: Luanne Bras  Patient Status:  [facility] - In-pt  Chief Complaint:  Stroke - S/P 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 Estanislado Pandy.  Subjective:  Patient remains intubated but trying to wean from vent now. Agitated and trying to self extubate. Nurse in room.  Allergies: Penicillins; Iodine; and Fish oil  Medications: Prior to Admission medications   Medication Sig Start Date End Date Taking? Authorizing Provider  aspirin EC 81 MG tablet Take 81 mg by mouth daily.   Yes [provider]  atorvastatin (LIPITOR) 20 MG tablet TAKE 1 TABLET BY MOUTH ONCE DAILY Patient taking differently: Take 20 mg by mouth daily.  12/30/17  Yes Nahser, Wonda Cheng, MD  carvedilol (COREG) 25 MG tablet TAKE 1 TABLET BY MOUTH TWICE DAILY WITH MEALS Patient taking differently: Take 25 mg by mouth 2 (two) times daily with a meal.  02/12/18  Yes Nahser, Wonda Cheng, MD  ibuprofen (ADVIL,MOTRIN) 200 MG tablet Take 200-300 mg by mouth 2 (two) times daily as needed for mild pain.    Yes [provider]  Insulin Glargine (LANTUS SOLOSTAR) 100 UNIT/ML Solostar Pen INJECT 50 UNITS SUBCUTANEOUSLY ONCE DAILY Patient taking differently: Inject 50 Units into the skin daily.  10/03/17  Yes Biagio Borg, MD  Lutein 20 MG CAPS Take 20 mg by mouth daily.   Yes [provider]  metFORMIN (GLUCOPHAGE-XR) 500 MG 24 hr tablet Take 4 tablets (2,000 mg total) by mouth daily with breakfast. 11/26/17  Yes Biagio Borg, MD  methocarbamol (ROBAXIN) 500 MG tablet TAKE ONE TABLET BY MOUTH ONCE DAILY AS NEEDED Patient taking differently: Take 500 mg by mouth daily as needed for muscle spasms.  12/18/16  Yes Biagio Borg, MD  Multiple Vitamin (MULTIVITAMIN WITH MINERALS) TABS Take 1 tablet by mouth daily.   Yes [provider]  omeprazole (PRILOSEC) 20 MG capsule TAKE 1 CAPSULE BY MOUTH TWICE DAILY Patient taking differently: Take 20 mg by mouth 2 (two) times daily before a meal.  02/11/18  Yes Biagio Borg, MD  pioglitazone (ACTOS) 30 MG tablet TAKE 1 TABLET BY MOUTH ONCE DAILY Patient taking differently: Take 30 mg by mouth daily.  12/02/17  Yes Biagio Borg, MD  budesonide (PULMICORT) 180 MCG/ACT inhaler Inhale 2 puffs into the lungs 2 (two) times daily. Patient not taking: Reported on 03/25/2018 02/18/18   Noralee Space, MD  Calcium-Magnesium-Zinc 1000-400-15 MG TABS Take 1 tablet by mouth daily.     [provider]  Cholecalciferol (VITAMIN D) 1000 UNITS capsule Take 1,000 Units by mouth daily.     [provider]  Cyanocobalamin (B-12 PO) Take 1 tablet by mouth daily.     [provider]  Insulin Pen Needle (RELION PEN NEEDLES) 32G X 4 MM MISC Use as directed once daily E11.9 10/07/17   Biagio Borg, MD  OMEGA 3 1000 MG CAPS Take 1,000 mg by mouth 2 (two) times daily.     [provider]     Vital Signs: BP (!) 157/85   Pulse 87   Temp 97.9 F (36.6 C) (Oral)   Resp 20   Ht 5\' 7"  (1.702 m)   Wt 74 kg   SpO2 94%   BMI 25.55 kg/m   Physical Exam Awake Remains intubated Moving all extremities, trying to remove his  ET tube.  Imaging: Ct Angio Head W Or Wo Contrast  Result Date: 03/23/2018 CLINICAL DATA:  Right-sided weakness, aphasia EXAM: CT ANGIOGRAPHY HEAD AND NECK TECHNIQUE: Multidetector CT imaging of the head and neck was performed using the standard protocol during bolus administration of intravenous contrast. Multiplanar CT image reconstructions and MIPs were obtained to evaluate the vascular anatomy. Carotid stenosis measurements (when applicable) are obtained utilizing NASCET criteria, using the distal internal carotid diameter as the denominator. CONTRAST:  43mL ISOVUE-370 IOPAMIDOL (ISOVUE-370) INJECTION 76% COMPARISON:  CT head 03/23/2018  FINDINGS: CTA NECK FINDINGS Aortic arch: Atherosclerotic aortic arch without aneurysm. Mild atherosclerotic disease and tortuosity in the proximal great vessels without significant stenosis. Right carotid system: Scattered atherosclerotic disease in the right common carotid artery. Atherosclerotic calcified plaque in the right carotid bifurcation. 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 Left carotid system: Scattered atherosclerotic disease left common carotid artery without significant stenosis. Atherosclerotic calcification left carotid bifurcation. There is decreased flow in the left common carotid artery due to acute occlusion of the proximal left internal carotid artery with thrombus present. There is no contrast distal to the proximal left internal carotid artery which is thrombosed. Left internal carotid artery is occluded to the supraclinoid segment. Vertebral arteries: Left vertebral artery dominant with moderate stenosis at the origin. Right vertebral non dominant without significant stenosis. Skeleton: Cervical spondylosis.  No acute skeletal abnormality. Other neck: Negative for mass or adenopathy in the neck. Upper chest: Transvenous pacemaker left. Interstitial fibrosis of the lungs bilaterally. No pleural effusion. Review of the MIP images confirms the above findings CTA HEAD FINDINGS Anterior circulation: Left internal carotid artery is occluded through the cavernous segment with reconstitution of the supraclinoid segment via flow from the anterior communicating artery. There is clot throughout the left M1 segment with slow flow. There is a small amount of flow in left M2 branches with occlusion of the posterior division of left M2. Both anterior cerebral arteries are patent. Right middle cerebral artery patent without stenosis. Atherosclerotic plaque in the right cavernous carotid with mild stenosis. Posterior circulation: Both vertebral arteries patent to the basilar. Left  vertebral dominant. Right PICA patent. Left PICA not visualized. AICA patent bilaterally. Basilar widely patent. Superior cerebellar artery patent. Right posterior cerebral artery patent. Probable fetal origin of the left posterior cerebral artery with supply from a hypoplastic left P1 segment. Venous sinuses: Patent Anatomic variants: None Delayed phase: Not performed Review of the MIP images confirms the above findings IMPRESSION: 1. 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 Reconstitution of the supraclinoid left internal carotid artery through the anterior communicating artery. There is clot in the left M1 and M2 segments with decreased blood flow. There is occlusion of the posterior division of the left middle cerebral artery due to clot. 2. 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 3. Moderate stenosis origin of left vertebral artery 4. Probable fetal origin left posterior cerebral artery which is supplied from a small hypoplastic left P1 segment. 5. These results were called by telephone at the time of interpretation on 03/23/2018 at 6:33 pm to Dr. Leonel Ramsay , who verbally acknowledged these results. Electronically Signed   By: Franchot Gallo M.D.   On: 03/23/2018 18:54   Ct Head Wo Contrast  Result Date: 03/24/2018 CLINICAL DATA:  77 y/o M; left ICA occlusion post intra-arterial intervention. EXAM: CT HEAD WITHOUT CONTRAST TECHNIQUE: Contiguous axial images were obtained from the base of the skull through the vertex without intravenous contrast.  COMPARISON:  03/23/2018 CT head, CTA head, and cerebral angiogram. FINDINGS: Brain: Cortical hypoattenuation with loss of gray-white differentiation and mild edema of the left lateral temporal lobe, temporal operculum, insula left parietal lobe, and the lateral aspect of the left occipital lobe compatible with late acute/early subacute infarction. No acute hemorrhage. No extra-axial collection, hydrocephalus, midline shift,  or herniation. No additional area of acute stroke. Stable background of chronic microvascular ischemic changes and volume loss of the brain. 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 Vascular: Calcific atherosclerosis of carotid siphons and vertebral arteries. No hyperdense vessel identified. Skull: Normal. Negative for fracture or focal lesion. Sinuses/Orbits: No acute finding. Other: None. IMPRESSION: 1. 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 No hemorrhage or significant mass effect. 2. Stable background of chronic microvascular ischemic changes and volume loss of the brain. 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 These results will be called to the ordering clinician or representative by the Radiologist Assistant, and communication documented in the PACS or zVision Dashboard. Electronically Signed   By: Kristine Garbe M.D.   On: 03/24/2018 20:38   Ct Angio Neck W Or Wo Contrast  Result Date: 03/23/2018 CLINICAL DATA:  Right-sided weakness, aphasia EXAM: CT ANGIOGRAPHY HEAD AND NECK TECHNIQUE: Multidetector CT imaging of the head and neck was performed using the standard protocol during bolus administration of intravenous contrast. Multiplanar CT image reconstructions and MIPs were obtained to evaluate the vascular anatomy. Carotid stenosis measurements (when applicable) are obtained utilizing NASCET criteria, using the distal internal carotid diameter as the denominator. CONTRAST:  74mL ISOVUE-370 IOPAMIDOL (ISOVUE-370) INJECTION 76% COMPARISON:  CT head 03/23/2018 FINDINGS: CTA NECK FINDINGS Aortic arch: Atherosclerotic aortic arch without aneurysm. Mild atherosclerotic disease and tortuosity in the proximal great vessels without significant stenosis. Right carotid system: Scattered atherosclerotic disease in the right common carotid artery. Atherosclerotic calcified plaque in the  right carotid bifurcation. 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 Left carotid system: Scattered atherosclerotic disease left common carotid artery without significant stenosis. Atherosclerotic calcification left carotid bifurcation. There is decreased flow in the left common carotid artery due to acute occlusion of the proximal left internal carotid artery with thrombus present. There is no contrast distal to the proximal left internal carotid artery which is thrombosed. Left internal carotid artery is occluded to the supraclinoid segment. Vertebral arteries: Left vertebral artery dominant with moderate stenosis at the origin. Right vertebral non dominant without significant stenosis. Skeleton: Cervical spondylosis.  No acute skeletal abnormality. Other neck: Negative for mass or adenopathy in the neck. Upper chest: Transvenous pacemaker left. Interstitial fibrosis of the lungs bilaterally. No pleural effusion. Review of the MIP images confirms the above findings CTA HEAD FINDINGS Anterior circulation: Left internal carotid artery is occluded through the cavernous segment with reconstitution of the supraclinoid segment via flow from the anterior communicating artery. There is clot throughout the left M1 segment with slow flow. There is a small amount of flow in left M2 branches with occlusion of the posterior division of left M2. Both anterior cerebral arteries are patent. Right middle cerebral artery patent without stenosis. Atherosclerotic plaque in the right cavernous carotid with mild stenosis. Posterior circulation: Both vertebral arteries patent to the basilar. Left vertebral dominant. Right PICA patent. Left PICA not visualized. AICA patent bilaterally. Basilar widely patent. Superior cerebellar artery patent. Right posterior cerebral artery patent. Probable fetal origin of the left posterior cerebral artery with supply from a hypoplastic left  P1 segment. Venous sinuses: Patent  Anatomic variants: None Delayed phase: Not performed Review of the MIP images confirms the above findings IMPRESSION: 1. 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 Reconstitution of the supraclinoid left internal carotid artery through the anterior communicating artery. There is clot in the left M1 and M2 segments with decreased blood flow. There is occlusion of the posterior division of the left middle cerebral artery due to clot. 2. 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 3. Moderate stenosis origin of left vertebral artery 4. Probable fetal origin left posterior cerebral artery which is supplied from a small hypoplastic left P1 segment. 5. These results were called by telephone at the time of interpretation on 03/23/2018 at 6:33 pm to Dr. Leonel Ramsay , who verbally acknowledged these results. Electronically Signed   By: Franchot Gallo M.D.   On: 03/23/2018 18:54   [place]  Result Date: 03/25/2018 INDICATION: Acute onset of aphasia, left gaze deviation and right-sided paresis. Occluded left middle cerebral artery, and left internal carotid artery on CT angiogram of the head and neck. EXAM: 1. EMERGENT LARGE VESSEL OCCLUSION THROMBOLYSIS (anterior CIRCULATION) COMPARISON:  CT angiogram of the head and neck of 03/23/2018. MEDICATIONS: Ancef 2 g IV antibiotic was administered within 1 hour of the procedure. ANESTHESIA/SEDATION: General anesthesia CONTRAST:  Isovue 300 approximately 80 mL. FLUOROSCOPY TIME:  Fluoroscopy Time: 33 minutes 48 seconds (2127 mGy). COMPLICATIONS: None immediate. TECHNIQUE: Following a full explanation of the procedure along with the potential associated complications, an informed witnessed consent was obtained from the patient's spouse and daughter. The risks of intracranial hemorrhage of 10%, worsening neurological deficit, ventilator dependency, death and inability to revascularize were all reviewed in detail with the patient's spouse and  daughter. The patient was then put under general anesthesia by the Department of Anesthesiology at [facility] The right groin was prepped and draped in the usual sterile fashion. Thereafter using modified Seldinger technique, transfemoral access into the right common femoral artery was obtained without difficulty. Over a 0.035 inch guidewire a 5 French Pinnacle sheath was inserted. Through this, and also over a 0.035 inch guidewire a 5 [place] JB 1 catheter was advanced to the aortic arch region and selectively positioned in the left common carotid artery. FINDINGS: The left common carotid arteriogram demonstrates mild stenosis at the origin of the left external carotid artery. More distally, the left external carotid artery branches are seen to opacify normally. The left internal carotid artery at the bulb and just distal to the bulb opacifies with stagnation of blood flow. There slow ascent of contrast into the proximal left internal carotid artery in the cervical region. More distally, there is no reconstitution of the left internal carotid artery. No reconstitution is noted from the left external carotid artery branches either. PROCEDURE: ENDOVASCULAR REVASCULARIZATION OF OCCLUDED LEFT INTERNAL CAROTID ARTERY EXTRA CRANIALLY AND INTRACRANIALLY WITH 1 PASS USING THE 5 MM X 33 MM EMBOTRAP RETRIEVAL DEVICE, AND ALSO OF THE LEFT MIDDLE CEREBRAL ARTERY SUPERIOR DIVISION WITH 1 PASS USING THE 5 MM X 33 MM EMBOTRAP DEVICE ACHIEVING A TICI 3 REVASCULARIZATION. The diagnostic JB 1 catheter in the left common carotid artery was exchanged over a 0.035 inch 300 cm Rosen exchange guidewire for an 8 French 55 cm Brite tip neurovascular sheath using biplane roadmap technique and constant fluoroscopic guidance. Good aspiration was obtained from the hub of the 8 French neurovascular sheath. This was then connected to continuous heparinized saline infusion. Over the  Rosen exchange guidewire, an 8 [place] 85 cm FlowGate  balloon guide catheter which had been prepped with 50% contrast and 50% heparinized saline infusion was advanced and positioned just proximal to the left common carotid bifurcation. The guidewire was removed. Good aspiration obtained from the hub of the [facility] guide catheter. A gentle contrast injection demonstrated no evidence of dissection or of intraluminal filling defects. Over a 0.014 inch Softip Synchro micro guidewire, a combination of a 115 cm 5 [place] Navien guide catheter inside of which was an 021 Trevo ProVue microcatheter was advanced to the distal end of the [facility] guide catheter. The combination was then advanced with the micro guidewire leading with a J-tip configuration without any difficulty. The combination was advanced to the supraclinoid left ICA. There, the micro guidewire was removed. Slow aspiration of blood was noted at the hub of the microcatheter. A gentle contrast injection through the microcatheter demonstrated faint opacification of the left MCA M1 region. The micro guidewire was then reinserted. With a torque device, the micro guidewire was advanced into the M1 M2 junction of the inferior division of the left middle cerebral artery followed by the microcatheter. The guidewire was removed. Good aspiration obtained from the hub of the microcatheter. A gentle contrast injection demonstrated safe position of tip of the microcatheter. This was then connected to continuous heparinized saline infusion. A gentle control arteriogram performed through the Navien guide catheter in the proximal cavernous segment demonstrated complete angiographic nonvisualization at the level of the ophthalmic artery. A 5 mm x 33 mm Embotrap retrieval device was then advanced in a coaxial manner and with constant heparinized saline infusion to the distal end of the microcatheter. The proximal and the distal landing zones were then defined. The O ring on the delivery microcatheter was then loosened. With slight  forward gentle traction with the right hand on the delivery micro guidewire, with the left hand the retrieval device was then retrieved. A gentle control arteriogram performed through the Navien 5 [place] guide catheter in the left internal carotid artery proximal cavernous segment demonstrated continued nonvisualization of the distal left internal carotid artery in the left middle cerebral artery distribution. Proximal flow arrest was then initiated by advancing the [facility] guide catheter into the proximal left internal carotid artery and inflating the balloon. With constant aspiration being applied at the hub of the [facility] guide catheter with a 60 mL syringe, and the Navien guide catheter with a Penumbra vacuum suction device, the combination of the retrieval device, the microcatheter and the 5 [place] Navien guide catheter were retrieved and removed. Aspiration was continued as the proximal flow arrest was then reversed by deflating the balloon of the [facility] guide catheter. Free aspiration of blood was noted at the hub of the [facility] guide catheter. A gentle control arteriogram performed through the [facility] guide catheter in the proximal left internal carotid artery demonstrated complete revascularization of the occluded left internal carotid artery proximally and distally. A wide left posterior communicating artery was seen opacifying the left posterior cerebral artery distribution. The left anterior and the left middle cerebral arteries were now widely patent. There continued to be a filling defect noted in the superior division of the left middle cerebral artery. The combination of the 5 [place] Navien guide catheter inside of which was an 021 Trevo ProVue microcatheter was again advanced using biplane roadmap technique and constant fluoroscopic guidance over a 0.014 inch Softip Synchro micro guidewire to the supraclinoid left ICA. The micro guidewire was then gently  manipulated with the torque device and  advanced into the occluded superior division with a filling defect in the M2 M3 region followed by the microcatheter. The guidewire was removed. There was slow aspiration of blood at the hub of the microcatheter. A gentle control arteriogram through the microcatheter demonstrated safe position of tip of the microcatheter. The 5 mm x 33 mm Embotrap retrieval device was again advanced as described above to the distal end of the microcatheter. The O ring on the delivery microcatheter was then loosened. With slight forward gentle traction with the right hand the delivery micro guidewire with the left hand the delivery microcatheter was retrieved deploying the retrieval device. A control arteriogram performed through the Navien guide catheter in the distal cavernous left ICA demonstrated a TICI 3 revascularization. With proximal flow arrest again initiating the left internal carotid artery by inflating the FlowGate balloon guide catheter, the combination of the retrieval device, and the microcatheter and the 5 [place] Navien guide catheter were then retrieved and removed as constant aspiration was applied with a 60 mL syringe at the hub of the [facility] guide catheter, and a Penumbra suction vacuum device at the hub of the Navien guide catheter. Proximal flow arrest was continued as the balloon was deflated in the left internal carotid artery. Clot was noted in the aspirate, and also in the retrieval device. Earlier the aspirate had revealed copious amounts of dark clot at the time of the first pass. A control arteriogram performed through the 8 [place] FlowGate guide catheter in the left internal carotid artery demonstrated a complete revascularization of the previously noted filling defect within the superior division M2 segment of the left middle cerebral artery. Overall a TICI 3 revascularization had been established. Moderate vasospasm at the superior division of the left middle cerebral artery proximally responded to  2 aliquots of 25 mcg nitroglycerin intra-arterially in the left internal carotid artery. Throughout the procedure, the patient's blood pressure and neurological status remained stable. No evidence of bradycardia or mass-effect or extravasation of contrast was noted. The FlowGate balloon guide catheter, and the 8 French Brite tip sheath were then removed over a 0.035 inch guidewire and replaced with an 8 [place] Pinnacle sheath. This was left in situ and connected to continuous heparinized saline infusion. The right groin appeared soft without evidence of hematoma. Distal pulses remained palpable in the dorsalis pedis, and posterior tibial regions on the right side, and Dopplerable DP and PT on the left side unchanged. Patient's CT angiogram revealed no evidence of a gross hemorrhage or mass effect or midline shift. The patient was left intubated on account of his pulmonary fibrosis. He was then transferred to the neuro ICU to continue with further post revascularization of large vessel ischemic stroke. IMPRESSION: Status post endovascular complete revascularization of occluded left internal carotid artery extra cranially and intracranially with 1 pass with the 5 mm x 33 mm Embotrap retrieval device. Status post endovascular complete revascularization of occluded superior division of the left middle cerebral artery with 1 pass with a 5 mm x 33 mm Embotrap retrieval device achieving a TICI 3 revascularization. PLAN: Follow-up in clinic 1 month post discharge. Electronically Signed   By: Luanne Bras M.D.   On: 03/24/2018 11:13   Dg Chest Port 1 View  Result Date: 03/25/2018 CLINICAL DATA:  Initial evaluation for endotracheal tube placement EXAM: PORTABLE CHEST 1 VIEW COMPARISON:  Prior radiograph from earlier the same day. FINDINGS: Tip of endotracheal tube positioned approximately 3.5 cm above  the carina. Enteric tube courses into the abdomen. Left-sided pacemaker/AICD noted. Cardiac and mediastinal  silhouettes are stable, and remain within normal limits. Lungs remain hypoinflated. Chronic interstitial changes with superimposed mild bibasilar atelectasis and/or infiltrates again noted,, perhaps mildly improved. No large pleural effusion. No pneumothorax. No acute osseous abnormality. IMPRESSION: 1. Tip of the endotracheal tube 3.5 cm above the carina. 2. Shallow lung inflation with chronic interstitial changes. Superimposed bibasilar opacities could reflect atelectasis and/or infiltrates, slightly improved from prior radiograph earlier today. Electronically Signed   By: Jeannine Boga M.D.   On: 03/25/2018 19:03   Portable Chest X-ray  Result Date: 03/25/2018 CLINICAL DATA:  Intubation. EXAM: PORTABLE CHEST 1 VIEW COMPARISON:  Twelve 05/2017.  02/18/2018. FINDINGS: Endotracheal tube tip 3 cm above the carina. NG tube tip in the stomach. Heart size normal. Diffuse bilateral pulmonary interstitial prominence noted. Interstitial changes are stable and most likely represent chronic interstitial lung disease. Mild bibasilar atelectasis/infiltrates. No pleural effusion or pneumothorax. IMPRESSION: 1. Endotracheal tube tip noted 3 cm above the carina. NG tube tip noted in stomach. 3.  AICD in stable position.  Heart size stable. 4. Stable chronic interstitial changes. Mild bibasilar atelectasis/infiltrates again noted without interim change. Electronically Signed   By: Marcello Moores  Register   On: 03/25/2018 10:13   Dg Chest Port 1 View  Result Date: 03/24/2018 CLINICAL DATA:  Hypoxia EXAM: PORTABLE CHEST 1 VIEW COMPARISON:  Chest radiograph 03/23/2018 FINDINGS: Multi lead AICD device overlies the left hemithorax. Leads are stable in position. Interval extubation and removal of enteric tube. Stable cardiomegaly. Interval increase in patchy consolidation within the left mid lower lung. Scattered coarse interstitial opacities are similar when compared to prior. IMPRESSION: Increased patchy consolidation left  lower lung may represent pneumonia in the appropriate clinical setting. Followup PA and lateral chest X-ray is recommended in 3-4 weeks following trial of antibiotic therapy to ensure resolution and exclude underlying malignancy. Coarse interstitial opacities bilaterally may represent edema superimposed upon chronic interstitial process. Electronically Signed   By: Lovey Newcomer M.D.   On: 03/24/2018 21:43   Dg Chest Port 1 View  Result Date: 03/23/2018 CLINICAL DATA:  Post intubation and orogastric tube placement. EXAM: PORTABLE CHEST 1 VIEW COMPARISON:  02/18/2018 FINDINGS: Endotracheal tube has tip 2.9 cm above the carina. Placement of enteric tube with tip over the stomach in the left upper quadrant. Left-sided pacemaker unchanged. Lungs are hypoinflated demonstrate stable chronic bilateral interstitial disease. Mild prominence of the perihilar markings suggesting mild degree of vascular congestion cardiomediastinal silhouette and remainder of the exam is unchanged. IMPRESSION: Suggestion mild vascular congestion.  Chronic interstitial disease. Tubes and lines as described. Electronically Signed   By: Marin Olp M.D.   On: 03/23/2018 21:56   Ir Percutaneous Art Thrombectomy/infusion Intracranial Inc Diag Angio  Result Date: 03/25/2018 INDICATION: Acute onset of aphasia, left gaze deviation and right-sided paresis. Occluded left middle cerebral artery, and left internal carotid artery on CT angiogram of the head and neck. EXAM: 1. EMERGENT LARGE VESSEL OCCLUSION THROMBOLYSIS (anterior CIRCULATION) COMPARISON:  CT angiogram of the head and neck of 03/23/2018. MEDICATIONS: Ancef 2 g IV antibiotic was administered within 1 hour of the procedure. ANESTHESIA/SEDATION: General anesthesia CONTRAST:  Isovue 300 approximately 80 mL. FLUOROSCOPY TIME:  Fluoroscopy Time: 33 minutes 48 seconds (2127 mGy). COMPLICATIONS: None immediate. TECHNIQUE: Following a full explanation of the procedure along with the potential  associated complications, an informed witnessed consent was obtained from the patient's spouse and daughter. The risks of intracranial  hemorrhage of 10%, worsening neurological deficit, ventilator dependency, death and inability to revascularize were all reviewed in detail with the patient's spouse and daughter. The patient was then put under general anesthesia by the Department of Anesthesiology at [facility]. The right groin was prepped and draped in the usual sterile fashion. Thereafter using modified Seldinger technique, transfemoral access into the right common femoral artery was obtained without difficulty. Over a 0.035 inch guidewire a 5 French Pinnacle sheath was inserted. Through this, and also over a 0.035 inch guidewire a 5 [place] JB 1 catheter was advanced to the aortic arch region and selectively positioned in the left common carotid artery. FINDINGS: The left common carotid arteriogram demonstrates mild stenosis at the origin of the left external carotid artery. More distally, the left external carotid artery branches are seen to opacify normally. The left internal carotid artery at the bulb and just distal to the bulb opacifies with stagnation of blood flow. There slow ascent of contrast into the proximal left internal carotid artery in the cervical region. More distally, there is no reconstitution of the left internal carotid artery. No reconstitution is noted from the left external carotid artery branches either. PROCEDURE: ENDOVASCULAR REVASCULARIZATION OF OCCLUDED LEFT INTERNAL CAROTID ARTERY EXTRA CRANIALLY AND INTRACRANIALLY WITH 1 PASS USING THE 5 MM X 33 MM EMBOTRAP RETRIEVAL DEVICE, AND ALSO OF THE LEFT MIDDLE CEREBRAL ARTERY SUPERIOR DIVISION WITH 1 PASS USING THE 5 MM X 33 MM EMBOTRAP DEVICE ACHIEVING A TICI 3 REVASCULARIZATION. The diagnostic JB 1 catheter in the left common carotid artery was exchanged over a 0.035 inch 300 cm Rosen exchange guidewire for an 8 French 55 cm Brite  tip neurovascular sheath using biplane roadmap technique and constant fluoroscopic guidance. Good aspiration was obtained from the hub of the 8 French neurovascular sheath. This was then connected to continuous heparinized saline infusion. Over the [employer] guidewire, an 8 [place] 85 cm FlowGate balloon guide catheter which had been prepped with 50% contrast and 50% heparinized saline infusion was advanced and positioned just proximal to the left common carotid bifurcation. The guidewire was removed. Good aspiration obtained from the hub of the [facility] guide catheter. A gentle contrast injection demonstrated no evidence of dissection or of intraluminal filling defects. Over a 0.014 inch Softip Synchro micro guidewire, a combination of a 115 cm 5 [place] Navien guide catheter inside of which was an 021 Trevo ProVue microcatheter was advanced to the distal end of the [facility] guide catheter. The combination was then advanced with the micro guidewire leading with a J-tip configuration without any difficulty. The combination was advanced to the supraclinoid left ICA. There, the micro guidewire was removed. Slow aspiration of blood was noted at the hub of the microcatheter. A gentle contrast injection through the microcatheter demonstrated faint opacification of the left MCA M1 region. The micro guidewire was then reinserted. With a torque device, the micro guidewire was advanced into the M1 M2 junction of the inferior division of the left middle cerebral artery followed by the microcatheter. The guidewire was removed. Good aspiration obtained from the hub of the microcatheter. A gentle contrast injection demonstrated safe position of tip of the microcatheter. This was then connected to continuous heparinized saline infusion. A gentle control arteriogram performed through the Navien guide catheter in the proximal cavernous segment demonstrated complete angiographic nonvisualization at the level of the ophthalmic  artery. A 5 mm x 33 mm Embotrap retrieval device was then advanced in a coaxial manner and with constant  heparinized saline infusion to the distal end of the microcatheter. The proximal and the distal landing zones were then defined. The O ring on the delivery microcatheter was then loosened. With slight forward gentle traction with the right hand on the delivery micro guidewire, with the left hand the retrieval device was then retrieved. A gentle control arteriogram performed through the Navien 5 [place] guide catheter in the left internal carotid artery proximal cavernous segment demonstrated continued nonvisualization of the distal left internal carotid artery in the left middle cerebral artery distribution. Proximal flow arrest was then initiated by advancing the [facility] guide catheter into the proximal left internal carotid artery and inflating the balloon. With constant aspiration being applied at the hub of the [facility] guide catheter with a 60 mL syringe, and the Navien guide catheter with a Penumbra vacuum suction device, the combination of the retrieval device, the microcatheter and the 5 [place] Navien guide catheter were retrieved and removed. Aspiration was continued as the proximal flow arrest was then reversed by deflating the balloon of the [facility] guide catheter. Free aspiration of blood was noted at the hub of the [facility] guide catheter. A gentle control arteriogram performed through the [facility] guide catheter in the proximal left internal carotid artery demonstrated complete revascularization of the occluded left internal carotid artery proximally and distally. A wide left posterior communicating artery was seen opacifying the left posterior cerebral artery distribution. The left anterior and the left middle cerebral arteries were now widely patent. There continued to be a filling defect noted in the superior division of the left middle cerebral artery. The combination of the 5 [place] Navien  guide catheter inside of which was an 021 Trevo ProVue microcatheter was again advanced using biplane roadmap technique and constant fluoroscopic guidance over a 0.014 inch Softip Synchro micro guidewire to the supraclinoid left ICA. The micro guidewire was then gently manipulated with the torque device and advanced into the occluded superior division with a filling defect in the M2 M3 region followed by the microcatheter. The guidewire was removed. There was slow aspiration of blood at the hub of the microcatheter. A gentle control arteriogram through the microcatheter demonstrated safe position of tip of the microcatheter. The 5 mm x 33 mm Embotrap retrieval device was again advanced as described above to the distal end of the microcatheter. The O ring on the delivery microcatheter was then loosened. With slight forward gentle traction with the right hand the delivery micro guidewire with the left hand the delivery microcatheter was retrieved deploying the retrieval device. A control arteriogram performed through the Navien guide catheter in the distal cavernous left ICA demonstrated a TICI 3 revascularization. With proximal flow arrest again initiating the left internal carotid artery by inflating the FlowGate balloon guide catheter, the combination of the retrieval device, and the microcatheter and the 5 [place] Navien guide catheter were then retrieved and removed as constant aspiration was applied with a 60 mL syringe at the hub of the [facility] guide catheter, and a Penumbra suction vacuum device at the hub of the Navien guide catheter. Proximal flow arrest was continued as the balloon was deflated in the left internal carotid artery. Clot was noted in the aspirate, and also in the retrieval device. Earlier the aspirate had revealed copious amounts of dark clot at the time of the first pass. A control arteriogram performed through the 8 [place] FlowGate guide catheter in the left internal carotid artery  demonstrated a complete revascularization of the previously noted filling defect  within the superior division M2 segment of the left middle cerebral artery. Overall a TICI 3 revascularization had been established. Moderate vasospasm at the superior division of the left middle cerebral artery proximally responded to 2 aliquots of 25 mcg nitroglycerin intra-arterially in the left internal carotid artery. Throughout the procedure, the patient's blood pressure and neurological status remained stable. No evidence of bradycardia or mass-effect or extravasation of contrast was noted. The FlowGate balloon guide catheter, and the 8 French Brite tip sheath were then removed over a 0.035 inch guidewire and replaced with an 8 [place] Pinnacle sheath. This was left in situ and connected to continuous heparinized saline infusion. The right groin appeared soft without evidence of hematoma. Distal pulses remained palpable in the dorsalis pedis, and posterior tibial regions on the right side, and Dopplerable DP and PT on the left side unchanged. Patient's CT angiogram revealed no evidence of a gross hemorrhage or mass effect or midline shift. The patient was left intubated on account of his pulmonary fibrosis. He was then transferred to the neuro ICU to continue with further post revascularization of large vessel ischemic stroke. IMPRESSION: Status post endovascular complete revascularization of occluded left internal carotid artery extra cranially and intracranially with 1 pass with the 5 mm x 33 mm Embotrap retrieval device. Status post endovascular complete revascularization of occluded superior division of the left middle cerebral artery with 1 pass with a 5 mm x 33 mm Embotrap retrieval device achieving a TICI 3 revascularization. PLAN: Follow-up in clinic 1 month post discharge. Electronically Signed   By: Luanne Bras M.D.   On: 03/24/2018 11:13   Ct Head Code Stroke Wo Contrast  Result Date: 03/23/2018 CLINICAL  DATA:  Code stroke.  Right-sided weakness, aphasia EXAM: CT HEAD WITHOUT CONTRAST TECHNIQUE: Contiguous axial images were obtained from the base of the skull through the vertex without intravenous contrast. COMPARISON:  None. FINDINGS: Brain: Moderate atrophy. Patchy hypodensity in the cerebral white matter bilaterally compatible with mild chronic microvascular ischemia. 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 Negative for acute hemorrhage.  No acute infarct or mass. Vascular: Hyperdense left MCA. Hyperdense terminal left internal carotid artery. Atherosclerotic calcification cavernous carotid bilaterally. Skull: Negative Sinuses/Orbits: Paranasal sinuses clear. Bilateral cataract surgery. Other: None ASPECTS ([place] Stroke Program Early CT Score) - Ganglionic level infarction (caudate, lentiform nuclei, internal capsule, insula, M1-M3 cortex): 7 - Supraganglionic infarction (M4-M6 cortex): 3 Total score (0-10 with 10 being normal): 10 IMPRESSION: 1. Hyperdense terminal left internal carotid artery and left MCA compatible with thrombosis. No evidence of acute infarct or hemorrhage 2. Atrophy and chronic microvascular ischemic changes in the white matter. 3. ASPECTS is 10 4. These results were called by telephone at the time of interpretation on 03/23/2018 at 6:33 pm to Dr. Leonel Ramsay, who verbally acknowledged these results. Electronically Signed   By: Franchot Gallo M.D.   On: 03/23/2018 18:35    Labs:  CBC: Recent Labs    03/25/18 0632 03/25/18 1648 03/26/18 0423 03/27/18 0308  WBC 13.5* 13.9* 13.3* 12.8*  HGB 11.7* 11.2* 10.9* 10.4*  HCT 36.7* 35.5* 35.0* 33.1*  PLT 203 203 183 174    COAGS: Recent Labs    03/23/18 1831 03/25/18 0632  INR 1.16 1.07  APTT 27 31    BMP: Recent Labs    03/25/18 0632 03/25/18 1648 03/26/18 0423 03/27/18 0308  NA 138 140 139 139  K 3.9 3.6 4.0 3.5  CL 108 107 104  105  CO2 22 25 22 24   GLUCOSE 149* 113* 233* 236*    BUN 16 15 17 23   CALCIUM 8.6* 8.5* 8.7* 8.7*  CREATININE 0.90 1.00 0.87 0.66  GFRNONAA >60 >60 >60 >60  GFRAA >60 >60 >60 >60    LIVER FUNCTION TESTS: Recent Labs    05/28/17 0951 11/26/17 0918 03/23/18 1831  BILITOT 0.5 0.5 1.2  AST 26 21 48*  ALT 27 20 28   ALKPHOS 42 46 33*  PROT 7.4 7.6 6.2*  ALBUMIN 4.2 4.1 3.1*    Assessment and Plan:  Code Stroke  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 Estanislado Pandy.  Plan to wean and extubate today.  Electronically Signed: Murrell Redden, PA-C 03/27/2018, 10:34 AM    I spent a total of 15 Minutes at the the patient's bedside AND on the patient's hospital floor or unit, greater than 50% of which was counseling/coordinating care for f/u thrombectomy/stroke.

## 2018-03-27 NOTE — Progress Notes (Signed)
PT Cancellation Note  Patient Details Name: FERNIE GRIMM MRN: [id number] DOB: 02-01-1941   Cancelled Treatment:    Reason Eval/Treat Not Completed: Medical issues which prohibited therapy(Pt weaning per nurse.  Nurse asked PT to HOLD today. )   Denice Paradise 03/27/2018, 10:23 AM [place] Pager:  [phone]  Office:  [phone]

## 2018-03-27 NOTE — Progress Notes (Signed)
eLink Physician-Brief Progress Note Patient Name: Terry Peck DOB: [birth year] MRN: [id number]   Date of Service  03/27/2018  HPI/Events of Note  Hyperglycemia on standard coverage insulin regimen  eICU Interventions  Standard coverage insulin regimen discontinued, resistant insulin coverage regimen ordered        Frederik Pear 03/27/2018, 12:38 AM

## 2018-03-27 NOTE — Progress Notes (Signed)
Inpatient Diabetes Program Recommendations  AACE/ADA: New Consensus Statement on Inpatient Glycemic Control (2015)  Target Ranges:  Prepandial:   less than 140 mg/dL      Peak postprandial:   less than 180 mg/dL (1-2 hours)      Critically ill patients:  140 - 180 mg/dL   Lab Results  Component Value Date   GLUCAP 202 (H) 03/27/2018   HGBA1C 7.0 (H) 03/24/2018    Review of Glycemic Control Results for Terry Peck, Terry Peck (MRN [id number]) as of 03/27/2018 12:37  Ref. Range 03/27/2018 03:36 03/27/2018 08:02 03/27/2018 11:40  Glucose-Capillary Latest Ref Range: 70 - 99 mg/dL 199 (H) 223 (H) 202 (H)   Diabetes history: Type 2 DM Outpatient Diabetes medications: Lantus 50 units QD, Metformin 2000 mg QAM, Actos 30 mg QAM Current orders for Inpatient glycemic control: none Solumedrol 1000 mg QD/ 69ml/1hr  Inpatient Diabetes Program Recommendations:    Noted orders were discontinued. BG trending above inpatient goals. Recommend adding back Novolog 3-9 units Q4H under ICU phase 1 protocol.  Consider adding Novolog 3 units Q4H for tube feed coverage (Hold if tube feeds are stopped for any reason). RN to reach out to MD for additional orders in round.  Thanks, Bronson Curb, MSN, RNC-OB Diabetes Coordinator [phone] (8a-5p)

## 2018-03-28 DIAGNOSIS — J9611 Chronic respiratory failure with hypoxia: Secondary | ICD-10-CM

## 2018-03-28 DIAGNOSIS — J84112 Idiopathic pulmonary fibrosis: Secondary | ICD-10-CM

## 2018-03-28 LAB — BASIC METABOLIC PANEL
Anion gap: 13 (ref 5–15)
BUN: 25 mg/dL — ABNORMAL HIGH (ref 8–23)
CO2: 22 mmol/L (ref 22–32)
Calcium: 8.4 mg/dL — ABNORMAL LOW (ref 8.9–10.3)
Chloride: 106 mmol/L (ref 98–111)
Creatinine, Ser: 0.68 mg/dL (ref 0.61–1.24)
GFR calc Af Amer: >60 mL/min (ref >60)
GFR calc non Af Amer: >60 mL/min (ref >60)
Glucose, Bld: 250 mg/dL — ABNORMAL HIGH (ref 70–99)
POTASSIUM: 3.6 mmol/L (ref 3.5–5.1)
Sodium: 141 mmol/L (ref 135–145)

## 2018-03-28 LAB — GLUCOSE, CAPILLARY
Glucose-Capillary: 210 mg/dL — ABNORMAL HIGH (ref 70–99)
Glucose-Capillary: 223 mg/dL — ABNORMAL HIGH (ref 70–99)
Glucose-Capillary: 191 mg/dL — ABNORMAL HIGH (ref 70–99)
Glucose-Capillary: 152 mg/dL — ABNORMAL HIGH (ref 70–99)
Glucose-Capillary: 64 mg/dL — ABNORMAL LOW (ref 70–99)

## 2018-03-28 LAB — CULTURE, RESPIRATORY W GRAM STAIN: Culture: NORMAL

## 2018-03-28 LAB — CBC
HCT: 32.8 % — ABNORMAL LOW (ref 39.0–52.0)
Hemoglobin: 10.4 g/dL — ABNORMAL LOW (ref 13.0–17.0)
MCH: 29.2 pg (ref 26.0–34.0)
MCHC: 31.7 g/dL (ref 30.0–36.0)
MCV: 92.1 fL (ref 80.0–100.0)
Platelets: 170 10*3/uL (ref 150–400)
RBC: 3.56 MIL/uL — AB (ref 4.22–5.81)
RDW: 14.8 % (ref 11.5–15.5)
WBC: 9.8 10*3/uL (ref 4.0–10.5)
nRBC: 0 % (ref 0.0–0.2)

## 2018-03-28 LAB — TRIGLYCERIDES: Triglycerides: 99 mg/dL (ref <150)

## 2018-03-28 MED ORDER — DEXTROSE 50 % IV SOLN
INTRAVENOUS | Status: AC
  Administered 2018-03-28: 25 mL
  Filled 2018-03-28: qty 50

## 2018-03-28 MED ORDER — FUROSEMIDE 40 MG PO TABS
40.0000 mg | ORAL_TABLET | Freq: Every day | ORAL | Status: DC
  Administered 2018-03-30 – 2018-04-04 (×5): 40 mg via ORAL
  Filled 2018-03-28 (×5): qty 1

## 2018-03-28 MED ORDER — INSULIN ASPART 100 UNIT/ML ~~LOC~~ SOLN
3.0000 [IU] | SUBCUTANEOUS | Status: DC
  Administered 2018-03-28 (×2): 3 [IU] via SUBCUTANEOUS

## 2018-03-28 NOTE — Progress Notes (Signed)
Pt extubated to bipap per MD order. SPo2 immediately dropped to 71% with increased WOB. Pt fighting bipap at this time. Leak around 39.

## 2018-03-28 NOTE — Significant Event (Signed)
Family meeting held with son and daughter.  Discussed goals of care including whether patient would wish to be reintubated.  If he were to be reintubated he would likely need tracheostomy due to his history of reintubation and respiratory failure.  After discussion, family does not want patient to be reintubated.  Change CODE STATUS to DNI.

## 2018-03-28 NOTE — Progress Notes (Signed)
PT Cancellation Note  Patient Details Name: Terry Peck MRN: [id number] DOB: 1940-05-22   Cancelled Treatment:    Reason Eval/Treat Not Completed: Medical issues which prohibited therapy .  Pt extubated, but requiring BiPAP.  RN recommended holding.  PT to check back in on pt on Monday 03/31/18. Thanks,  Barbarann Ehlers. Ulices Maack, PT, DPT  Acute Rehabilitation [phone] pager [phone]) [phone] office   Wells Guiles B Preet Mangano 03/28/2018, 3:38 PM

## 2018-03-28 NOTE — Progress Notes (Signed)
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 Estanislado Pandy. Patientlaying in bed intubated and sedated. Dr. Loanne Drilling in room, reports possible extubation today. Can spontaneouslymove all extremities. Right groin incision c/d/i.   Allergies: Penicillins; Iodine; and Fish oil  Medications: Prior to Admission medications   Medication Sig Start Date End Date Taking? Authorizing Provider  aspirin EC 81 MG tablet Take 81 mg by mouth daily.   Yes [provider]  atorvastatin (LIPITOR) 20 MG tablet TAKE 1 TABLET BY MOUTH ONCE DAILY Patient taking differently: Take 20 mg by mouth daily.  12/30/17  Yes Nahser, Wonda Cheng, MD  carvedilol (COREG) 25 MG tablet TAKE 1 TABLET BY MOUTH TWICE DAILY WITH MEALS Patient taking differently: Take 25 mg by mouth 2 (two) times daily with a meal.  02/12/18  Yes Nahser, Wonda Cheng, MD  ibuprofen (ADVIL,MOTRIN) 200 MG tablet Take 200-300 mg by mouth 2 (two) times daily as needed for mild pain.    Yes [provider]  Insulin Glargine (LANTUS SOLOSTAR) 100 UNIT/ML Solostar Pen INJECT 50 UNITS SUBCUTANEOUSLY ONCE DAILY Patient taking differently: Inject 50 Units into the skin daily.  10/03/17  Yes Biagio Borg, MD  Lutein 20 MG CAPS Take 20 mg by mouth daily.   Yes [provider]  metFORMIN (GLUCOPHAGE-XR) 500 MG 24 hr tablet Take 4 tablets (2,000 mg total) by mouth daily with breakfast. 11/26/17  Yes Biagio Borg, MD  methocarbamol (ROBAXIN) 500 MG tablet TAKE ONE TABLET BY MOUTH ONCE DAILY AS NEEDED Patient taking differently: Take 500 mg by mouth daily as needed for muscle spasms.  12/18/16  Yes Biagio Borg, MD  Multiple Vitamin (MULTIVITAMIN WITH MINERALS)  TABS Take 1 tablet by mouth daily.   Yes [provider]  omeprazole (PRILOSEC) 20 MG capsule TAKE 1 CAPSULE BY MOUTH TWICE DAILY Patient taking differently: Take 20 mg by mouth 2 (two) times daily before a meal.  02/11/18  Yes Biagio Borg, MD  pioglitazone (ACTOS) 30 MG tablet TAKE 1 TABLET BY MOUTH ONCE DAILY Patient taking differently: Take 30 mg by mouth daily.  12/02/17  Yes Biagio Borg, MD  budesonide (PULMICORT) 180 MCG/ACT inhaler Inhale 2 puffs into the lungs 2 (two) times daily. Patient not taking: Reported on 03/25/2018 02/18/18   Noralee Space, MD  Calcium-Magnesium-Zinc 1000-400-15 MG TABS Take 1 tablet by mouth daily.     [provider]  Cholecalciferol (VITAMIN D) 1000 UNITS capsule Take 1,000 Units by mouth daily.     [provider]  Cyanocobalamin (B-12 PO) Take 1 tablet by mouth daily.     [provider]  Insulin Pen Needle (RELION PEN NEEDLES) 32G X 4 MM MISC Use as directed once daily E11.9 10/07/17   Biagio Borg, MD  OMEGA 3 1000 MG CAPS Take 1,000 mg by mouth 2 (two) times daily.     [provider]     Vital Signs: BP 121/65   Pulse 61   Temp 97.6 F (36.4 C)   Resp 19   Ht 5\' 7"  (1.702 m)   Wt 185 lb 10 oz (84.2 kg)   SpO2 100%   BMI 29.07 kg/m   Physical Exam  Constitutional: He appears well-developed and well-nourished.  No distress.  Intubated and sedated.  Pulmonary/Chest: Effort normal. No respiratory distress.  Intubated and sedated.  Neurological:  Intubated and sedated. Speech and comprehension not assessed. PERRL bilaterally. EOMs not assessed. Visual fields not assessed. Facial asymmetry not assessed. Tongue midline not assessed. Can spontaneously move all extremities. Pronator drift not assessed. Fine motor and coordination not assessed. Gait not assessed. Romberg not assessed. Heel to toe not assessed. Distal pulses 2+ bilaterally.  Skin: Skin is warm and dry.  Right groin incision  soft without active bleeding or hematoma.  Psychiatric:  Intubated and sedated.  Nursing note and vitals reviewed.   Imaging: Ct Head Wo Contrast  Result Date: 03/24/2018 CLINICAL DATA:  77 y/o M; left ICA occlusion post intra-arterial intervention. EXAM: CT HEAD WITHOUT CONTRAST TECHNIQUE: Contiguous axial images were obtained from the base of the skull through the vertex without intravenous contrast. COMPARISON:  03/23/2018 CT head, CTA head, and cerebral angiogram. FINDINGS: Brain: Cortical hypoattenuation with loss of gray-white differentiation and mild edema of the left lateral temporal lobe, temporal operculum, insula left parietal lobe, and the lateral aspect of the left occipital lobe compatible with late acute/early subacute infarction. No acute hemorrhage. No extra-axial collection, hydrocephalus, midline shift, or herniation. No additional area of acute stroke. Stable background of chronic microvascular ischemic changes and volume loss of the brain. 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 Vascular: Calcific atherosclerosis of carotid siphons and vertebral arteries. No hyperdense vessel identified. Skull: Normal. Negative for fracture or focal lesion. Sinuses/Orbits: No acute finding. Other: None. IMPRESSION: 1. 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 No hemorrhage or significant mass effect. 2. Stable background of chronic microvascular ischemic changes and volume loss of the brain. 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 These results will be called to the ordering clinician or representative by the Radiologist Assistant, and communication documented in the PACS or zVision Dashboard. Electronically Signed   By: Kristine Garbe M.D.   On: 03/24/2018 20:38   Dg Chest Port 1 View  Result Date: 03/27/2018 CLINICAL DATA:  Congestive heart failure, endotracheal tube EXAM:  PORTABLE CHEST 1 VIEW COMPARISON:  Portable chest 03/25/2017 FINDINGS: The tip of the endotracheal tube is approximately 4.1 cm above the carina. NG tube extends into the stomach. Permanent pacemaker is present and cardiomegaly is stable. There is mild pulmonary vascular congestion present. IMPRESSION: 1. Cardiomegaly and probable mild pulmonary vascular congestion superimposed on chronic change. 2. Tip of endotracheal tube 4.1 cm above the carina. Electronically Signed   By: Ivar Drape M.D.   On: 03/27/2018 13:58   Dg Chest Port 1 View  Result Date: 03/25/2018 CLINICAL DATA:  Initial evaluation for endotracheal tube placement EXAM: PORTABLE CHEST 1 VIEW COMPARISON:  Prior radiograph from earlier the same day. FINDINGS: Tip of endotracheal tube positioned approximately 3.5 cm above the carina. Enteric tube courses into the abdomen. Left-sided pacemaker/AICD noted. Cardiac and mediastinal silhouettes are stable, and remain within normal limits. Lungs remain hypoinflated. Chronic interstitial changes with superimposed mild bibasilar atelectasis and/or infiltrates again noted,, perhaps mildly improved. No large pleural effusion. No pneumothorax. No acute osseous abnormality. IMPRESSION: 1. Tip of the endotracheal tube 3.5 cm above the carina. 2. Shallow lung inflation with chronic interstitial changes. Superimposed bibasilar opacities could reflect atelectasis and/or infiltrates, slightly improved from prior radiograph earlier today. Electronically Signed   By: Jeannine Boga M.D.   On: 03/25/2018 19:03   Portable Chest X-ray  Result  Date: 03/25/2018 CLINICAL DATA:  Intubation. EXAM: PORTABLE CHEST 1 VIEW COMPARISON:  Twelve 05/2017.  02/18/2018. FINDINGS: Endotracheal tube tip 3 cm above the carina. NG tube tip in the stomach. Heart size normal. Diffuse bilateral pulmonary interstitial prominence noted. Interstitial changes are stable and most likely represent chronic interstitial lung disease. Mild  bibasilar atelectasis/infiltrates. No pleural effusion or pneumothorax. IMPRESSION: 1. Endotracheal tube tip noted 3 cm above the carina. NG tube tip noted in stomach. 3.  AICD in stable position.  Heart size stable. 4. Stable chronic interstitial changes. Mild bibasilar atelectasis/infiltrates again noted without interim change. Electronically Signed   By: Marcello Moores  Register   On: 03/25/2018 10:13   Dg Chest Port 1 View  Result Date: 03/24/2018 CLINICAL DATA:  Hypoxia EXAM: PORTABLE CHEST 1 VIEW COMPARISON:  Chest radiograph 03/23/2018 FINDINGS: Multi lead AICD device overlies the left hemithorax. Leads are stable in position. Interval extubation and removal of enteric tube. Stable cardiomegaly. Interval increase in patchy consolidation within the left mid lower lung. Scattered coarse interstitial opacities are similar when compared to prior. IMPRESSION: Increased patchy consolidation left lower lung may represent pneumonia in the appropriate clinical setting. Followup PA and lateral chest X-ray is recommended in 3-4 weeks following trial of antibiotic therapy to ensure resolution and exclude underlying malignancy. Coarse interstitial opacities bilaterally may represent edema superimposed upon chronic interstitial process. Electronically Signed   By: Lovey Newcomer M.D.   On: 03/24/2018 21:43    Labs:  CBC: Recent Labs    03/25/18 1648 03/26/18 0423 03/27/18 0308 03/28/18 0529  WBC 13.9* 13.3* 12.8* 9.8  HGB 11.2* 10.9* 10.4* 10.4*  HCT 35.5* 35.0* 33.1* 32.8*  PLT 203 183 174 170    COAGS: Recent Labs    03/23/18 1831 03/25/18 0632  INR 1.16 1.07  APTT 27 31    BMP: Recent Labs    03/25/18 1648 03/26/18 0423 03/27/18 0308 03/28/18 0529  NA 140 139 139 141  K 3.6 4.0 3.5 3.6  CL 107 104 105 106  CO2 25 22 24 22   GLUCOSE 113* 233* 236* 250*  BUN 15 17 23  25*  CALCIUM 8.5* 8.7* 8.7* 8.4*  CREATININE 1.00 0.87 0.66 0.68  GFRNONAA >60 >60 >60 >60  GFRAA >60 >60 >60 >60     LIVER FUNCTION TESTS: Recent Labs    05/28/17 0951 11/26/17 0918 03/23/18 1831  BILITOT 0.5 0.5 1.2  AST 26 21 48*  ALT 27 20 28   ALKPHOS 42 46 33*  PROT 7.4 7.6 6.2*  ALBUMIN 4.2 4.1 3.1*    Assessment and Plan:  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 Estanislado Pandy. Patient's conditionstable- remains intubated (plan for possible extubation today per CCM) and still moving all 4s. Right groin incision stable. Appreciate and agree with neurology and CCM management. Please call IR with questions/concerns.   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

## 2018-03-28 NOTE — Progress Notes (Signed)
NAME:  Terry Peck, MRN:  [id number], DOB:  Oct 12, 1940, LOS: 5 ADMISSION DATE:  03/23/2018, CONSULTATION DATE:  03/23/2018 REFERRING MD:  Dr. 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 S/p tPA and reperfusion. PCCM consulted for ventilation management.  Past Medical History  Pulmonary fibrosis, HTN, DM, chronic diastolic heart failure   Significant Hospital Events   12/1 > Admitted for weakness and aphasia. Found with left ICA and Left M2 occlusion s/p tPA and revascularization 12/2 > Extubated without complication 43/3 > Worsening respiratory status requiring re-intubation. Patient self-extubated and required emergent re-intubation Consults:  Neurology 12/1 PCCM 12/1  Urology 12/3  Procedures:  --ETT 12/1 >> 12/2 --Reintubated 12/3>>Self-extubated on same day>>Reintubated for hypoxemia   Significant Diagnostic Tests:  CT Head 12/1 > 1. Hyperdense terminal left internal carotid artery and left MCA compatible with thrombosis. No evidence of acute infarct or Hemorrhage Atrophy and chronic microvascular ischemic changes in the white matter. CTA Head/Neck 12/1 > 1. 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 Reconstitution of the supraclinoid left internal carotid artery through the anterior communicating artery. There is clot in the left M1 and M2 segments with decreased blood flow. There is occlusion of the posterior division of the left middle cerebral artery due to Clot. 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 Moderate stenosis origin of left vertebral artery Probable fetal origin left posterior cerebral artery which is supplied from a small hypoplastic left P1 segment.  Micro Data:  U/A 12/1 >> Tracheal Asp 12/1 >>  Antimicrobials:  N/A    Interim  history/subjective:  No events overnight.  Levophed weaned off.  Tolerating mechanical ventilation.  Denies pain.  We will plan for SBT.  If patient stable for extubation, will discuss with family if we wish to reintubate.  Objective   Blood pressure 113/60, pulse 60, temperature 97.7 F (36.5 C), temperature source Axillary, resp. rate 15, height 5\' 7"  (1.702 m), weight 84.2 kg, SpO2 99 %.    Vent Mode: PRVC FiO2 (%):  [40 %-50 %] 40 % Set Rate:  [15 bmp] 15 bmp Vt Set:  [520 mL] 520 mL PEEP:  [5 cmH20] 5 cmH20 Plateau Pressure:  [13 cmH20] 13 cmH20   Intake/Output Summary (Last 24 hours) at 03/28/2018 2951 Last data filed at 03/28/2018 0700 Gross per 24 hour  Intake 2744.3 ml  Output 1330 ml  Net 1414.3 ml   Filed Weights   03/26/18 0500 03/27/18 0500 03/28/18 0500  Weight: 74.1 kg 74 kg 84.2 kg    Examination: General: Chronically ill-appearing male, laying in bed, on ventilator HEENT NT: [place], AT, ETT in place Eyes: EOMI, no scleral icterus PULM: Very faint bibasilar crackles, no wheezing or rhonchi CV: RRR, no MGR Abdomen BS+, soft, NTTP Extremities: No edema, no cyanosis Neuro: Awake and alert, follows commands, moves extremities x4 GU: Foley in place  Resolved Hospital Problem list   Hyperkalemia Hematuria secondary to traumatic Foley in setting of TPA  Assessment & Plan:   #Acute on chronic hypoxemic respiratory failure #Pulmonary Fibrosis Exacerbation #Suspected pulmonary edema #Hx of Aspiration  Dr. Lenna Gilford is his pulmonologist. On 2 L via [place] at home. Recent TTE suggestive of PH. 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 Plan  - SBT today.  Plan to extubate if stable -Continue nebulizers: Pulmicort and  Brovana BID, PRN albuterol - Continue Solu-Medrol l x 3d.  Transition to p.o. prednisone tomorrow - Continue levofloxacin X 5 days for coverage of community-acquired and atypical pneumonia - Continue PPI -If pressures remain stable, will resume p.o. diuresis  tomorrow  #Hypotension-improving Requiring minimal pressor support. Likely secondary to sedation.  Low suspicion for infection - Levophed currently weaned off.  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 BP goal <140 - Continue ASA - PT/OT-recommends inpatient rehab on discharge  #HTN #S/P Bi-V pacer/ICD, Followed by [facility]  #H/O G1 Diastolic HF, HLD Plan  - Telemetry - BP goal less than 140 post procedure per neurology - Continue home Lipitor  - Continue Coreg -Hold home Lasix, Cozaar in setting of borderline pressures  DM Plan  -Trend Glucose -SSI   Best practice:  Diet: NPO Pain/Anxiety/Delirium protocol VAP protocol DVT prophylaxis: SCD GI prophylaxis: PPI Glucose control: SSI Mobility: PT/OT Code Status: FC Family Communication: Family involved but not at bedside this a.m. Disposition: Continue ICU  Labs   I reviewed the following labs myself:  CBC: Recent Labs  Lab 03/23/18 1831  03/24/18 0403 03/25/18 2831 03/25/18 1648 03/26/18 0423 03/27/18 0308 03/28/18 0529  WBC 7.8  --  9.7 13.5* 13.9* 13.3* 12.8* 9.8  NEUTROABS 4.8  --  8.9*  --   --   --   --   --   HGB 14.3   < > 12.4* 11.7* 11.2* 10.9* 10.4* 10.4*  HCT 45.2   < > 41.0 36.7* 35.5* 35.0* 33.1* 32.8*  MCV 94.0  --  94.9 92.2 93.2 92.6 91.4 92.1  PLT 208  --  232 203 203 183 174 170   < > = values in this interval not displayed.    Basic Metabolic Panel: Recent Labs  Lab 03/24/18 0403 03/25/18 0632 03/25/18 1648 03/26/18 0423 03/26/18 1320 03/26/18 1639 03/27/18 0308 03/27/18 1659 03/28/18 0529  NA 138 138 140 139  --   --  139  --  141  K 4.2 3.9 3.6 4.0  --   --  3.5  --  3.6  CL 108 108 107 104  --   --  105  --  106  CO2 20* 22 25 22   --   --  24  --  22  GLUCOSE 195* 149* 113* 233*  --   --  236*  --  250*  BUN  23 16 15 17   --   --  23  --  25*  CREATININE 0.90 0.90 1.00 0.87  --   --  0.66  --  0.68  CALCIUM 8.3* 8.6* 8.5* 8.7*  --   --  8.7*  --  8.4*  MG 2.0  --   --   --  1.9 2.0 2.1 2.2  --   PHOS 3.4  --   --   --  2.2* 2.1* 1.7* 2.5  --    GFR: Estimated Creatinine Clearance: 80.2 mL/min (by C-G formula based on SCr of 0.68 mg/dL). Recent Labs  Lab 03/23/18 2230  03/25/18 1648 03/26/18 0423 03/27/18 0308 03/28/18 0529  WBC  --    < > 13.9* 13.3* 12.8* 9.8  LATICACIDVEN 1.0  --   --   --   --   --    < > = values  in this interval not displayed.    ABG    Component Value Date/Time   PHART 7.403 03/26/2018 0407   PCO2ART 38.3 03/26/2018 0407   PO2ART 73.0 (L) 03/26/2018 0407   HCO3 23.9 03/26/2018 0407   TCO2 25 03/26/2018 0407   ACIDBASEDEF 1.0 03/26/2018 0407   O2SAT 95.0 03/26/2018 0407    CBG: Recent Labs  Lab 03/27/18 1140 03/27/18 1549 03/27/18 1947 03/27/18 2325 03/28/18 0324  GLUCAP 202* 231* 238* 203* 210*   I reviewed the following imaging myself: CT Head 12/2 - Left MCA infarction CXR 03/24/18 - Chronic interstitial changes, left lower lobe opacity that may represent pneumonia however will need clinical correlation CXR 12/3 - ETT appropriately in place, chronic bilateral interstitial changes TTE 12/3 EF 65-70%. PA peak pressure:54 mmHg  Critical care time: 33 minutes     This patient is critically ill due to above medical issues. This patient's care requires constant monitoring of vital signs, hemodynamics, respiratory and cardiac monitoring, review of multiple databases, discussion with family, other specialists and medical decision making of high complexity. I spent 33 minutes of critical care time in the care of this patient. This critical care time does not reflect procedure time, or teaching time or supervisory time of PA/NP/Med student/Med Resident etc but could involve care discussion time.  Rodman Pickle, M.D. [facility] Pulmonary/Critical Care  Medicine Pager: [phone] After hours pager: [phone]

## 2018-03-28 NOTE — Progress Notes (Signed)
eLink Physician-Brief Progress Note Patient Name: Terry Peck DOB: 09-Oct-1940 MRN: [id number]   Date of Service  03/28/2018  HPI/Events of Note  Pt had a hypoglycemic episode earlier this PM and required 1/2 amp of D 24 W. Pt now due for a scheduled dose of 15 units of Lantus and blood sugar is 156 mg %, Pt is NPO due to BIPAP. Pt scheduled for a dose of Coreg but is on BIPAP and desaturates down to the low 80's when mask is removed briefly.  eICU Interventions  Hold Lantus and recheck blood sugar at next scheduled interval, hold Coreg dose for tonight.        Kerry Kass Kamorie Aldous 03/28/2018, 9:55 PM

## 2018-03-28 NOTE — Progress Notes (Signed)
STROKE TEAM PROGRESS NOTE   SUBJECTIVE (INTERVAL HISTORY) His RN is at the bedside. Pt was on weaning trials now and plan to extubate today. Off levophed and propofol. Awake, still intubated.    OBJECTIVE Temp:  [97.6 F (36.4 C)-98.8 F (37.1 C)] 97.6 F (36.4 C) (12/06 0800) Pulse Rate:  [59-84] 61 (12/06 0900) Cardiac Rhythm: Ventricular paced (12/06 0400) Resp:  [14-28] 19 (12/06 0900) BP: (82-144)/(47-75) 121/65 (12/06 0900) SpO2:  [91 %-100 %] 100 % (12/06 0900) FiO2 (%):  [40 %-50 %] 40 % (12/06 0722) Weight:  [84.2 kg] 84.2 kg (12/06 0500)  Recent Labs  Lab 03/27/18 1140 03/27/18 1549 03/27/18 1947 03/27/18 2325 03/28/18 0324  GLUCAP 202* 231* 238* 203* 210*   Recent Labs  Lab 03/24/18 0403 03/25/18 6948 03/25/18 1648 03/26/18 0423 03/26/18 1320 03/26/18 1639 03/27/18 0308 03/27/18 1659 03/28/18 0529  NA 138 138 140 139  --   --  139  --  141  K 4.2 3.9 3.6 4.0  --   --  3.5  --  3.6  CL 108 108 107 104  --   --  105  --  106  CO2 20* 22 25 22   --   --  24  --  22  GLUCOSE 195* 149* 113* 233*  --   --  236*  --  250*  BUN 23 16 15 17   --   --  23  --  25*  CREATININE 0.90 0.90 1.00 0.87  --   --  0.66  --  0.68  CALCIUM 8.3* 8.6* 8.5* 8.7*  --   --  8.7*  --  8.4*  MG 2.0  --   --   --  1.9 2.0 2.1 2.2  --   PHOS 3.4  --   --   --  2.2* 2.1* 1.7* 2.5  --    Recent Labs  Lab 03/23/18 1831  AST 48*  ALT 28  ALKPHOS 33*  BILITOT 1.2  PROT 6.2*  ALBUMIN 3.1*   Recent Labs  Lab 03/23/18 1831  03/24/18 0403 03/25/18 0632 03/25/18 1648 03/26/18 0423 03/27/18 0308 03/28/18 0529  WBC 7.8  --  9.7 13.5* 13.9* 13.3* 12.8* 9.8  NEUTROABS 4.8  --  8.9*  --   --   --   --   --   HGB 14.3   < > 12.4* 11.7* 11.2* 10.9* 10.4* 10.4*  HCT 45.2   < > 41.0 36.7* 35.5* 35.0* 33.1* 32.8*  MCV 94.0  --  94.9 92.2 93.2 92.6 91.4 92.1  PLT 208  --  232 203 203 183 174 170   < > = values in this interval not displayed.   No results for input(s): CKTOTAL, CKMB,  CKMBINDEX, TROPONINI in the last 168 hours. No results for input(s): LABPROT, INR in the last 72 hours. No results for input(s): COLORURINE, LABSPEC, [place], GLUCOSEU, HGBUR, BILIRUBINUR, KETONESUR, PROTEINUR, UROBILINOGEN, NITRITE, LEUKOCYTESUR in the last 72 hours.  Invalid input(s): APPERANCEUR     Component Value Date/Time   CHOL 95 03/24/2018 0403   CHOL 104 07/17/2016 0814   TRIG 99 03/28/2018 0529   HDL 28 (L) 03/24/2018 0403   HDL 42 07/17/2016 0814   CHOLHDL 3.4 03/24/2018 0403   VLDL 20 03/24/2018 0403   LDLCALC 47 03/24/2018 0403   LDLCALC 42 07/17/2016 0814   Lab Results  Component Value Date   HGBA1C 7.0 (H) 03/24/2018   No results found for: LABOPIA, COCAINSCRNUR,  LABBENZ, AMPHETMU, THCU, LABBARB  No results for input(s): ETH in the last 168 hours.  I have personally reviewed the radiological images below and agree with the radiology interpretations.  Ct Angio Head W Or Wo Contrast  Result Date: 03/23/2018 CLINICAL DATA:  Right-sided weakness, aphasia EXAM: CT ANGIOGRAPHY HEAD AND NECK TECHNIQUE: Multidetector CT imaging of the head and neck was performed using the standard protocol during bolus administration of intravenous contrast. Multiplanar CT image reconstructions and MIPs were obtained to evaluate the vascular anatomy. Carotid stenosis measurements (when applicable) are obtained utilizing NASCET criteria, using the distal internal carotid diameter as the denominator. CONTRAST:  15mL ISOVUE-370 IOPAMIDOL (ISOVUE-370) INJECTION 76% COMPARISON:  CT head 03/23/2018 FINDINGS: CTA NECK FINDINGS Aortic arch: Atherosclerotic aortic arch without aneurysm. Mild atherosclerotic disease and tortuosity in the proximal great vessels without significant stenosis. Right carotid system: Scattered atherosclerotic disease in the right common carotid artery. Atherosclerotic calcified plaque in the right carotid bifurcation. 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  Left carotid system: Scattered atherosclerotic disease left common carotid artery without significant stenosis. Atherosclerotic calcification left carotid bifurcation. There is decreased flow in the left common carotid artery due to acute occlusion of the proximal left internal carotid artery with thrombus present. There is no contrast distal to the proximal left internal carotid artery which is thrombosed. Left internal carotid artery is occluded to the supraclinoid segment. Vertebral arteries: Left vertebral artery dominant with moderate stenosis at the origin. Right vertebral non dominant without significant stenosis. Skeleton: Cervical spondylosis.  No acute skeletal abnormality. Other neck: Negative for mass or adenopathy in the neck. Upper chest: Transvenous pacemaker left. Interstitial fibrosis of the lungs bilaterally. No pleural effusion. Review of the MIP images confirms the above findings CTA HEAD FINDINGS Anterior circulation: Left internal carotid artery is occluded through the cavernous segment with reconstitution of the supraclinoid segment via flow from the anterior communicating artery. There is clot throughout the left M1 segment with slow flow. There is a small amount of flow in left M2 branches with occlusion of the posterior division of left M2. Both anterior cerebral arteries are patent. Right middle cerebral artery patent without stenosis. Atherosclerotic plaque in the right cavernous carotid with mild stenosis. Posterior circulation: Both vertebral arteries patent to the basilar. Left vertebral dominant. Right PICA patent. Left PICA not visualized. AICA patent bilaterally. Basilar widely patent. Superior cerebellar artery patent. Right posterior cerebral artery patent. Probable fetal origin of the left posterior cerebral artery with supply from a hypoplastic left P1 segment. Venous sinuses: Patent Anatomic variants: None Delayed phase: Not performed Review of the MIP images confirms the above  findings IMPRESSION: 1. 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 Reconstitution of the supraclinoid left internal carotid artery through the anterior communicating artery. There is clot in the left M1 and M2 segments with decreased blood flow. There is occlusion of the posterior division of the left middle cerebral artery due to clot. 2. 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 3. Moderate stenosis origin of left vertebral artery 4. Probable fetal origin left posterior cerebral artery which is supplied from a small hypoplastic left P1 segment. 5. These results were called by telephone at the time of interpretation on 03/23/2018 at 6:33 pm to Dr. Leonel Ramsay , who verbally acknowledged these results. Electronically Signed   By: Franchot Gallo M.D.   On: 03/23/2018 18:54   Ct Angio Neck W Or Wo Contrast  Result Date: 03/23/2018 CLINICAL DATA:  Right-sided weakness,  aphasia EXAM: CT ANGIOGRAPHY HEAD AND NECK TECHNIQUE: Multidetector CT imaging of the head and neck was performed using the standard protocol during bolus administration of intravenous contrast. Multiplanar CT image reconstructions and MIPs were obtained to evaluate the vascular anatomy. Carotid stenosis measurements (when applicable) are obtained utilizing NASCET criteria, using the distal internal carotid diameter as the denominator. CONTRAST:  17mL ISOVUE-370 IOPAMIDOL (ISOVUE-370) INJECTION 76% COMPARISON:  CT head 03/23/2018 FINDINGS: CTA NECK FINDINGS Aortic arch: Atherosclerotic aortic arch without aneurysm. Mild atherosclerotic disease and tortuosity in the proximal great vessels without significant stenosis. Right carotid system: Scattered atherosclerotic disease in the right common carotid artery. Atherosclerotic calcified plaque in the right carotid bifurcation. 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 Left carotid system: Scattered atherosclerotic disease left common carotid artery  without significant stenosis. Atherosclerotic calcification left carotid bifurcation. There is decreased flow in the left common carotid artery due to acute occlusion of the proximal left internal carotid artery with thrombus present. There is no contrast distal to the proximal left internal carotid artery which is thrombosed. Left internal carotid artery is occluded to the supraclinoid segment. Vertebral arteries: Left vertebral artery dominant with moderate stenosis at the origin. Right vertebral non dominant without significant stenosis. Skeleton: Cervical spondylosis.  No acute skeletal abnormality. Other neck: Negative for mass or adenopathy in the neck. Upper chest: Transvenous pacemaker left. Interstitial fibrosis of the lungs bilaterally. No pleural effusion. Review of the MIP images confirms the above findings CTA HEAD FINDINGS Anterior circulation: Left internal carotid artery is occluded through the cavernous segment with reconstitution of the supraclinoid segment via flow from the anterior communicating artery. There is clot throughout the left M1 segment with slow flow. There is a small amount of flow in left M2 branches with occlusion of the posterior division of left M2. Both anterior cerebral arteries are patent. Right middle cerebral artery patent without stenosis. Atherosclerotic plaque in the right cavernous carotid with mild stenosis. Posterior circulation: Both vertebral arteries patent to the basilar. Left vertebral dominant. Right PICA patent. Left PICA not visualized. AICA patent bilaterally. Basilar widely patent. Superior cerebellar artery patent. Right posterior cerebral artery patent. Probable fetal origin of the left posterior cerebral artery with supply from a hypoplastic left P1 segment. Venous sinuses: Patent Anatomic variants: None Delayed phase: Not performed Review of the MIP images confirms the above findings IMPRESSION: 1. Acute occlusion left internal carotid artery at the  origin. Reconstitution of the supraclinoid left internal carotid artery through the anterior communicating artery. There is clot in the left M1 and M2 segments with decreased blood flow. There is occlusion of the posterior division of the left middle cerebral artery due to clot. 2. 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 3. Moderate stenosis origin of left vertebral artery 4. Probable fetal origin left posterior cerebral artery which is supplied from a small hypoplastic left P1 segment. 5. These results were called by telephone at the time of interpretation on 03/23/2018 at 6:33 pm to Dr. Leonel Ramsay , who verbally acknowledged these results. Electronically Signed   By: Franchot Gallo M.D.   On: 03/23/2018 18:54   Dg Chest Port 1 View  Result Date: 03/23/2018 CLINICAL DATA:  Post intubation and orogastric tube placement. EXAM: PORTABLE CHEST 1 VIEW COMPARISON:  02/18/2018 FINDINGS: Endotracheal tube has tip 2.9 cm above the carina. Placement of enteric tube with tip over the stomach in the left upper quadrant. Left-sided pacemaker unchanged. Lungs are hypoinflated demonstrate stable chronic bilateral interstitial disease. Mild  prominence of the perihilar markings suggesting mild degree of vascular congestion cardiomediastinal silhouette and remainder of the exam is unchanged. IMPRESSION: Suggestion mild vascular congestion.  Chronic interstitial disease. Tubes and lines as described. Electronically Signed   By: Marin Olp M.D.   On: 03/23/2018 21:56   Ct Head Code Stroke Wo Contrast  Result Date: 03/23/2018 CLINICAL DATA:  Code stroke.  Right-sided weakness, aphasia EXAM: CT HEAD WITHOUT CONTRAST TECHNIQUE: Contiguous axial images were obtained from the base of the skull through the vertex without intravenous contrast. COMPARISON:  None. FINDINGS: Brain: Moderate atrophy. Patchy hypodensity in the cerebral white matter bilaterally compatible with mild chronic microvascular ischemia.  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 Negative for acute hemorrhage.  No acute infarct or mass. Vascular: Hyperdense left MCA. Hyperdense terminal left internal carotid artery. Atherosclerotic calcification cavernous carotid bilaterally. Skull: Negative Sinuses/Orbits: Paranasal sinuses clear. Bilateral cataract surgery. Other: None ASPECTS ([place] Stroke Program Early CT Score) - Ganglionic level infarction (caudate, lentiform nuclei, internal capsule, insula, M1-M3 cortex): 7 - Supraganglionic infarction (M4-M6 cortex): 3 Total score (0-10 with 10 being normal): 10 IMPRESSION: 1. Hyperdense terminal left internal carotid artery and left MCA compatible with thrombosis. No evidence of acute infarct or hemorrhage 2. Atrophy and chronic microvascular ischemic changes in the white matter. 3. ASPECTS is 10 4. These results were called by telephone at the time of interpretation on 03/23/2018 at 6:33 pm to Dr. Leonel Ramsay, who verbally acknowledged these results. Electronically Signed   By: Franchot Gallo M.D.   On: 03/23/2018 18:35   TTE - Full study not performed as pt requested study to be stopped   prior to completion; only partial parasternal and no   apical/subcostal images obtained; vigorous LV systolic function;   doppler incomplete.  Ct Head Wo Contrast Result Date: 03/24/2018 CLINICAL DATA:  77 y/o M; left ICA occlusion post intra-arterial intervention. EXAM: CT HEAD WITHOUT CONTRAST TECHNIQUE: Contiguous axial images were obtained from the base of the skull through the vertex without intravenous contrast. COMPARISON:  03/23/2018 CT head, CTA head, and cerebral angiogram. FINDINGS: Brain: Cortical hypoattenuation with loss of gray-white differentiation and mild edema of the left lateral temporal lobe, temporal operculum, insula left parietal lobe, and the lateral aspect of the left occipital lobe compatible with late acute/early subacute infarction. No acute  hemorrhage. No extra-axial collection, hydrocephalus, midline shift, or herniation. No additional area of acute stroke. Stable background of chronic microvascular ischemic changes and volume loss of the brain. 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 Vascular: Calcific atherosclerosis of carotid siphons and vertebral arteries. No hyperdense vessel identified. Skull: Normal. Negative for fracture or focal lesion. Sinuses/Orbits: No acute finding. Other: None. IMPRESSION: 1. 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 No hemorrhage or significant mass effect. 2. Stable background of chronic microvascular ischemic changes and volume loss of the brain. 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 These results will be called to the ordering clinician or representative by the Radiologist Assistant, and communication documented in the PACS or zVision Dashboard. Electronically Signed   By: Kristine Garbe M.D.   On: 03/24/2018 20:38    PHYSICAL EXAM  Temp:  [97.6 F (36.4 C)-98.8 F (37.1 C)] 97.6 F (36.4 C) (12/06 0800) Pulse Rate:  [59-84] 61 (12/06 0900) Resp:  [14-28] 19 (12/06 0900) BP: (82-144)/(47-75) 121/65 (12/06 0900) SpO2:  [91 %-100 %] 100 % (12/06 0900)  FiO2 (%):  [40 %-50 %] 40 % (12/06 0722) Weight:  [84.2 kg] 84.2 kg (12/06 0500)  General - Well nourished, well developed, intubated off sedation and pressor.  Ophthalmologic - fundi not visualized due to noncooperation.  Cardiovascular - Regular rate and rhythm.  Neuro - intubated off sedation and pressor, on weaning trial, eyes open but not following specific commands except closed eyes on command. Eyes move to both directions, no gaze preference. Pupil 51mm bilaterally, reactive to light, not consistently blinking to visual threat bilaterally but attending objects to both sides.  Bilateral corneal and cough reflex  present.  Facial symmetry not able to test due to ET tube, did not following commands of tongue protrusion. Moving all extremities symmetrically, BUE 3/5 and BLE proximal 2/5 and distal 3/5. DTR 1+ and no babinski bilaterally. Sensation, coordination and gait not tested.  No significant change of neuro exam from yesterday.  ASSESSMENT/PLAN [name] is a 77 y.o. male with history of CHF on pacer, pulmonary fibrosis, HTN, HLD admitted for right side weakness and aphasia. TPA given. 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 Large vessel athro  Resultant intubated on sedation, right hemiplegia, left gaze, aphasia  CT head left MCA hyperdense sign, b/l hygroma   CTA head and neck - left ICA and MCA occlusion, left VA origin athero, left fetal PCA patent distally  MRI / MRA not able to perform due to pacemaker  CT repeat left MCA infarct  DSA left ICA and MCA occlusion s/p TICI 3 reperfusion  2D Echo EF 65-70%  Pacemaker interrogation no afib  LDL 47  HgbA1c 7.0  SCDs for VTE prophylaxis  NPO  aspirin 81 mg daily prior to admission, now on ASA 325mg .  Ongoing aggressive stroke risk factor management  Therapy recommendations:  pending  Disposition:  Pending  Respiratory failure with pulmonary fibrosis  Following with pulmonary for PF as outpt  Intubated - extubated- reintubated - self extubated - re-intubated  CCM on board  On weaning trial now  Plan for extubate today  On Levaquin for aspiration prevention  CHF with pacer  Followed Dr. Rayann Heman  EF 60-65% in 08/2016  TTE this time EF 65-70%  Pacer interrogation no afib  Diabetes  HgbA1c 7.0 goal < 7.0  Controlled  Hyperglycemia continued with TF  Home meds - lantus 50 U daily  Added lantus 15U bid and novolog 3U q4h  CBG monitoring  SSI   DM  coordinator on board, appreciate recs  Hypotension Hx of hypertension . Stable at the low end . Off levophed for now . On Coreg 12.5 - continue  Long term BP goal normotensive  Hyperlipidemia  Home meds:  lipitor 20   LDL 47, goal < 70  Now on lipitor 20  Continue statin at discharge  Gross hematuria, resolved  Likely due to uretal trauma  Difficulty foley insertion - urology put in catheter  Hematuria resolved   On ASA 325mg   Continue monitoring  Other Stroke Risk Factors  Advanced age  Other Active Problems  Hyperkalemia - K+ 5.8->4.2-> 3.9->4.0->3.5->3.6  Leukocytosis WBC 9.7->13.5->13.3->12.8->9.8  Hospital day # 5  This patient is critically ill due to acute stroke, s/p tPA and intervention, pulmonary fibrosis, respiratory failure and at significant risk of neurological worsening, death form recurrent stroke, hemorrhagic conversion, heart failure, seizure. This patient's  care requires constant monitoring of vital signs, hemodynamics, respiratory and cardiac monitoring, review of multiple databases, neurological assessment, discussion with family, other specialists and medical decision making of high complexity. I spent 35 minutes of neurocritical care time in the care of this patient.   Rosalin Hawking, MD PhD Stroke Neurology 03/28/2018 10:44 AM    To contact Stroke Continuity provider, please refer to [email or web address] After hours, contact General Neurology

## 2018-03-28 NOTE — Procedures (Signed)
Extubation Procedure Note  Patient Details:   Name: Terry Peck DOB: [birth year] MRN: [id number]   Airway Documentation:    Vent end date: (P) 03/28/18 Vent end time: (P) 1208   Evaluation  O2 sats: transiently fell during during procedure and currently acceptable Complications: No apparent complications Patient did tolerate procedure well. Bilateral Breath Sounds: Rhonchi, Diminished   Yes   Pt extubated to Bipap NIV PC 5/5 R 15 40% per MD order. Pt spo2 immediately dropped to 71% following extubation. Pt with increased WOB, appears in respiratory distress. Pt fighting bipap but encouraged to relax and to let the bipap breathe for him. VS within normal limits at this time. RN at bedside. RT will continue to closely monitor pt  Jesse Sans 03/28/2018, 12:39 PM

## 2018-03-29 LAB — CBC
HEMATOCRIT: 32.6 % — AB (ref 39.0–52.0)
Hemoglobin: 10.4 g/dL — ABNORMAL LOW (ref 13.0–17.0)
MCH: 29.3 pg (ref 26.0–34.0)
MCHC: 31.9 g/dL (ref 30.0–36.0)
MCV: 91.8 fL (ref 80.0–100.0)
Platelets: 190 10*3/uL (ref 150–400)
RBC: 3.55 MIL/uL — ABNORMAL LOW (ref 4.22–5.81)
RDW: 15.1 % (ref 11.5–15.5)
WBC: 9.8 10*3/uL (ref 4.0–10.5)
nRBC: 0 % (ref 0.0–0.2)

## 2018-03-29 LAB — BASIC METABOLIC PANEL
Anion gap: 7 (ref 5–15)
BUN: 26 mg/dL — ABNORMAL HIGH (ref 8–23)
CO2: 28 mmol/L (ref 22–32)
Calcium: 8.4 mg/dL — ABNORMAL LOW (ref 8.9–10.3)
Chloride: 110 mmol/L (ref 98–111)
Creatinine, Ser: 0.85 mg/dL (ref 0.61–1.24)
GFR calc Af Amer: >60 mL/min (ref >60)
GFR calc non Af Amer: >60 mL/min (ref >60)
Glucose, Bld: 115 mg/dL — ABNORMAL HIGH (ref 70–99)
Potassium: 3.5 mmol/L (ref 3.5–5.1)
Sodium: 145 mmol/L (ref 135–145)

## 2018-03-29 LAB — GLUCOSE, CAPILLARY
GLUCOSE-CAPILLARY: 101 mg/dL — AB (ref 70–99)
Glucose-Capillary: 113 mg/dL — ABNORMAL HIGH (ref 70–99)
Glucose-Capillary: 113 mg/dL — ABNORMAL HIGH (ref 70–99)
Glucose-Capillary: 142 mg/dL — ABNORMAL HIGH (ref 70–99)
Glucose-Capillary: 156 mg/dL — ABNORMAL HIGH (ref 70–99)
Glucose-Capillary: 161 mg/dL — ABNORMAL HIGH (ref 70–99)

## 2018-03-29 MED ORDER — ENOXAPARIN SODIUM 30 MG/0.3ML ~~LOC~~ SOLN
30.0000 mg | SUBCUTANEOUS | Status: DC
  Administered 2018-03-29 – 2018-03-31 (×3): 30 mg via SUBCUTANEOUS
  Filled 2018-03-29 (×3): qty 0.3

## 2018-03-29 MED ORDER — FUROSEMIDE 10 MG/ML IJ SOLN
40.0000 mg | Freq: Once | INTRAMUSCULAR | Status: AC
  Administered 2018-03-29: 40 mg via INTRAVENOUS
  Filled 2018-03-29: qty 4

## 2018-03-29 NOTE — Evaluation (Signed)
Clinical/Bedside Swallow Evaluation Patient Details  Name: Terry Peck MRN: [id number] Date of Birth: 1941/04/18  Today's Date: 03/29/2018 Time: SLP Start Time (ACUTE ONLY): 1520 SLP Stop Time (ACUTE ONLY): 1555 SLP Time Calculation (min) (ACUTE ONLY): 35 min  Past Medical History:  Past Medical History:  Diagnosis Date  . Abnormal ANCA test 07/03/2011  . Allergy   . Arthritis    "mild; right shoulder" (01/29/2012)  . B12 deficiency anemia   . Barrett's esophagus 11/23/2010  . Bundle branch block, left   . Cataract    L eye surgery  . CHF (congestive heart failure) ([place])    pacemaker  . Chronic systolic dysfunction of left ventricle    EF 30%  . Colon polyps 11/23/2010  . GERD (gastroesophageal reflux disease)   . Hyperlipidemia 10/14/2012  . Hypertension   . ICD (implantable cardiac defibrillator) in place   . Iron deficiency anemia 05/30/2011  . Liver abscess 1980's  . Nonischemic cardiomyopathy ([place])    s/p [employer] Jude BiV ICD 01/29/12  . Pulmonary fibrosis ([employer])   . Pulmonary nodules   . Shortness of breath    "related to heart being out of rhythm" (01/29/2012)  . Type II diabetes mellitus ([place])    Past Surgical History:  Past Surgical History:  Procedure Laterality Date  . abscess drained  ?1980's   liver  . BI-VENTRICULAR IMPLANTABLE CARDIOVERTER DEFIBRILLATOR N/A 01/29/2012   Procedure: BI-VENTRICULAR IMPLANTABLE CARDIOVERTER DEFIBRILLATOR  (CRT-D);  Surgeon: Thompson Grayer, MD;  Location: [facility] C[facility] LAB;  Service: Cardiovascular;  Laterality: N/A;  . CARDIAC DEFIBRILLATOR PLACEMENT  01/29/2012   SJM Quadra Assura BiV ICD implanted by Dr Rayann Heman  . CATARACT EXTRACTION Left   . COLONOSCOPY    . IR CT HEAD LTD  03/23/2018  . IR PERCUTANEOUS ART THROMBECTOMY/INFUSION INTRACRANIAL INC DIAG ANGIO  03/23/2018  . RADIOLOGY WITH ANESTHESIA N/A 03/23/2018   Procedure: RADIOLOGY WITH ANESTHESIA;  Surgeon: Luanne Bras, MD;  Location: [facility];  Service: Radiology;  Laterality: N/A;   . TONSILLECTOMY  1952  . UPPER GI ENDOSCOPY     HPI:  Terry Peck is an 77 y.o. male HTN, DM, pulmonary fibrosis, HLD, CHF who presented to [facility] ED as a code stroke with c/o of right facial droop, right sided weakness and aphasia. Patient was intubated for airway protection on 12/1. Extubated 12/2. Reintubated 12/3 due to respiratory decompensation. Self extubated 12/3 and emergently reintubated. Extubated 12/6 to Bipap, family changed status to DNI.    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 Patient's oral motor exam revealed a hoarse voice with low volume and expressive aphasia. Patient able to follow simple commands for swallow evaluation. Oral exam revealed right sided facial droop, reduced strength and sensation of tongue and lip on right side. Pt had no anterior loss of bolus of any consistency, but exhibited delayed cough with thin liquid by tsp and cup. He tolerated puree by tsp with no clearing or coughing or change in vocal quality. Recommend NPO with an instrumental evaluation as soon as his oxygen needs allow. He is currenlty requiring HHFNC 50% at 20L and cannot travel to radiology on this amount of O2. If his O2 needs don't reduce by Sunday, he would be a good candidate for a FEES on Monday. SLP Visit Diagnosis: Dysphagia, unspecified (R13.10)  Aspiration Risk       Diet Recommendation NPO        Other  Recommendations Oral Care Recommendations: Oral care QID   Follow up Recommendations  Objective/instrumental testing       Swallow Study   General Date of Onset: 03/23/18 HPI: Terry Peck is an 77 y.o. male HTN, DM, pulmonary fibrosis, HLD, CHF who presented to [facility] ED as a code stroke with c/o of right facial droop, right sided weakness and aphasia. Patient was  intubated for airway protection on 12/1. Extubated 12/2. Reintubated 12/3 due to respiratory decompensation. Self extubated 12/3 and emergently reintubated. Extubated 12/6 to Bipap, family changed status to DNI.  Type of Study: Bedside Swallow Evaluation Previous Swallow Assessment: none Diet Prior to this Study: NPO Temperature Spikes Noted: No Respiratory Status: Nasal cannula(HHFNC) History of Recent Intubation: Yes Length of Intubations (days): 5 days(intubated a total of 3 times and self extubated 1X) Date extubated: 03/28/18 Behavior/Cognition: Alert;Cooperative;Pleasant mood(inconsistently follows commands) Oral Cavity Assessment: Within Functional Limits Oral Care Completed by SLP: Yes Oral Cavity - Dentition: Missing dentition Vision: Functional for self-feeding Self-Feeding Abilities: Needs assist Patient Positioning: Upright in bed Baseline Vocal Quality: Hoarse Volitional Cough: Strong Volitional Swallow: Unable to elicit    Oral/Motor/Sensory Function Overall Oral Motor/Sensory Function: Mild impairment Facial ROM: Reduced right Facial Symmetry: Abnormal symmetry right Facial Strength: Reduced right Facial Sensation: Reduced right Lingual ROM: Reduced right Lingual Symmetry: Abnormal symmetry right Lingual Strength: Reduced Lingual Sensation: Reduced   Ice Chips Ice chips: Within functional limits Presentation: Spoon   Thin Liquid Thin Liquid: Impaired Presentation: Spoon;Cup Oral Phase Impairments: Poor awareness of bolus;Reduced lingual movement/coordination Oral Phase Functional Implications: Prolonged oral transit Pharyngeal  Phase Impairments: Cough - Delayed    Nectar Thick Nectar Thick Liquid: Not tested   Honey Thick Honey Thick Liquid: Not tested   Puree Puree: Within functional limits Presentation: Spoon   Solid     Solid: Not tested      Charlynne Cousins Madelena Maturin, MA, CCC-SLP 03/29/2018 4:04 PM

## 2018-03-29 NOTE — Progress Notes (Addendum)
STROKE TEAM PROGRESS NOTE   SUBJECTIVE (INTERVAL HISTORY) No one is at the bedside. Pt is sitting up in bed and eating lunchl OBJECTIVE Temp:  [96.5 F (35.8 C)-99.3 F (37.4 C)] 98.8 F (37.1 C) (12/07 1119) Pulse Rate:  [59-69] 64 (12/07 1200) Cardiac Rhythm: Ventricular paced (12/07 0800) Resp:  [15-23] 23 (12/07 1200) BP: (109-135)/(57-73) 114/65 (12/07 1200) SpO2:  [93 %-100 %] 95 % (12/07 1408) FiO2 (%):  [40 %-50 %] 50 % (12/07 1408)  Recent Labs  Lab 03/28/18 1709 03/29/18 0000 03/29/18 0358 03/29/18 0808 03/29/18 1118  GLUCAP 152* 113* 101* 113* 161*   Recent Labs  Lab 03/24/18 0403  03/25/18 1648 03/26/18 0423 03/26/18 1320 03/26/18 1639 03/27/18 0308 03/27/18 1659 03/28/18 0529 03/29/18 0647  NA 138   < > 140 139  --   --  139  --  141 145  K 4.2   < > 3.6 4.0  --   --  3.5  --  3.6 3.5  CL 108   < > 107 104  --   --  105  --  106 110  CO2 20*   < > 25 22  --   --  24  --  22 28  GLUCOSE 195*   < > 113* 233*  --   --  236*  --  250* 115*  BUN 23   < > 15 17  --   --  23  --  25* 26*  CREATININE 0.90   < > 1.00 0.87  --   --  0.66  --  0.68 0.85  CALCIUM 8.3*   < > 8.5* 8.7*  --   --  8.7*  --  8.4* 8.4*  MG 2.0  --   --   --  1.9 2.0 2.1 2.2  --   --   PHOS 3.4  --   --   --  2.2* 2.1* 1.7* 2.5  --   --    < > = values in this interval not displayed.   Recent Labs  Lab 03/23/18 1831  AST 48*  ALT 28  ALKPHOS 33*  BILITOT 1.2  PROT 6.2*  ALBUMIN 3.1*   Recent Labs  Lab 03/23/18 1831  03/24/18 0403  03/25/18 1648 03/26/18 0423 03/27/18 0308 03/28/18 0529 03/29/18 0647  WBC 7.8  --  9.7   < > 13.9* 13.3* 12.8* 9.8 9.8  NEUTROABS 4.8  --  8.9*  --   --   --   --   --   --   HGB 14.3   < > 12.4*   < > 11.2* 10.9* 10.4* 10.4* 10.4*  HCT 45.2   < > 41.0   < > 35.5* 35.0* 33.1* 32.8* 32.6*  MCV 94.0  --  94.9   < > 93.2 92.6 91.4 92.1 91.8  PLT 208  --  232   < > 203 183 174 170 190   < > = values in this interval not displayed.   No  results for input(s): CKTOTAL, CKMB, CKMBINDEX, TROPONINI in the last 168 hours. No results for input(s): LABPROT, INR in the last 72 hours. No results for input(s): COLORURINE, LABSPEC, [place], GLUCOSEU, HGBUR, BILIRUBINUR, KETONESUR, PROTEINUR, UROBILINOGEN, NITRITE, LEUKOCYTESUR in the last 72 hours.  Invalid input(s): APPERANCEUR     Component Value Date/Time   CHOL 95 03/24/2018 0403   CHOL 104 07/17/2016 0814   TRIG 99 03/28/2018 0529   HDL 28 (L)  03/24/2018 0403   HDL 42 07/17/2016 0814   CHOLHDL 3.4 03/24/2018 0403   VLDL 20 03/24/2018 0403   LDLCALC 47 03/24/2018 0403   LDLCALC 42 07/17/2016 0814   Lab Results  Component Value Date   HGBA1C 7.0 (H) 03/24/2018   No results found for: LABOPIA, COCAINSCRNUR, LABBENZ, AMPHETMU, THCU, LABBARB  No results for input(s): ETH in the last 168 hours.  I have personally reviewed the radiological images below and agree with the radiology interpretations.  Ct Angio Head W Or Wo Contrast  Result Date: 03/23/2018 CLINICAL DATA:  Right-sided weakness, aphasia EXAM: CT ANGIOGRAPHY HEAD AND NECK TECHNIQUE: Multidetector CT imaging of the head and neck was performed using the standard protocol during bolus administration of intravenous contrast. Multiplanar CT image reconstructions and MIPs were obtained to evaluate the vascular anatomy. Carotid stenosis measurements (when applicable) are obtained utilizing NASCET criteria, using the distal internal carotid diameter as the denominator. CONTRAST:  42mL ISOVUE-370 IOPAMIDOL (ISOVUE-370) INJECTION 76% COMPARISON:  CT head 03/23/2018 FINDINGS: CTA NECK FINDINGS Aortic arch: Atherosclerotic aortic arch without aneurysm. Mild atherosclerotic disease and tortuosity in the proximal great vessels without significant stenosis. Right carotid system: Scattered atherosclerotic disease in the right common carotid artery. Atherosclerotic calcified plaque in the right carotid bifurcation. 50% diameter stenosis  proximal right internal carotid artery. Left carotid system: Scattered atherosclerotic disease left common carotid artery without significant stenosis. Atherosclerotic calcification left carotid bifurcation. There is decreased flow in the left common carotid artery due to acute occlusion of the proximal left internal carotid artery with thrombus present. There is no contrast distal to the proximal left internal carotid artery which is thrombosed. Left internal carotid artery is occluded to the supraclinoid segment. Vertebral arteries: Left vertebral artery dominant with moderate stenosis at the origin. Right vertebral non dominant without significant stenosis. Skeleton: Cervical spondylosis.  No acute skeletal abnormality. Other neck: Negative for mass or adenopathy in the neck. Upper chest: Transvenous pacemaker left. Interstitial fibrosis of the lungs bilaterally. No pleural effusion. Review of the MIP images confirms the above findings CTA HEAD FINDINGS Anterior circulation: Left internal carotid artery is occluded through the cavernous segment with reconstitution of the supraclinoid segment via flow from the anterior communicating artery. There is clot throughout the left M1 segment with slow flow. There is a small amount of flow in left M2 branches with occlusion of the posterior division of left M2. Both anterior cerebral arteries are patent. Right middle cerebral artery patent without stenosis. Atherosclerotic plaque in the right cavernous carotid with mild stenosis. Posterior circulation: Both vertebral arteries patent to the basilar. Left vertebral dominant. Right PICA patent. Left PICA not visualized. AICA patent bilaterally. Basilar widely patent. Superior cerebellar artery patent. Right posterior cerebral artery patent. Probable fetal origin of the left posterior cerebral artery with supply from a hypoplastic left P1 segment. Venous sinuses: Patent Anatomic variants: None Delayed phase: Not performed  Review of the MIP images confirms the above findings IMPRESSION: 1. 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 Reconstitution of the supraclinoid left internal carotid artery through the anterior communicating artery. There is clot in the left M1 and M2 segments with decreased blood flow. There is occlusion of the posterior division of the left middle cerebral artery due to clot. 2. 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 3. Moderate stenosis origin of left vertebral artery 4. Probable fetal origin left posterior cerebral artery which is supplied from a small hypoplastic left P1 segment. 5. These results were  called by telephone at the time of interpretation on 03/23/2018 at 6:33 pm to Dr. Leonel Ramsay , who verbally acknowledged these results. Electronically Signed   By: Franchot Gallo M.D.   On: 03/23/2018 18:54   Ct Angio Neck W Or Wo Contrast  Result Date: 03/23/2018 CLINICAL DATA:  Right-sided weakness, aphasia EXAM: CT ANGIOGRAPHY HEAD AND NECK TECHNIQUE: Multidetector CT imaging of the head and neck was performed using the standard protocol during bolus administration of intravenous contrast. Multiplanar CT image reconstructions and MIPs were obtained to evaluate the vascular anatomy. Carotid stenosis measurements (when applicable) are obtained utilizing NASCET criteria, using the distal internal carotid diameter as the denominator. CONTRAST:  19mL ISOVUE-370 IOPAMIDOL (ISOVUE-370) INJECTION 76% COMPARISON:  CT head 03/23/2018 FINDINGS: CTA NECK FINDINGS Aortic arch: Atherosclerotic aortic arch without aneurysm. Mild atherosclerotic disease and tortuosity in the proximal great vessels without significant stenosis. Right carotid system: Scattered atherosclerotic disease in the right common carotid artery. Atherosclerotic calcified plaque in the right carotid bifurcation. 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 Left carotid system: Scattered  atherosclerotic disease left common carotid artery without significant stenosis. Atherosclerotic calcification left carotid bifurcation. There is decreased flow in the left common carotid artery due to acute occlusion of the proximal left internal carotid artery with thrombus present. There is no contrast distal to the proximal left internal carotid artery which is thrombosed. Left internal carotid artery is occluded to the supraclinoid segment. Vertebral arteries: Left vertebral artery dominant with moderate stenosis at the origin. Right vertebral non dominant without significant stenosis. Skeleton: Cervical spondylosis.  No acute skeletal abnormality. Other neck: Negative for mass or adenopathy in the neck. Upper chest: Transvenous pacemaker left. Interstitial fibrosis of the lungs bilaterally. No pleural effusion. Review of the MIP images confirms the above findings CTA HEAD FINDINGS Anterior circulation: Left internal carotid artery is occluded through the cavernous segment with reconstitution of the supraclinoid segment via flow from the anterior communicating artery. There is clot throughout the left M1 segment with slow flow. There is a small amount of flow in left M2 branches with occlusion of the posterior division of left M2. Both anterior cerebral arteries are patent. Right middle cerebral artery patent without stenosis. Atherosclerotic plaque in the right cavernous carotid with mild stenosis. Posterior circulation: Both vertebral arteries patent to the basilar. Left vertebral dominant. Right PICA patent. Left PICA not visualized. AICA patent bilaterally. Basilar widely patent. Superior cerebellar artery patent. Right posterior cerebral artery patent. Probable fetal origin of the left posterior cerebral artery with supply from a hypoplastic left P1 segment. Venous sinuses: Patent Anatomic variants: None Delayed phase: Not performed Review of the MIP images confirms the above findings IMPRESSION: 1. Acute  occlusion left internal carotid artery at the origin. Reconstitution of the supraclinoid left internal carotid artery through the anterior communicating artery. There is clot in the left M1 and M2 segments with decreased blood flow. There is occlusion of the posterior division of the left middle cerebral artery due to clot. 2. 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 3. Moderate stenosis origin of left vertebral artery 4. Probable fetal origin left posterior cerebral artery which is supplied from a small hypoplastic left P1 segment. 5. These results were called by telephone at the time of interpretation on 03/23/2018 at 6:33 pm to Dr. Leonel Ramsay , who verbally acknowledged these results. Electronically Signed   By: Franchot Gallo M.D.   On: 03/23/2018 18:54   Dg Chest Port 1 View  Result Date: 03/23/2018  CLINICAL DATA:  Post intubation and orogastric tube placement. EXAM: PORTABLE CHEST 1 VIEW COMPARISON:  02/18/2018 FINDINGS: Endotracheal tube has tip 2.9 cm above the carina. Placement of enteric tube with tip over the stomach in the left upper quadrant. Left-sided pacemaker unchanged. Lungs are hypoinflated demonstrate stable chronic bilateral interstitial disease. Mild prominence of the perihilar markings suggesting mild degree of vascular congestion cardiomediastinal silhouette and remainder of the exam is unchanged. IMPRESSION: Suggestion mild vascular congestion.  Chronic interstitial disease. Tubes and lines as described. Electronically Signed   By: Marin Olp M.D.   On: 03/23/2018 21:56   Ct Head Code Stroke Wo Contrast  Result Date: 03/23/2018 CLINICAL DATA:  Code stroke.  Right-sided weakness, aphasia EXAM: CT HEAD WITHOUT CONTRAST TECHNIQUE: Contiguous axial images were obtained from the base of the skull through the vertex without intravenous contrast. COMPARISON:  None. FINDINGS: Brain: Moderate atrophy. Patchy hypodensity in the cerebral white matter bilaterally  compatible with mild chronic microvascular ischemia. 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 Negative for acute hemorrhage.  No acute infarct or mass. Vascular: Hyperdense left MCA. Hyperdense terminal left internal carotid artery. Atherosclerotic calcification cavernous carotid bilaterally. Skull: Negative Sinuses/Orbits: Paranasal sinuses clear. Bilateral cataract surgery. Other: None ASPECTS ([place] Stroke Program Early CT Score) - Ganglionic level infarction (caudate, lentiform nuclei, internal capsule, insula, M1-M3 cortex): 7 - Supraganglionic infarction (M4-M6 cortex): 3 Total score (0-10 with 10 being normal): 10 IMPRESSION: 1. Hyperdense terminal left internal carotid artery and left MCA compatible with thrombosis. No evidence of acute infarct or hemorrhage 2. Atrophy and chronic microvascular ischemic changes in the white matter. 3. ASPECTS is 10 4. These results were called by telephone at the time of interpretation on 03/23/2018 at 6:33 pm to Dr. Leonel Ramsay, who verbally acknowledged these results. Electronically Signed   By: Franchot Gallo M.D.   On: 03/23/2018 18:35   TTE - Full study not performed as pt requested study to be stopped   prior to completion; only partial parasternal and no   apical/subcostal images obtained; vigorous LV systolic function;   doppler incomplete.  Ct Head Wo Contrast Result Date: 03/24/2018 CLINICAL DATA:  77 y/o M; left ICA occlusion post intra-arterial intervention. EXAM: CT HEAD WITHOUT CONTRAST TECHNIQUE: Contiguous axial images were obtained from the base of the skull through the vertex without intravenous contrast. COMPARISON:  03/23/2018 CT head, CTA head, and cerebral angiogram. FINDINGS: Brain: Cortical hypoattenuation with loss of gray-white differentiation and mild edema of the left lateral temporal lobe, temporal operculum, insula left parietal lobe, and the lateral aspect of the left occipital lobe compatible with late  acute/early subacute infarction. No acute hemorrhage. No extra-axial collection, hydrocephalus, midline shift, or herniation. No additional area of acute stroke. Stable background of chronic microvascular ischemic changes and volume loss of the brain. 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 Vascular: Calcific atherosclerosis of carotid siphons and vertebral arteries. No hyperdense vessel identified. Skull: Normal. Negative for fracture or focal lesion. Sinuses/Orbits: No acute finding. Other: None. IMPRESSION: 1. 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 No hemorrhage or significant mass effect. 2. Stable background of chronic microvascular ischemic changes and volume loss of the brain. 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 These results will be called to the ordering clinician or representative by the Radiologist Assistant, and communication documented in the PACS or zVision Dashboard. Electronically Signed   By: Kristine Garbe  M.D.   On: 03/24/2018 20:38    PHYSICAL EXAM  Temp:  [96.5 F (35.8 C)-99.3 F (37.4 C)] 98.8 F (37.1 C) (12/07 1119) Pulse Rate:  [59-69] 64 (12/07 1200) Resp:  [15-23] 23 (12/07 1200) BP: (109-135)/(57-73) 114/65 (12/07 1200) SpO2:  [93 %-100 %] 95 % (12/07 1408) FiO2 (%):  [40 %-50 %] 50 % (12/07 1408)  General - Well nourished, well developed elderly African-American male   . Afebrile. Head is nontraumatic. Neck is supple without bruit.    Cardiac exam no murmur or gallop. Lungs are clear to auscultation. Distal pulses are well felt. Neurological Exam :  -  Awake alert.  . Confused and disoriented and follows only simple midline and one-step commands.. Eyes move to both directions, no gaze preference. Pupil 90mm bilaterally, reactive to light, not consistently blinking to visual threat bilaterally but attending objects to both sides.  Bilateral  corneal and cough reflex present.  Facial symmetric  Moving all extremities  But mild right hemiparesis 4/5. Mild bilateral proximal lower extremity weakness right greater than left.Marland Kitchen DTR 1+ and no babinski bilaterally. Sensation, coordination and gait not tested.     ASSESSMENT/PLAN Terry Peck is a 77 y.o. male with history of CHF on pacer, pulmonary fibrosis, HTN, HLD admitted for right side weakness and aphasia. TPA given. 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 Large vessel athro  Resultant  Confusion and mild weakness  CT head left MCA hyperdense sign, b/l hygroma   CTA head and neck - left ICA and MCA occlusion, left VA origin athero, left fetal PCA patent distally  MRI / MRA not able to perform due to pacemaker  CT repeat left MCA infarct  DSA left ICA and MCA occlusion s/p TICI 3 reperfusion  2D Echo EF 65-70%  Pacemaker interrogation no afib  LDL 47  HgbA1c 7.0  SCDs for VTE prophylaxis  NPO  aspirin 81 mg daily prior to admission, now on ASA 325mg .  Ongoing aggressive stroke risk factor management  Therapy recommendations:  pending  Disposition:  Pending  Respiratory failure with pulmonary fibrosis  Following with pulmonary for PF as outpt  Intubated - extubated- reintubated - self extubated - re-intubated  CCM on board  On weaning trial now  Plan for extubate today  On Levaquin for aspiration prevention  CHF with pacer  Followed Dr. Rayann Heman  EF 60-65% in 08/2016  TTE this time EF 65-70%  Pacer interrogation no afib  Diabetes  HgbA1c 7.0 goal < 7.0  Controlled  Hyperglycemia continued with TF  Home meds - lantus 50 U daily  Added lantus 15U bid and novolog 3U q4h  CBG monitoring  SSI   DM coordinator on board, appreciate recs  Hypotension Hx of hypertension . Stable at  the low end . Off levophed for now . On Coreg 12.5 - continue  Long term BP goal normotensive  Hyperlipidemia  Home meds:  lipitor 20   LDL 47, goal < 70  Now on lipitor 20  Continue statin at discharge  Gross hematuria, resolved  Likely due to uretal trauma  Difficulty foley insertion - urology put in catheter  Hematuria resolved   On ASA 325mg   Continue monitoring  Other Stroke Risk Factors  Advanced age  Other Active Problems  Hyperkalemia - K+ 5.8->4.2-> 3.9->4.0->3.5->3.6  Leukocytosis WBC 9.7->13.5->13.3->12.8->9.8  Hospital day # 6 Discussion bedside with Dr. Loanne Drilling critical care M.D. No family available. Expect transfer to the floor over the next few days when stable from respiratory standpoint.Continue Aspirin for now This patient is critically ill due to acute stroke, s/p tPA and intervention, pulmonary fibrosis, respiratory failure and at significant risk of neurological worsening, death form recurrent stroke, hemorrhagic conversion, heart failure, seizure. This patient's care requires constant monitoring of vital signs, hemodynamics, respiratory and cardiac monitoring, review of multiple databases, neurological assessment, discussion with family, other specialists and medical decision making of high complexity. I spent 35 minutes of neurocritical care time in the care of this patient.   Promote Leonie Man, M.D. Stroke Neurology 03/29/2018 2:24 PM    To contact Stroke Continuity provider, please refer to [email or web address] After hours, contact General Neurology

## 2018-03-29 NOTE — Progress Notes (Signed)
Pt placed on heated high flow cannula.  30 liter flow @ 50% FIO2.  Pt currently tolerating well.  Will continue to monitor.

## 2018-03-29 NOTE — Progress Notes (Signed)
NAME:  Terry Peck, MRN:  [id number], DOB:  Jun 07, 1940, LOS: 6 ADMISSION DATE:  03/23/2018, CONSULTATION DATE:  03/23/2018 REFERRING MD:  Dr. 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 S/p tPA and reperfusion. PCCM consulted for ventilation management.  Past Medical History  Pulmonary fibrosis, HTN, DM, chronic diastolic heart failure   Significant Hospital Events   12/1 > Admitted for weakness and aphasia. Found with left ICA and Left M2 occlusion s/p tPA and revascularization 12/2 > Extubated without complication 47/4 > Worsening respiratory status requiring re-intubation. Patient self-extubated and required emergent re-intubation Consults:  Neurology 12/1 PCCM 12/1  Urology 12/3  Procedures:  --ETT 12/1 >> 12/2 --Reintubated 12/3>>Self-extubated on same day>>Reintubated for hypoxemia   Significant Diagnostic Tests:  CT Head 12/1 > 1. Hyperdense terminal left internal carotid artery and left MCA compatible with thrombosis. No evidence of acute infarct or Hemorrhage Atrophy and chronic microvascular ischemic changes in the white matter. CTA Head/Neck 12/1 > 1. 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 Reconstitution of the supraclinoid left internal carotid artery through the anterior communicating artery. There is clot in the left M1 and M2 segments with decreased blood flow. There is occlusion of the posterior division of the left middle cerebral artery due to Clot. 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 Moderate stenosis origin of left vertebral artery Probable fetal origin left posterior cerebral artery which is supplied from a small hypoplastic left P1 segment.  Micro Data:  U/A 12/1 >> Tracheal Asp 12/1 >>  Antimicrobials:  N/A    Interim  history/subjective:  Extubated yesterday to BiPAP. Mask was removed in the early afternoon however patient quickly desatted. Later in the evening ~6:30 pm, patient tolerated oral mouth care on 5L [place] with saturations >90%. Remained on BiPAP overnight. No complaints this morning. Patient watching TV.  Objective   Blood pressure 128/69, pulse 61, temperature 98.8 F (37.1 C), temperature source Axillary, resp. rate 20, height 5\' 7"  (1.702 m), weight 84.2 kg, SpO2 97 %.    Vent Mode: PCV FiO2 (%):  [40 %-50 %] 50 % Set Rate:  [15 bmp] 15 bmp PEEP:  [5 cmH20] 5 cmH20 Pressure Support:  [10 cmH20] 10 cmH20   Intake/Output Summary (Last 24 hours) at 03/29/2018 1036 Last data filed at 03/29/2018 0900 Gross per 24 hour  Intake 791.21 ml  Output 850 ml  Net -58.79 ml   Filed Weights   03/26/18 0500 03/27/18 0500 03/28/18 0500  Weight: 74.1 kg 74 kg 84.2 kg    Examination: General: Chronically ill-appearing male, laying in bed, on BiPAP HEENT NT: [place], AT, wearing BiPAP  Eyes: EOMI, no scleral icterus PULM: No wheezing or rhonchi, mechanical breath sounds from BiPAP CV: RRR, no MGR Abdomen BS+, soft, NTTP Extremities: 1+ pitting edema on lower extremities bilaterally Neuro: Awake, alert, follows commands. Moves extremities x 4 GU: Foley in place  Resolved Hospital Problem list   Hyperkalemia Hematuria secondary to traumatic Foley in setting of TPA Hypotension secondary to sedation  Assessment & Plan:   #Acute on chronic hypoxemic respiratory failure #Pulmonary Fibrosis Exacerbation #Suspected pulmonary edema #Hx of Aspiration  Dr. Lenna Gilford is his pulmonologist. On 2 L via [place] at home. Recent TTE suggestive of PH. 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 Plan  -Tolerated BiPAP overnight. Transition  to humidified high flow today -Continue nebulizers: Pulmicort and Brovana BID, PRN albuterol -Continue Solu-medrol. 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 BP goal <140 -Continue ASA -PT/OT-recommends inpatient rehab on discharge  #HTN #S/P Bi-V pacer/ICD, Followed by [facility]  #H/O G1 Diastolic HF, HLD Plan  -Telemetry -BP goal less than 140 post procedure per neurology -Continue home Lipitor  -Continue Coreg -Hold Cozaar  DM Plan  -Trend Glucose -SSI   Best practice:  Diet: NPO Pain/Anxiety/Delirium protocol VAP protocol DVT prophylaxis: SCD GI prophylaxis: PPI Glucose control: SSI Mobility: PT/OT Code Status: FC Family Communication: Family involved but not at bedside this a.m. Disposition: Continue ICU  Labs   I reviewed the following labs myself:  CBC: Recent Labs  Lab 03/23/18 1831  03/24/18 0403  03/25/18 1648 03/26/18 0423 03/27/18 0308 03/28/18 0529 03/29/18 0647  WBC 7.8  --  9.7   < > 13.9* 13.3* 12.8* 9.8 9.8  NEUTROABS 4.8  --  8.9*  --   --   --   --   --   --   HGB 14.3   < > 12.4*   < > 11.2* 10.9* 10.4* 10.4* 10.4*  HCT 45.2   < > 41.0   < > 35.5* 35.0* 33.1* 32.8* 32.6*  MCV 94.0  --  94.9   < > 93.2 92.6 91.4 92.1 91.8  PLT 208  --  232   < > 203 183 174 170 190   < > = values in this interval not displayed.    Basic Metabolic Panel: Recent Labs  Lab 03/24/18 0403  03/25/18 1648 03/26/18 0423 03/26/18 1320 03/26/18 1639 03/27/18 0308 03/27/18 1659 03/28/18 0529 03/29/18 0647  NA 138   < > 140 139  --   --  139  --  141 145  K 4.2   < > 3.6 4.0  --   --  3.5  --  3.6 3.5  CL 108   < > 107 104  --   --  105  --  106 110  CO2 20*   < > 25 22  --   --  24  --  22 28  GLUCOSE 195*   < > 113* 233*  --   --  236*  --  250* 115*  BUN 23   < > 15 17  --   --  23  --  25* 26*  CREATININE 0.90   < > 1.00 0.87  --   --  0.66  --  0.68 0.85  CALCIUM 8.3*   < > 8.5* 8.7*  --   --   8.7*  --  8.4* 8.4*  MG 2.0  --   --   --  1.9 2.0 2.1 2.2  --   --   PHOS 3.4  --   --   --  2.2* 2.1* 1.7* 2.5  --   --    < > = values in this interval not displayed.   GFR: Estimated Creatinine Clearance: 75.5 mL/min (by C-G formula based on SCr of 0.85 mg/dL). Recent Labs  Lab 03/23/18 2230  03/26/18 0423 03/27/18 0308 03/28/18 0529 03/29/18 0647  WBC  --    < > 13.3* 12.8* 9.8 9.8  LATICACIDVEN 1.0  --   --   --   --   --    < > = values in this interval not displayed.    ABG    Component Value Date/Time   PHART 7.403 03/26/2018 0407   PCO2ART 38.3 03/26/2018 0407   PO2ART 73.0 (L) 03/26/2018 0407   HCO3 23.9 03/26/2018 0407   TCO2 25 03/26/2018 0407   ACIDBASEDEF 1.0 03/26/2018 0407   O2SAT 95.0 03/26/2018 0407    CBG: Recent Labs  Lab 03/28/18 1154 03/28/18 1641 03/28/18 1709 03/29/18 0000 03/29/18 0358  GLUCAP 191* 64* 152* 113* 101*   I reviewed the following imaging myself: CT Head 12/2 - Left MCA infarction CXR 03/24/18 - Chronic interstitial changes, left lower lobe opacity that may represent pneumonia however will need clinical correlation CXR 12/3 - ETT appropriately in place, chronic bilateral interstitial changes TTE 12/3 EF 65-70%. PA peak pressure:54 mmHg  The patient is critically ill with multiple organ systems failure and requires high complexity decision making for assessment and support, frequent evaluation and titration of therapies, application of advanced monitoring technologies and extensive interpretation of multiple databases.   Critical Care Time devoted to patient care services described in this note is  40 Minutes. This time reflects time of care of this signee Dr. Rodman Pickle. This critical care time does not reflect procedure time, or teaching time or supervisory time of PA/NP/Med student/Med Resident etc but could involve care discussion time.  Rodman Pickle, M.D. [facility] Pulmonary/Critical Care Medicine. Pager: [phone]. After  hours pager: [phone].

## 2018-03-29 NOTE — Progress Notes (Signed)
Will not be doing tonight's mouth care 2nd to pt quickly desating  upon removal of Bipap mask.

## 2018-03-30 ENCOUNTER — Inpatient Hospital Stay (HOSPITAL_COMMUNITY): Payer: Medicare Other

## 2018-03-30 LAB — GLUCOSE, CAPILLARY
GLUCOSE-CAPILLARY: 132 mg/dL — AB (ref 70–99)
Glucose-Capillary: 106 mg/dL — ABNORMAL HIGH (ref 70–99)
Glucose-Capillary: 130 mg/dL — ABNORMAL HIGH (ref 70–99)
Glucose-Capillary: 141 mg/dL — ABNORMAL HIGH (ref 70–99)
Glucose-Capillary: 143 mg/dL — ABNORMAL HIGH (ref 70–99)
Glucose-Capillary: 192 mg/dL — ABNORMAL HIGH (ref 70–99)
Glucose-Capillary: 295 mg/dL — ABNORMAL HIGH (ref 70–99)

## 2018-03-30 MED ORDER — CHLORHEXIDINE GLUCONATE 0.12 % MT SOLN
15.0000 mL | Freq: Two times a day (BID) | OROMUCOSAL | Status: DC
  Administered 2018-03-30 – 2018-04-07 (×14): 15 mL via OROMUCOSAL
  Filled 2018-03-30 (×13): qty 15

## 2018-03-30 MED ORDER — INSULIN ASPART 100 UNIT/ML ~~LOC~~ SOLN
0.0000 [IU] | SUBCUTANEOUS | Status: DC
  Administered 2018-03-30 (×3): 1 [IU] via SUBCUTANEOUS
  Administered 2018-03-30: 2 [IU] via SUBCUTANEOUS

## 2018-03-30 MED ORDER — ORAL CARE MOUTH RINSE
15.0000 mL | Freq: Two times a day (BID) | OROMUCOSAL | Status: DC
  Administered 2018-03-31 – 2018-04-07 (×15): 15 mL via OROMUCOSAL

## 2018-03-30 MED ORDER — INSULIN ASPART 100 UNIT/ML ~~LOC~~ SOLN
0.0000 [IU] | SUBCUTANEOUS | Status: DC
  Administered 2018-03-30: 11 [IU] via SUBCUTANEOUS
  Administered 2018-03-31: 4 [IU] via SUBCUTANEOUS
  Administered 2018-03-31: 7 [IU] via SUBCUTANEOUS
  Administered 2018-03-31: 4 [IU] via SUBCUTANEOUS

## 2018-03-30 MED ORDER — METHYLPREDNISOLONE SODIUM SUCC 125 MG IJ SOLR
60.0000 mg | Freq: Every day | INTRAMUSCULAR | Status: AC
  Administered 2018-03-30 – 2018-03-31 (×2): 60 mg via INTRAVENOUS
  Filled 2018-03-30 (×2): qty 2

## 2018-03-30 NOTE — Progress Notes (Signed)
NAME:  Terry Peck, MRN:  [id number], DOB:  June 23, 1940, LOS: 7 ADMISSION DATE:  03/23/2018, CONSULTATION DATE:  03/23/2018 REFERRING MD:  Dr. 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 S/p tPA and reperfusion. PCCM consulted for ventilation management.  Past Medical History  Pulmonary fibrosis, HTN, DM, chronic diastolic heart failure   Significant Hospital Events   12/1 > Admitted for weakness and aphasia. Found with left ICA and Left M2 occlusion s/p tPA and revascularization 12/2 > Extubated without complication 54/0 > Worsening respiratory status requiring re-intubation. Patient self-extubated and required emergent re-intubation 12/6 Extubated to BiPAP 12/7 Transitioned to humidified high flow 12/8 Tolerating [place] Consults:  Neurology 12/1 PCCM 12/1  Urology 12/3  Procedures:  --ETT 12/1 >> 12/2 --Reintubated 12/3>>Self-extubated on same day>>Reintubated for hypoxemia   Significant Diagnostic Tests:  CT Head 12/1 > 1. Hyperdense terminal left internal carotid artery and left MCA compatible with thrombosis. No evidence of acute infarct or Hemorrhage Atrophy and chronic microvascular ischemic changes in the white matter. CTA Head/Neck 12/1 > 1. 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 Reconstitution of the supraclinoid left internal carotid artery through the anterior communicating artery. There is clot in the left M1 and M2 segments with decreased blood flow. There is occlusion of the posterior division of the left middle cerebral artery due to Clot. 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 Moderate stenosis origin of left vertebral artery Probable fetal origin left posterior cerebral artery which is supplied from a small hypoplastic left P1  segment.  Micro Data:  U/A 12/1 >> Tracheal Asp 12/1 >>  Antimicrobials:  N/A    Interim history/subjective:  Sitting in bed comfortably on nasal cannula.  No complaints however has difficulty with word finding.  Objective   Blood pressure (!) 73/38, pulse 86, temperature 98.9 F (37.2 C), temperature source Axillary, resp. rate 18, height 5\' 7"  (1.702 m), weight 84.2 kg, SpO2 95 %.    FiO2 (%):  [40 %-50 %] 40 %   Intake/Output Summary (Last 24 hours) at 03/30/2018 2002 Last data filed at 03/30/2018 1800 Gross per 24 hour  Intake 169.4 ml  Output 2025 ml  Net -1855.6 ml   Filed Weights   03/26/18 0500 03/27/18 0500 03/28/18 0500  Weight: 74.1 kg 74 kg 84.2 kg    Examination: General: Chronically ill-appearing male, laying in bed, on nasal cannula HEENT NT: [place], AT Eyes: EOMI, no scleral icterus PULM: Faint bibasilar crackles CV: RRR, no MGR Abdomen bowel sounds present, soft, nontender to palpation Extremities: No edema or cyanosis Neuro: Awake and alert, with word salad, follows commands, moves extremities x4 GU: Foley in place  Resolved Hospital Problem list   Hyperkalemia Hematuria secondary to traumatic Foley in setting of TPA Hypotension secondary to sedation  Assessment & Plan:   #Acute on chronic hypoxemic respiratory failure #Pulmonary Fibrosis Exacerbation #Suspected pulmonary edema #Hx of Aspiration  Dr. Lenna Gilford is his pulmonologist. On 2 L via [place] at home. Recent TTE suggestive of PH. 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 Plan  -Transitioned to nasal cannula.  Wean for goal oxygen saturations 88 to 92% -Continue nebulizers: Pulmicort and Brovana BID, PRN albuterol -Continue Solu-Medrol x 5 days.  Patient continues to be n.p.o. 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 BP goal <140 -Continue ASA -PT/OT-recommends inpatient rehab on discharge  #HTN #S/P Bi-V pacer/ICD, Followed by [facility]  #H/O G1 Diastolic HF, HLD Plan  -Telemetry -BP goal less than 140 post procedure per neurology -Continue home Lipitor  -Continue Coreg -Hold Cozaar  DM Plan  -Trend glucose -SSI   Best practice:  Diet: NPO Pain/Anxiety/Delirium protocol VAP protocol DVT prophylaxis: SCD GI prophylaxis: PPI Glucose control: SSI Mobility: PT/OT Code Status: FC Family Communication: Family involved but not at bedside this a.m. Disposition: Continue ICU  Labs   I reviewed the following labs myself:  CBC: Recent Labs  Lab 03/24/18 0403  03/25/18 1648 03/26/18 0423 03/27/18 0308 03/28/18 0529 03/29/18 0647  WBC 9.7   < > 13.9* 13.3* 12.8* 9.8 9.8  NEUTROABS 8.9*  --   --   --   --   --   --   HGB 12.4*   < > 11.2* 10.9* 10.4* 10.4* 10.4*  HCT 41.0   < > 35.5* 35.0* 33.1* 32.8* 32.6*  MCV 94.9   < > 93.2 92.6 91.4 92.1 91.8  PLT 232   < > 203 183 174 170 190   < > = values in this interval not displayed.    Basic Metabolic Panel: Recent Labs  Lab 03/24/18 0403  03/25/18 1648 03/26/18 0423 03/26/18 1320 03/26/18 1639 03/27/18 0308 03/27/18 1659 03/28/18 0529 03/29/18 0647  NA 138   < > 140 139  --   --  139  --  141 145  K 4.2   < > 3.6 4.0  --   --  3.5  --  3.6 3.5  CL 108   < > 107 104  --   --  105  --  106 110  CO2 20*   < > 25 22  --   --  24  --  22 28  GLUCOSE 195*   < > 113* 233*  --   --  236*  --  250* 115*  BUN 23   < > 15 17  --   --  23  --  25* 26*  CREATININE 0.90   < > 1.00 0.87  --   --  0.66  --  0.68 0.85  CALCIUM 8.3*   < > 8.5* 8.7*  --   --  8.7*  --  8.4* 8.4*  MG 2.0  --   --   --  1.9 2.0 2.1 2.2  --   --   PHOS 3.4  --   --   --  2.2* 2.1* 1.7* 2.5  --   --    < > = values in this interval not displayed.   GFR: Estimated Creatinine Clearance: 75.5 mL/min  (by C-G formula based on SCr of 0.85 mg/dL). Recent Labs  Lab 03/23/18 2230  03/26/18 0423 03/27/18 0308 03/28/18 0529 03/29/18 0647  WBC  --    < > 13.3* 12.8* 9.8 9.8  LATICACIDVEN 1.0  --   --   --   --   --    < > = values in this interval not displayed.    ABG    Component Value Date/Time   PHART 7.403 03/26/2018 0407   PCO2ART 38.3 03/26/2018 0407  PO2ART 73.0 (L) 03/26/2018 0407   HCO3 23.9 03/26/2018 0407   TCO2 25 03/26/2018 0407   ACIDBASEDEF 1.0 03/26/2018 0407   O2SAT 95.0 03/26/2018 0407    CBG: Recent Labs  Lab 03/30/18 0354 03/30/18 0748 03/30/18 1147 03/30/18 1555 03/30/18 1933  GLUCAP 132* 130* 143* 141* 192*   I reviewed the following imaging myself: CT Head 12/2 - Left MCA infarction CXR 03/24/18 - Chronic interstitial changes, left lower lobe opacity that may represent pneumonia however will need clinical correlation CXR 12/3 - ETT appropriately in place, chronic bilateral interstitial changes TTE 12/3 EF 65-70%. PA peak pressure:54 mmHg  Rodman Pickle, M.D. [facility] Pulmonary/Critical Care Medicine Pager: [phone] After hours pager: [phone]

## 2018-03-30 NOTE — Progress Notes (Signed)
eLink Physician-Brief Progress Note Patient Name: Terry Peck DOB: [birth year] MRN: [id number]   Date of Service  03/30/2018  HPI/Events of Note  Hyperglycemia - Blood glucose = 293. Patient is on Solumedrol.   eICU Interventions  Will order: 1. Increase to Q 4 hour resistant Novolog SSI.      Intervention Category Major Interventions: Hyperglycemia - active titration of insulin therapy  Sommer,Steven Eugene 03/30/2018, 11:30 PM

## 2018-03-30 NOTE — Progress Notes (Signed)
Modified Barium Swallow Progress Note  Patient Details  Name: Terry Peck MRN: [id number] Date of Birth: [birth year]  Today's Date: 03/30/2018  Modified Barium Swallow completed.  Full report located under Chart Review in the Imaging Section.  Brief recommendations include the following:  Clinical Impression  No cough response, cued cough, but ineffective to clear. With nectar thick liquids similarly the swallow was initiated at the pyriform sinuses, penetration and aspiration (although a slightly smaller volume) during the swallow. No cough response by patient, verbally cued by SLP,  but ineffective to clear. REduced laryngeal elevation and incomplete closure of larynx. However with a verbal and tactile cue patient implemented a chin tuck resulting in no penetration/aspiratin with nectar thickened liquids. Honey thickened liquids, purees and solids, the swallow was triggered at the valleculae with no penetration/aspriation noted. Despite command to swallow pill whole, pt chewed pill in applesauce and it passed easily through the pharynx and upper esophagus. Recommend Dys 2 diet with honey thickened liquids. Medications given whole in puree. Dysphagia therapy to include training to use chin tuck consistently, continued evaluation for diet upgrade (repeat MBS due to silent aspiration) and education with patient and family on aspiration precautions.    Swallow Evaluation Recommendations       SLP Diet Recommendations: Dysphagia 2 (Fine chop) solids;Honey thick liquids   Liquid Administration via: Cup   Medication Administration: Whole meds with puree   Supervision: Staff to assist with self feeding   Compensations: Minimize environmental distractions;Slow rate;Small sips/bites   Postural Changes: Remain semi-upright after after feeds/meals (Comment);Seated upright at 90 degrees   Oral Care Recommendations: Oral care QID   Other Recommendations: Order thickener from [place], [place], CCC-SLP 03/30/2018 2:28 PM

## 2018-03-30 NOTE — Progress Notes (Signed)
eLink Physician-Brief Progress Note Patient Name: Terry Peck DOB: 1940/11/10 MRN: [id number]   Date of Service  03/30/2018  HPI/Events of Note  Patient has been getting 1/2 dose resistant SSI d/t hypoglycemic event yesterday afternoon. Tube feeding now off. Request to change SSI to sensitive.   eICU Interventions  Will change insulin coverage to 1/2 dose sensitive Novolog SSI.     Intervention Category Major Interventions: Hyperglycemia - active titration of insulin therapy  Marlisa Caridi Eugene 03/30/2018, 4:44 AM

## 2018-03-30 NOTE — Progress Notes (Signed)
STROKE TEAM PROGRESS NOTE   SUBJECTIVE (INTERVAL HISTORY) No one is at the bedside. Pt is sitting up in bed states he has no complaints. He is now on high flow in modified oxygen and tolerating it well. Oxygen sats a satisfactory is not in respiratory distress.  OBJECTIVE Temp:  [97.4 F (36.3 C)-99 F (37.2 C)] 97.4 F (36.3 C) (12/08 0800) Pulse Rate:  [62-75] 72 (12/08 1200) Cardiac Rhythm: Ventricular paced (12/07 2000) Resp:  [12-23] 23 (12/08 1200) BP: (108-140)/(61-84) 138/76 (12/08 1200) SpO2:  [91 %-100 %] 100 % (12/08 1200) FiO2 (%):  [40 %-50 %] 40 % (12/08 0845)  Recent Labs  Lab 03/29/18 1944 03/29/18 2359 03/30/18 0354 03/30/18 0748 03/30/18 1147  GLUCAP 156* 106* 132* 130* 143*   Recent Labs  Lab 03/24/18 0403  03/25/18 1648 03/26/18 0423 03/26/18 1320 03/26/18 1639 03/27/18 0308 03/27/18 1659 03/28/18 0529 03/29/18 0647  NA 138   < > 140 139  --   --  139  --  141 145  K 4.2   < > 3.6 4.0  --   --  3.5  --  3.6 3.5  CL 108   < > 107 104  --   --  105  --  106 110  CO2 20*   < > 25 22  --   --  24  --  22 28  GLUCOSE 195*   < > 113* 233*  --   --  236*  --  250* 115*  BUN 23   < > 15 17  --   --  23  --  25* 26*  CREATININE 0.90   < > 1.00 0.87  --   --  0.66  --  0.68 0.85  CALCIUM 8.3*   < > 8.5* 8.7*  --   --  8.7*  --  8.4* 8.4*  MG 2.0  --   --   --  1.9 2.0 2.1 2.2  --   --   PHOS 3.4  --   --   --  2.2* 2.1* 1.7* 2.5  --   --    < > = values in this interval not displayed.   Recent Labs  Lab 03/23/18 1831  AST 48*  ALT 28  ALKPHOS 33*  BILITOT 1.2  PROT 6.2*  ALBUMIN 3.1*   Recent Labs  Lab 03/23/18 1831  03/24/18 0403  03/25/18 1648 03/26/18 0423 03/27/18 0308 03/28/18 0529 03/29/18 0647  WBC 7.8  --  9.7   < > 13.9* 13.3* 12.8* 9.8 9.8  NEUTROABS 4.8  --  8.9*  --   --   --   --   --   --   HGB 14.3   < > 12.4*   < > 11.2* 10.9* 10.4* 10.4* 10.4*  HCT 45.2   < > 41.0   < > 35.5* 35.0* 33.1* 32.8* 32.6*  MCV 94.0  --  94.9    < > 93.2 92.6 91.4 92.1 91.8  PLT 208  --  232   < > 203 183 174 170 190   < > = values in this interval not displayed.   No results for input(s): CKTOTAL, CKMB, CKMBINDEX, TROPONINI in the last 168 hours. No results for input(s): LABPROT, INR in the last 72 hours. No results for input(s): COLORURINE, LABSPEC, [place], GLUCOSEU, HGBUR, BILIRUBINUR, KETONESUR, PROTEINUR, UROBILINOGEN, NITRITE, LEUKOCYTESUR in the last 72 hours.  Invalid input(s): APPERANCEUR     Component  Value Date/Time   CHOL 95 03/24/2018 0403   CHOL 104 07/17/2016 0814   TRIG 99 03/28/2018 0529   HDL 28 (L) 03/24/2018 0403   HDL 42 07/17/2016 0814   CHOLHDL 3.4 03/24/2018 0403   VLDL 20 03/24/2018 0403   LDLCALC 47 03/24/2018 0403   LDLCALC 42 07/17/2016 0814   Lab Results  Component Value Date   HGBA1C 7.0 (H) 03/24/2018   No results found for: LABOPIA, COCAINSCRNUR, LABBENZ, AMPHETMU, THCU, LABBARB  No results for input(s): ETH in the last 168 hours.  I have personally reviewed the radiological images below and agree with the radiology interpretations.  Ct Angio Head W Or Wo Contrast  Result Date: 03/23/2018 CLINICAL DATA:  Right-sided weakness, aphasia EXAM: CT ANGIOGRAPHY HEAD AND NECK TECHNIQUE: Multidetector CT imaging of the head and neck was performed using the standard protocol during bolus administration of intravenous contrast. Multiplanar CT image reconstructions and MIPs were obtained to evaluate the vascular anatomy. Carotid stenosis measurements (when applicable) are obtained utilizing NASCET criteria, using the distal internal carotid diameter as the denominator. CONTRAST:  68mL ISOVUE-370 IOPAMIDOL (ISOVUE-370) INJECTION 76% COMPARISON:  CT head 03/23/2018 FINDINGS: CTA NECK FINDINGS Aortic arch: Atherosclerotic aortic arch without aneurysm. Mild atherosclerotic disease and tortuosity in the proximal great vessels without significant stenosis. Right carotid system: Scattered atherosclerotic  disease in the right common carotid artery. Atherosclerotic calcified plaque in the right carotid bifurcation. 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 Left carotid system: Scattered atherosclerotic disease left common carotid artery without significant stenosis. Atherosclerotic calcification left carotid bifurcation. There is decreased flow in the left common carotid artery due to acute occlusion of the proximal left internal carotid artery with thrombus present. There is no contrast distal to the proximal left internal carotid artery which is thrombosed. Left internal carotid artery is occluded to the supraclinoid segment. Vertebral arteries: Left vertebral artery dominant with moderate stenosis at the origin. Right vertebral non dominant without significant stenosis. Skeleton: Cervical spondylosis.  No acute skeletal abnormality. Other neck: Negative for mass or adenopathy in the neck. Upper chest: Transvenous pacemaker left. Interstitial fibrosis of the lungs bilaterally. No pleural effusion. Review of the MIP images confirms the above findings CTA HEAD FINDINGS Anterior circulation: Left internal carotid artery is occluded through the cavernous segment with reconstitution of the supraclinoid segment via flow from the anterior communicating artery. There is clot throughout the left M1 segment with slow flow. There is a small amount of flow in left M2 branches with occlusion of the posterior division of left M2. Both anterior cerebral arteries are patent. Right middle cerebral artery patent without stenosis. Atherosclerotic plaque in the right cavernous carotid with mild stenosis. Posterior circulation: Both vertebral arteries patent to the basilar. Left vertebral dominant. Right PICA patent. Left PICA not visualized. AICA patent bilaterally. Basilar widely patent. Superior cerebellar artery patent. Right posterior cerebral artery patent. Probable fetal origin of the left posterior cerebral  artery with supply from a hypoplastic left P1 segment. Venous sinuses: Patent Anatomic variants: None Delayed phase: Not performed Review of the MIP images confirms the above findings IMPRESSION: 1. 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 Reconstitution of the supraclinoid left internal carotid artery through the anterior communicating artery. There is clot in the left M1 and M2 segments with decreased blood flow. There is occlusion of the posterior division of the left middle cerebral artery due to clot. 2. 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 3. Moderate stenosis origin of  left vertebral artery 4. Probable fetal origin left posterior cerebral artery which is supplied from a small hypoplastic left P1 segment. 5. These results were called by telephone at the time of interpretation on 03/23/2018 at 6:33 pm to Dr. Leonel Ramsay , who verbally acknowledged these results. Electronically Signed   By: Franchot Gallo M.D.   On: 03/23/2018 18:54   Ct Angio Neck W Or Wo Contrast  Result Date: 03/23/2018 CLINICAL DATA:  Right-sided weakness, aphasia EXAM: CT ANGIOGRAPHY HEAD AND NECK TECHNIQUE: Multidetector CT imaging of the head and neck was performed using the standard protocol during bolus administration of intravenous contrast. Multiplanar CT image reconstructions and MIPs were obtained to evaluate the vascular anatomy. Carotid stenosis measurements (when applicable) are obtained utilizing NASCET criteria, using the distal internal carotid diameter as the denominator. CONTRAST:  1mL ISOVUE-370 IOPAMIDOL (ISOVUE-370) INJECTION 76% COMPARISON:  CT head 03/23/2018 FINDINGS: CTA NECK FINDINGS Aortic arch: Atherosclerotic aortic arch without aneurysm. Mild atherosclerotic disease and tortuosity in the proximal great vessels without significant stenosis. Right carotid system: Scattered atherosclerotic disease in the right common carotid artery. Atherosclerotic calcified plaque in  the right carotid bifurcation. 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 Left carotid system: Scattered atherosclerotic disease left common carotid artery without significant stenosis. Atherosclerotic calcification left carotid bifurcation. There is decreased flow in the left common carotid artery due to acute occlusion of the proximal left internal carotid artery with thrombus present. There is no contrast distal to the proximal left internal carotid artery which is thrombosed. Left internal carotid artery is occluded to the supraclinoid segment. Vertebral arteries: Left vertebral artery dominant with moderate stenosis at the origin. Right vertebral non dominant without significant stenosis. Skeleton: Cervical spondylosis.  No acute skeletal abnormality. Other neck: Negative for mass or adenopathy in the neck. Upper chest: Transvenous pacemaker left. Interstitial fibrosis of the lungs bilaterally. No pleural effusion. Review of the MIP images confirms the above findings CTA HEAD FINDINGS Anterior circulation: Left internal carotid artery is occluded through the cavernous segment with reconstitution of the supraclinoid segment via flow from the anterior communicating artery. There is clot throughout the left M1 segment with slow flow. There is a small amount of flow in left M2 branches with occlusion of the posterior division of left M2. Both anterior cerebral arteries are patent. Right middle cerebral artery patent without stenosis. Atherosclerotic plaque in the right cavernous carotid with mild stenosis. Posterior circulation: Both vertebral arteries patent to the basilar. Left vertebral dominant. Right PICA patent. Left PICA not visualized. AICA patent bilaterally. Basilar widely patent. Superior cerebellar artery patent. Right posterior cerebral artery patent. Probable fetal origin of the left posterior cerebral artery with supply from a hypoplastic left P1 segment. Venous sinuses: Patent  Anatomic variants: None Delayed phase: Not performed Review of the MIP images confirms the above findings IMPRESSION: 1. 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 Reconstitution of the supraclinoid left internal carotid artery through the anterior communicating artery. There is clot in the left M1 and M2 segments with decreased blood flow. There is occlusion of the posterior division of the left middle cerebral artery due to clot. 2. 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 3. Moderate stenosis origin of left vertebral artery 4. Probable fetal origin left posterior cerebral artery which is supplied from a small hypoplastic left P1 segment. 5. These results were called by telephone at the time of interpretation on 03/23/2018 at 6:33 pm to Dr. Leonel Ramsay , who verbally acknowledged these results.  Electronically Signed   By: Franchot Gallo M.D.   On: 03/23/2018 18:54   Dg Chest Port 1 View  Result Date: 03/23/2018 CLINICAL DATA:  Post intubation and orogastric tube placement. EXAM: PORTABLE CHEST 1 VIEW COMPARISON:  02/18/2018 FINDINGS: Endotracheal tube has tip 2.9 cm above the carina. Placement of enteric tube with tip over the stomach in the left upper quadrant. Left-sided pacemaker unchanged. Lungs are hypoinflated demonstrate stable chronic bilateral interstitial disease. Mild prominence of the perihilar markings suggesting mild degree of vascular congestion cardiomediastinal silhouette and remainder of the exam is unchanged. IMPRESSION: Suggestion mild vascular congestion.  Chronic interstitial disease. Tubes and lines as described. Electronically Signed   By: Marin Olp M.D.   On: 03/23/2018 21:56   Ct Head Code Stroke Wo Contrast  Result Date: 03/23/2018 CLINICAL DATA:  Code stroke.  Right-sided weakness, aphasia EXAM: CT HEAD WITHOUT CONTRAST TECHNIQUE: Contiguous axial images were obtained from the base of the skull through the vertex without intravenous  contrast. COMPARISON:  None. FINDINGS: Brain: Moderate atrophy. Patchy hypodensity in the cerebral white matter bilaterally compatible with mild chronic microvascular ischemia. 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 Negative for acute hemorrhage.  No acute infarct or mass. Vascular: Hyperdense left MCA. Hyperdense terminal left internal carotid artery. Atherosclerotic calcification cavernous carotid bilaterally. Skull: Negative Sinuses/Orbits: Paranasal sinuses clear. Bilateral cataract surgery. Other: None ASPECTS ([place] Stroke Program Early CT Score) - Ganglionic level infarction (caudate, lentiform nuclei, internal capsule, insula, M1-M3 cortex): 7 - Supraganglionic infarction (M4-M6 cortex): 3 Total score (0-10 with 10 being normal): 10 IMPRESSION: 1. Hyperdense terminal left internal carotid artery and left MCA compatible with thrombosis. No evidence of acute infarct or hemorrhage 2. Atrophy and chronic microvascular ischemic changes in the white matter. 3. ASPECTS is 10 4. These results were called by telephone at the time of interpretation on 03/23/2018 at 6:33 pm to Dr. Leonel Ramsay, who verbally acknowledged these results. Electronically Signed   By: Franchot Gallo M.D.   On: 03/23/2018 18:35   TTE - Full study not performed as pt requested study to be stopped   prior to completion; only partial parasternal and no   apical/subcostal images obtained; vigorous LV systolic function;   doppler incomplete.  Ct Head Wo Contrast Result Date: 03/24/2018 CLINICAL DATA:  77 y/o M; left ICA occlusion post intra-arterial intervention. EXAM: CT HEAD WITHOUT CONTRAST TECHNIQUE: Contiguous axial images were obtained from the base of the skull through the vertex without intravenous contrast. COMPARISON:  03/23/2018 CT head, CTA head, and cerebral angiogram. FINDINGS: Brain: Cortical hypoattenuation with loss of gray-white differentiation and mild edema of the left lateral temporal  lobe, temporal operculum, insula left parietal lobe, and the lateral aspect of the left occipital lobe compatible with late acute/early subacute infarction. No acute hemorrhage. No extra-axial collection, hydrocephalus, midline shift, or herniation. No additional area of acute stroke. Stable background of chronic microvascular ischemic changes and volume loss of the brain. 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 Vascular: Calcific atherosclerosis of carotid siphons and vertebral arteries. No hyperdense vessel identified. Skull: Normal. Negative for fracture or focal lesion. Sinuses/Orbits: No acute finding. Other: None. IMPRESSION: 1. 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 No hemorrhage or significant mass effect. 2. Stable background of chronic microvascular ischemic changes and volume loss of the brain. 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 These results will be called to the  ordering clinician or representative by the Radiologist Assistant, and communication documented in the PACS or zVision Dashboard. Electronically Signed   By: Kristine Garbe M.D.   On: 03/24/2018 20:38    PHYSICAL EXAM  Temp:  [97.4 F (36.3 C)-99 F (37.2 C)] 97.4 F (36.3 C) (12/08 0800) Pulse Rate:  [62-75] 72 (12/08 1200) Resp:  [12-23] 23 (12/08 1200) BP: (108-140)/(61-84) 138/76 (12/08 1200) SpO2:  [91 %-100 %] 100 % (12/08 1200) FiO2 (%):  [40 %-50 %] 40 % (12/08 0845)  General - Well nourished, well developed elderly African-American male   . Afebrile. Head is nontraumatic. Neck is supple without bruit.    Cardiac exam no murmur or gallop. Lungs are clear to auscultation. Distal pulses are well felt. Neurological Exam :  -  Awake alert.  . Confused and disoriented and follows only simple midline and one-step commands.. Eyes move to both directions, no gaze preference. Pupil 2mm  bilaterally, reactive to light, not consistently blinking to visual threat bilaterally but attending objects to both sides.  Bilateral corneal and cough reflex present.  Facial symmetric  Moving all extremities  But mild right hemiparesis 4/5. Mild bilateral proximal lower extremity weakness right greater than left.Marland Kitchen DTR 1+ and no babinski bilaterally. Sensation, coordination and gait not tested.     ASSESSMENT/PLAN [name] is a 77 y.o. male with history of CHF on pacer, pulmonary fibrosis, HTN, HLD admitted for right side weakness and aphasia. TPA given. 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 Large vessel athro  Resultant  Confusion and mild weakness  CT head left MCA hyperdense sign, b/l hygroma   CTA head and neck - left ICA and MCA occlusion, left VA origin athero, left fetal PCA patent distally  MRI / MRA not able to perform due to pacemaker  CT repeat left MCA infarct  DSA left ICA and MCA occlusion s/p TICI 3 reperfusion  2D Echo EF 65-70%  Pacemaker interrogation no afib  LDL 47  HgbA1c 7.0  SCDs for VTE prophylaxis  NPO  aspirin 81 mg daily prior to admission, now on ASA 325mg .  Ongoing aggressive stroke risk factor management  Therapy recommendations:  pending  Disposition:  Pending  Respiratory failure with pulmonary fibrosis  Following with pulmonary for PF as outpt  Intubated - extubated- reintubated - self extubated - re-intubated  CCM on board  On weaning trial now  Plan for extubate today  On Levaquin for aspiration prevention  CHF with pacer  Followed Dr. Rayann Heman  EF 60-65% in 08/2016  TTE this time EF 65-70%  Pacer interrogation no afib  Diabetes  HgbA1c 7.0 goal < 7.0  Controlled  Hyperglycemia continued with TF  Home meds - lantus 50 U daily  Added lantus 15U bid and  novolog 3U q4h  CBG monitoring  SSI   DM coordinator on board, appreciate recs  Hypotension Hx of hypertension . Stable at the low end . Off levophed for now . On Coreg 12.5 - continue  Long term BP goal normotensive  Hyperlipidemia  Home meds:  lipitor 20   LDL 47, goal < 70  Now on lipitor 20  Continue statin at discharge  Gross hematuria, resolved  Likely due to uretal trauma  Difficulty foley insertion - urology put in catheter  Hematuria resolved   On ASA 325mg   Continue monitoring  Other Stroke Risk Factors  Advanced age  Other Active Problems  Hyperkalemia - K+ 5.8->4.2-> 3.9->4.0->3.5->3.6  Leukocytosis WBC 9.7->13.5->13.3->12.8->9.8  Hospital day # 7 Discussion bedside with Dr. Loanne Drilling critical care M.D. No family available. Expect transfer to the floor over the next few days when stable from respiratory standpoint.Continue Aspirin for now.continue present treatmentThis patient is critically ill due to acute stroke, s/p tPA and intervention, pulmonary fibrosis, respiratory failure and at significant risk of neurological worsening, death form recurrent stroke, hemorrhagic conversion, heart failure, seizure. This patient's care requires constant monitoring of vital signs, hemodynamics, respiratory and cardiac monitoring, review of multiple databases, neurological assessment, discussion with family, other specialists and medical decision making of high complexity. I spent 35 minutes of neurocritical care time in the care of this patient.   Promote Leonie Man, M.D. Stroke Neurology 03/30/2018 12:59 PM    To contact Stroke Continuity provider, please refer to [email or web address] After hours, contact General Neurology

## 2018-03-31 LAB — GLUCOSE, CAPILLARY
Glucose-Capillary: 116 mg/dL — ABNORMAL HIGH (ref 70–99)
Glucose-Capillary: 184 mg/dL — ABNORMAL HIGH (ref 70–99)
Glucose-Capillary: 185 mg/dL — ABNORMAL HIGH (ref 70–99)
Glucose-Capillary: 228 mg/dL — ABNORMAL HIGH (ref 70–99)
Glucose-Capillary: 273 mg/dL — ABNORMAL HIGH (ref 70–99)
Glucose-Capillary: 372 mg/dL — ABNORMAL HIGH (ref 70–99)

## 2018-03-31 MED ORDER — RESOURCE THICKENUP CLEAR PO POWD
ORAL | Status: DC | PRN
  Filled 2018-03-31: qty 125

## 2018-03-31 MED ORDER — INSULIN ASPART 100 UNIT/ML ~~LOC~~ SOLN
0.0000 [IU] | Freq: Every day | SUBCUTANEOUS | Status: DC
  Administered 2018-03-31: 5 [IU] via SUBCUTANEOUS
  Administered 2018-04-02: 3 [IU] via SUBCUTANEOUS
  Administered 2018-04-03: 4 [IU] via SUBCUTANEOUS

## 2018-03-31 MED ORDER — INSULIN ASPART 100 UNIT/ML ~~LOC~~ SOLN
0.0000 [IU] | Freq: Three times a day (TID) | SUBCUTANEOUS | Status: DC
  Administered 2018-03-31: 8 [IU] via SUBCUTANEOUS
  Administered 2018-04-01: 5 [IU] via SUBCUTANEOUS
  Administered 2018-04-01: 8 [IU] via SUBCUTANEOUS
  Administered 2018-04-01 – 2018-04-02 (×2): 3 [IU] via SUBCUTANEOUS
  Administered 2018-04-02: 11 [IU] via SUBCUTANEOUS
  Administered 2018-04-02 – 2018-04-03 (×3): 8 [IU] via SUBCUTANEOUS
  Administered 2018-04-03: 11 [IU] via SUBCUTANEOUS
  Administered 2018-04-04: 8 [IU] via SUBCUTANEOUS
  Administered 2018-04-04: 11 [IU] via SUBCUTANEOUS

## 2018-03-31 NOTE — Progress Notes (Signed)
NAME:  Terry Peck, MRN:  [id number], DOB:  10/05/1940, LOS: 8 ADMISSION DATE:  03/23/2018, CONSULTATION DATE:  03/23/2018 REFERRING MD:  Dr. 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 S/p tPA and reperfusion. PCCM consulted for ventilation management.  Past Medical History  Pulmonary fibrosis- ( did not want aggressive treatment), HTN, DM, chronic diastolic heart failure   Significant Hospital Events   12/1 > Admitted for weakness and aphasia. Found with left ICA and Left M2 occlusion s/p tPA and revascularization 12/2 > Extubated without complication 73/7 > Worsening respiratory status requiring re-intubation. Patient self-extubated and required emergent re-intubation 12/6 Extubated to BiPAP 12/7 Transitioned to humidified high flow 12/8 Tolerating [place]  Consults:  Neurology 12/1 PCCM 12/1  Urology 12/3  Procedures:  --ETT 12/1 >> 12/2, 12/3 >> 12/6 --Reintubated 12/3>>Self-extubated on same day>>Reintubated for hypoxemia  Significant Diagnostic Tests:  CT Head 12/1 > 1. Hyperdense terminal left internal carotid artery and left MCA compatible with thrombosis. No evidence of acute infarct or Hemorrhage Atrophy and chronic microvascular ischemic changes in the white matter. CTA Head/Neck 12/1 > 1. 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 Reconstitution of the supraclinoid left internal carotid artery through the anterior communicating artery. There is clot in the left M1 and M2 segments with decreased blood flow. There is occlusion of the posterior division of the left middle cerebral artery due to Clot. 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 Moderate stenosis origin of left vertebral artery Probable fetal origin left posterior cerebral artery which  is supplied from a small hypoplastic left P1 segment.  Chest x-ray 03/27/2018- Cardiomegaly, mild vascular congestion. TTE 12/3 EF 65-70%. PA peak pressure:54 mmHg I reviewed the images personally.  Micro Data:  Tracheal Asp 12/1, 12/3 >> Normal Resp flore  Antimicrobials:  Levofloxacin 12/3 >>12/7  Interim history/subjective:  No complaints.  Continues on HF nasal cannula. Walking around the unit today with physical therapy.  Objective   Blood pressure (!) 93/51, pulse 78, temperature 98.4 F (36.9 C), temperature source Axillary, resp. rate 17, height 5\' 7"  (1.702 m), weight 84.2 kg, SpO2 98 %.        Intake/Output Summary (Last 24 hours) at 03/31/2018 1029 Last data filed at 03/31/2018 0800 Gross per 24 hour  Intake 549.43 ml  Output 1850 ml  Net -1300.57 ml   Filed Weights   03/26/18 0500 03/27/18 0500 03/28/18 0500  Weight: 74.1 kg 74 kg 84.2 kg    Examination: Gen:      Chronically ill-appearing HEENT:  EOMI, sclera anicteric Neck:     No masses; no thyromegaly Lungs:    Bibasilar crackles. CV:         Regular rate and rhythm; no murmurs Abd:      + bowel sounds; soft, non-tender; no palpable masses, no distension Ext:    No edema; adequate peripheral perfusion Skin:      Warm and dry; no rash Neuro: Awake, alert.  Moves all 4 extremities.  Resolved Hospital Problem list   Hyperkalemia Hematuria secondary to traumatic Foley in setting of TPA Hypotension secondary to sedation  Assessment & Plan:   #Acute on chronic hypoxemic respiratory failure #Pulmonary Fibrosis Exacerbation #Suspected pulmonary edema #Hx of Aspiration  Dr. Lenna Gilford is his pulmonologist. On 2 L via [place] at home. Recent  TTE suggestive of PH. 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 Plan  Wean down O2 as tolerated Pulmicort, Brovana.  Albuterol as needed Finished course of solumedrol and lefofloxacin PPI for acid reflux. Bipap as needed at night.  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 BP goal <140 Continue ASA PT/OT-recommends inpatient rehab on discharge  #HTN #S/P Bi-V pacer/ICD, Followed by [facility]  #H/O G1 Diastolic HF, HLD Plan  Telemetry BP goal less than 140 post procedure per neurology Continue home Lipitor  Continue Coreg Hold Cozaar, lasix for today  DM Plan  Trend glucose SSI. Change to AC/HS  Best practice:  Diet: Dysphagia diet Pain/Anxiety/Delirium protocol: NA VAP protocol: NA DVT prophylaxis: SCD, Lovenox GI prophylaxis: PPI Glucose control: SSI Mobility: PT/OT Code Status: Partial code. No intubation Family Communication: No family at bedside 12/9 Disposition: To stepdown.  Labs   I reviewed the following labs myself:  CBC: Recent Labs  Lab 03/25/18 1648 03/26/18 0423 03/27/18 0308 03/28/18 0529 03/29/18 0647  WBC 13.9* 13.3* 12.8* 9.8 9.8  HGB 11.2* 10.9* 10.4* 10.4* 10.4*  HCT 35.5* 35.0* 33.1* 32.8* 32.6*  MCV 93.2 92.6 91.4 92.1 91.8  PLT 203 183 174 170 619    Basic Metabolic Panel: Recent Labs  Lab 03/25/18 1648 03/26/18 0423 03/26/18 1320 03/26/18 1639 03/27/18 0308 03/27/18 1659 03/28/18 0529 03/29/18 0647  NA 140 139  --   --  139  --  141 145  K 3.6 4.0  --   --  3.5  --  3.6 3.5  CL 107 104  --   --  105  --  106 110  CO2 25 22  --   --  24  --  22 28  GLUCOSE 113* 233*  --   --  236*  --  250* 115*  BUN 15 17  --   --  23  --  25* 26*  CREATININE 1.00 0.87  --   --  0.66  --  0.68 0.85  CALCIUM 8.5* 8.7*  --   --  8.7*  --  8.4* 8.4*  MG  --   --  1.9 2.0 2.1 2.2  --   --   PHOS  --   --  2.2* 2.1* 1.7* 2.5  --   --    GFR: Estimated Creatinine Clearance: 75.5 mL/min (by C-G formula based on SCr of 0.85 mg/dL). Recent Labs  Lab 03/26/18 0423 03/27/18 0308 03/28/18 0529 03/29/18 0647  WBC 13.3* 12.8* 9.8 9.8    ABG    Component Value Date/Time   PHART 7.403 03/26/2018 0407   PCO2ART 38.3 03/26/2018  0407   PO2ART 73.0 (L) 03/26/2018 0407   HCO3 23.9 03/26/2018 0407   TCO2 25 03/26/2018 0407   ACIDBASEDEF 1.0 03/26/2018 0407   O2SAT 95.0 03/26/2018 0407    CBG: Recent Labs  Lab 03/30/18 1555 03/30/18 1933 03/30/18 2318 03/31/18 0333 03/31/18 0753  GLUCAP 141* 192* 295* 184* 185*   Chosen Geske MD [facility] Pager [phone] 03/31/2018, 10:29 AM

## 2018-03-31 NOTE — Progress Notes (Signed)
Nutrition Follow-up  DOCUMENTATION CODES:   Not applicable  INTERVENTION:   Magic cup TID with meals, each supplement provides 290 kcal and 9 grams of protein   NUTRITION DIAGNOSIS:   Increased nutrient needs related to post-op healing as evidenced by estimated needs. Ongoing.   GOAL:   Patient will meet greater than or equal to 90% of their needs Progressing.   MONITOR:   PO intake, Supplement acceptance, Diet advancement  ASSESSMENT:   Patient with PMH significant for HTN, DM, pulmonary fibrosis, HLD, CHF who presented to [facility] ED as a code stroke with c/o of right facial droop, right sided weakness and aphasia. Found to have left ICA and left MCA.   Spoke with RN, pt consuming 100% of his meals and beverages.   No bm yet, getting laxatives Pt is very HOH.   12/6 extubated  CBG (last 3)  Recent Labs    03/31/18 0333 03/31/18 0753 03/31/18 1128  GLUCAP 184* 185* 228*    Diet Order:   Diet Order            DIET DYS 2 Room service appropriate? Yes; Fluid consistency: Honey Thick  Diet effective now              EDUCATION NEEDS:   Not appropriate for education at this time  Skin:  Skin Assessment: Skin Integrity Issues: Skin Integrity Issues:: Incisions Incisions: IR sheath   Last BM:     Height:   Ht Readings from Last 1 Encounters:  03/23/18 5\' 7"  (1.702 m)    Weight:   Wt Readings from Last 1 Encounters:  03/28/18 84.2 kg    Ideal Body Weight:  67.3 kg  BMI:  Body mass index is 29.07 kg/m.  Estimated Nutritional Needs:   Kcal:  1750-2000  Protein:  85-100 grams  Fluid:  > 1.7 L/day  Maylon Peppers RD, LDN, CNSC [phone] Pager [phone] After Hours Pager

## 2018-03-31 NOTE — Consult Note (Signed)
Physical Medicine and Rehabilitation Consult   Reason for Consult: Stroke with functional deficits.  Referring Physician: Dr.    Harrison Mons: Terry Peck is a 77 y.o. RH-male with history of NICM s/p ICD,  pulmonary fibrosis--oxygen dependent, T2DM, HTN; who was admitted on 03/23/2018 with right-sided weakness and difficulty speaking.  History taken from chart review.  CT head reviewed, showing left MCA infarct.  Per report, left MCA hyperdense sign with bilateral hygromas.  CTA head/neck showed acute occlusion left internal carotid artery at origin and occlusion and posterior division of left MCA due to clot.  He underwent cerebral angiogram with revascularization of occluded left internal carotid artery and complete revascularization of occluded superior division of left MCA with T1C1 3 revascularization.  ICD interrogated and did not show evidence of A. Fib.  He was extubated without difficulty on 12/2 but required reintubation 12/3 due to worsening of respiratory status.  Patient self extubated and required emergent reintubation that day.  He was extubated to BiPAP 12/6 transited to high flow oxygen and currently respiratory status stable.    Pulmonary fibrosis exacerbation treated with Solu-Medrol as well as Levaquin. He pulled out his Foley on 12/2 with traumatic urethral injury causing gross hematuria and required Foley replacement by Dr. Tresa Moore.  Dr. Leonie Man felt that stroke embolic due to unclear source and ASA increased to 325 mg daily. MBS done and patient started on dysphagia 2, honey liquids.  Confusion resolving with improvement in functional status--continues to require 15 L oxygen via [place] for adequate oxygen isolation.  Patient currently limited by aphasia with decreased awareness of deficits, delayed and processing as well as problems with mobility.  CIR recommended for follow-up therapy.  Review of Systems  Unable to perform ROS: Language   Past Medical History:  Diagnosis Date    . Abnormal ANCA test 07/03/2011  . Allergy   . Arthritis    "mild; right shoulder" (01/29/2012)  . B12 deficiency anemia   . Barrett's esophagus 11/23/2010  . Bundle branch block, left   . Cataract    L eye surgery  . CHF (congestive heart failure) ([place])    pacemaker  . Chronic systolic dysfunction of left ventricle    EF 30%  . Colon polyps 11/23/2010  . GERD (gastroesophageal reflux disease)   . Hyperlipidemia 10/14/2012  . Hypertension   . ICD (implantable cardiac defibrillator) in place   . Iron deficiency anemia 05/30/2011  . Liver abscess 1980's  . Nonischemic cardiomyopathy ([place])    s/p St. Jude BiV ICD 01/29/12  . Pulmonary fibrosis ([place])   . Pulmonary nodules   . Shortness of breath    "related to heart being out of rhythm" (01/29/2012)  . Type II diabetes mellitus ([place])     Past Surgical History:  Procedure Laterality Date  . abscess drained  ?1980's   liver  . BI-VENTRICULAR IMPLANTABLE CARDIOVERTER DEFIBRILLATOR N/A 01/29/2012   Procedure: BI-VENTRICULAR IMPLANTABLE CARDIOVERTER DEFIBRILLATOR  (CRT-D);  Surgeon: Thompson Grayer, MD;  Location: [facility] CATH LAB;  Service: Cardiovascular;  Laterality: N/A;  . CARDIAC DEFIBRILLATOR PLACEMENT  01/29/2012   SJM Quadra Assura BiV ICD implanted by Dr Rayann Heman  . CATARACT EXTRACTION Left   . COLONOSCOPY    . IR CT HEAD LTD  03/23/2018  . IR PERCUTANEOUS ART THROMBECTOMY/INFUSION INTRACRANIAL INC DIAG ANGIO  03/23/2018  . RADIOLOGY WITH ANESTHESIA N/A 03/23/2018   Procedure: RADIOLOGY WITH ANESTHESIA;  Surgeon: Luanne Bras, MD;  Location: [place];  Service: Radiology;  Laterality: N/A;  . TONSILLECTOMY  1952  . UPPER GI ENDOSCOPY      Family History  Problem Relation Age of Onset  . Heart disease Mother   . Colon cancer Neg Hx   . Esophageal cancer Neg Hx   . Stomach cancer Neg Hx     Social History:  Married. Independent with walker ? Per reports he quit smoking about 29 years ago. His smoking use included cigarettes. He has a  5.00 pack-year smoking history. He has never used smokeless tobacco.  Per reports -- he does not drink alcohol or use drugs.   Allergies  Allergen Reactions  . Penicillins Anaphylaxis and Other (See Comments)    "swelling of eyes; throat; could breath good" (01/29/2012) Has patient had a PCN reaction causing immediate rash, facial/tongue/throat swelling, SOB or lightheadedness with hypotension: Yes Has patient had a PCN reaction causing severe rash involving mucus membranes or skin necrosis: Unknown Has patient had a PCN reaction that required hospitalization: Unknown Has patient had a PCN reaction occurring within the last 10 years: No If all of the above answers are "NO", then may proceed with Cephalospor  . Iodine Other (See Comments)    "if I get iodine dye, I'll get a fever" (01/29/2012)  . Fish Oil Itching    Pt said only when given through IV    Medications Prior to Admission  Medication Sig Dispense Refill  . aspirin EC 81 MG tablet Take 81 mg by mouth daily.    Marland Kitchen atorvastatin (LIPITOR) 20 MG tablet TAKE 1 TABLET BY MOUTH ONCE DAILY (Patient taking differently: Take 20 mg by mouth daily. ) 90 tablet 3  . carvedilol (COREG) 25 MG tablet TAKE 1 TABLET BY MOUTH TWICE DAILY WITH MEALS (Patient taking differently: Take 25 mg by mouth 2 (two) times daily with a meal. ) 180 tablet 3  . ibuprofen (ADVIL,MOTRIN) 200 MG tablet Take 200-300 mg by mouth 2 (two) times daily as needed for mild pain.     . Insulin Glargine (LANTUS SOLOSTAR) 100 UNIT/ML Solostar Pen INJECT 50 UNITS SUBCUTANEOUSLY ONCE DAILY (Patient taking differently: Inject 50 Units into the skin daily. ) 5 pen 11  . Lutein 20 MG CAPS Take 20 mg by mouth daily.    . metFORMIN (GLUCOPHAGE-XR) 500 MG 24 hr tablet Take 4 tablets (2,000 mg total) by mouth daily with breakfast. 360 tablet 3  . methocarbamol (ROBAXIN) 500 MG tablet TAKE ONE TABLET BY MOUTH ONCE DAILY AS NEEDED (Patient taking differently: Take 500 mg by mouth daily as  needed for muscle spasms. ) 30 tablet 0  . Multiple Vitamin (MULTIVITAMIN WITH MINERALS) TABS Take 1 tablet by mouth daily.    Marland Kitchen omeprazole (PRILOSEC) 20 MG capsule TAKE 1 CAPSULE BY MOUTH TWICE DAILY (Patient taking differently: Take 20 mg by mouth 2 (two) times daily before a meal. ) 180 capsule 3  . pioglitazone (ACTOS) 30 MG tablet TAKE 1 TABLET BY MOUTH ONCE DAILY (Patient taking differently: Take 30 mg by mouth daily. ) 90 tablet 3  . budesonide (PULMICORT) 180 MCG/ACT inhaler Inhale 2 puffs into the lungs 2 (two) times daily. (Patient not taking: Reported on 03/25/2018) 1 Inhaler 6  . Calcium-Magnesium-Zinc 1000-400-15 MG TABS Take 1 tablet by mouth daily.     . Cholecalciferol (VITAMIN D) 1000 UNITS capsule Take 1,000 Units by mouth daily.     . Cyanocobalamin (B-12 PO) Take 1 tablet by mouth daily.     . Insulin Pen Needle (RELION  PEN NEEDLES) 32G X 4 MM MISC Use as directed once daily E11.9 100 each 3  . OMEGA 3 1000 MG CAPS Take 1,000 mg by mouth 2 (two) times daily.       Home: Home Living Family/patient expects to be discharged to:: Private residence Living Arrangements: Spouse/significant other Available Help at Discharge: Family, Available 24 hours/day Type of Home: House Home Access: Stairs to enter Technical brewer of Steps: 5 Entrance Stairs-Rails: None Home Layout: One level Bathroom Shower/Tub: Chiropodist: [place]: Other (comment)(oxgyen )  Functional History: Prior Function Level of Independence: Independent Comments: driving, 7-8E oxygen most of the time   Functional Status:  Mobility: Bed Mobility Overal bed mobility: Needs Assistance Bed Mobility: Supine to Sit Supine to sit: Min assist General bed mobility comments: mod directional verbal cues, pt intitiated and completed supine to sit but didnt use R UE functionally, v/c's to remind pt to use R UE, pt able to return to supine without assist, and scoot self up in  bed with directional v/c's Transfers Overall transfer level: Needs assistance Equipment used: 1 person hand held assist Transfers: Sit to/from Stand Sit to Stand: Min assist General transfer comment: pt stood at bed side via pushing up from bed, pt with mild retropulsion initially but able to steady self( ) Ambulation/Gait Ambulation/Gait assistance: Min assist, +2 safety/equipment Gait Distance (Feet): 200 Feet Assistive device: Rolling walker (2 wheeled) Gait Pattern/deviations: Step-through pattern, Decreased stride length, Narrow base of support General Gait Details: initially ambulated without AD however pt with bilat knee flexion, near crossover gait and antalgic gait pattern requiring modA to maintain balance. pt given walker, pt more steady with increased step length and less antalgia. pt cont to have narrow base of support requiring freq v/c's to spread LEs apart Gait velocity: dec Gait velocity interpretation: <1.31 ft/sec, indicative of household ambulator    ADL: ADL Overall ADL's : Needs assistance/impaired Eating/Feeding: NPO Grooming: Moderate assistance, Sitting Upper Body Bathing: Moderate assistance, Sitting Lower Body Bathing: Total assistance, +2 for physical assistance, Sit to/from stand Upper Body Dressing : Total assistance, Sitting Lower Body Dressing: Total assistance, +2 for physical assistance, Sit to/from stand Toilet Transfer Details (indicate cue type and reason): deferred Functional mobility during ADLs: Moderate assistance, +2 for physical assistance, +2 for safety/equipment General ADL Comments: pt limited by generalized weakness, cognition, and decreased activity tolerance  Cognition: Cognition Overall Cognitive Status: Impaired/Different from baseline Orientation Level: Disoriented to place, Disoriented to time, Disoriented to situation, Oriented to person Cognition Arousal/Alertness: Awake/alert Behavior During Therapy: WFL for tasks  assessed/performed Overall Cognitive Status: Impaired/Different from baseline Area of Impairment: Memory, Following commands, Safety/judgement, Awareness, Problem solving Memory: Decreased short-term memory Following Commands: Follows one step commands with increased time, Follows one step commands inconsistently Safety/Judgement: Decreased awareness of safety, Decreased awareness of deficits Awareness: Emergent Problem Solving: Slow processing, Difficulty sequencing, Requires verbal cues, Requires tactile cues General Comments: pt cont to have word finding difficulties. Pt unable to state name of visitor. Pt with mild R sided neglect, difficulty following L/R directions delayed processing Difficult to assess due to: Intubated  Blood pressure (!) 114/55, pulse (!) 59, temperature 98 F (36.7 C), temperature source Oral, resp. rate 16, height 5\' 7"  (1.702 m), weight 75.9 kg, SpO2 100 %. Physical Exam  Nursing note and vitals reviewed. Constitutional: He appears well-developed and well-nourished.   10 L high flow oxygen per [place].  HENT:  Head: Normocephalic and atraumatic.  Eyes: EOM are normal. Right  eye exhibits no discharge. Left eye exhibits no discharge.  Neck: Normal range of motion. Neck supple.  Cardiovascular:  Cardiac rate controlled  Respiratory: Effort normal and breath sounds normal.  GI: Soft. Bowel sounds are normal.  Musculoskeletal:  No edema or tenderness in extremities  Neurological: He is alert.  Unable to answer basic Y/N questions consistently and verbal output consists of jargon.  Global aphasia  he was able to follow simple motor commands with visual/tactile cues.   Motor: Right upper extremity/right lower extremity:?  4+/5 proximal to distal Left upper extremity/left lower extremity:?  4-/5 proximal and distal (leg weaker than arm)  Skin:  Vascular changes bilateral lower extremities  Psychiatric:  Unable to assess due to mentation    Results for orders  placed or performed during the hospital encounter of 03/23/18 (from the past 24 hour(s))  Glucose, capillary     Status: Abnormal   Collection Time: 03/31/18 11:28 AM  Result Value Ref Range   Glucose-Capillary 228 (H) 70 - 99 mg/dL   Comment 1 Notify RN    Comment 2 Document in Chart   Glucose, capillary     Status: Abnormal   Collection Time: 03/31/18  4:11 PM  Result Value Ref Range   Glucose-Capillary 273 (H) 70 - 99 mg/dL  Glucose, capillary     Status: Abnormal   Collection Time: 03/31/18  9:36 PM  Result Value Ref Range   Glucose-Capillary 372 (H) 70 - 99 mg/dL   Comment 1 Notify RN    Comment 2 Document in Chart   Basic metabolic panel     Status: Abnormal   Collection Time: 04/01/18  6:05 AM  Result Value Ref Range   Sodium 143 135 - 145 mmol/L   Potassium 3.2 (L) 3.5 - 5.1 mmol/L   Chloride 103 98 - 111 mmol/L   CO2 29 22 - 32 mmol/L   Glucose, Bld 188 (H) 70 - 99 mg/dL   BUN 31 (H) 8 - 23 mg/dL   Creatinine, Ser 0.79 0.61 - 1.24 mg/dL   Calcium 8.5 (L) 8.9 - 10.3 mg/dL   GFR calc non Af Amer >60 >60 mL/min   GFR calc Af Amer >60 >60 mL/min   Anion gap 11 5 - 15  CBC     Status: Abnormal   Collection Time: 04/01/18  6:05 AM  Result Value Ref Range   WBC 16.3 (H) 4.0 - 10.5 K/uL   RBC 3.77 (L) 4.22 - 5.81 MIL/uL   Hemoglobin 11.0 (L) 13.0 - 17.0 g/dL   HCT 35.0 (L) 39.0 - 52.0 %   MCV 92.8 80.0 - 100.0 fL   MCH 29.2 26.0 - 34.0 pg   MCHC 31.4 30.0 - 36.0 g/dL   RDW 14.7 11.5 - 15.5 %   Platelets 245 150 - 400 K/uL   nRBC 0.0 0.0 - 0.2 %  Magnesium     Status: None   Collection Time: 04/01/18  6:05 AM  Result Value Ref Range   Magnesium 2.0 1.7 - 2.4 mg/dL  Phosphorus     Status: None   Collection Time: 04/01/18  6:05 AM  Result Value Ref Range   Phosphorus 3.2 2.5 - 4.6 mg/dL  Glucose, capillary     Status: Abnormal   Collection Time: 04/01/18  6:39 AM  Result Value Ref Range   Glucose-Capillary 173 (H) 70 - 99 mg/dL   Comment 1 Notify RN    Comment 2  Document in Chart  Dg Swallowing Func-speech Pathology  Result Date: 03/30/2018 Objective Swallowing Evaluation: Type of Study: MBS-Modified Barium Swallow Study  Patient Details Name: Terry Peck MRN: [id number] Date of Birth: [birth year] Today's Date: 03/30/2018 Time: SLP Start Time (ACUTE ONLY): 9767 -SLP Stop Time (ACUTE ONLY): 1410 SLP Time Calculation (min) (ACUTE ONLY): 25 min Past Medical History: Past Medical History: Diagnosis Date . Abnormal ANCA test 07/03/2011 . Allergy  . Arthritis   "mild; right shoulder" (01/29/2012) . B12 deficiency anemia  . Barrett's esophagus 11/23/2010 . Bundle branch block, left  . Cataract   L eye surgery . CHF (congestive heart failure) ([place])   pacemaker . Chronic systolic dysfunction of left ventricle   EF 30% . Colon polyps 11/23/2010 . GERD (gastroesophageal reflux disease)  . Hyperlipidemia 10/14/2012 . Hypertension  . ICD (implantable cardiac defibrillator) in place  . Iron deficiency anemia 05/30/2011 . Liver abscess 1980's . Nonischemic cardiomyopathy ([place])   s/p St. Jude BiV ICD 01/29/12 . Pulmonary fibrosis ([place])  . Pulmonary nodules  . Shortness of breath   "related to heart being out of rhythm" (01/29/2012) . Type II diabetes mellitus ([place])  Past Surgical History: Past Surgical History: Procedure Laterality Date . abscess drained  ?1980's  liver . BI-VENTRICULAR IMPLANTABLE CARDIOVERTER DEFIBRILLATOR N/A 01/29/2012  Procedure: BI-VENTRICULAR IMPLANTABLE CARDIOVERTER DEFIBRILLATOR  (CRT-D);  Surgeon: Thompson Grayer, MD;  Location: [facility] CATH LAB;  Service: Cardiovascular;  Laterality: N/A; . CARDIAC DEFIBRILLATOR PLACEMENT  01/29/2012  SJM Quadra Assura BiV ICD implanted by Dr Rayann Heman . CATARACT EXTRACTION Left  . COLONOSCOPY   . IR CT HEAD LTD  03/23/2018 . IR PERCUTANEOUS ART THROMBECTOMY/INFUSION INTRACRANIAL INC DIAG ANGIO  03/23/2018 . RADIOLOGY WITH ANESTHESIA N/A 03/23/2018  Procedure: RADIOLOGY WITH ANESTHESIA;  Surgeon: Luanne Bras, MD;  Location: [place];  Service:  Radiology;  Laterality: N/A; . TONSILLECTOMY  1952 . UPPER GI ENDOSCOPY   HPI: KEISHAWN RAJEWSKI is an 77 y.o. male HTN, DM, pulmonary fibrosis, HLD, CHF who presented to [facility] ED as a code stroke with c/o of right facial droop, right sided weakness and aphasia. Patient was intubated for airway protection on 12/1. Extubated 12/2. Reintubated 12/3 due to respiratory decompensation. Self extubated 12/3 and emergently reintubated. Extubated 12/6 to Bipap, family changed status to DNI.  Subjective: alert, cooperative, aphasic Assessment / Plan / Recommendation CHL IP CLINICAL IMPRESSIONS 03/30/2018 Clinical Impression No cough response, cued cough, but ineffective to clear. With nectar thick liquids similarly the swallow was initiated at the pyriform sinuses, penetration and aspiration (although a slightly smaller volume) during the swallow. No cough response by patient, verbally cued by SLP,  but ineffective to clear. REduced laryngeal elevation and incomplete closure of larynx. However with a verbal and tactile cue patient implemented a chin tuck resulting in no penetration/aspiratin with nectar thickened liquids. Honey thickened liquids, purees and solids, the swallow was triggered at the valleculae with no penetration/aspriation noted. Despite command to swallow pill whole, pt chewed pill in applesauce and it passed easily through the pharynx and upper esophagus. Recommend Dys 2 diet with honey thickened liquids. Medications given whole in puree. Dysphagia therapy to include training to use chin tuck consistently, continued evaluation for diet upgrade (repeat MBS due to silent aspiration) and education with patient and family on aspiration precautions.  SLP Visit Diagnosis Dysphagia, oropharyngeal phase (R13.12) Attention and concentration deficit following -- Frontal lobe and executive function deficit following -- Impact on safety and function Moderate aspiration risk   CHL IP TREATMENT RECOMMENDATION 03/29/2018  Treatment Recommendations F/U MBS in --- days (Comment);Defer treatment plan to f/u with SLP   Prognosis 03/30/2018 Prognosis for Safe Diet Advancement Fair Barriers to Reach Goals Cognitive deficits;Severity of deficits Barriers/Prognosis Comment -- CHL IP DIET RECOMMENDATION 03/30/2018 SLP Diet Recommendations Dysphagia 2 (Fine chop) solids;Honey thick liquids Liquid Administration via Cup Medication Administration Whole meds with puree Compensations Minimize environmental distractions;Slow rate;Small sips/bites Postural Changes Remain semi-upright after after feeds/meals (Comment);Seated upright at 90 degrees   CHL IP OTHER RECOMMENDATIONS 03/30/2018 Recommended Consults -- Oral Care Recommendations Oral care QID Other Recommendations Order thickener from pharmacy   CHL IP FOLLOW UP RECOMMENDATIONS 03/30/2018 Follow up Recommendations Inpatient Rehab   CHL IP FREQUENCY AND DURATION 03/30/2018 Speech Therapy Frequency (ACUTE ONLY) min 2x/week Treatment Duration 2 weeks      CHL IP ORAL PHASE 03/30/2018 Oral Phase Impaired Oral - Pudding Teaspoon Incomplete tongue to palate contact;Reduced posterior propulsion;Piecemeal swallowing Oral - Pudding Cup -- Oral - Honey Teaspoon -- Oral - Honey Cup Incomplete tongue to palate contact;Lingual/palatal residue;Piecemeal swallowing;Reduced posterior propulsion Oral - Nectar Teaspoon -- Oral - Nectar Cup Weak lingual manipulation;Incomplete tongue to palate contact;Lingual/palatal residue;Piecemeal swallowing Oral - Nectar Straw -- Oral - Thin Teaspoon -- Oral - Thin Cup Weak lingual manipulation;Incomplete tongue to palate contact;Lingual/palatal residue;Piecemeal swallowing Oral - Thin Straw -- Oral - Puree -- Oral - Mech Soft -- Oral - Regular -- Oral - Multi-Consistency -- Oral - Pill -- Oral Phase - Comment --  CHL IP PHARYNGEAL PHASE 03/30/2018 Pharyngeal Phase Impaired Pharyngeal- Pudding Teaspoon Delayed swallow initiation-vallecula;Reduced laryngeal elevation;Reduced  airway/laryngeal closure;Reduced tongue base retraction Pharyngeal -- Pharyngeal- Pudding Cup -- Pharyngeal -- Pharyngeal- Honey Teaspoon -- Pharyngeal -- Pharyngeal- Honey Cup Delayed swallow initiation-vallecula;Reduced laryngeal elevation;Reduced airway/laryngeal closure;Reduced tongue base retraction Pharyngeal -- Pharyngeal- Nectar Teaspoon -- Pharyngeal -- Pharyngeal- Nectar Cup Delayed swallow initiation-pyriform sinuses;Reduced laryngeal elevation;Reduced airway/laryngeal closure;Reduced tongue base retraction;Penetration/Aspiration during swallow;Moderate aspiration Pharyngeal Material enters airway, passes BELOW cords without attempt by patient to eject out (silent aspiration) Pharyngeal- Nectar Straw -- Pharyngeal -- Pharyngeal- Thin Teaspoon -- Pharyngeal -- Pharyngeal- Thin Cup Delayed swallow initiation-pyriform sinuses;Reduced laryngeal elevation;Reduced airway/laryngeal closure;Reduced tongue base retraction;Penetration/Aspiration during swallow;Moderate aspiration Pharyngeal Material enters airway, passes BELOW cords without attempt by patient to eject out (silent aspiration) Pharyngeal- Thin Straw -- Pharyngeal -- Pharyngeal- Puree -- Pharyngeal -- Pharyngeal- Mechanical Soft -- Pharyngeal -- Pharyngeal- Regular -- Pharyngeal -- Pharyngeal- Multi-consistency -- Pharyngeal -- Pharyngeal- Pill -- Pharyngeal -- Pharyngeal Comment --  CHL IP CERVICAL ESOPHAGEAL PHASE 03/30/2018 Cervical Esophageal Phase WFL Pudding Teaspoon -- Pudding Cup -- Honey Teaspoon -- Honey Cup -- Nectar Teaspoon -- Nectar Cup -- Nectar Straw -- Thin Teaspoon -- Thin Cup -- Thin Straw -- Puree -- Mechanical Soft -- Regular -- Multi-consistency -- Pill -- Cervical Esophageal Comment -- Charlynne Cousins Ward, MA, CCC-SLP 03/30/2018 2:27 PM               Assessment/Plan: Diagnosis: Left MCA infarct with bilateral hygromas Labs and images (see above) independently reviewed.  Records reviewed and summated above. Stroke: Continue  secondary stroke prophylaxis and Risk Factor Modification listed below:   Antiplatelet therapy:   Blood Pressure Management:  Continue current medication with prn's with permisive HTN per primary team Statin Agent:   Diabetes management:   Right sided hemiparesis Motor recovery: Fluoxetine  1. Does the need for close, 24 hr/day medical supervision in concert with the patient's rehab needs make it unreasonable for this patient to be served in a less intensive  setting? Yes  2. Co-Morbidities requiring supervision/potential complications: Post stroke dysphagia (advance diet as tolerated), NICM s/p ICD,  pulmonary fibrosis--oxygen dependent, T2 DM (Monitor in accordance with exercise and adjust meds as necessary), HTN (monitor and provide prns in accordance with increased physical exertion and pain), hypokalemia (continue to monitor and replete as necessary) 3. Due to bladder management, bowel management, safety, disease management, medication administration and patient education, does the patient require 24 hr/day rehab nursing? Yes 4. Does the patient require coordinated care of a physician, rehab nurse, PT (1-2 hrs/day, 5 days/week), OT (1-2 hrs/day, 5 days/week) and SLP (1-2 hrs/day, 5 days/week) to address physical and functional deficits in the context of the above medical diagnosis(es)? Yes Addressing deficits in the following areas: balance, endurance, locomotion, strength, transferring, bowel/bladder control, bathing, dressing, grooming, toileting, cognition, speech, language, swallowing and psychosocial support 5. Can the patient actively participate in an intensive therapy program of at least 3 hrs of therapy per day at least 5 days per week? Potentially 6. The potential for patient to make measurable gains while on inpatient rehab is excellent 7. Anticipated functional outcomes upon discharge from inpatient rehab are supervision  with PT, supervision with OT, min assist and mod assist with  SLP. 8. Estimated rehab length of stay to reach the above functional goals is: 10-13 days. 9. Anticipated D/C setting: Other 10. Anticipated post D/C treatments: HH therapy and Home excercise program 11. Overall Rehab/Functional Prognosis: good  RECOMMENDATIONS: This patient's condition is appropriate for continued rehabilitative care in the following setting: Will need to inquire about caregiver support at discharge as patient will likely require supervision level of assistance.  If available, recommend CIR if patient able to tolerate 3 hours of therapy per day from a pulmonary standpoint. Patient has agreed to participate in recommended program. Potentially Note that insurance prior authorization may be required for reimbursement for recommended care.  Comment: Rehab Admissions Coordinator to follow up.   I have personally performed a face to face diagnostic evaluation, including, but not limited to relevant history and physical exam findings, of this patient and developed relevant assessment and plan.  Additionally, I have reviewed and concur with the physician assistant's documentation above.   Delice Lesch, MD, ABPMR Bary Leriche, PA-C 04/01/2018

## 2018-03-31 NOTE — Progress Notes (Signed)
Physical Therapy Treatment Patient Details Name: Terry Peck MRN: [id number] DOB: February 26, 1941 Today's Date: 03/31/2018    History of Present Illness Terry Peck is an 77 y.o. male HTN, DM, pulmonary fibrosis, HLD, CHF who presented to [facility] ED as a code stroke with c/o of right facial droop, right sided weakness and aphasia. 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 Intubated 12/1, extubated 12/2, then re-intubated 12/3, self extubated later on 12/3, emergently re-intubated on 12/3.Marland Kitchen Pt extubated on 12/6    PT Comments    Pt much improved medically and functionally since PT eval last Wednesday. Pt able to tolerate ambulation on 15LO2 via [place], SpO2 in 80s. Pt con't to have aphasia, word finding difficulties, delayed processing, and impaired commands follow. Pt to cont to benefit from CIR upon d/c to address mentioned deficits as pt was indep PTA.    Follow Up Recommendations  CIR     Equipment Recommendations  (TBD)    Recommendations for Other Services Rehab consult     Precautions / Restrictions Precautions Precautions: Fall Precaution Comments: aphasia Restrictions Weight Bearing Restrictions: No    Mobility  Bed Mobility Overal bed mobility: Needs Assistance Bed Mobility: Supine to Sit     Supine to sit: Min assist     General bed mobility comments: mod directional verbal cues, pt intitiated and completed supine to sit but didnt use R UE functionally, v/c's to remind pt to use R UE, pt able to return to supine without assist, and scoot self up in bed with directional v/c's  Transfers Overall transfer level: Needs assistance Equipment used: 1 person hand held assist Transfers: Sit to/from Stand Sit to Stand: Min assist         General transfer comment: pt stood at bed side via pushing up from bed, pt with mild retropulsion initially but able to steady self( )  Ambulation/Gait Ambulation/Gait assistance: Min assist;+2 safety/equipment Gait Distance  (Feet): 200 Feet Assistive device: Rolling walker (2 wheeled) Gait Pattern/deviations: Step-through pattern;Decreased stride length;Narrow base of support Gait velocity: dec Gait velocity interpretation: <1.31 ft/sec, indicative of household ambulator General Gait Details: initially ambulated without AD however pt with bilat knee flexion, near crossover gait and antalgic gait pattern requiring modA to maintain balance. pt given walker, pt more steady with increased step length and less antalgia. pt cont to have narrow base of support requiring freq v/c's to spread LEs apart   Stairs             Wheelchair Mobility    Modified Rankin (Stroke Patients Only) Modified Rankin (Stroke Patients Only) Pre-Morbid Rankin Score: No symptoms Modified Rankin: Moderately severe disability     Balance Overall balance assessment: Needs assistance Sitting-balance support: Feet supported;Bilateral upper extremity supported Sitting balance-Leahy Scale: Good     Standing balance support: Bilateral upper extremity supported;During functional activity Standing balance-Leahy Scale: Poor Standing balance comment: dependent on B UE and external support                            Cognition Arousal/Alertness: Awake/alert Behavior During Therapy: WFL for tasks assessed/performed Overall Cognitive Status: Impaired/Different from baseline Area of Impairment: Memory;Following commands;Safety/judgement;Awareness;Problem solving                     Memory: Decreased short-term memory Following Commands: Follows one step commands with increased time;Follows one step commands inconsistently Safety/Judgement: Decreased awareness of safety;Decreased awareness of deficits Awareness: Emergent Problem  Solving: Slow processing;Difficulty sequencing;Requires verbal cues;Requires tactile cues General Comments: pt cont to have word finding difficulties. Pt unable to state name of visitor. Pt  with mild R sided neglect, difficulty following L/R directions delayed processing      Exercises      General Comments General comments (skin integrity, edema, etc.): Pt required 15Lo2 via [place] to maintain SpO2 >80%      Pertinent Vitals/Pain Pain Assessment: No/denies pain    Home Living                      Prior Function            PT Goals (current goals can now be found in the care plan section) Progress towards PT goals: Progressing toward goals    Frequency    Min 4X/week      PT Plan Discharge plan needs to be updated    Co-evaluation              AM-PAC PT "6 Clicks" Mobility   Outcome Measure  Help needed turning from your back to your side while in a flat bed without using bedrails?: A Little Help needed moving from lying on your back to sitting on the side of a flat bed without using bedrails?: A Little Help needed moving to and from a bed to a chair (including a wheelchair)?: A Little Help needed standing up from a chair using your arms (e.g., wheelchair or bedside chair)?: A Little Help needed to walk in hospital room?: A Lot Help needed climbing 3-5 steps with a railing? : A Lot 6 Click Score: 16    End of Session Equipment Utilized During Treatment: Gait belt;Oxygen(15Lo2 via High flow) Activity Tolerance: Patient tolerated treatment well Patient left: in bed;with call bell/phone within reach;with family/visitor present Nurse Communication: Mobility status PT Visit Diagnosis: Unsteadiness on feet (R26.81);Difficulty in walking, not elsewhere classified (R26.2)     Time: 6122-4497 PT Time Calculation (min) (ACUTE ONLY): 23 min  Charges:  $Gait Training: 23-37 mins                     Kittie Plater, PT, DPT Acute Rehabilitation Services Pager #: [phone] Office #: [phone]    Berline Lopes 03/31/2018, 11:59 AM

## 2018-03-31 NOTE — Progress Notes (Signed)
Inpatient Diabetes Program Recommendations  AACE/ADA: New Consensus Statement on Inpatient Glycemic Control (2019)  Target Ranges:  Prepandial:   less than 140 mg/dL      Peak postprandial:   less than 180 mg/dL (1-2 hours)      Critically ill patients:  140 - 180 mg/dL   Results for Terry Peck, Terry Peck (MRN [id number]) as of 03/31/2018 10:21  Ref. Range 03/30/2018 07:48 03/30/2018 11:47 03/30/2018 15:55 03/30/2018 19:33 03/30/2018 23:18 03/31/2018 03:33  Glucose-Capillary Latest Ref Range: 70 - 99 mg/dL 130 (H) 143 (H) 141 (H) 192 (H) 295 (H) 184 (H)   Review of Glycemic Control  Current orders for Inpatient glycemic control: Novolog 0-20 units Q4H; Solumedrol 60 mg daily  Inpatient Diabetes Program Recommendations:  Insulin - Meal Coverage: If steroids are continued, please consider ordering Novolog 3 units TID with meals for meal coverage if patient eats at least 50% of meals.  Thanks, Barnie Alderman, RN, MSN, CDE Diabetes Coordinator Inpatient Diabetes Program [phone] (Team Pager from 8am to 5pm)

## 2018-03-31 NOTE — Progress Notes (Addendum)
STROKE TEAM PROGRESS NOTE   SUBJECTIVE (INTERVAL HISTORY) Sitting up in the chair. Stable respiratory pattern from pulmonary fibrosis. Neurologically no changes. Continues to have mild right-sided weakness   OBJECTIVE Temp:  [97.9 F (36.6 C)-98.9 F (37.2 C)] 98.4 F (36.9 C) (12/09 0800) Pulse Rate:  [61-86] 66 (12/09 0800) Cardiac Rhythm: Ventricular paced (12/09 0800) Resp:  [14-29] 21 (12/09 0800) BP: (73-149)/(38-90) 119/64 (12/09 0700) SpO2:  [74 %-100 %] 96 % (12/09 0850)  Recent Labs  Lab 03/30/18 1555 03/30/18 1933 03/30/18 2318 03/31/18 0333 03/31/18 0753  GLUCAP 141* 192* 295* 184* 185*   Recent Labs  Lab 03/25/18 1648 03/26/18 0423 03/26/18 1320 03/26/18 1639 03/27/18 0308 03/27/18 1659 03/28/18 0529 03/29/18 0647  NA 140 139  --   --  139  --  141 145  K 3.6 4.0  --   --  3.5  --  3.6 3.5  CL 107 104  --   --  105  --  106 110  CO2 25 22  --   --  24  --  22 28  GLUCOSE 113* 233*  --   --  236*  --  250* 115*  BUN 15 17  --   --  23  --  25* 26*  CREATININE 1.00 0.87  --   --  0.66  --  0.68 0.85  CALCIUM 8.5* 8.7*  --   --  8.7*  --  8.4* 8.4*  MG  --   --  1.9 2.0 2.1 2.2  --   --   PHOS  --   --  2.2* 2.1* 1.7* 2.5  --   --    Recent Labs  Lab 03/25/18 1648 03/26/18 0423 03/27/18 0308 03/28/18 0529 03/29/18 0647  WBC 13.9* 13.3* 12.8* 9.8 9.8  HGB 11.2* 10.9* 10.4* 10.4* 10.4*  HCT 35.5* 35.0* 33.1* 32.8* 32.6*  MCV 93.2 92.6 91.4 92.1 91.8  PLT 203 183 174 170 190    No results found.   TTE - Full study not performed as pt requested study to be stopped   prior to completion; only partial parasternal and no   apical/subcostal images obtained; vigorous LV systolic function;   doppler incomplete.  PHYSICAL EXAM per Dr. Madison Hickman - Well nourished, well developed elderly African-American male  Afebrile. Head is nontraumatic. Neck is supple without bruit.    Cardiac exam no murmur or gallop. Lungs are clear to auscultation. Distal  pulses are well felt. Neurological Exam :  -  Awake alert.  . More oriented today   and follows only simple midline and one-step commands.. Eyes move to both directions, no gaze preference. Pupil 40mm bilaterally, reactive to light, not consistently blinking to visual threat bilaterally but attending objects to both sides.  Bilateral corneal and cough reflex present.  Facial symmetric  Moving all extremities  But mild right hemiparesis 4/5. Mild bilateral proximal lower extremity weakness right greater than left.Marland Kitchen DTR 1+ and no babinski bilaterally. Sensation, coordination and gait not tested.    ASSESSMENT/PLAN [name] is a 77 y.o. male with history of CHF on pacer, pulmonary fibrosis, HTN, HLD admitted for right side weakness and aphasia. TPA given. 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 -proximal L ICA occlusion with distal embolization to the MCA  CT head left MCA hyperdense sign, b/l hygroma  CTA head and neck - left ICA and MCA occlusion, left VA origin athero, left fetal PCA patent distally  MRI / MRA not able to perform due to pacemaker  CT repeat left MCA infarct  DSA left ICA and MCA occlusion s/p TICI 3 reperfusion  2D Echo EF 65-70%  Pacemaker interrogation no afib  LDL 47  HgbA1c 7.0  SCDs for VTE prophylaxis  aspirin 81 mg daily prior to admission, now on ASA 325mg .  Therapy recommendations:  CIR. Rehab consulted.  Disposition:  Pending  Acute on chronic hypoxemic Respiratory Failure  Pulmonary Fibrosis Suspected Pulmonary Edema Hx aspiration  Following with pulmonary for PF as outpt  Intubated - extubated- reintubated - self extubated - re-intubated - extubated  On Levaquin for aspiration prevention  CCM on board  CHF with pacer Hx diastolic HF  Followed Dr. Rayann Heman  EF 60-65% in 08/2016  TTE this time EF 65-70%  Pacer  interrogation no afib  Diabetes type II  HgbA1c 7.0,  goal < 7.0  Hyperglycemia continued with TF, on steroids solumedrol 60 daily  Home meds - lantus 50 U daily  Added lantus 15U bid and novolog 3U q4h  CBG monitoring  SSI   DM coordinator following - recommends addition of 3u tid w/ meals if steroids continues with >50% meal intake  Hypotension Hx of hypertension . Treated with levophed . On Coreg 12.5 - continue . Hold cozaar and lasix today  Long term BP goal normotensive  Hyperlipidemia  Home meds:  lipitor 20   LDL 47, goal < 70  Now on lipitor 20  Continue statin at discharge  Gross hematuria, resolved  Likely due to uretal trauma  Difficulty foley insertion - urology put in catheter  Hematuria resolved   Other Stroke Risk Factors  Advanced age  Other Active Problems  Hyperkalemia, resolved 3.5  Leukocytosis, resolved WBC 9.8  Hospital day # Scott, MSN, APRN, ANVP-BC, AGPCNP-BC Advanced Practice Stroke Nurse [employer] for Schedule & Pager information 03/31/2018 2:27 PM  I have personally obtained history,examined this patient, reviewed notes, independently viewed imaging studies, participated in medical decision making and plan of care.ROS completed by me personally and pertinent positives fully documented  I have made any additions or clarifications directly to the above note. Agree with note above.  Continue weaning oxygen as per critical care team. Patient will likely need to be transferred to internal medicine team given his multiple medical comorbidities of CHF, hypertension, respiratory failure once his transfer out of the unit. Discuss with Dr. Janann August CCM M.D. who agrees with plan. This patient is critically ill and at significant risk of neurological worsening, death and care requires constant monitoring of vital signs, hemodynamics,respiratory and cardiac monitoring, extensive review of multiple  databases, frequent neurological assessment, discussion with family, other specialists and medical decision making of high complexity.I have made any additions or clarifications directly to the above note.This critical care time does not reflect procedure time, or teaching time or supervisory time of PA/NP/Med Resident etc but could involve care discussion time.  I spent 30 minutes of neurocritical care time  in the care of  this patient.     Antony Contras, MD Medical Director [facility] Pager: [phone] 03/31/2018 3:01 PM   To contact Stroke Continuity provider, please refer to [email or web address] After hours, contact General Neurology

## 2018-04-01 ENCOUNTER — Inpatient Hospital Stay (HOSPITAL_COMMUNITY): Payer: Medicare Other

## 2018-04-01 DIAGNOSIS — J8410 Pulmonary fibrosis, unspecified: Secondary | ICD-10-CM

## 2018-04-01 DIAGNOSIS — R0902 Hypoxemia: Secondary | ICD-10-CM

## 2018-04-01 DIAGNOSIS — E119 Type 2 diabetes mellitus without complications: Secondary | ICD-10-CM

## 2018-04-01 DIAGNOSIS — I639 Cerebral infarction, unspecified: Secondary | ICD-10-CM

## 2018-04-01 DIAGNOSIS — J9600 Acute respiratory failure, unspecified whether with hypoxia or hypercapnia: Secondary | ICD-10-CM

## 2018-04-01 DIAGNOSIS — E876 Hypokalemia: Secondary | ICD-10-CM

## 2018-04-01 DIAGNOSIS — I69391 Dysphagia following cerebral infarction: Secondary | ICD-10-CM

## 2018-04-01 DIAGNOSIS — I69321 Dysphasia following cerebral infarction: Secondary | ICD-10-CM

## 2018-04-01 DIAGNOSIS — I10 Essential (primary) hypertension: Secondary | ICD-10-CM

## 2018-04-01 LAB — CBC
HCT: 35 % — ABNORMAL LOW (ref 39.0–52.0)
Hemoglobin: 11 g/dL — ABNORMAL LOW (ref 13.0–17.0)
MCH: 29.2 pg (ref 26.0–34.0)
MCHC: 31.4 g/dL (ref 30.0–36.0)
MCV: 92.8 fL (ref 80.0–100.0)
Platelets: 245 10*3/uL (ref 150–400)
RBC: 3.77 MIL/uL — ABNORMAL LOW (ref 4.22–5.81)
RDW: 14.7 % (ref 11.5–15.5)
WBC: 16.3 10*3/uL — ABNORMAL HIGH (ref 4.0–10.5)
nRBC: 0 % (ref 0.0–0.2)

## 2018-04-01 LAB — GLUCOSE, CAPILLARY
Glucose-Capillary: 173 mg/dL — ABNORMAL HIGH (ref 70–99)
Glucose-Capillary: 257 mg/dL — ABNORMAL HIGH (ref 70–99)
Glucose-Capillary: 222 mg/dL — ABNORMAL HIGH (ref 70–99)
Glucose-Capillary: 183 mg/dL — ABNORMAL HIGH (ref 70–99)

## 2018-04-01 LAB — BASIC METABOLIC PANEL
Anion gap: 11 (ref 5–15)
BUN: 31 mg/dL — ABNORMAL HIGH (ref 8–23)
CO2: 29 mmol/L (ref 22–32)
CREATININE: 0.79 mg/dL (ref 0.61–1.24)
Calcium: 8.5 mg/dL — ABNORMAL LOW (ref 8.9–10.3)
Chloride: 103 mmol/L (ref 98–111)
GFR calc Af Amer: >60 mL/min (ref >60)
GFR calc non Af Amer: >60 mL/min (ref >60)
Glucose, Bld: 188 mg/dL — ABNORMAL HIGH (ref 70–99)
Potassium: 3.2 mmol/L — ABNORMAL LOW (ref 3.5–5.1)
Sodium: 143 mmol/L (ref 135–145)

## 2018-04-01 LAB — PHOSPHORUS: Phosphorus: 3.2 mg/dL (ref 2.5–4.6)

## 2018-04-01 LAB — MAGNESIUM: Magnesium: 2 mg/dL (ref 1.7–2.4)

## 2018-04-01 MED ORDER — ENOXAPARIN SODIUM 40 MG/0.4ML ~~LOC~~ SOLN
40.0000 mg | Freq: Every day | SUBCUTANEOUS | Status: DC
  Administered 2018-04-01 – 2018-04-04 (×4): 40 mg via SUBCUTANEOUS
  Filled 2018-04-01 (×4): qty 0.4

## 2018-04-01 MED ORDER — POTASSIUM CHLORIDE CRYS ER 20 MEQ PO TBCR
40.0000 meq | EXTENDED_RELEASE_TABLET | Freq: Once | ORAL | Status: AC
  Administered 2018-04-01: 40 meq via ORAL
  Filled 2018-04-01: qty 2

## 2018-04-01 MED ORDER — POLYETHYLENE GLYCOL 3350 17 G PO PACK
17.0000 g | PACK | Freq: Every day | ORAL | Status: DC
  Administered 2018-04-01 – 2018-04-06 (×6): 17 g via ORAL
  Filled 2018-04-01 (×6): qty 1

## 2018-04-01 NOTE — Progress Notes (Signed)
Inpatient Rehabilitation Admissions Coordinator  I met with patient, his wife, daughter and son at bedside. Daughter states pt's wife unable to care for pt at d/c. Daughter is working to get them both into [place] in [facility] assisted living. She was unaware that the couple was having some difficulties at home. Family feel pt was on 2 to 3 liters [place] at home, but not higher than 5 liters [place]. They would like to pursue an inpt rehab admit if insurance approves but otherwise pt will need SNF rehab with eventual need for ALF placement. I will follow up tomorrow to follow up on his progress with weaning oxygen.  Danne Baxter, RN, MSN Rehab Admissions Coordinator [phone] 04/01/2018 3:58 PM

## 2018-04-01 NOTE — Progress Notes (Addendum)
NAME:  Terry Peck, MRN:  [id number], DOB:  Aug 14, 1940, LOS: 9 ADMISSION DATE:  03/23/2018, CONSULTATION DATE:  03/23/2018 REFERRING MD:  Dr. 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 S/p tPA and reperfusion. PCCM consulted for ventilation management.  Past Medical History  Pulmonary fibrosis- ( did not want aggressive treatment), HTN, DM, chronic diastolic heart failure   Significant Hospital Events   12/1 > Admitted for weakness and aphasia. Found with left ICA and Left M2 occlusion s/p tPA and revascularization 12/2 > Extubated without complication 63/8 > Worsening respiratory status requiring re-intubation. Patient self-extubated and required emergent re-intubation 12/6 Extubated to BiPAP 12/7 Transitioned to humidified high flow 12/8 Tolerating [place] 12/9>> Transferred to 3W rehab 12/10>> Weaning oxygen   Consults:  Neurology 12/1 PCCM 12/1  Urology 12/3  Procedures:  --ETT 12/1 >> 12/2, 12/3 >> 12/6 --Reintubated 12/3>>Self-extubated on same day>>Reintubated for hypoxemia  Significant Diagnostic Tests:  CT Head 12/1 > 1. Hyperdense terminal left internal carotid artery and left MCA compatible with thrombosis. No evidence of acute infarct or Hemorrhage Atrophy and chronic microvascular ischemic changes in the white matter. CTA Head/Neck 12/1 > 1. 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 Reconstitution of the supraclinoid left internal carotid artery through the anterior communicating artery. There is clot in the left M1 and M2 segments with decreased blood flow. There is occlusion of the posterior division of the left middle cerebral artery due to Clot. 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 Moderate stenosis  origin of left vertebral artery Probable fetal origin left posterior cerebral artery which is supplied from a small hypoplastic left P1 segment.  Chest x-ray 03/27/2018- Cardiomegaly, mild vascular congestion. TTE 12/3 EF 65-70%. PA peak pressure:54 mmHg I reviewed the images personally.  Micro Data:  Tracheal Asp 12/1, 12/3 >> Normal Resp flore  Antimicrobials:  Levofloxacin 12/3 >>12/7  Interim history/subjective:  No complaints.  Continues on HF nasal cannula. Walking around the unit today with physical therapy.  Objective   Blood pressure (!) 110/50, pulse 63, temperature 98.2 F (36.8 C), temperature source Axillary, resp. rate 15, height 5\' 7"  (1.702 m), weight 75.9 kg, SpO2 97 %.        Intake/Output Summary (Last 24 hours) at 04/01/2018 1317 Last data filed at 03/31/2018 1652 Gross per 24 hour  Intake -  Output 100 ml  Net -100 ml   Filed Weights   03/27/18 0500 03/28/18 0500 04/01/18 0500  Weight: 74 kg 84.2 kg 75.9 kg    Examination: Gen:      Chronically ill-appearing HEENT:  EOMI, sclera anicteric Neck:     No masses; no thyromegaly Lungs:    Bibasilar crackles. CV:         Regular rate and rhythm; no murmurs Abd:      + bowel sounds; soft, non-tender; no palpable masses, no distension Ext:    No edema; adequate peripheral perfusion Skin:      Warm and dry; no rash Neuro: Awake, alert.  Moves all 4 extremities.  Resolved Hospital Problem list   Hyperkalemia Hematuria secondary to traumatic Foley in setting of TPA Hypotension secondary to sedation  Assessment & Plan:   #Acute on chronic hypoxemic respiratory failure #Pulmonary Fibrosis Exacerbation #Suspected pulmonary edema #  Hx of Aspiration  Dr. Lenna Gilford is his pulmonologist. On 2 L via [place] at home. Recent TTE suggestive of PH. 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 Plan  Wean  O2 as tolerated sat goals are 90-92% Pulmicort, Brovana.  Albuterol as needed Finished course of solumedrol and lefofloxacin PPI  for acid reflux. Bipap as needed at night. Aggressive pulmonary toilet and mobilization Honey thick liquids/ dysphagia diet  #Left MCA infarct secondary to left ICA and left M2 occlusion s/p tPA and revascularization on 12/1 Plan  Management per Neurology. BP goal <140 Continue ASA PT/OT-recommends inpatient rehab on discharge  #HTN #S/P Bi-V pacer/ICD, Followed by [facility]  #H/O G1 Diastolic HF, HLD Plan  Telemetry BP goal less than 140 post procedure per neurology Continue home Lipitor  Continue Coreg, lasix    DM Plan  Trend glucose SSI. Change to AC/HS  Best practice:  Diet: Dysphagia diet Pain/Anxiety/Delirium protocol: NA VAP protocol: NA DVT prophylaxis: SCD, Lovenox GI prophylaxis: PPI Glucose control: SSI Mobility: PT/OT Code Status: Partial code. No intubation Family Communication: No family at bedside 12/9 Disposition: To stepdown.  Labs    I have reviewed am labs 12/10 Potassium has been repleted by primary team  CBC: Recent Labs  Lab 03/26/18 0423 03/27/18 0308 03/28/18 0529 03/29/18 0647 04/01/18 0605  WBC 13.3* 12.8* 9.8 9.8 16.3*  HGB 10.9* 10.4* 10.4* 10.4* 11.0*  HCT 35.0* 33.1* 32.8* 32.6* 35.0*  MCV 92.6 91.4 92.1 91.8 92.8  PLT 183 174 170 190 626    Basic Metabolic Panel: Recent Labs  Lab 03/26/18 0423 03/26/18 1320 03/26/18 1639 03/27/18 0308 03/27/18 1659 03/28/18 0529 03/29/18 0647 04/01/18 0605  NA 139  --   --  139  --  141 145 143  K 4.0  --   --  3.5  --  3.6 3.5 3.2*  CL 104  --   --  105  --  106 110 103  CO2 22  --   --  24  --  22 28 29   GLUCOSE 233*  --   --  236*  --  250* 115* 188*  BUN 17  --   --  23  --  25* 26* 31*  CREATININE 0.87  --   --  0.66  --  0.68 0.85 0.79  CALCIUM 8.7*  --   --  8.7*  --  8.4* 8.4* 8.5*  MG  --  1.9 2.0 2.1 2.2  --   --  2.0  PHOS  --  2.2* 2.1* 1.7* 2.5  --   --  3.2   GFR: Estimated Creatinine Clearance: 72.3 mL/min (by C-G formula based on SCr of 0.79 mg/dL). Recent  Labs  Lab 03/27/18 0308 03/28/18 0529 03/29/18 0647 04/01/18 0605  WBC 12.8* 9.8 9.8 16.3*    ABG    Component Value Date/Time   PHART 7.403 03/26/2018 0407   PCO2ART 38.3 03/26/2018 0407   PO2ART 73.0 (L) 03/26/2018 0407   HCO3 23.9 03/26/2018 0407   TCO2 25 03/26/2018 0407   ACIDBASEDEF 1.0 03/26/2018 0407   O2SAT 95.0 03/26/2018 0407    CBG: Recent Labs  Lab 03/31/18 1128 03/31/18 1611 03/31/18 2136 04/01/18 0639 04/01/18 [place], AGACNP-BC [place] Pager # [phone], if no answer call [phone] 04/01/2018, 1:17 PM   Attending note: I have seen and examined the patient. History, labs and imaging reviewed.  Transfered out of ICU  yesterday.  No complaints today morning Working with PT and speech pathology.  Blood pressure (!) 110/50, pulse 64, temperature 98.2 F (36.8 C), temperature source Axillary, resp. rate 18, height 5\' 7"  (1.702 m), weight 75.9 kg, SpO2 92 %. Gen:      No acute distress HEENT:  EOMI, sclera anicteric Neck:     No masses; no thyromegaly Lungs:    Clear to auscultation bilaterally; normal respiratory effort CV:         Regular rate and rhythm; no murmurs Abd:      + bowel sounds; soft, non-tender; no palpable masses, no distension Ext:    No edema; adequate peripheral perfusion Skin:      Warm and dry; no rash Neuro: alert and oriented x 3 Psych: normal mood and affect   Labs reviewed, significant for Sodium 143, potassium 3.2, BUN/creatinine 31/0.79 WBC 16.3, hemoglobin 11, platelets 245  Imaging Chest x-ray 04/01/2018- low lung volumes with atelectasis, chronic interstitial lung disease. I have reviewed the images personally.  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  Managed conservatively as an outpatient Admitted with left MCA infarct status post revascularization  Respiratory failure Pulmonary fibrosis Wean  down oxygen as tolerated Continue Pulmicort, Brovana.  Albuterol as needed BiPAP PRN at night  Left MCA infarct Management per neurology We will eventually need inpatient rehab  Marshell Garfinkel MD [facility] 04/01/2018, 2:36 PM

## 2018-04-01 NOTE — Progress Notes (Signed)
Inpatient Diabetes Program Recommendations  AACE/ADA: New Consensus Statement on Inpatient Glycemic Control (2015)  Target Ranges:  Prepandial:   less than 140 mg/dL      Peak postprandial:   less than 180 mg/dL (1-2 hours)      Critically ill patients:  140 - 180 mg/dL   Lab Results  Component Value Date   GLUCAP 257 (H) 04/01/2018   HGBA1C 7.0 (H) 03/24/2018    Review of Glycemic Control Results for Terry Peck, Terry Peck (MRN [id number]) as of 04/01/2018 13:09  Ref. Range 03/31/2018 21:36 04/01/2018 06:39 04/01/2018 11:01  Glucose-Capillary Latest Ref Range: 70 - 99 mg/dL 372 (H) 173 (H) 257 (H)   Current orders for Inpatient glycemic control: Novolog 0-15 units TID, Novolog 0-5 units QHS  Inpatient Diabetes Program Recommendations:   If post prandials continue to exceed 180 mg/dL, consider ordering Novolog 3 units TID with meals for meal coverage if patient eats at least 50% of meals.   Thanks, Bronson Curb, MSN, RNC-OB Diabetes Coordinator [phone] (8a-5p)

## 2018-04-01 NOTE — Progress Notes (Signed)
STROKE TEAM PROGRESS NOTE   SUBJECTIVE (INTERVAL HISTORY) Sitting up in the chair. On 10 litres oxygen/min  for pulmonary fibrosis. Neurologically no changes. Continues to have mild right-sided weakness   OBJECTIVE Temp:  [98 F (36.7 C)-98.2 F (36.8 C)] 98.2 F (36.8 C) (12/10 1106) Pulse Rate:  [59-86] 64 (12/10 1407) Cardiac Rhythm: Ventricular paced (12/10 0702) Resp:  [15-22] 18 (12/10 1407) BP: (98-135)/(50-69) 110/50 (12/10 1106) SpO2:  [87 %-100 %] 92 % (12/10 1407) Weight:  [75.9 kg] 75.9 kg (12/10 0500)  Recent Labs  Lab 03/31/18 1128 03/31/18 1611 03/31/18 2136 04/01/18 0639 04/01/18 1101  GLUCAP 228* 273* 372* 173* 257*   Recent Labs  Lab 03/26/18 0423 03/26/18 1320 03/26/18 1639 03/27/18 0308 03/27/18 1659 03/28/18 0529 03/29/18 0647 04/01/18 0605  NA 139  --   --  139  --  141 145 143  K 4.0  --   --  3.5  --  3.6 3.5 3.2*  CL 104  --   --  105  --  106 110 103  CO2 22  --   --  24  --  22 28 29   GLUCOSE 233*  --   --  236*  --  250* 115* 188*  BUN 17  --   --  23  --  25* 26* 31*  CREATININE 0.87  --   --  0.66  --  0.68 0.85 0.79  CALCIUM 8.7*  --   --  8.7*  --  8.4* 8.4* 8.5*  MG  --  1.9 2.0 2.1 2.2  --   --  2.0  PHOS  --  2.2* 2.1* 1.7* 2.5  --   --  3.2   Recent Labs  Lab 03/26/18 0423 03/27/18 0308 03/28/18 0529 03/29/18 0647 04/01/18 0605  WBC 13.3* 12.8* 9.8 9.8 16.3*  HGB 10.9* 10.4* 10.4* 10.4* 11.0*  HCT 35.0* 33.1* 32.8* 32.6* 35.0*  MCV 92.6 91.4 92.1 91.8 92.8  PLT 183 174 170 190 245    Dg Chest Port 1 View  Result Date: 04/01/2018 CLINICAL DATA:  77 year old male status post emergent large vessel occlusion (left ICA, left MCA) and endovascular treatment. respiratory failure. Pulmonary fibrosis, oxygen-dependent. EXAM: PORTABLE CHEST 1 VIEW COMPARISON:  03/27/2018 and earlier. FINDINGS: Portable AP semi upright view at 0653 hours. Extubated and enteric tube removed. Stable left chest AICD. Lung volumes remain lower than  baseline with chronic reticular opacity in both lungs. No pneumothorax. Mild lung base atelectasis suspected. Stable cardiac size and mediastinal contours. There is oral contrast in the transverse colon. IMPRESSION: 1. Extubated and enteric tube removed. 2. Lower lung volumes with atelectasis superimposed on chronic lung disease. 3. Oral contrast in the transverse colon. Electronically Signed   By: Genevie Ann M.D.   On: 04/01/2018 09:11     TTE - Full study not performed as pt requested study to be stopped   prior to completion; only partial parasternal and no   apical/subcostal images obtained; vigorous LV systolic function;   doppler incomplete.  PHYSICAL EXAM per Dr. Madison Hickman - Well nourished, well developed elderly African-American male  Afebrile. Head is nontraumatic. Neck is supple without bruit.    Cardiac exam no murmur or gallop. Lungs are clear to auscultation. Distal pulses are well felt. Neurological Exam :  -  Awake alert.  . More oriented today   and follows only simple midline and one-step commands.. Eyes move to both directions, no gaze preference. Pupil 70mm bilaterally,  reactive to light, not consistently blinking to visual threat bilaterally but attending objects to both sides.  Bilateral corneal and cough reflex present.  Facial symmetric  Moving all extremities  But mild right hemiparesis 4/5. Mild bilateral proximal lower extremity weakness right greater than left.Marland Kitchen DTR 1+ and no babinski bilaterally. Sensation, coordination and gait not tested.    ASSESSMENT/PLAN [name] is a 77 y.o. male with history of CHF on pacer, pulmonary fibrosis, HTN, HLD admitted for right side weakness and aphasia. TPA given. 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 -proximal L ICA occlusion with distal embolization to the MCA  CT head left MCA hyperdense sign, b/l hygroma   CTA  head and neck - left ICA and MCA occlusion, left VA origin athero, left fetal PCA patent distally  MRI / MRA not able to perform due to pacemaker  CT repeat left MCA infarct  DSA left ICA and MCA occlusion s/p TICI 3 reperfusion  2D Echo EF 65-70%  Pacemaker interrogation no afib  LDL 47  HgbA1c 7.0  SCDs for VTE prophylaxis  aspirin 81 mg daily prior to admission, now on ASA 325mg .  Therapy recommendations:  CIR. Rehab consulted.  Disposition:  Pending  Acute on chronic hypoxemic Respiratory Failure  Pulmonary Fibrosis Suspected Pulmonary Edema Hx aspiration  Following with pulmonary for PF as outpt  Intubated - extubated- reintubated - self extubated - re-intubated - extubated  On Levaquin for aspiration prevention  CCM on board  CHF with pacer Hx diastolic HF  Followed Dr. Rayann Heman  EF 60-65% in 08/2016  TTE this time EF 65-70%  Pacer interrogation no afib  Diabetes type II  HgbA1c 7.0,  goal < 7.0  Hyperglycemia continued with TF, on steroids solumedrol 60 daily  Home meds - lantus 50 U daily  Added lantus 15U bid and novolog 3U q4h  CBG monitoring  SSI   DM coordinator following - recommends addition of 3u tid w/ meals if steroids continues with >50% meal intake  Hypotension Hx of hypertension . Treated with levophed . On Coreg 12.5 - continue . Hold cozaar and lasix today  Long term BP goal normotensive  Hyperlipidemia  Home meds:  lipitor 20   LDL 47, goal < 70  Now on lipitor 20  Continue statin at discharge  Gross hematuria, resolved  Likely due to uretal trauma  Difficulty foley insertion - urology put in catheter  Hematuria resolved   Other Stroke Risk Factors  Advanced age  Other Active Problems  Hyperkalemia, resolved 3.5  Leukocytosis, resolved WBC 9.8  Hospital day # 9    Continue weaning oxygen as per critical care team. Patient will likely need to be transferred to  [facility]b when o 2 requirements go  down.t. Discuss with Dr.Adhikari  . who agrees with plan.greater than 50% time during this 25 minute visit was spent on counseling and coordination of care about his stroke and discussion with care team.     Antony Contras, MD Medical Director [facility] Pager: [phone] 04/01/2018 2:34 PM   To contact Stroke Continuity provider, please refer to [email or web address] After hours, contact General Neurology

## 2018-04-01 NOTE — Progress Notes (Signed)
Physical Therapy Treatment Patient Details Name: Terry Peck MRN: [id number] DOB: 1940-10-26 Today's Date: 04/01/2018    History of Present Illness Terry Peck is an 77 y.o. male HTN, DM, pulmonary fibrosis, HLD, CHF who presented to [facility] ED as a code stroke with c/o of right facial droop, right sided weakness and aphasia. 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 Intubated 12/1, extubated 12/2, then re-intubated 12/3, self extubated later on 12/3, emergently re-intubated on 12/3.Marland Kitchen Pt extubated on 12/6    PT Comments    Pt was limited today to a standing activity at the sink and walking in the room.  Sats on 9-15 L HFNC often in the lower 80's needing the 15L and coaching to get sats up to acceptable levels.  Follow Up Recommendations  CIR     Equipment Recommendations  3in1 (PT)    Recommendations for Other Services Rehab consult     Precautions / Restrictions Precautions Precautions: Fall Precaution Comments: aphasia Required Braces or Orthoses: Knee Immobilizer - Left Restrictions Weight Bearing Restrictions: No    Mobility  Bed Mobility Overal bed mobility: Needs Assistance Bed Mobility: Supine to Sit;Sit to Supine     Supine to sit: Supervision Sit to supine: Supervision   General bed mobility comments: supervision for safety, patient able to follow simple commands "sit up" and "lay down" without physical assistance   Transfers Overall transfer level: Needs assistance Equipment used: Rolling walker (2 wheeled);None Transfers: Sit to/from Stand Sit to Stand: Min assist         General transfer comment: min assist for safety and balance   Ambulation/Gait Ambulation/Gait assistance: Min assist;+2 safety/equipment Gait Distance (Feet): 25 Feet Assistive device: Rolling walker (2 wheeled) Gait Pattern/deviations: Step-through pattern Gait velocity: dec Gait velocity interpretation: <1.31 ft/sec, indicative of household ambulator General Gait  Details: mild left lateral hip drop with compensatory truncal movement and uneven step length..  Pt's gait limited today due to unable to keep hes saturations to 90% on 10 up to 15L HFNC   Stairs             Wheelchair Mobility    Modified Rankin (Stroke Patients Only) Modified Rankin (Stroke Patients Only) Pre-Morbid Rankin Score: No symptoms Modified Rankin: Moderately severe disability     Balance Overall balance assessment: Needs assistance Sitting-balance support: Feet supported;Bilateral upper extremity supported Sitting balance-Leahy Scale: Good Sitting balance - Comments: min guard to close supervision while seated EOB    Standing balance support: During functional activity;Single extremity supported Standing balance-Leahy Scale: Poor Standing balance comment: reliant on external support, grooming tasks with foward lean of trunk onto sink                             Cognition Arousal/Alertness: Awake/alert Behavior During Therapy: WFL for tasks assessed/performed Overall Cognitive Status: Impaired/Different from baseline Area of Impairment: Memory;Following commands;Safety/judgement;Awareness;Problem solving                     Memory: Decreased short-term memory Following Commands: Follows one step commands with increased time;Follows one step commands inconsistently Safety/Judgement: Decreased awareness of safety;Decreased awareness of deficits Awareness: Emergent Problem Solving: Slow processing;Difficulty sequencing;Requires verbal cues;Requires tactile cues General Comments: continues to have word finding difficulties, unable to state name when asked but responds yes to "is your name Arvon?"       Exercises      General Comments General comments (skin integrity, edema, etc.):  9 L min and up to 15 L HFNC during gait.        Pertinent Vitals/Pain Pain Assessment: No/denies pain    Home Living                      Prior  Function            PT Goals (current goals can now be found in the care plan section) Acute Rehab PT Goals Patient Stated Goal: didn't state PT Goal Formulation: With patient/family Time For Goal Achievement: 04-16-18 Potential to Achieve Goals: Good Progress towards PT goals: Progressing toward goals(slowly, not feeling well today)    Frequency    Min 4X/week      PT Plan Current plan remains appropriate    Co-evaluation PT/OT/SLP Co-Evaluation/Treatment: Yes Reason for Co-Treatment: Complexity of the patient's impairments (multi-system involvement) PT goals addressed during session: Mobility/safety with mobility OT goals addressed during session: ADL's and self-care;Other (comment)(mobility)      AM-PAC PT "6 Clicks" Mobility   Outcome Measure  Help needed turning from your back to your side while in a flat bed without using bedrails?: A Little Help needed moving from lying on your back to sitting on the side of a flat bed without using bedrails?: A Little Help needed moving to and from a bed to a chair (including a wheelchair)?: A Little Help needed standing up from a chair using your arms (e.g., wheelchair or bedside chair)?: A Little Help needed to walk in hospital room?: A Lot Help needed climbing 3-5 steps with a railing? : A Lot 6 Click Score: 16    End of Session Equipment Utilized During Treatment: Oxygen Activity Tolerance: Patient tolerated treatment well(there was fatigue, but not as much as expected.) Patient left: in bed;with call bell/phone within reach;with bed alarm set;with family/visitor present Nurse Communication: Mobility status PT Visit Diagnosis: Unsteadiness on feet (R26.81);Difficulty in walking, not elsewhere classified (R26.2)     Time: 2585-2778 PT Time Calculation (min) (ACUTE ONLY): 26 min  Charges:  $Therapeutic Activity: 8-22 mins                     04/01/2018  Donnella Sham, PT Acute Rehabilitation Services [phone]   (pager) [phone]  (office)   Tessie Fass Brina Umeda 04/01/2018, 5:46 PM

## 2018-04-01 NOTE — Progress Notes (Signed)
Occupational Therapy Treatment Patient Details Name: Terry Peck: [id number] DOB: 05-Dec-1940 Today's Date: 04/01/2018    History of present illness Terry Peck is an 77 y.o. male HTN, DM, pulmonary fibrosis, HLD, CHF who presented to [facility] ED as a code stroke with c/o of right facial droop, right sided weakness and aphasia. 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 Intubated 12/1, extubated 12/2, then re-intubated 12/3, self extubated later on 12/3, emergently re-intubated on 12/3.Marland Kitchen Pt extubated on 12/6   OT comments  Patient supine in bed and eager to participate with PT/OT.  Patient continues to present with aphasia, decreased activity tolerance and impaired balance.  He is able to complete bed mobility with close supervision, transfers and mobility with min assist using RW.  Patient stood at sink to engage in self care tasks (grooming, bathing, and UB dressing), with min assist given 1 step simple commands; cueing for sequencing and problem solving (as patient attempts to wash peri area over gown and requires cueing to use new wash clothes).  Patient oxygen saturations monitored throughout session, initially on 9L supplemental on HFNC then dropping to 73 with self care tasks and required 15L supplemental on HFNC given increased time to recover; attempted 10L supplemental on HFNC during mobility but required increase to 15L as dropped to 83 quickly.  Patient supine in bed and returned to 90% on 10 L supplemental via HFNC. Continue to recommend CIR, will continue to follow.    Follow Up Recommendations  CIR    Equipment Recommendations  Other (comment)(TBd at next venue of care)    Recommendations for Other Services Rehab consult    Precautions / Restrictions Precautions Precautions: Fall Precaution Comments: aphasia Restrictions Weight Bearing Restrictions: No       Mobility Bed Mobility Overal bed mobility: Needs Assistance Bed Mobility: Supine to Sit;Sit to  Supine     Supine to sit: Supervision Sit to supine: Supervision   General bed mobility comments: supervision for safety, patient able to follow simple commands "sit up" and "lay down" without physical assistance   Transfers Overall transfer level: Needs assistance Equipment used: Rolling walker (2 wheeled);None Transfers: Sit to/from Stand Sit to Stand: Min assist         General transfer comment: min assist for safety and balance     Balance Overall balance assessment: Needs assistance Sitting-balance support: Feet supported;Bilateral upper extremity supported Sitting balance-Leahy Scale: Good Sitting balance - Comments: min guard to close supervision while seated EOB    Standing balance support: During functional activity;Single extremity supported Standing balance-Leahy Scale: Poor Standing balance comment: reliant on external support, grooming tasks with foward lean of trunk onto sink                            ADL either performed or assessed with clinical judgement   ADL Overall ADL's : Needs assistance/impaired     Grooming: Minimal assistance;Standing   Upper Body Bathing: Minimal assistance;Standing   Lower Body Bathing: Minimal assistance;+2 for safety/equipment;Sit to/from stand Lower Body Bathing Details (indicate cue type and reason): peri area only, cueing for problem sovling as patient washing gown versus his body Upper Body Dressing : Moderate assistance;Sitting Upper Body Dressing Details (indicate cue type and reason): donning gown     Toilet Transfer: Minimal assistance;Ambulation;RW Toilet Transfer Details (indicate cue type and reason): simulated to/from EOB         Functional mobility during ADLs: Minimal assistance;Rolling  walker;+2 for safety/equipment(with and without RW ) General ADL Comments: pt limited by generalized weakness and oxgyen saturations      Vision   Additional Comments: continue to assess, able to locate  wash clothes but unable to find light switch at sink   Perception     Praxis      Cognition Arousal/Alertness: Awake/alert Behavior During Therapy: WFL for tasks assessed/performed Overall Cognitive Status: Impaired/Different from baseline Area of Impairment: Memory;Following commands;Safety/judgement;Awareness;Problem solving                     Memory: Decreased short-term memory Following Commands: Follows one step commands with increased time;Follows one step commands inconsistently Safety/Judgement: Decreased awareness of safety;Decreased awareness of deficits Awareness: Emergent Problem Solving: Slow processing;Difficulty sequencing;Requires verbal cues;Requires tactile cues General Comments: continues to have word finding difficulties, unable to state name when asked but responds yes to "is your name Terry Peck?"         Exercises     Shoulder Instructions       General Comments Pt on 9L HFNC supplemental oxygen upon arrival and required 15L to sustain oxgyen at 90%.      Pertinent Vitals/ Pain       Pain Assessment: No/denies pain  Home Living                                          Prior Functioning/Environment              Frequency  Min 2X/week        Progress Toward Goals  OT Goals(current goals can now be found in the care plan section)  Progress towards OT goals: Progressing toward goals  Acute Rehab OT Goals Patient Stated Goal: didn't state Time For Goal Achievement: April 19, 2018 Potential to Achieve Goals: Good  Plan Discharge plan remains appropriate;Frequency remains appropriate    Co-evaluation      Reason for Co-Treatment: Complexity of the patient's impairments (multi-system involvement)   OT goals addressed during session: ADL's and self-care;Other (comment)(mobility)      AM-PAC OT "6 Clicks" Daily Activity     Outcome Measure   Help from another person eating meals?: A Little Help from another person  taking care of personal grooming?: A Little Help from another person toileting, which includes using toliet, bedpan, or urinal?: A Lot Help from another person bathing (including washing, rinsing, drying)?: A Lot Help from another person to put on and taking off regular upper body clothing?: A Lot Help from another person to put on and taking off regular lower body clothing?: A Lot 6 Click Score: 14    End of Session Equipment Utilized During Treatment: Oxygen  OT Visit Diagnosis: Other abnormalities of gait and mobility (R26.89);Muscle weakness (generalized) (M62.81);Other symptoms and signs involving cognitive function   Activity Tolerance Patient tolerated treatment well   Patient Left in bed;with call bell/phone within reach;with bed alarm set;with family/visitor present   Nurse Communication Mobility status;Other (comment)(oxgyen status)        Time: 1610-9604 OT Time Calculation (min): 26 min  Charges: OT General Charges $OT Visit: 1 Visit OT Treatments $Self Care/Home Management : 8-22 mins  Delight Stare, OT Acute Rehabilitation Services Pager [phone] Office [phone]    Delight Stare 04/01/2018, 5:15 PM

## 2018-04-01 NOTE — Progress Notes (Signed)
Patient currently on  10 lpm HFNC, (salter).  No distress noted.

## 2018-04-01 NOTE — Progress Notes (Addendum)
PROGRESS NOTE    Terry Peck  RKY:[id number] DOB: [birth year] DOA: 03/23/2018 PCP: Biagio Borg, MD   Brief Narrative: Patient is a 77 year old former smoker who initially presented here with right-sided weakness and aphasia on 03/23/2018 and was found to have left MCA infarct secondary to left ICA and M2 occlusion.  He has history of pulmonary fibrosis and he was also found to be severely hypoxic on presentation.  He was given TPA in the CT scanner.  He had to be intubated before  . He was admitted under  PCCM service.He was extubated on 12/2 but had to be reintubated on 12/3.  He self extubated and was again reintubated.  Finally extubated on 12/6 . TRH service started from 04/01/18. Currently he is still on high flow nasal cannula.  PCCM following.  Plan is to transfer him to CIR.  Assessment & Plan:   Active Problems:   Stroke [facility])   Middle cerebral artery embolism, left   Acute embolic stroke (HCC)   Hypoxia   Pulmonary fibrosis (HCC)   Dysphasia, post-stroke   Dysphagia, post-stroke   Diabetes mellitus type 2 in nonobese [facility])   Essential hypertension   Hypokalemia   Acute respiratory failure ([place])  Acute ischemic Stroke: Found to have left MCA infarct secondary to left ICA and M2 occlusion.  Neurology following.S/P tPA and reperfusion via complete revascularization of occluded left ICA by IR. Physical therapy recommended CIR. Speech recommended dysphagia 2 diet. Continue aspirin, Lipitor  Acute on chronic respiratory failure with hypoxia/pulmonary fibrosis: Currently on high flow nasal cannula.  He is on 2 L oxygen via nasal cannula at home.  Pulmonary following.  We will continue to attempt for weaning down the oxygen.  Continue Pulmicort, Brovana. Albuterol as needed.  BiPAP as needed at night.  He follows with pulmonology,Dr Lenna Gilford, as an outpatient. Chest x-ray today showed low lung volumes with atelectasis, chronic interstitial lung disease.  He finished course of  Solu-Medrol and levofloxacin.  Continue aggressive pulmonary toilet. If his pulmonary function does not improve, will probably need to discuss about palliative medicine and goals of care.  HTN: Currently blood pressure stable.  Continue current regimen   Diabetes mellitus: Continue current insulin regimen for now.  Chronic diastolic heart failure: Continue current Lasix treatment.  Echocardiogram on 12/3 showed ejection fraction of 65-70%, possible pulmonary hypertension. He is Status post biventricular pacemaker/ICD for chronic systolic distal function / left bundle branch block.  His ejection fraction has normalized with CRT. Followed by [facility] Dr. Cathie Olden.  Hypokalemia: Supplemented with potassium.  Leukocytosis: Most likely associate with recent use of steroids.  Continue to monitor CBC.    Nutrition Problem: Increased nutrient needs Etiology: post-op healing      DVT prophylaxis: Lovenox Code Status: Partial Family Communication: None present at the bedside Disposition Plan: CIR after improvement in the respiratory status   Consultants: Pulmonology, neurology  Procedures: Intubation, TPA,evascularization of occluded left ICA  Antimicrobials:None   Subjective: Patient seen and examined at the bedside this morning.  He is currently hemodynamically stable.  On very high flow oxygen at 10 L/min.  Surprisingly, he looked comfortable during my evaluation and was not in  respiratory distress.  Denied Any chest pain.  Objective: Vitals:   04/01/18 0845 04/01/18 0851 04/01/18 1106 04/01/18 1407  BP:   (!) 110/50   Pulse:   63 64  Resp:   15 18  Temp:   98.2 F (36.8 C)   TempSrc:   Axillary  SpO2: (!) 89% 90% 97% 92%  Weight:      Height:        Intake/Output Summary (Last 24 hours) at 04/01/2018 1533 Last data filed at 03/31/2018 1652 Gross per 24 hour  Intake -  Output 100 ml  Net -100 ml   Filed Weights   03/27/18 0500 03/28/18 0500 04/01/18 0500  Weight: 74 kg  84.2 kg 75.9 kg    Examination:  General exam: Not in obvious distress,average built HEENT:PERRL,Oral mucosa moist, Ear/Nose normal on gross exam Respiratory system: Bilateral decreased air entry, crackles  Cardiovascular system: S1 & S2 heard, RRR. No JVD, murmurs, rubs, gallops or clicks. No pedal edema.ICD Gastrointestinal system: Abdomen is nondistended, soft and nontender. No organomegaly or masses felt. Normal bowel sounds heard. Central nervous system: Alert and oriented. Weakness on the right side Extremities: No edema, no clubbing ,no cyanosis, distal peripheral pulses palpable. Skin: No rashes, lesions or ulcers,no icterus ,no pallor Psychiatry: Judgement and insight appear normal. Mood & affect appropriate.     Data Reviewed: I have personally reviewed following labs and imaging studies  CBC: Recent Labs  Lab 03/26/18 0423 03/27/18 0308 03/28/18 0529 03/29/18 0647 04/01/18 0605  WBC 13.3* 12.8* 9.8 9.8 16.3*  HGB 10.9* 10.4* 10.4* 10.4* 11.0*  HCT 35.0* 33.1* 32.8* 32.6* 35.0*  MCV 92.6 91.4 92.1 91.8 92.8  PLT 183 174 170 190 751   Basic Metabolic Panel: Recent Labs  Lab 03/26/18 0423 03/26/18 1320 03/26/18 1639 03/27/18 0308 03/27/18 1659 03/28/18 0529 03/29/18 0647 04/01/18 0605  NA 139  --   --  139  --  141 145 143  K 4.0  --   --  3.5  --  3.6 3.5 3.2*  CL 104  --   --  105  --  106 110 103  CO2 22  --   --  24  --  22 28 29   GLUCOSE 233*  --   --  236*  --  250* 115* 188*  BUN 17  --   --  23  --  25* 26* 31*  CREATININE 0.87  --   --  0.66  --  0.68 0.85 0.79  CALCIUM 8.7*  --   --  8.7*  --  8.4* 8.4* 8.5*  MG  --  1.9 2.0 2.1 2.2  --   --  2.0  PHOS  --  2.2* 2.1* 1.7* 2.5  --   --  3.2   GFR: Estimated Creatinine Clearance: 72.3 mL/min (by C-G formula based on SCr of 0.79 mg/dL). Liver Function Tests: No results for input(s): AST, ALT, ALKPHOS, BILITOT, PROT, ALBUMIN in the last 168 hours. No results for input(s): LIPASE, AMYLASE in the  last 168 hours. No results for input(s): AMMONIA in the last 168 hours. Coagulation Profile: No results for input(s): INR, PROTIME in the last 168 hours. Cardiac Enzymes: No results for input(s): CKTOTAL, CKMB, CKMBINDEX, TROPONINI in the last 168 hours. BNP (last 3 results) No results for input(s): PROBNP in the last 8760 hours. HbA1C: No results for input(s): HGBA1C in the last 72 hours. CBG: Recent Labs  Lab 03/31/18 1128 03/31/18 1611 03/31/18 2136 04/01/18 0639 04/01/18 1101  GLUCAP 228* 273* 372* 173* 257*   Lipid Profile: No results for input(s): CHOL, HDL, LDLCALC, TRIG, CHOLHDL, LDLDIRECT in the last 72 hours. Thyroid Function Tests: No results for input(s): TSH, T4TOTAL, FREET4, T3FREE, THYROIDAB in the last 72 hours. Anemia Panel: No results for input(s): VITAMINB12,  FOLATE, FERRITIN, TIBC, IRON, RETICCTPCT in the last 72 hours. Sepsis Labs: No results for input(s): PROCALCITON, LATICACIDVEN in the last 168 hours.  Recent Results (from the past 240 hour(s))  Culture, respiratory (non-expectorated)     Status: None   Collection Time: 03/23/18  9:52 PM  Result Value Ref Range Status   Specimen Description TRACHEAL ASPIRATE  Final   Special Requests NONE  Final   Gram Stain   Final    RARE WBC PRESENT,BOTH PMN AND MONONUCLEAR NO ORGANISMS SEEN    Culture   Final    RARE Consistent with normal respiratory flora. Performed at [facility], [place] [address], [place], [place] [address]    Report Status 03/26/2018 FINAL  Final  MRSA PCR Screening     Status: None   Collection Time: 03/23/18 10:01 PM  Result Value Ref Range Status   MRSA by PCR NEGATIVE NEGATIVE Final    Comment:        The GeneXpert MRSA Assay (FDA approved for NASAL specimens only), is one component of a comprehensive MRSA colonization surveillance program. It is not intended to diagnose MRSA infection nor to guide or monitor treatment for MRSA infections. Performed at [facility], [place] [address] [place], [place] [address]   Culture, respiratory (non-expectorated)     Status: None   Collection Time: 03/25/18  8:31 PM  Result Value Ref Range Status   Specimen Description TRACHEAL ASPIRATE  Final   Special Requests NONE  Final   Gram Stain   Final    ABUNDANT WBC PRESENT,BOTH PMN AND MONONUCLEAR MODERATE GRAM POSITIVE COCCI MODERATE GRAM NEGATIVE COCCI MODERATE GRAM NEGATIVE RODS    Culture   Final    ABUNDANT Consistent with normal respiratory flora. Performed at [facility], [place] [address], [place], [place] [address]    Report Status 03/28/2018 FINAL  Final         Radiology Studies: Dg Chest Port 1 View  Result Date: 04/01/2018 CLINICAL DATA:  77 year old male status post emergent large vessel occlusion (left ICA, left MCA) and endovascular treatment. respiratory failure. Pulmonary fibrosis, oxygen-dependent. EXAM: PORTABLE CHEST 1 VIEW COMPARISON:  03/27/2018 and earlier. FINDINGS: Portable AP semi upright view at 0653 hours. Extubated and enteric tube removed. Stable left chest AICD. Lung volumes remain lower than baseline with chronic reticular opacity in both lungs. No pneumothorax. Mild lung base atelectasis suspected. Stable cardiac size and mediastinal contours. There is oral contrast in the transverse colon. IMPRESSION: 1. Extubated and enteric tube removed. 2. Lower lung volumes with atelectasis superimposed on chronic lung disease. 3. Oral contrast in the transverse colon. Electronically Signed   By: Genevie Ann M.D.   On: 04/01/2018 09:11        Scheduled Meds: .  stroke: mapping our early stages of recovery book   Does not apply Once  . arformoterol  15 mcg Nebulization BID  . aspirin EC  325 mg Oral Daily   Or  . aspirin  300 mg Rectal Daily  . atorvastatin  20 mg Oral q1800  . budesonide (PULMICORT) nebulizer solution  0.5 mg Nebulization BID  . carvedilol  12.5 mg Oral BID  . chlorhexidine  15 mL Mouth Rinse BID  .  enoxaparin (LOVENOX) injection  40 mg Subcutaneous Daily  . furosemide  40 mg Oral Daily  . insulin aspart  0-15 Units Subcutaneous TID WC  . insulin aspart  0-5 Units Subcutaneous QHS  . mouth rinse  15 mL Mouth Rinse q12n4p  . pantoprazole (PROTONIX) IV  40 mg Intravenous QHS  . polyethylene glycol  17 g Oral Daily   Continuous Infusions: . sodium chloride Stopped (03/30/18 1316)  . sodium chloride 10 mL/hr at 03/28/18 1900     LOS: 9 days    Time spent: 35 mins.More than 50% of that time was spent in counseling and/or coordination of care.      Shelly Coss, MD [facility] Pager [phone]  If 7PM-7AM, please contact night-coverage [email or web address] Password [facility] 04/01/2018, 3:33 PM

## 2018-04-01 NOTE — Progress Notes (Addendum)
Inpatient Rehabilitation Admissions Coordinator  I met with pt at bedside for assessment. I have left a message for his wife and his daughter to contact me to discuss goals of an inpt rehab admit and to clarify baseline oxygen needs( pulmonary consult states 2 to 3 liters [place] at baseline) and functional abilities. Need to begin wean of O2 before proceeding with insurance approval for a possible inpt rehab admit.  Danne Baxter, RN, MSN Rehab Admissions Coordinator [phone] 04/01/2018 11:32 AM

## 2018-04-01 NOTE — Progress Notes (Signed)
  Speech Language Pathology Treatment: Dysphagia  Patient Details Name: Terry Peck MRN: [id number] DOB: Mar 07, 1941 Today's Date: 04/01/2018 Time: 7322-0254 SLP Time Calculation (min) (ACUTE ONLY): 20 min  Assessment / Plan / Recommendation Clinical Impression  Follow up after MBS completed 03/30/18. Pt exhibited silent aspiration on that study, and dys 2 diet and honey thick liquids were recommended. RN reports pt tolerating current diet and po meds. Pt was observed with honey thick liquids and soft solids, and appeared to tolerate each trial. Pt requires cues to use chin tuck position, which will be necessary with liberalized liquids (nectar thick). Continued ST intervention is recommended at next venue for education of safe swallow precautions/positions/techniques, and readiness for diet advancement. ST will continue to follow acutely.   HPI HPI: Terry Peck is an 77 y.o. male HTN, DM, pulmonary fibrosis, HLD, CHF who presented to [facility] ED as a code stroke with c/o of right facial droop, right sided weakness and aphasia. Patient was intubated for airway protection on 12/1. Extubated 12/2. Reintubated 12/3 due to respiratory decompensation. Self extubated 12/3 and emergently reintubated. Extubated 12/6 to Bipap, family changed status to DNI.       SLP Plan  Continue with current plan of care       Recommendations  Diet recommendations: Dysphagia 2 (fine chop);Honey-thick liquid Liquids provided via: Cup Medication Administration: Whole meds with puree Supervision: Patient able to self feed;Staff to assist with self feeding;Full supervision/cueing for compensatory strategies Compensations: Minimize environmental distractions;Slow rate;Small sips/bites Postural Changes and/or Swallow Maneuvers: Upright 30-60 min after meal;Seated upright 90 degrees                Oral Care Recommendations: Oral care QID Follow up Recommendations: Inpatient Rehab SLP Visit Diagnosis:  Dysphagia, oropharyngeal phase (R13.12) Plan: Continue with current plan of care       GO              Shamela Haydon B. Quentin [facility], CCC-SLP Speech Language Pathologist [phone]  Shonna Chock 04/01/2018, 12:19 PM

## 2018-04-02 ENCOUNTER — Inpatient Hospital Stay (HOSPITAL_COMMUNITY): Payer: Medicare Other

## 2018-04-02 LAB — BASIC METABOLIC PANEL
Anion gap: 12 (ref 5–15)
BUN: 29 mg/dL — ABNORMAL HIGH (ref 8–23)
CO2: 27 mmol/L (ref 22–32)
Calcium: 8.7 mg/dL — ABNORMAL LOW (ref 8.9–10.3)
Chloride: 101 mmol/L (ref 98–111)
Creatinine, Ser: 0.94 mg/dL (ref 0.61–1.24)
GFR calc Af Amer: >60 mL/min (ref >60)
GFR calc non Af Amer: >60 mL/min (ref >60)
Glucose, Bld: 331 mg/dL — ABNORMAL HIGH (ref 70–99)
Potassium: 4.1 mmol/L (ref 3.5–5.1)
Sodium: 140 mmol/L (ref 135–145)

## 2018-04-02 LAB — CBC WITH DIFFERENTIAL/PLATELET
Abs Immature Granulocytes: 0.39 10*3/uL — ABNORMAL HIGH (ref 0.00–0.07)
Basophils Absolute: 0 10*3/uL (ref 0.0–0.1)
Basophils Relative: 0 %
Eosinophils Absolute: 0.4 10*3/uL (ref 0.0–0.5)
Eosinophils Relative: 2 %
HCT: 38.4 % — ABNORMAL LOW (ref 39.0–52.0)
HEMOGLOBIN: 12.4 g/dL — AB (ref 13.0–17.0)
Immature Granulocytes: 2 %
Lymphocytes Relative: 6 %
Lymphs Abs: 1.4 10*3/uL (ref 0.7–4.0)
MCH: 29.6 pg (ref 26.0–34.0)
MCHC: 32.3 g/dL (ref 30.0–36.0)
MCV: 91.6 fL (ref 80.0–100.0)
Monocytes Absolute: 1.7 10*3/uL — ABNORMAL HIGH (ref 0.1–1.0)
Monocytes Relative: 7 %
NRBC: 0 % (ref 0.0–0.2)
Neutro Abs: 19.1 10*3/uL — ABNORMAL HIGH (ref 1.7–7.7)
Neutrophils Relative %: 83 %
Platelets: 288 10*3/uL (ref 150–400)
RBC: 4.19 MIL/uL — ABNORMAL LOW (ref 4.22–5.81)
RDW: 14.9 % (ref 11.5–15.5)
WBC: 23 10*3/uL — ABNORMAL HIGH (ref 4.0–10.5)

## 2018-04-02 LAB — GLUCOSE, CAPILLARY
Glucose-Capillary: 317 mg/dL — ABNORMAL HIGH (ref 70–99)
Glucose-Capillary: 259 mg/dL — ABNORMAL HIGH (ref 70–99)
Glucose-Capillary: 195 mg/dL — ABNORMAL HIGH (ref 70–99)
Glucose-Capillary: 262 mg/dL — ABNORMAL HIGH (ref 70–99)

## 2018-04-02 MED ORDER — PANTOPRAZOLE SODIUM 40 MG PO TBEC
40.0000 mg | DELAYED_RELEASE_TABLET | Freq: Every day | ORAL | Status: DC
  Administered 2018-04-02 – 2018-04-03 (×2): 40 mg via ORAL
  Filled 2018-04-02 (×2): qty 1

## 2018-04-02 NOTE — Progress Notes (Signed)
Inpatient Diabetes Program Recommendations  AACE/ADA: New Consensus Statement on Inpatient Glycemic Control (2015)  Target Ranges:  Prepandial:   less than 140 mg/dL      Peak postprandial:   less than 180 mg/dL (1-2 hours)      Critically ill patients:  140 - 180 mg/dL   Lab Results  Component Value Date   GLUCAP 259 (H) 04/02/2018   HGBA1C 7.0 (H) 03/24/2018    Review of Glycemic Control Results for Terry Peck, Terry Peck (MRN [id number]) as of 04/02/2018 11:55  Ref. Range 04/01/2018 16:33 04/01/2018 21:33 04/02/2018 06:16 04/02/2018 11:28  Glucose-Capillary Latest Ref Range: 70 - 99 mg/dL 222 (H) 183 (H) 317 (H) 259 (H)   Diabetes history: Type 2 DM Outpatient Diabetes medications: Lantus 50 units QD, Actos 30 units QD, Metformin 2000 mg QAM Current orders for Inpatient glycemic control:Novolog 0-15 units TID, Novolog 0-5 units QHS  Inpatient Diabetes Program Recommendations:  Consider restarting portion of patient's home basal insulin. Consider Lantus 12 units QD.   If post prandials continue to exceed 180 mg/dL, consider ordering Novolog 3 units TID with meals for meal coverage if patient eats at least 50% of meals.  Thanks, Bronson Curb, MSN, RNC-OB Diabetes Coordinator [phone] (8a-5p)

## 2018-04-02 NOTE — Progress Notes (Signed)
Attempted to change pt from 100% NRB back to HFNC, unsuccessful, placed back on NRB, notified RT who recommend pt remain on NRB. Notified [place] provider of RT recommendations. Awaiting reply. Arlis Porta

## 2018-04-02 NOTE — Progress Notes (Signed)
Pt back to room from CT; pt switched back to 15L HFNC. Will continue to closely monitor. Delia Heady RN

## 2018-04-02 NOTE — NC FL2 (Signed)
[place] LEVEL OF CARE SCREENING TOOL     IDENTIFICATION  Patient Name: Terry Peck Birthdate: [birth year] Sex: male Admission Date (Current Location): 03/23/2018  [facility] and [place] Number:  Herbalist and Address:  [facility], [place] [address], [place], [place] [address]      Provider Number: [id number]  Attending Physician Name and Address:  Mariel Aloe, MD  Relative Name and Phone Number:       Current Level of Care: Hospital Recommended Level of Care: [place] Prior Approval Number:    Date Approved/Denied:   PASRR Number:   [id number]   Discharge Plan: SNF    Current Diagnoses: Patient Active Problem List   Diagnosis Date Noted  . Acute embolic stroke ([place])   . Hypoxia   . Pulmonary fibrosis ([place])   . Dysphasia, post-stroke   . Dysphagia, post-stroke   . Diabetes mellitus type 2 in nonobese (HCC)   . Essential hypertension   . Hypokalemia   . Acute respiratory failure ([place])   . Stroke ([place]) 03/23/2018  . 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 Type 2 diabetes mellitus with complication, with long-term current use of insulin ([place]) 02/18/2017  . Restrictive lung disease 08/16/2016  . Nocturnal hypoxemia 08/16/2016  . Exercise hypoxemia 08/16/2016  . History of iron deficiency anemia 08/16/2016  . Symptomatic anemia 11/17/2014  . Solitary pulmonary nodule 03/12/2014  . Hyperlipidemia 10/14/2012  . Automatic implantable cardioverter-defibrillator in situ 01/31/2012  . Overweight (BMI 25.0-29.9) 09/10/2011  . Chronic systolic heart failure ([place]) 06/28/2011  . Left bundle branch block 06/28/2011  . IPF (idiopathic pulmonary fibrosis) ([place]) 06/25/2011  . Abnormal echocardiogram 06/20/2011  . Iron deficiency anemia 05/30/2011  . B12 deficiency 05/30/2011  . DOE (dyspnea on exertion) 05/29/2011  . Shoulder pain, right 05/29/2011  . Abnormal ECG 05/29/2011  . Preventative  health care 11/23/2010  . Colon polyps 11/23/2010  . Barrett's esophagus 11/23/2010  . Allergy   . Arthritis   . Diabetes ([place])   . GERD (gastroesophageal reflux disease)   . Hypertensive cardiovascular disease     Orientation RESPIRATION BLADDER Height & Weight     Self  Other (Comment)(HFNC- 5) Incontinent Weight: 170 lb 6.6 oz (77.3 kg) Height:  5\' 7"  (170.2 cm)  BEHAVIORAL SYMPTOMS/MOOD NEUROLOGICAL BOWEL NUTRITION STATUS        Diet(Please see discharge summary )  AMBULATORY STATUS COMMUNICATION OF NEEDS Skin   Extensive Assist Verbally Normal                       Personal Care Assistance Level of Assistance  Bathing, Feeding, Dressing Bathing Assistance: Limited assistance Feeding assistance: Independent Dressing Assistance: Limited assistance     Functional Limitations Info             SPECIAL CARE FACTORS FREQUENCY  PT (By licensed PT), OT (By licensed OT)     PT Frequency: 5 OT Frequency: 5            Contractures      Additional Factors Info  Code Status, Allergies Code Status Info: Partial  Allergies Info: PENICILLINS, IODINE, FISH OIL            Current Medications (04/02/2018):  This is the current hospital active medication list Current Facility-Administered Medications  Medication Dose Route Frequency Provider Last Rate Last Dose  .  stroke: mapping our early stages of recovery book  Does not apply Once Vonzella Nipple, NP      . 0.9 %  sodium chloride infusion  250 mL Intravenous Continuous Omar Person, NP   Stopped at 03/30/18 1316  . 0.9 %  sodium chloride infusion   Intravenous PRN Margaretha Seeds, MD 10 mL/hr at 03/28/18 1900    . acetaminophen (TYLENOL) tablet 650 mg  650 mg Oral Q4H PRN Luanne Bras, MD       Or  . acetaminophen (TYLENOL) solution 650 mg  650 mg Per Tube Q4H PRN Luanne Bras, MD   650 mg at 03/25/18 1400   Or  . acetaminophen (TYLENOL) suppository 650 mg  650 mg Rectal Q4H PRN  Deveshwar, Willaim Rayas, MD      . albuterol (PROVENTIL) (2.5 MG/3ML) 0.083% nebulizer solution 2.5 mg  2.5 mg Nebulization Q4H PRN Omar Person, NP   2.5 mg at 04/02/18 0453  . arformoterol (BROVANA) nebulizer solution 15 mcg  15 mcg Nebulization BID Omar Person, NP   15 mcg at 04/02/18 0909  . aspirin EC tablet 325 mg  325 mg Oral Daily Rosalin Hawking, MD   325 mg at 04/02/18 1051   Or  . aspirin suppository 300 mg  300 mg Rectal Daily Rosalin Hawking, MD   300 mg at 03/30/18 0926  . atorvastatin (LIPITOR) tablet 20 mg  20 mg Oral q1800 Rosalin Hawking, MD   20 mg at 04/01/18 1732  . budesonide (PULMICORT) nebulizer solution 0.5 mg  0.5 mg Nebulization BID Hayden Pedro M, NP   0.5 mg at 04/02/18 0909  . carvedilol (COREG) tablet 12.5 mg  12.5 mg Oral BID Rosalin Hawking, MD   12.5 mg at 04/02/18 1051  . chlorhexidine (PERIDEX) 0.12 % solution 15 mL  15 mL Mouth Rinse BID Rosalin Hawking, MD   15 mL at 04/02/18 1052  . enoxaparin (LOVENOX) injection 40 mg  40 mg Subcutaneous Daily Alvira Philips, [place]   40 mg at 04/02/18 1052  . furosemide (LASIX) tablet 40 mg  40 mg Oral Daily Margaretha Seeds, MD   40 mg at 04/02/18 1052  . insulin aspart (novoLOG) injection 0-15 Units  0-15 Units Subcutaneous TID WC Mannam, Praveen, MD   8 Units at 04/02/18 1214  . insulin aspart (novoLOG) injection 0-5 Units  0-5 Units Subcutaneous QHS Mannam, Praveen, MD   5 Units at 03/31/18 2140  . MEDLINE mouth rinse  15 mL Mouth Rinse q12n4p Rosalin Hawking, MD   15 mL at 04/02/18 1215  . pantoprazole (PROTONIX) injection 40 mg  40 mg Intravenous QHS Vonzella Nipple, NP   40 mg at 04/01/18 2206  . polyethylene glycol (MIRALAX / GLYCOLAX) packet 17 g  17 g Oral Daily Shelly Coss, MD   17 g at 04/02/18 1051  . RESOURCE THICKENUP CLEAR   Oral PRN Garvin Fila, MD      . senna-docusate (Senokot-S) tablet 1 tablet  1 tablet Oral QHS PRN Vonzella Nipple, NP   1 tablet at 03/26/18 2113     Discharge  Medications: Please see discharge summary for a list of discharge medications.  Relevant Imaging Results:  Relevant Lab Results:   Additional [place], LCSW

## 2018-04-02 NOTE — Progress Notes (Signed)
STROKE TEAM PROGRESS NOTE   SUBJECTIVE (INTERVAL HISTORY) Sitting up in the chair. On 15 litres oxygen/min  for pulmonary fibrosis. Neurologically no changes. Continues to have mild right-sided weakness   OBJECTIVE Temp:  [97.6 F (36.4 C)-98.4 F (36.9 C)] 98.4 F (36.9 C) (12/11 1604) Pulse Rate:  [65-82] 70 (12/11 1604) Cardiac Rhythm: Ventricular paced (12/11 1215) Resp:  [17-27] 27 (12/11 1604) BP: (103-148)/(61-89) 148/76 (12/11 1604) SpO2:  [90 %-96 %] 92 % (12/11 1604) Weight:  [77.3 kg] 77.3 kg (12/11 0443)  Recent Labs  Lab 04/01/18 1101 04/01/18 1633 04/01/18 2133 04/02/18 0616 04/02/18 1128  GLUCAP 257* 222* 183* 317* 259*   Recent Labs  Lab 03/27/18 0308 03/27/18 1659 03/28/18 0529 03/29/18 0647 04/01/18 0605 04/02/18 0533  NA 139  --  141 145 143 140  K 3.5  --  3.6 3.5 3.2* 4.1  CL 105  --  106 110 103 101  CO2 24  --  22 28 29 27   GLUCOSE 236*  --  250* 115* 188* 331*  BUN 23  --  25* 26* 31* 29*  CREATININE 0.66  --  0.68 0.85 0.79 0.94  CALCIUM 8.7*  --  8.4* 8.4* 8.5* 8.7*  MG 2.1 2.2  --   --  2.0  --   PHOS 1.7* 2.5  --   --  3.2  --    Recent Labs  Lab 03/27/18 0308 03/28/18 0529 03/29/18 0647 04/01/18 0605 04/02/18 0533  WBC 12.8* 9.8 9.8 16.3* 23.0*  NEUTROABS  --   --   --   --  19.1*  HGB 10.4* 10.4* 10.4* 11.0* 12.4*  HCT 33.1* 32.8* 32.6* 35.0* 38.4*  MCV 91.4 92.1 91.8 92.8 91.6  PLT 174 170 190 245 288    No results found.   TTE - Full study not performed as pt requested study to be stopped   prior to completion; only partial parasternal and no   apical/subcostal images obtained; vigorous LV systolic function;   doppler incomplete.  PHYSICAL EXAM per Dr. Madison Hickman - Well nourished, well developed elderly African-American male  Afebrile. Head is nontraumatic. Neck is supple without bruit.    Cardiac exam no murmur or gallop. Lungs are clear to auscultation. Distal pulses are well felt. Neurological Exam :  -  Awake  alert.  . More oriented today   and follows only simple midline and one-step commands.. Eyes move to both directions, no gaze preference. Pupil 38mm bilaterally, reactive to light, not consistently blinking to visual threat bilaterally but attending objects to both sides.  Bilateral corneal and cough reflex present.  Facial symmetric  Moving all extremities  But mild right hemiparesis 4/5. Mild bilateral proximal lower extremity weakness right greater than left.Marland Kitchen DTR 1+ and no babinski bilaterally. Sensation, coordination and gait not tested.    ASSESSMENT/PLAN Mr. CALI CUARTAS is a 77 y.o. male with history of CHF on pacer, pulmonary fibrosis, HTN, HLD admitted for right side weakness and aphasia. TPA given. 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 -proximal L ICA occlusion with distal embolization to the MCA  CT head left MCA hyperdense sign, b/l hygroma   CTA head and neck - left ICA and MCA occlusion, left VA origin athero, left fetal PCA patent distally  MRI / MRA not able to perform due to pacemaker  CT repeat left MCA  infarct  DSA left ICA and MCA occlusion s/p TICI 3 reperfusion  2D Echo EF 65-70%  Pacemaker interrogation no afib  LDL 47  HgbA1c 7.0  SCDs for VTE prophylaxis  aspirin 81 mg daily prior to admission, now on ASA 325mg .  Therapy recommendations:  CIR. Rehab consulted.  Disposition:  Pending  Acute on chronic hypoxemic Respiratory Failure  Pulmonary Fibrosis Suspected Pulmonary Edema Hx aspiration  Following with pulmonary for PF as outpt  Intubated - extubated- reintubated - self extubated - re-intubated - extubated  On Levaquin for aspiration prevention  CCM on board  CHF with pacer Hx diastolic HF  Followed Dr. Rayann Heman  EF 60-65% in 08/2016  TTE this time EF 65-70%  Pacer interrogation no afib  Diabetes type II  HgbA1c 7.0,  goal <  7.0  Hyperglycemia continued with TF, on steroids solumedrol 60 daily  Home meds - lantus 50 U daily  Added lantus 15U bid and novolog 3U q4h  CBG monitoring  SSI   DM coordinator following - recommends addition of 3u tid w/ meals if steroids continues with >50% meal intake  Hypotension Hx of hypertension . Treated with levophed . On Coreg 12.5 - continue . Hold cozaar and lasix today  Long term BP goal normotensive  Hyperlipidemia  Home meds:  lipitor 20   LDL 47, goal < 70  Now on lipitor 20  Continue statin at discharge  Gross hematuria, resolved  Likely due to uretal trauma  Difficulty foley insertion - urology put in catheter  Hematuria resolved   Other Stroke Risk Factors  Advanced age  Other Active Problems  Hyperkalemia, resolved 3.5  Leukocytosis, resolved WBC 9.8  Hospital day # 10    Continue weaning oxygen as per critical care team. Patient will likely need to be transferred to  reahab when o 2 requirements go down.t. Discuss with Dr.Nettey  . who agrees with plan.greater than 50% time during this 25 minute visit was spent on counseling and coordination of care about his stroke and discussion with care team.D/W Dr Herbie Drape Stroke team will sign off. Kindly call for questions. Follow-up as an outpatient in stroke clinic in 6 weeks     Antony Contras, Jette Pager: [phone] 04/02/2018 5:11 PM   To contact Stroke Continuity provider, please refer to [email or web address] After hours, contact General Neurology

## 2018-04-02 NOTE — Progress Notes (Signed)
Patient on 15L HFNC.  RT placed patient on breathing treatment through nebulizer mask.  When RT removed HFNC patients sats dropped to 78%.  RT immediately discontinued treatment and placed patient on NRB.  Patients sats 99.  RT placed patient back on 15L HFNC.  Patient vitals stable.

## 2018-04-02 NOTE — Progress Notes (Signed)
Called to patients room due to patient desating in low 80's pt was found with HFNC off he removed himself during his sleep.  Pt placed on 100% NRB mask and given PRN Albuterol neb SP02 now 96 % pt will drop if taken off NRB at this time.  I would suggest leaving him on until he regains his reserve and then try back on HFNC.

## 2018-04-02 NOTE — Progress Notes (Signed)
Pt found to have HFNC off, labored breathing, tachypnea, o2 SATS 47%, replaced HFNC, o2 SATS increased to 70's, pt placed on 100% NRB at this time, o2 SATS maintained in high high 80's, RT notified and came to assess pt. Terry Peck

## 2018-04-02 NOTE — Progress Notes (Signed)
Physical Therapy Treatment Patient Details Name: Terry Peck MRN: [id number] DOB: Jan 12, 1941 Today's Date: 04/02/2018    History of Present Illness Terry Peck is an 77 y.o. male HTN, DM, pulmonary fibrosis, HLD, CHF who presented to [facility] ED as a code stroke with c/o of right facial droop, right sided weakness and aphasia. 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 Intubated 12/1, extubated 12/2, then re-intubated 12/3, self extubated later on 12/3, emergently re-intubated on 12/3.Terry Peck Pt extubated on 12/6    PT Comments    Mobility significantly limited by cardiopulmonary status. Pt taken off 100% NRB mask at breakfast this morning. At time of session he was on 15 L O2 via HFNC with SpO2 90% in bed at rest. Pt transitioned to standing bedside with RW. After standing approx. 30 sec pt with noted SOB and desat to 76%. Pt returned to supine in bed. After 5-minute recovery time, SpO2 88% at rest in bed. PT to continue attempts to progress mobility as pt tolerates.     Follow Up Recommendations  CIR     Equipment Recommendations  3in1 (PT)    Recommendations for Other Services       Precautions / Restrictions Precautions Precautions: Fall;Other (comment) Precaution Comments: watch sats Required Braces or Orthoses: Knee Immobilizer - Left    Mobility  Bed Mobility Overal bed mobility: Needs Assistance Bed Mobility: Supine to Sit;Sit to Supine     Supine to sit: Supervision Sit to supine: Supervision   General bed mobility comments: supervision for safety, increased time and effort  Transfers Overall transfer level: Needs assistance Equipment used: Rolling walker (2 wheeled);None Transfers: Sit to/from Stand Sit to Stand: Min assist         General transfer comment: min assist for safety and balance   Ambulation/Gait             General Gait Details: unable to ambulate due to desat   Stairs             Wheelchair Mobility    Modified Rankin  (Stroke Patients Only) Modified Rankin (Stroke Patients Only) Pre-Morbid Rankin Score: No symptoms Modified Rankin: Moderately severe disability     Balance Overall balance assessment: Needs assistance Sitting-balance support: Feet supported Sitting balance-Leahy Scale: Good Sitting balance - Comments: close supervision     Standing balance-Leahy Scale: Poor Standing balance comment: reliant on external support                            Cognition Arousal/Alertness: Awake/alert Behavior During Therapy: WFL for tasks assessed/performed;Impulsive(mildly impulsive) Overall Cognitive Status: Impaired/Different from baseline Area of Impairment: Memory;Following commands;Safety/judgement;Awareness;Problem solving                     Memory: Decreased short-term memory Following Commands: Follows one step commands with increased time;Follows one step commands inconsistently Safety/Judgement: Decreased awareness of safety;Decreased awareness of deficits Awareness: Emergent Problem Solving: Slow processing;Difficulty sequencing;Requires verbal cues;Requires tactile cues        Exercises      General Comments General comments (skin integrity, edema, etc.): 15 L O2 via HFNC      Pertinent Vitals/Pain Pain Assessment: No/denies pain    Home Living                      Prior Function            PT Goals (current goals can now be  found in the care plan section) Acute Rehab PT Goals Patient Stated Goal: not stated PT Goal Formulation: With patient/family Time For Goal Achievement: 2018/05/09 Potential to Achieve Goals: Good Progress towards PT goals: Not progressing toward goals - comment(increased O2 requirements)    Frequency    Min 4X/week      PT Plan Current plan remains appropriate    Co-evaluation              AM-PAC PT "6 Clicks" Mobility   Outcome Measure  Help needed turning from your back to your side while in a flat  bed without using bedrails?: A Little Help needed moving from lying on your back to sitting on the side of a flat bed without using bedrails?: A Little Help needed moving to and from a bed to a chair (including a wheelchair)?: A Little Help needed standing up from a chair using your arms (e.g., wheelchair or bedside chair)?: A Little Help needed to walk in hospital room?: A Lot Help needed climbing 3-5 steps with a railing? : A Lot 6 Click Score: 16    End of Session Equipment Utilized During Treatment: Oxygen Activity Tolerance: Treatment limited secondary to medical complications (Comment)(cardiopulmonary status (desat on 15 L)) Patient left: in bed;with call bell/phone within reach;with bed alarm set;with SCD's reapplied Nurse Communication: Mobility status PT Visit Diagnosis: Unsteadiness on feet (R26.81);Difficulty in walking, not elsewhere classified (R26.2)     Time: 3875-6433 PT Time Calculation (min) (ACUTE ONLY): 16 min  Charges:  $Therapeutic Activity: 8-22 mins                     Lorrin Goodell, PT  Office # [phone] Pager [phone]    Lorriane Shire 04/02/2018, 1:05 PM

## 2018-04-02 NOTE — Progress Notes (Signed)
NAME:  Terry Peck, MRN:  [id number], DOB:  01-22-1941, LOS: 85 ADMISSION DATE:  03/23/2018, CONSULTATION DATE:  03/23/2018 REFERRING MD:  Dr. 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 S/p tPA and reperfusion. PCCM consulted for ventilation management.  Past Medical History  Pulmonary fibrosis ( did not want aggressive treatment), HTN, DM, chronic diastolic heart failure   Significant Hospital Events   12/1 > Admitted for weakness and aphasia. Found with left ICA and Left M2 occlusion s/p tPA and revascularization 12/2 > Extubated without complication 78/2 > Worsening respiratory status requiring re-intubation. Patient self-extubated and required emergent re-intubation 12/6 Extubated to BiPAP 12/7 Transitioned to humidified high flow 12/8 Tolerating Laureles 12/9>> Transferred to 3W rehab 12/10>> Weaning oxygen   Consults:  Neurology 12/1 PCCM 12/1  Urology 12/3  Procedures:  --ETT 12/1 >> 12/2, 12/3 >> 12/6 --Reintubated 12/3>>Self-extubated on same day>>Reintubated for hypoxemia  Significant Diagnostic Tests:  CT Head 12/1 > 1. Hyperdense terminal left internal carotid artery and left MCA compatible with thrombosis. No evidence of acute infarct or Hemorrhage Atrophy and chronic microvascular ischemic changes in the white matter. CTA Head/Neck 12/1 > 1. 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 Reconstitution of the supraclinoid left internal carotid artery through the anterior communicating artery. There is clot in the left M1 and M2 segments with decreased blood flow. There is occlusion of the posterior division of the left middle cerebral artery due to Clot. 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 Moderate stenosis  origin of left vertebral artery Probable fetal origin left posterior cerebral artery which is supplied from a small hypoplastic left P1 segment.  Chest x-ray 03/27/2018- Cardiomegaly, mild vascular congestion. TTE 12/3 EF 65-70%. PA peak pressure:54 mmHg I reviewed the images personally.  Micro Data:  Tracheal Asp 12/1, 12/3 >> Normal Resp flore  Antimicrobials:  Levofloxacin 12/3 >>12/7  Interim history/subjective:  Continues on high flow nasal cannula Unable to wean due to desaturations.  Objective   Blood pressure 103/69, pulse 75, temperature 97.9 F (36.6 C), temperature source Axillary, resp. rate (!) 21, height 5\' 7"  (1.702 m), weight 77.3 kg, SpO2 93 %.       No intake or output data in the 24 hours ending 04/02/18 1356 Filed Weights   03/28/18 0500 04/01/18 0500 04/02/18 0443  Weight: 84.2 kg 75.9 kg 77.3 kg    Examination: Gen:      Chronically ill-appearing HEENT:  EOMI, sclera anicteric Neck:     No masses; no thyromegaly Lungs:    Bibasilar crackles CV:         Regular rate and rhythm; no murmurs Abd:      + bowel sounds; soft, non-tender; no palpable masses, no distension Ext:    No edema; adequate peripheral perfusion Skin:      Warm and dry; no rash Neuro:, Alert, moves all extremities.  Resolved Hospital Problem list   Hyperkalemia Hematuria secondary to traumatic Foley in setting of TPA Hypotension secondary to sedation  Assessment & Plan:   #Acute on chronic hypoxemic respiratory failure #Pulmonary Fibrosis Exacerbation #Suspected pulmonary edema #Hx of Aspiration  Dr. Lenna Gilford is his pulmonologist. On 2 L via  at home. Recent TTE suggestive of PH. 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  Patient had previously declined treatment for pulmonary fibrosis. Plan  Wean  O2 as tolerated sat goals are 90-92% Pulmicort, Brovana.  Albuterol as needed Finished course of solumedrol and lefofloxacin PPI for acid reflux. Bipap as needed at night. Aggressive  pulmonary toilet and mobilization Honey thick liquids/ dysphagia diet Get high res CT for revaluation of lungs  #Left MCA infarct secondary to left ICA and left M2 occlusion s/p tPA and revascularization on 12/1 Plan  Management per Neurology. BP goal <140 Continue ASA PT/OT-recommends inpatient rehab on discharge  #HTN #S/P Bi-V pacer/ICD, Followed by [facility]  #H/O G1 Diastolic HF, HLD Plan  Telemetry BP goal less than 140 post procedure per neurology Continue home Lipitor  Continue Coreg, lasix   Best practice:  Diet: Dysphagia diet Pain/Anxiety/Delirium protocol: NA VAP protocol: NA DVT prophylaxis: SCD, Lovenox GI prophylaxis: PPI Glucose control: SSI Mobility: PT/OT Code Status: Partial code. No intubation Family Communication: No family at bedside 12/9 Disposition: SDU  Marshell Garfinkel MD [facility] 04/02/2018, 1:57 PM

## 2018-04-02 NOTE — Progress Notes (Signed)
Patient Oxygen sat is in high 90's on the non rebreather mask.  Patient is swtiched from nonrebreather mask to High Flow Nasal Cannula at 15L per min for breakfast.  Oxygen sat at mid 80's to 90's.

## 2018-04-02 NOTE — Progress Notes (Signed)
PROGRESS NOTE    Terry Peck  WCB:[id number] DOB: 1940/06/13 DOA: 03/23/2018 PCP: Biagio Borg, MD   Brief Narrative: Terry Peck is a 77 y.o. male with a history of smoking, pulmonary fibrosis, chronic systolic heart failure s/p AICD, diabetes mellitus type 2. Patient presented as a code stroke and found to have a left M2 occlusion with evidence of left ICA stroke. Patient underwent tPA, angiogram with complete revascularization and required intubation. He was extubated and reintubated 2 times before final extubation on 12/6. Plan for discharge to CIR when medically ready.   Assessment & Plan:   Principal Problem:   Stroke [facility]) Active Problems:   Middle cerebral artery embolism, left   Acute embolic stroke (HCC)   Hypoxia   Pulmonary fibrosis (HCC)   Dysphasia, post-stroke   Dysphagia, post-stroke   Diabetes mellitus type 2 in nonobese [facility])   Essential hypertension   Hypokalemia   Acute respiratory failure ([place])   Acute ischemic stroke Left MCA 2/2 left ICA/M2 occlusion. S/p tPA and angiogram with complete revascularization. -Neurology recommendations: -PT/OT recommendations: CIR -Continue aspirin and Lipitor  Acute on chronic respiratory failure with hypoxia Pulmonary fibrosis Per report, patient is on 2L at home via nasal canula. Required intubation, now extubated. Worsened requirement up to 15 L via HFNC currently. Pulmonology on board. Completed course of Levaquin and Solo-medrol. -Pulmonology recommendations -Continue Pulmicort, Brovana, albuterol  Essential hypertension -Continue Coreg  Diabetes mellitus, type 2 -Continue SSI  Chronic systolic and diastolic heart failure S/p AICD. Ejection fraction improved. Currently stable.  Leukocytosis In setting of steroids. No fevers. -Watch WBC trend   DVT prophylaxis: Lovenox Code Status:   Code Status: Partial Code Family Communication: None at bedside Disposition Plan: Discharge to CIR once  respiratory status improved   Consultants:   Neurology  Interventional radiology  Pulmonology/critical care medicine  Procedures:   Complete revascularization  Intubation  Antimicrobials:  Levaquin (12/3>>12/7)    Subjective: No dyspnea or chest pain. No issues overnight.  Objective: Vitals:   04/02/18 0443 04/02/18 0815 04/02/18 0912 04/02/18 1204  BP:  135/89  103/69  Pulse:  82  75  Resp:  20  (!) 21  Temp:  97.8 F (36.6 C)  97.9 F (36.6 C)  TempSrc:  Axillary  Axillary  SpO2:  96% 93% 93%  Weight: 77.3 kg     Height:       No intake or output data in the 24 hours ending 04/02/18 1620 Filed Weights   03/28/18 0500 04/01/18 0500 04/02/18 0443  Weight: 84.2 kg 75.9 kg 77.3 kg    Examination:  General exam: Appears calm and comfortable Respiratory system: Diffuse rales. Respiratory effort normal with tachypnea. Cardiovascular system: S1 & S2 heard, RRR. Gastrointestinal system: Abdomen is nondistended, soft and nontender. No organomegaly or masses felt. Normal bowel sounds heard. Central nervous system: Alert and oriented. No focal neurological deficits. Extremities: No edema. No calf tenderness Skin: No cyanosis. No rashes Psychiatry: Judgement and insight appear normal. Mood & affect appropriate.     Data Reviewed: I have personally reviewed following labs and imaging studies  CBC: Recent Labs  Lab 03/27/18 0308 03/28/18 0529 03/29/18 0647 04/01/18 0605 04/02/18 0533  WBC 12.8* 9.8 9.8 16.3* 23.0*  NEUTROABS  --   --   --   --  19.1*  HGB 10.4* 10.4* 10.4* 11.0* 12.4*  HCT 33.1* 32.8* 32.6* 35.0* 38.4*  MCV 91.4 92.1 91.8 92.8 91.6  PLT 174 170 190 245 288  Basic Metabolic Panel: Recent Labs  Lab 03/26/18 1639 03/27/18 0308 03/27/18 1659 03/28/18 0529 03/29/18 0647 04/01/18 0605 04/02/18 0533  NA  --  139  --  141 145 143 140  K  --  3.5  --  3.6 3.5 3.2* 4.1  CL  --  105  --  106 110 103 101  CO2  --  24  --  22 28 29 27     GLUCOSE  --  236*  --  250* 115* 188* 331*  BUN  --  23  --  25* 26* 31* 29*  CREATININE  --  0.66  --  0.68 0.85 0.79 0.94  CALCIUM  --  8.7*  --  8.4* 8.4* 8.5* 8.7*  MG 2.0 2.1 2.2  --   --  2.0  --   PHOS 2.1* 1.7* 2.5  --   --  3.2  --    GFR: Estimated Creatinine Clearance: 61.5 mL/min (by C-G formula based on SCr of 0.94 mg/dL). Liver Function Tests: No results for input(s): AST, ALT, ALKPHOS, BILITOT, PROT, ALBUMIN in the last 168 hours. No results for input(s): LIPASE, AMYLASE in the last 168 hours. No results for input(s): AMMONIA in the last 168 hours. Coagulation Profile: No results for input(s): INR, PROTIME in the last 168 hours. Cardiac Enzymes: No results for input(s): CKTOTAL, CKMB, CKMBINDEX, TROPONINI in the last 168 hours. BNP (last 3 results) No results for input(s): PROBNP in the last 8760 hours. HbA1C: No results for input(s): HGBA1C in the last 72 hours. CBG: Recent Labs  Lab 04/01/18 1101 04/01/18 1633 04/01/18 2133 04/02/18 0616 04/02/18 1128  GLUCAP 257* 222* 183* 317* 259*   Lipid Profile: No results for input(s): CHOL, HDL, LDLCALC, TRIG, CHOLHDL, LDLDIRECT in the last 72 hours. Thyroid Function Tests: No results for input(s): TSH, T4TOTAL, FREET4, T3FREE, THYROIDAB in the last 72 hours. Anemia Panel: No results for input(s): VITAMINB12, FOLATE, FERRITIN, TIBC, IRON, RETICCTPCT in the last 72 hours. Sepsis Labs: No results for input(s): PROCALCITON, LATICACIDVEN in the last 168 hours.  Recent Results (from the past 240 hour(s))  Culture, respiratory (non-expectorated)     Status: None   Collection Time: 03/23/18  9:52 PM  Result Value Ref Range Status   Specimen Description TRACHEAL ASPIRATE  Final   Special Requests NONE  Final   Gram Stain   Final    RARE WBC PRESENT,BOTH PMN AND MONONUCLEAR NO ORGANISMS SEEN    Culture   Final    RARE Consistent with normal respiratory flora. Performed at [facility], [place] [address],  [place], [place] [address]    Report Status 03/26/2018 FINAL  Final  MRSA PCR Screening     Status: None   Collection Time: 03/23/18 10:01 PM  Result Value Ref Range Status   MRSA by PCR NEGATIVE NEGATIVE Final    Comment:        The GeneXpert MRSA Assay (FDA approved for NASAL specimens only), is one component of a comprehensive MRSA colonization surveillance program. It is not intended to diagnose MRSA infection nor to guide or monitor treatment for MRSA infections. Performed at [facility], [place] [address] [place], [place] [address]   Culture, respiratory (non-expectorated)     Status: None   Collection Time: 03/25/18  8:31 PM  Result Value Ref Range Status   Specimen Description TRACHEAL ASPIRATE  Final   Special Requests NONE  Final   Gram Stain   Final  ABUNDANT WBC PRESENT,BOTH PMN AND MONONUCLEAR MODERATE GRAM POSITIVE COCCI MODERATE GRAM NEGATIVE COCCI MODERATE GRAM NEGATIVE RODS    Culture   Final    ABUNDANT Consistent with normal respiratory flora. Performed at [facility], [place] [address] [place], [place] [address]    Report Status 03/28/2018 FINAL  Final         Radiology Studies: Dg Chest Port 1 View  Result Date: 04/01/2018 CLINICAL DATA:  77 year old male status post emergent large vessel occlusion (left ICA, left MCA) and endovascular treatment. respiratory failure. Pulmonary fibrosis, oxygen-dependent. EXAM: PORTABLE CHEST 1 VIEW COMPARISON:  03/27/2018 and earlier. FINDINGS: Portable AP semi upright view at 0653 hours. Extubated and enteric tube removed. Stable left chest AICD. Lung volumes remain lower than baseline with chronic reticular opacity in both lungs. No pneumothorax. Mild lung base atelectasis suspected. Stable cardiac size and mediastinal contours. There is oral contrast in the transverse colon. IMPRESSION: 1. Extubated and enteric tube removed. 2. Lower lung volumes with atelectasis superimposed on chronic lung disease. 3. Oral  contrast in the transverse colon. Electronically Signed   By: Genevie Ann M.D.   On: 04/01/2018 09:11        Scheduled Meds: .  stroke: mapping our early stages of recovery book   Does not apply Once  . arformoterol  15 mcg Nebulization BID  . aspirin EC  325 mg Oral Daily   Or  . aspirin  300 mg Rectal Daily  . atorvastatin  20 mg Oral q1800  . budesonide (PULMICORT) nebulizer solution  0.5 mg Nebulization BID  . carvedilol  12.5 mg Oral BID  . chlorhexidine  15 mL Mouth Rinse BID  . enoxaparin (LOVENOX) injection  40 mg Subcutaneous Daily  . furosemide  40 mg Oral Daily  . insulin aspart  0-15 Units Subcutaneous TID WC  . insulin aspart  0-5 Units Subcutaneous QHS  . mouth rinse  15 mL Mouth Rinse q12n4p  . pantoprazole  40 mg Oral QHS  . polyethylene glycol  17 g Oral Daily   Continuous Infusions: . sodium chloride Stopped (03/30/18 1316)  . sodium chloride 10 mL/hr at 03/28/18 1900     LOS: 10 days     [name], MD [facility] 04/02/2018, 4:20 PM  If 7PM-7AM, please contact night-coverage [email or web address]

## 2018-04-02 NOTE — Care Management Important Message (Signed)
Important Message  Patient Details  Name: Terry Peck MRN: [id number] Date of Birth: 1941-01-20   Medicare Important Message Given:  Yes    Orbie Pyo 04/02/2018, 11:17 AM

## 2018-04-02 NOTE — Progress Notes (Signed)
Inpatient Rehabilitation Admissions Coordinator  Patient currently not at a level to pursue inpt rehab venue. I will follow. Noted respiratory issues.  Danne Baxter, RN, MSN Rehab Admissions Coordinator [phone] 04/02/2018 12:12 PM

## 2018-04-02 NOTE — Progress Notes (Signed)
Pt transported off unit to CT; spoke with RT and she recommended switching pt from HFNC to non-re breather. Pt sating in the 90's to 95%. Delia Heady RN

## 2018-04-03 LAB — GLUCOSE, CAPILLARY
Glucose-Capillary: 290 mg/dL — ABNORMAL HIGH (ref 70–99)
Glucose-Capillary: 328 mg/dL — ABNORMAL HIGH (ref 70–99)
Glucose-Capillary: 281 mg/dL — ABNORMAL HIGH (ref 70–99)
Glucose-Capillary: 322 mg/dL — ABNORMAL HIGH (ref 70–99)

## 2018-04-03 LAB — BLOOD GAS, ARTERIAL
Acid-Base Excess: 2.4 mmol/L — ABNORMAL HIGH (ref 0.0–2.0)
Bicarbonate: 25.8 mmol/L (ref 20.0–28.0)
Drawn by: 519031
O2 Content: 15 L/min
O2 Saturation: 93.2 %
Patient temperature: 98.6
pCO2 arterial: 35.3 mmHg (ref 32.0–48.0)
pH, Arterial: 7.476 — ABNORMAL HIGH (ref 7.350–7.450)
pO2, Arterial: 66.7 mmHg — ABNORMAL LOW (ref 83.0–108.0)

## 2018-04-03 LAB — CBC
HCT: 40.3 % (ref 39.0–52.0)
Hemoglobin: 12.6 g/dL — ABNORMAL LOW (ref 13.0–17.0)
MCH: 28.8 pg (ref 26.0–34.0)
MCHC: 31.3 g/dL (ref 30.0–36.0)
MCV: 92.2 fL (ref 80.0–100.0)
NRBC: 0 % (ref 0.0–0.2)
Platelets: 283 10*3/uL (ref 150–400)
RBC: 4.37 MIL/uL (ref 4.22–5.81)
RDW: 15.3 % (ref 11.5–15.5)
WBC: 25.6 10*3/uL — ABNORMAL HIGH (ref 4.0–10.5)

## 2018-04-03 LAB — PROCALCITONIN: Procalcitonin: <0.1 ng/mL

## 2018-04-03 MED ORDER — IPRATROPIUM-ALBUTEROL 0.5-2.5 (3) MG/3ML IN SOLN
3.0000 mL | Freq: Three times a day (TID) | RESPIRATORY_TRACT | Status: DC
  Administered 2018-04-03 – 2018-04-04 (×2): 3 mL via RESPIRATORY_TRACT
  Filled 2018-04-03 (×2): qty 3

## 2018-04-03 MED ORDER — METHYLPREDNISOLONE SODIUM SUCC 40 MG IJ SOLR
40.0000 mg | Freq: Four times a day (QID) | INTRAMUSCULAR | Status: DC
  Administered 2018-04-03 – 2018-04-05 (×8): 40 mg via INTRAVENOUS
  Filled 2018-04-03 (×8): qty 1

## 2018-04-03 MED ORDER — INSULIN GLARGINE 100 UNIT/ML ~~LOC~~ SOLN
10.0000 [IU] | Freq: Every day | SUBCUTANEOUS | Status: DC
  Administered 2018-04-03 – 2018-04-04 (×2): 10 [IU] via SUBCUTANEOUS
  Filled 2018-04-03 (×2): qty 0.1

## 2018-04-03 NOTE — Progress Notes (Signed)
NAME:  Terry Peck, MRN:  [id number], DOB:  09/27/1940, LOS: 49 ADMISSION DATE:  03/23/2018, CONSULTATION DATE:  03/23/2018 REFERRING MD:  Dr. 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 S/p tPA and reperfusion. PCCM consulted for ventilation management.  Past Medical History  Pulmonary fibrosis ( did not want aggressive treatment), HTN, DM, chronic diastolic heart failure   Significant Hospital Events   12/1 > Admitted for weakness and aphasia. Found with left ICA and Left M2 occlusion s/p tPA and revascularization 12/2 > Extubated without complication 93/8 > Worsening respiratory status requiring re-intubation. Patient self-extubated and required emergent re-intubation 12/6 Extubated to BiPAP 12/7 Transitioned to humidified high flow 12/8 Tolerating [place] 12/9>> Transferred to 3W rehab 12/10>> Weaning oxygen   Consults:  Neurology 12/1 PCCM 12/1  Urology 12/3  Procedures:  --ETT 12/1 >> 12/2, 12/3 >> 12/6 --Reintubated 12/3>>Self-extubated on same day>>Reintubated for hypoxemia  Significant Diagnostic Tests:  CT Head 12/1 > 1. Hyperdense terminal left internal carotid artery and left MCA compatible with thrombosis. No evidence of acute infarct or Hemorrhage Atrophy and chronic microvascular ischemic changes in the white matter.  CTA Head/Neck 12/1 > 1. 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 Reconstitution of the supraclinoid left internal carotid artery through the anterior communicating artery. There is clot in the left M1 and M2 segments with decreased blood flow. There is occlusion of the posterior division of the left middle cerebral artery due to Clot. 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 Moderate  stenosis origin of left vertebral artery Probable fetal origin left posterior cerebral artery which is supplied from a small hypoplastic left P1 segment.  High-resolution CT chest 12/11- widespread areas of groundglass attenuation of septal thickening reticulation, traction bronchiectasis with mild honeycombing.  Probable UIP pattern with progression compared to 2016.  I have reviewed the images personally.  Micro Data:  Tracheal Asp 12/1, 12/3 >> Normal Resp flora  Antimicrobials:  Levofloxacin 12/3 >>12/7  Interim history/subjective:  Continues on high flow nasal cannula.  Unable to wean due to desaturation.  Objective   Blood pressure 112/64, pulse 71, temperature 98.9 F (37.2 C), temperature source Axillary, resp. rate (!) 29, height 5\' 7"  (1.702 m), weight 77.3 kg, SpO2 93 %.        Intake/Output Summary (Last 24 hours) at 04/03/2018 1512 Last data filed at 04/03/2018 1251 Gross per 24 hour  Intake 720 ml  Output -  Net 720 ml   Filed Weights   03/28/18 0500 04/01/18 0500 04/02/18 0443  Weight: 84.2 kg 75.9 kg 77.3 kg    Examination: Gen:      No acute distress HEENT:  EOMI, sclera anicteric Neck:     No masses; no thyromegaly Lungs:   Basal crackles. CV:         Regular rate and rhythm; no murmurs Abd:      + bowel sounds; soft, non-tender; no palpable masses, no distension Ext:    No edema; adequate peripheral perfusion Skin:      Warm and dry; no rash Neuro: Awake, Confused.  Resolved Hospital Problem list   Hyperkalemia Hematuria secondary to traumatic Foley in setting of TPA Hypotension secondary to sedation  Assessment & Plan:  Acute on chronic hypoxemic respiratory failure  Pulmonary Fibrosis Exacerbation Hx of Aspiration   Dr. Lenna Gilford is his pulmonologist. On 2 L via [place] at home. Recent TTE suggestive of PH. 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 Patient had previously declined treatment for pulmonary fibrosis. 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 She agrees with the plan.   Continue nebs Finished levofloxacin.  Check procalcitonin.  No need to restart antibiotics if PCT is low. Continue pulmonary toilet, mobilization Aspiration precautions with honey thick liquids/ dysphagia diet  Left MCA infarct secondary to left ICA and left M2 occlusion s/p tPA and revascularization on 12/1 Plan: Management per Neurology. BP goal <140 Continue ASA PT/OT-recommends inpatient rehab on discharge  Marshell Garfinkel MD [facility] 04/03/2018, 3:12 PM

## 2018-04-03 NOTE — Progress Notes (Signed)
PROGRESS NOTE    Terry Peck  ZOX:[id number] DOB: 1940-11-04 DOA: 03/23/2018 PCP: Biagio Borg, MD   Brief Narrative: Terry Peck is a 77 y.o. male with a history of smoking, pulmonary fibrosis, chronic systolic heart failure s/p AICD, diabetes mellitus type 2. Patient presented as a code stroke and found to have a left M2 occlusion with evidence of left ICA stroke. Patient underwent tPA, angiogram with complete revascularization and required intubation. He was extubated and reintubated 2 times before final extubation on 12/6. Plan for discharge to CIR when medically ready.   Assessment & Plan:   Principal Problem:   Stroke [facility]) Active Problems:   Middle cerebral artery embolism, left   Acute embolic stroke (HCC)   Hypoxia   Pulmonary fibrosis (HCC)   Dysphasia, post-stroke   Dysphagia, post-stroke   Diabetes mellitus type 2 in nonobese [facility])   Essential hypertension   Hypokalemia   Acute respiratory failure ([place])   Acute ischemic stroke Left MCA 2/2 left ICA/M2 occlusion. S/p tPA and angiogram with complete revascularization. -Neurology recommendations: -PT/OT recommendations: CIR -Continue aspirin and Lipitor  Acute on chronic respiratory failure with hypoxia Pulmonary fibrosis Per report, patient is on 2L at home via nasal canula. Required intubation, now extubated. Worsened requirement up to 15 L via HFNC currently. Pulmonology on board. Completed course of Levaquin and Solo-medrol. -Pulmonology recommendations: Restart solu-medrol; palliative care consult -Continue Pulmicort, Brovana, albuterol  Somnolence Likely secondary to respiratory insult. -ABG -Continue treatment per above  Essential hypertension -Continue Coreg  Diabetes mellitus, type 2 -Continue SSI  Chronic systolic and diastolic heart failure S/p AICD. Ejection fraction improved. Currently stable.  Leukocytosis In setting of steroids. No fevers. Obtained procalcitonin which was  undetectable -Watch WBC trend   DVT prophylaxis: Lovenox Code Status:   Code Status: Partial Code Family Communication: None at bedside Disposition Plan: Discharge to CIR if respiratory status improve. May need to transition to comfort care if no improvement pending discussions with palliative care   Consultants:   Neurology  Interventional radiology  Pulmonology/critical care medicine  Procedures:   Complete revascularization  Intubation  Antimicrobials:  Levaquin (12/3>>12/7)    Subjective: Somnolent. Awakes to tactile stimulus and answers one question before falling asleep.  Objective: Vitals:   04/03/18 0842 04/03/18 1115 04/03/18 1346 04/03/18 1522  BP: 129/76 112/64  137/75  Pulse: 76 71  77  Resp: (!) 29 (!) 29  (!) 35  Temp: 99 F (37.2 C) 98.9 F (37.2 C)  98.3 F (36.8 C)  TempSrc: Oral Axillary  Oral  SpO2: 95% 94% 93% 90%  Weight:      Height:        Intake/Output Summary (Last 24 hours) at 04/03/2018 1745 Last data filed at 04/03/2018 1251 Gross per 24 hour  Intake 720 ml  Output -  Net 720 ml   Filed Weights   03/28/18 0500 04/01/18 0500 04/02/18 0443  Weight: 84.2 kg 75.9 kg 77.3 kg    Examination:  General exam: Appears calm and comfortable  Respiratory system: Decreased breath sounds with bilateral rales. Respiratory effort slightly increased. Cardiovascular system: S1 & S2 heard, RRR. No murmurs, rubs, gallops or clicks. Gastrointestinal system: Abdomen is nondistended, soft and nontender. Normal bowel sounds heard. Central nervous system: Somnolent. Arouses to tactile stimuli. Extremities: No edema. No calf tenderness Skin: No cyanosis. No rashes   Data Reviewed: I have personally reviewed following labs and imaging studies  CBC: Recent Labs  Lab 03/28/18 0529 03/29/18 8119  04/01/18 0605 04/02/18 0533 04/03/18 0519  WBC 9.8 9.8 16.3* 23.0* 25.6*  NEUTROABS  --   --   --  19.1*  --   HGB 10.4* 10.4* 11.0* 12.4* 12.6*   HCT 32.8* 32.6* 35.0* 38.4* 40.3  MCV 92.1 91.8 92.8 91.6 92.2  PLT 170 190 245 288 032   Basic Metabolic Panel: Recent Labs  Lab 03/28/18 0529 03/29/18 0647 04/01/18 0605 04/02/18 0533  NA 141 145 143 140  K 3.6 3.5 3.2* 4.1  CL 106 110 103 101  CO2 22 28 29 27   GLUCOSE 250* 115* 188* 331*  BUN 25* 26* 31* 29*  CREATININE 0.68 0.85 0.79 0.94  CALCIUM 8.4* 8.4* 8.5* 8.7*  MG  --   --  2.0  --   PHOS  --   --  3.2  --    GFR: Estimated Creatinine Clearance: 61.5 mL/min (by C-G formula based on SCr of 0.94 mg/dL). Liver Function Tests: No results for input(s): AST, ALT, ALKPHOS, BILITOT, PROT, ALBUMIN in the last 168 hours. No results for input(s): LIPASE, AMYLASE in the last 168 hours. No results for input(s): AMMONIA in the last 168 hours. Coagulation Profile: No results for input(s): INR, PROTIME in the last 168 hours. Cardiac Enzymes: No results for input(s): CKTOTAL, CKMB, CKMBINDEX, TROPONINI in the last 168 hours. BNP (last 3 results) No results for input(s): PROBNP in the last 8760 hours. HbA1C: No results for input(s): HGBA1C in the last 72 hours. CBG: Recent Labs  Lab 04/02/18 1726 04/02/18 2123 04/03/18 0608 04/03/18 1111 04/03/18 1643  GLUCAP 195* 262* 290* 328* 281*   Lipid Profile: No results for input(s): CHOL, HDL, LDLCALC, TRIG, CHOLHDL, LDLDIRECT in the last 72 hours. Thyroid Function Tests: No results for input(s): TSH, T4TOTAL, FREET4, T3FREE, THYROIDAB in the last 72 hours. Anemia Panel: No results for input(s): VITAMINB12, FOLATE, FERRITIN, TIBC, IRON, RETICCTPCT in the last 72 hours. Sepsis Labs: Recent Labs  Lab 04/03/18 1414  PROCALCITON <0.10    Recent Results (from the past 240 hour(s))  Culture, respiratory (non-expectorated)     Status: None   Collection Time: 03/25/18  8:31 PM  Result Value Ref Range Status   Specimen Description TRACHEAL ASPIRATE  Final   Special Requests NONE  Final   Gram Stain   Final    ABUNDANT WBC  PRESENT,BOTH PMN AND MONONUCLEAR MODERATE GRAM POSITIVE COCCI MODERATE GRAM NEGATIVE COCCI MODERATE GRAM NEGATIVE RODS    Culture   Final    ABUNDANT Consistent with normal respiratory flora. Performed at [facility], [place] [address], [place], [place] [address]    Report Status 03/28/2018 FINAL  Final         Radiology Studies: Ct Chest High Resolution  Result Date: 04/03/2018 CLINICAL DATA:  77 year old male with clinical suspicion of interstitial lung disease. Follow-up study. EXAM: CT CHEST WITHOUT CONTRAST TECHNIQUE: Multidetector CT imaging of the chest was performed following the standard protocol without intravenous contrast. High resolution imaging of the lungs, as well as inspiratory and expiratory imaging, was performed. COMPARISON:  Chest CT 12/01/2014. FINDINGS: Cardiovascular: Heart size is normal. There is no significant pericardial fluid, thickening or pericardial calcification. There is aortic atherosclerosis, as well as atherosclerosis of the great vessels of the mediastinum and the coronary arteries, including calcified atherosclerotic plaque in the left main, left anterior descending, left circumflex and right coronary arteries. Calcifications of the aortic valve. Left-sided biventricular pacemaker/AICD with lead tips terminating in the right atrium, near the  right ventricular apex and overlying the lateral wall the left ventricle via the coronary sinus and coronary veins. Mediastinum/Nodes: No pathologically enlarged mediastinal or hilar lymph nodes. Please note that accurate exclusion of hilar adenopathy is limited on noncontrast CT scans. Moderate to large hiatal hernia. No axillary lymphadenopathy. Lungs/Pleura: High-resolution images demonstrate widespread areas of ground-glass attenuation, septal thickening, subpleural reticulation and thickening of the peribronchovascular interstitium. A few scattered areas of mild traction bronchiectasis are noted, most evident in  the right middle lobe and upper lobes of the lungs bilaterally. Mild honeycombing also noted in the dependent portions of the lower lobes of the lungs bilaterally. No acute consolidative airspace disease. No pleural effusions. 6 mm left lower lobe nodule (axial image 57 of series 6), stable dating back to 2016, considered definitively benign. No other definite larger more suspicious appearing pulmonary nodules or masses are noted. Upper Abdomen: Exophytic 5.7 cm low-attenuation lesion in the upper pole the left kidney, not characterized on today's noncontrast CT examination, but statistically likely a cyst. Small calcified gallstones lying dependently in the gallbladder. Aortic atherosclerosis. Musculoskeletal: There are no aggressive appearing lytic or blastic lesions noted in the visualized portions of the skeleton. IMPRESSION: 1. The appearance of the lungs is compatible with interstitial lung disease, with a spectrum of findings considered probable usual interstitial pneumonia (UIP) per current ATS guidelines. Repeat high-resolution chest CT is suggested in 12 months to assess for temporal changes in the appearance of the lung parenchyma. 2. Aortic atherosclerosis, in addition to left main and 3 vessel coronary artery disease. Assessment for potential risk factor modification, dietary therapy or pharmacologic therapy may be warranted, if clinically indicated. 3. There are calcifications of the aortic valve. Echocardiographic correlation for evaluation of potential valvular dysfunction may be warranted if clinically indicated. 4. Moderate to large hiatal hernia. Aortic Atherosclerosis (ICD10-I70.0). Electronically Signed   By: Vinnie Langton M.D.   On: 04/03/2018 08:35        Scheduled Meds: .  stroke: mapping our early stages of recovery book   Does not apply Once  . arformoterol  15 mcg Nebulization BID  . aspirin EC  325 mg Oral Daily   Or  . aspirin  300 mg Rectal Daily  . atorvastatin  20 mg  Oral q1800  . budesonide (PULMICORT) nebulizer solution  0.5 mg Nebulization BID  . carvedilol  12.5 mg Oral BID  . chlorhexidine  15 mL Mouth Rinse BID  . enoxaparin (LOVENOX) injection  40 mg Subcutaneous Daily  . furosemide  40 mg Oral Daily  . insulin aspart  0-15 Units Subcutaneous TID WC  . insulin aspart  0-5 Units Subcutaneous QHS  . insulin glargine  10 Units Subcutaneous Daily  . ipratropium-albuterol  3 mL Nebulization TID  . mouth rinse  15 mL Mouth Rinse q12n4p  . methylPREDNISolone (SOLU-MEDROL) injection  40 mg Intravenous Q6H  . pantoprazole  40 mg Oral QHS  . polyethylene glycol  17 g Oral Daily   Continuous Infusions: . sodium chloride Stopped (03/30/18 1316)  . sodium chloride 10 mL/hr at 03/28/18 1900     LOS: 11 days     [name], MD [facility] 04/03/2018, 5:45 PM  If 7PM-7AM, please contact night-coverage [email or web address]

## 2018-04-03 NOTE — Progress Notes (Signed)
Tele call that patient O2 sat is in the 85. RN checked on the patient and repositioned pt. In bed O2 sat back to 90. Patient is asymptomatic. Will continue to monitor patient.

## 2018-04-03 NOTE — Progress Notes (Signed)
RT NOTES: Called to room by secretary saying patient is in distress. Entered room to find RN at bedside. Patient has been placed on a nonrebreather mask hooked to the [place] high flow water bottle by RN. Removed NRB from patient and placed back on 15 L HFNC. Pt's previous ABG from 1305 (04/03/18) was normal. Patient sats 93% and patient states he is breathing fine. BBS clear to auscultation. Patient does not appear to be in distress, just slightly tachypneic with rate of 30 which has been ongoing this shift and not acute. Educated Therapist, sports on use of NRB and that it cannot be hooked to any water bottles. Will continue to monitor patient's respiratory status.

## 2018-04-03 NOTE — Progress Notes (Addendum)
Inpatient Diabetes Program Recommendations  AACE/ADA: New Consensus Statement on Inpatient Glycemic Control (2015)  Target Ranges:  Prepandial:   less than 140 mg/dL      Peak postprandial:   less than 180 mg/dL (1-2 hours)      Critically ill patients:  140 - 180 mg/dL   Lab Results  Component Value Date   GLUCAP 290 (H) 04/03/2018   HGBA1C 7.0 (H) 03/24/2018    Review of Glycemic Control  Diabetes history: Type 2 DM Outpatient Diabetes medications: Lantus 50 units QD, Actos 30 units QD, Metformin 2000 mg QAM Current orders for Inpatient glycemic control:Novolog 0-15units TID, Novolog 0-5 units QHS  Inpatient Diabetes Program Recommendations:  Consider restarting portion of patient's home basal insulin. Consider Lantus 20 units QD.   Thanks, Bronson Curb, MSN, RNC-OB Diabetes Coordinator [phone] (8a-5p)

## 2018-04-03 NOTE — Progress Notes (Signed)
OT Cancellation Note  Patient Details Name: Terry Peck MRN: [id number] DOB: [birth year]   Cancelled Treatment:    Reason Eval/Treat Not Completed: Other (comment). Per chart review and conversation with MD will hold OT for today. Pt on 15L HFNC and very lethargic today. Will continue to follow.  Tyrone Schimke, OT Acute Rehabilitation Services Pager: [phone] Office: [phone]  04/03/2018, 12:10 PM

## 2018-04-04 DIAGNOSIS — J9601 Acute respiratory failure with hypoxia: Secondary | ICD-10-CM

## 2018-04-04 DIAGNOSIS — Z7189 Other specified counseling: Secondary | ICD-10-CM

## 2018-04-04 DIAGNOSIS — I6300 Cerebral infarction due to thrombosis of unspecified precerebral artery: Secondary | ICD-10-CM

## 2018-04-04 DIAGNOSIS — Z515 Encounter for palliative care: Secondary | ICD-10-CM

## 2018-04-04 LAB — CBC
HCT: 38 % — ABNORMAL LOW (ref 39.0–52.0)
Hemoglobin: 11.9 g/dL — ABNORMAL LOW (ref 13.0–17.0)
MCH: 29 pg (ref 26.0–34.0)
MCHC: 31.3 g/dL (ref 30.0–36.0)
MCV: 92.5 fL (ref 80.0–100.0)
Platelets: 245 10*3/uL (ref 150–400)
RBC: 4.11 MIL/uL — ABNORMAL LOW (ref 4.22–5.81)
RDW: 15.6 % — ABNORMAL HIGH (ref 11.5–15.5)
WBC: 25.8 10*3/uL — ABNORMAL HIGH (ref 4.0–10.5)
nRBC: 0 % (ref 0.0–0.2)

## 2018-04-04 LAB — GLUCOSE, CAPILLARY
Glucose-Capillary: 317 mg/dL — ABNORMAL HIGH (ref 70–99)
Glucose-Capillary: 280 mg/dL — ABNORMAL HIGH (ref 70–99)

## 2018-04-04 MED ORDER — LORAZEPAM 1 MG PO TABS: 1.0000 mg | ORAL_TABLET | ORAL | Status: DC | PRN

## 2018-04-04 MED ORDER — GLYCOPYRROLATE 1 MG PO TABS
1.0000 mg | ORAL_TABLET | ORAL | Status: DC | PRN
  Filled 2018-04-04: qty 1

## 2018-04-04 MED ORDER — HALOPERIDOL LACTATE 5 MG/ML IJ SOLN: 0.5000 mg | INTRAMUSCULAR | Status: DC | PRN

## 2018-04-04 MED ORDER — LORAZEPAM 2 MG/ML PO CONC: 1.0000 mg | ORAL | Status: DC | PRN

## 2018-04-04 MED ORDER — LORAZEPAM 2 MG/ML IJ SOLN: 1.0000 mg | INTRAMUSCULAR | Status: DC | PRN

## 2018-04-04 MED ORDER — MORPHINE SULFATE (PF) 2 MG/ML IV SOLN: 1.0000 mg | INTRAVENOUS | Status: DC | PRN

## 2018-04-04 MED ORDER — HALOPERIDOL LACTATE 2 MG/ML PO CONC
0.5000 mg | ORAL | Status: DC | PRN
  Filled 2018-04-04: qty 0.3

## 2018-04-04 MED ORDER — MORPHINE SULFATE (CONCENTRATE) 10 MG/0.5ML PO SOLN: 5.0000 mg | ORAL | Status: DC | PRN

## 2018-04-04 MED ORDER — GLYCOPYRROLATE 0.2 MG/ML IJ SOLN: 0.2000 mg | INTRAMUSCULAR | Status: DC | PRN

## 2018-04-04 MED ORDER — HALOPERIDOL 0.5 MG PO TABS
0.5000 mg | ORAL_TABLET | ORAL | Status: DC | PRN
  Filled 2018-04-04: qty 1

## 2018-04-04 MED ORDER — MORPHINE SULFATE (CONCENTRATE) 10 MG/0.5ML PO SOLN
5.0000 mg | ORAL | Status: DC | PRN
  Administered 2018-04-06: 5 mg via SUBLINGUAL
  Filled 2018-04-04: qty 0.5

## 2018-04-04 NOTE — Progress Notes (Signed)
PROGRESS NOTE    Terry Peck  CBJ:[id number] DOB: [birth year] DOA: 03/23/2018 PCP: Biagio Borg, MD   Brief Narrative: Terry Peck is a 77 y.o. male with a history of smoking, pulmonary fibrosis, chronic systolic heart failure s/p AICD, diabetes mellitus type 2. Patient presented as a code stroke and found to have a left M2 occlusion with evidence of left ICA stroke. Patient underwent tPA, angiogram with complete revascularization and required intubation. He was extubated and reintubated 2 times before final extubation on 12/6. Plan for discharge to CIR when medically ready.   Assessment & Plan:   Principal Problem:   Stroke [facility]) Active Problems:   Middle cerebral artery embolism, left   Acute embolic stroke (HCC)   Hypoxia   Pulmonary fibrosis (HCC)   Dysphasia, post-stroke   Dysphagia, post-stroke   Diabetes mellitus type 2 in nonobese [facility])   Essential hypertension   Hypokalemia   Acute respiratory failure ([place])   Acute ischemic stroke Left MCA 2/2 left ICA/M2 occlusion. S/p tPA and angiogram with complete revascularization. Patient is comfort care.  Acute on chronic respiratory failure with hypoxia Pulmonary fibrosis Per report, patient is on 2L at home via nasal canula. Required intubation, now extubated. Worsened requirement up to 15 L via HFNC currently. Pulmonology on board. Completed course of Levaquin and Solo-medrol but Solu-medrol restarted. Mild improvement in oxygen needs. Plan is for full comfort care with discharge to residential hospice.  Somnolence Likely secondary to respiratory insult. Improved today.  Essential hypertension -Continue Coreg  Diabetes mellitus, type 2 -Continue SSI  Chronic systolic and diastolic heart failure S/p AICD. Ejection fraction improved. Currently stable. AICD called for deactivation.  Leukocytosis In setting of steroids. No fevers. Obtained procalcitonin which was undetectable. Patient is comfort care  currently   DVT prophylaxis: Lovenox Code Status:   Code Status: DNR Family Communication: None at bedside Disposition Plan: Discharge to hospice house  Consultants:   Neurology  Interventional radiology  Pulmonology/critical care medicine  Procedures:   Complete revascularization  Intubation  Antimicrobials:  Levaquin (12/3>>12/7)   Subjective: Awake. States no chest pain or dyspnea.  Objective: Vitals:   04/04/18 0801 04/04/18 0902 04/04/18 0903 04/04/18 0904  BP: (!) 159/91     Pulse: 71   73  Resp: (!) 25   20  Temp: 98.2 F (36.8 C)     TempSrc: Axillary     SpO2: 100% 98% 98% 98%  Weight:      Height:        Intake/Output Summary (Last 24 hours) at 04/04/2018 1343 Last data filed at 04/04/2018 0000 Gross per 24 hour  Intake 960 ml  Output 300 ml  Net 660 ml   Filed Weights   03/28/18 0500 04/01/18 0500 04/02/18 0443  Weight: 84.2 kg 75.9 kg 77.3 kg    Examination:  General exam: Appears calm and comfortable Respiratory system: Rales bilaterally. Respiratory effort normal. Cardiovascular system: S1 & S2 heard, RRR. No murmurs, rubs, gallops or clicks. Gastrointestinal system: Abdomen is nondistended, soft and nontender. Epigastric mass felt. Normal bowel sounds heard. Central nervous system: Alert. Extremities: No edema. No calf tenderness Skin: No cyanosis. No rashes    Data Reviewed: I have personally reviewed following labs and imaging studies  CBC: Recent Labs  Lab 03/29/18 0647 04/01/18 0605 04/02/18 0533 04/03/18 0519 04/04/18 0456  WBC 9.8 16.3* 23.0* 25.6* 25.8*  NEUTROABS  --   --  19.1*  --   --   HGB 10.4* 11.0*  12.4* 12.6* 11.9*  HCT 32.6* 35.0* 38.4* 40.3 38.0*  MCV 91.8 92.8 91.6 92.2 92.5  PLT 190 245 288 283 725   Basic Metabolic Panel: Recent Labs  Lab 03/29/18 0647 04/01/18 0605 04/02/18 0533  NA 145 143 140  K 3.5 3.2* 4.1  CL 110 103 101  CO2 28 29 27   GLUCOSE 115* 188* 331*  BUN 26* 31* 29*   CREATININE 0.85 0.79 0.94  CALCIUM 8.4* 8.5* 8.7*  MG  --  2.0  --   PHOS  --  3.2  --    GFR: Estimated Creatinine Clearance: 61.5 mL/min (by C-G formula based on SCr of 0.94 mg/dL). Liver Function Tests: No results for input(s): AST, ALT, ALKPHOS, BILITOT, PROT, ALBUMIN in the last 168 hours. No results for input(s): LIPASE, AMYLASE in the last 168 hours. No results for input(s): AMMONIA in the last 168 hours. Coagulation Profile: No results for input(s): INR, PROTIME in the last 168 hours. Cardiac Enzymes: No results for input(s): CKTOTAL, CKMB, CKMBINDEX, TROPONINI in the last 168 hours. BNP (last 3 results) No results for input(s): PROBNP in the last 8760 hours. HbA1C: No results for input(s): HGBA1C in the last 72 hours. CBG: Recent Labs  Lab 04/03/18 1111 04/03/18 1643 04/03/18 2109 04/04/18 0744 04/04/18 1242  GLUCAP 328* 281* 322* 317* 280*   Lipid Profile: No results for input(s): CHOL, HDL, LDLCALC, TRIG, CHOLHDL, LDLDIRECT in the last 72 hours. Thyroid Function Tests: No results for input(s): TSH, T4TOTAL, FREET4, T3FREE, THYROIDAB in the last 72 hours. Anemia Panel: No results for input(s): VITAMINB12, FOLATE, FERRITIN, TIBC, IRON, RETICCTPCT in the last 72 hours. Sepsis Labs: Recent Labs  Lab 04/03/18 1414  PROCALCITON <0.10    Recent Results (from the past 240 hour(s))  Culture, respiratory (non-expectorated)     Status: None   Collection Time: 03/25/18  8:31 PM  Result Value Ref Range Status   Specimen Description TRACHEAL ASPIRATE  Final   Special Requests NONE  Final   Gram Stain   Final    ABUNDANT WBC PRESENT,BOTH PMN AND MONONUCLEAR MODERATE GRAM POSITIVE COCCI MODERATE GRAM NEGATIVE COCCI MODERATE GRAM NEGATIVE RODS    Culture   Final    ABUNDANT Consistent with normal respiratory flora. Performed at [facility], [place] [address], [place], [place] [address]    Report Status 03/28/2018 FINAL  Final         Radiology  Studies: Ct Chest High Resolution  Result Date: 04/03/2018 CLINICAL DATA:  77 year old male with clinical suspicion of interstitial lung disease. Follow-up study. EXAM: CT CHEST WITHOUT CONTRAST TECHNIQUE: Multidetector CT imaging of the chest was performed following the standard protocol without intravenous contrast. High resolution imaging of the lungs, as well as inspiratory and expiratory imaging, was performed. COMPARISON:  Chest CT 12/01/2014. FINDINGS: Cardiovascular: Heart size is normal. There is no significant pericardial fluid, thickening or pericardial calcification. There is aortic atherosclerosis, as well as atherosclerosis of the great vessels of the mediastinum and the coronary arteries, including calcified atherosclerotic plaque in the left main, left anterior descending, left circumflex and right coronary arteries. Calcifications of the aortic valve. Left-sided biventricular pacemaker/AICD with lead tips terminating in the right atrium, near the right ventricular apex and overlying the lateral wall the left ventricle via the coronary sinus and coronary veins. Mediastinum/Nodes: No pathologically enlarged mediastinal or hilar lymph nodes. Please note that accurate exclusion of hilar adenopathy is limited on noncontrast CT scans. Moderate to large hiatal hernia. No axillary  lymphadenopathy. Lungs/Pleura: High-resolution images demonstrate widespread areas of ground-glass attenuation, septal thickening, subpleural reticulation and thickening of the peribronchovascular interstitium. A few scattered areas of mild traction bronchiectasis are noted, most evident in the right middle lobe and upper lobes of the lungs bilaterally. Mild honeycombing also noted in the dependent portions of the lower lobes of the lungs bilaterally. No acute consolidative airspace disease. No pleural effusions. 6 mm left lower lobe nodule (axial image 57 of series 6), stable dating back to 2016, considered definitively  benign. No other definite larger more suspicious appearing pulmonary nodules or masses are noted. Upper Abdomen: Exophytic 5.7 cm low-attenuation lesion in the upper pole the left kidney, not characterized on today's noncontrast CT examination, but statistically likely a cyst. Small calcified gallstones lying dependently in the gallbladder. Aortic atherosclerosis. Musculoskeletal: There are no aggressive appearing lytic or blastic lesions noted in the visualized portions of the skeleton. IMPRESSION: 1. The appearance of the lungs is compatible with interstitial lung disease, with a spectrum of findings considered probable usual interstitial pneumonia (UIP) per current ATS guidelines. Repeat high-resolution chest CT is suggested in 12 months to assess for temporal changes in the appearance of the lung parenchyma. 2. Aortic atherosclerosis, in addition to left main and 3 vessel coronary artery disease. Assessment for potential risk factor modification, dietary therapy or pharmacologic therapy may be warranted, if clinically indicated. 3. There are calcifications of the aortic valve. Echocardiographic correlation for evaluation of potential valvular dysfunction may be warranted if clinically indicated. 4. Moderate to large hiatal hernia. Aortic Atherosclerosis (ICD10-I70.0). Electronically Signed   By: Vinnie Langton M.D.   On: 04/03/2018 08:35        Scheduled Meds: .  stroke: mapping our early stages of recovery book   Does not apply Once  . arformoterol  15 mcg Nebulization BID  . aspirin EC  325 mg Oral Daily   Or  . aspirin  300 mg Rectal Daily  . atorvastatin  20 mg Oral q1800  . budesonide (PULMICORT) nebulizer solution  0.5 mg Nebulization BID  . carvedilol  12.5 mg Oral BID  . chlorhexidine  15 mL Mouth Rinse BID  . enoxaparin (LOVENOX) injection  40 mg Subcutaneous Daily  . furosemide  40 mg Oral Daily  . insulin aspart  0-15 Units Subcutaneous TID WC  . insulin aspart  0-5 Units  Subcutaneous QHS  . insulin glargine  10 Units Subcutaneous Daily  . ipratropium-albuterol  3 mL Nebulization TID  . mouth rinse  15 mL Mouth Rinse q12n4p  . methylPREDNISolone (SOLU-MEDROL) injection  40 mg Intravenous Q6H  . pantoprazole  40 mg Oral QHS  . polyethylene glycol  17 g Oral Daily   Continuous Infusions: . sodium chloride Stopped (03/30/18 1316)  . sodium chloride 10 mL/hr at 03/28/18 1900     LOS: 12 days     [name], MD [facility] 04/04/2018, 1:43 PM  If 7PM-7AM, please contact night-coverage [email or web address]

## 2018-04-04 NOTE — Plan of Care (Signed)
  Problem: Education: Goal: Knowledge of disease or condition will improve Outcome: Progressing Goal: Knowledge of secondary prevention will improve Outcome: Progressing Goal: Knowledge of patient specific risk factors addressed and post discharge goals established will improve Outcome: Progressing Goal: Individualized Educational Video(s) Outcome: Progressing   Problem: Ischemic Stroke/TIA Tissue Perfusion: Goal: Complications of ischemic stroke/TIA will be minimized Outcome: Progressing   Problem: Education: Goal: Knowledge of General Education information will improve Description Including pain rating scale, medication(s)/side effects and non-pharmacologic comfort measures Outcome: Progressing   Problem: Health Behavior/Discharge Planning: Goal: Ability to manage health-related needs will improve Outcome: Progressing   Problem: Clinical Measurements: Goal: Ability to maintain clinical measurements within normal limits will improve Outcome: Progressing Goal: Will remain free from infection Outcome: Progressing Goal: Diagnostic test results will improve Outcome: Progressing Goal: Respiratory complications will improve Outcome: Progressing Goal: Cardiovascular complication will be avoided Outcome: Progressing   Problem: Activity: Goal: Risk for activity intolerance will decrease Outcome: Progressing   Problem: Nutrition: Goal: Adequate nutrition will be maintained Outcome: Progressing   Problem: Elimination: Goal: Will not experience complications related to bowel motility Outcome: Progressing Goal: Will not experience complications related to urinary retention Outcome: Progressing   Problem: Pain Managment: Goal: General experience of comfort will improve Outcome: Progressing   Problem: Safety: Goal: Ability to remain free from injury will improve Outcome: Progressing   Problem: Skin Integrity: Goal: Risk for impaired skin integrity will decrease Outcome:  Progressing   Problem: Activity: Goal: Ability to tolerate increased activity will improve Outcome: Progressing   Problem: Respiratory: Goal: Ability to maintain a clear airway and adequate ventilation will improve Outcome: Progressing   Problem: Role Relationship: Goal: Method of communication will improve Outcome: Progressing

## 2018-04-04 NOTE — Progress Notes (Signed)
NAME:  Terry Peck, MRN:  [id number], DOB:  08/16/1940, LOS: 29 ADMISSION DATE:  03/23/2018, CONSULTATION DATE:  03/23/2018 REFERRING MD:  Dr. 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 S/p tPA and reperfusion. PCCM consulted for ventilation management.  Past Medical History  Pulmonary fibrosis ( did not want aggressive treatment), HTN, DM, chronic diastolic heart failure   Significant Hospital Events   12/1 > Admitted for weakness and aphasia. Found with left ICA and Left M2 occlusion s/p tPA and revascularization 12/2 > Extubated without complication 24/5 > Worsening respiratory status requiring re-intubation. Patient self-extubated and required emergent re-intubation 12/6 Extubated to BiPAP 12/7 Transitioned to humidified high flow 12/8 Tolerating [place] 12/9>> Transferred to 3W rehab 12/10>> Weaning oxygen  12/13>> DNR, comfort care, AIDC has been de-activated  Consults:  Neurology 12/1 PCCM 12/1  Urology 12/3  Procedures:  --ETT 12/1 >> 12/2, 12/3 >> 12/6 --Reintubated 12/3>>Self-extubated on same day>>Reintubated for hypoxemia  Significant Diagnostic Tests:  CT Head 12/1 > 1. Hyperdense terminal left internal carotid artery and left MCA compatible with thrombosis. No evidence of acute infarct or Hemorrhage Atrophy and chronic microvascular ischemic changes in the white matter.  CTA Head/Neck 12/1 > 1. 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 Reconstitution of the supraclinoid left internal carotid artery through the anterior communicating artery. There is clot in the left M1 and M2 segments with decreased blood flow. There is occlusion of the posterior division of the left middle cerebral artery due to Clot. 50% diameter stenosis  proximal right internal carotid artery. Moderate stenosis origin of left vertebral artery Probable fetal origin left posterior cerebral artery which is supplied from a small hypoplastic left P1 segment.  High-resolution CT chest 12/11- widespread areas of groundglass attenuation of septal thickening reticulation, traction bronchiectasis with mild honeycombing.  Probable UIP pattern with progression compared to 2016.  I have reviewed the images personally.  Micro Data:  Tracheal Asp 12/1, 12/3 >> Normal Resp flora  Antimicrobials:  Levofloxacin 12/3 >>12/7  Interim history/subjective:  Continues on high flow nasal cannula. Plan for no increase in oxygen, but addition of comfort medication for dyspnea.  Objective   Blood pressure (!) 159/91, pulse 73, temperature 98.2 F (36.8 C), temperature source Axillary, resp. rate 20, height 5\' 7"  (1.702 m), weight 77.3 kg, SpO2 98 %.    FiO2 (%):  [95 %-97 %] 97 %   Intake/Output Summary (Last 24 hours) at 04/04/2018 1453 Last data filed at 04/04/2018 0000 Gross per 24 hour  Intake 960 ml  Output 300 ml  Net 660 ml   Filed Weights   03/28/18 0500 04/01/18 0500 04/02/18 0443  Weight: 84.2 kg 75.9 kg 77.3 kg    Examination: Gen:      No acute distress, awake and alert on HFNC 12 L HEENT:  EOMI, sclera anicteric, NCAT Neck:     No masses; no thyromegaly, no LAD Lungs:   Bilateral chest excursion, Basal crackles throughout. CV:         S1, S2, Regular rate and rhythm; no MRG Abd:      + bowel sounds; soft, non-tender; no palpable masses, no distension Ext:    No edema; adequate peripheral perfusion Skin:      Warm and dry;  no rash Neuro: Awake, Confused, in NAD, family with patient  Resolved Hospital Problem list   Hyperkalemia Hematuria secondary to traumatic Foley in setting of TPA Hypotension secondary to sedation  Assessment & Plan:  Acute on chronic hypoxemic respiratory failure Pulmonary Fibrosis Exacerbation Hx of Aspiration    Dr. Lenna Gilford is his pulmonologist. On 2 L via [place] at home.  Recent TTE suggestive of PH. 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 Patient had previously declined treatment for pulmonary fibrosis. Plan:  His high-res CT shows progression of lung fibrosis with groundglass opacities Acute exacerbation of IPF Continue  Steroids  Palliative care consulted, and comfort care measures have been instituted DNR established 04/04/2018 Continue meds that provide comfort ( Morphine, ativan, haldol, robinul) No further increase in oxygen, use medications for dyspnea instead [employer] 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 Appreciate assist. Transfer to Hospice per primary team  Magdalen Spatz, AGACNP-BC [facility] 04/04/2018, 2:53 PM

## 2018-04-04 NOTE — Progress Notes (Signed)
SLP Cancellation Note  Patient Details Name: Terry Peck MRN: [id number] DOB: 01/30/1941   Cancelled treatment:       Reason Eval/Treat Not Completed: Other (comment). Orders discontinued given plan of care, sign off.    Sukhraj Esquivias, Katherene Ponto 04/04/2018, 2:20 PM

## 2018-04-04 NOTE — Progress Notes (Signed)
Palliative Medicine RN Note: Left message for Angela Cox [employer] to have defibrillator deactivated at 1258.  Aaron Edelman w [employer] called back; AICD was deactivated on Tuesday 12/10.  Marjie Skiff Nikhita Mentzel, RN, BSN, [facility] [place] Palliative Medicine Team 04/04/2018 1:04 PM Office [phone]

## 2018-04-04 NOTE — Consult Note (Signed)
Consultation Note Date: 04/04/2018   Patient Name: Terry Peck  DOB: [birth year]  MRN: [id number]  Age / Sex: 77 y.o., male  PCP: Biagio Borg, MD Referring Physician: Mariel Aloe, MD  Reason for Consultation: Establishing goals of care, Hospice Evaluation, Inpatient hospice referral, Non pain symptom management, Psychosocial/spiritual support and Terminal Care  HPI/Patient Profile: 77 y.o. male  with past medical history of chronic systolic heart failure s/p AICD, smoking, GERD, HTN, HLD, pulmonary fibrosis on 2L oxygen at home, and T2DM admitted on 03/23/2018 with R sided weakness and aphasia. He was found to have left MCA infarct.  Patient underwent tPA and angiogram with complete revascularization. He required intubation 12/1, was extubated 12/2, reintubated then self-extubated, emergently reintubated and finally extubated and made DNI on 12/6. The plan was for patient to go to CIR; however, patient continues to require HFNC. His high-res CT revealed progression of fibrosis with ground glass opacities. Per pulmonology, overall prognosis is poor. PMT consulted by pulmonology d/t poor prognosis to discuss end of life care.    Clinical Assessment and Goals of Care: I have reviewed medical records including EPIC notes, labs and imaging, received report from Dr. Lonny Prude and RN, assessed the patient and then met with patient's son and daughter  to discuss diagnosis prognosis, GOC, EOL wishes, disposition and options.  According to son and daughter, patient's wife has dementia and the patient has served as her primary caregiver for years. She is moving to ALF soon; they were planning for patient to go with her. They are feeling overwhelmed with the transitions they are facing.   I introduced Palliative Medicine as specialized medical care for people living with serious illness. It focuses on providing relief from the symptoms and stress of a serious  illness. The goal is to improve quality of life for both the patient and the family.  We discussed a brief life review of the patient. 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 They describe him as kind and "a jokester".   As far as functional and nutritional status, they tell me prior to this hospitalization he was doing "okay". His function was limited d/t pulmonary status. He would have to sit after walking across a room d/t shortness of breath. He was on continuous oxygen. He could complete ADLs independently. No issues with nutrition or cognition.    We discussed his current illness and what it means in the larger context of his on-going co-morbidities.  Natural disease trajectory and expectations at EOL were discussed. Discussed AMS since stroke and respiratory failure. Discussed high oxygen requirements. They understand he is nearing end of life.   I attempted to elicit values and goals of care important to the patient.  They tell me he would not want life prolonged artificially. They tell me about his faith and that he does not fear death.   The difference between aggressive medical intervention and comfort care was considered in light of the patient's goals of care. Family agrees that we should focus on his comfort and not pursue aggressive medical care. They tell me their main goal is his comfort.   Advance directives, concepts specific to code status, artifical feeding and hydration, and rehospitalization were considered and discussed. Family agrees to DNR status.   Hospice and Palliative Care services outpatient were explained and offered. Family is interested in hospice home placement.  Questions and concerns were addressed. The family was encouraged to call  with questions or concerns.   Primary Decision Maker NEXT OF KIN - son and daughter joint decision makers - spouse has dementia    SUMMARY OF RECOMMENDATIONS   - comfort care, will  continue steroids for now - discontinue all other medications not needed for comfort - add PRN meds for comfort - prn morphine, ativan, haldol, and robinul - currently patient appears comfortable but may require comfort medications as nears end of life - do not increase oxygen - use medications for dyspnea - transfer to hospice home - code status changed to DNR - per [employer] Jude, AICD has been deactivated- continue pacer  Code Status/Advance Care Planning:  DNR  Palliative Prophylaxis:   Aspiration, Bowel Regimen, Delirium Protocol, Frequent Pain Assessment, Oral Care and Turn Reposition  Additional Recommendations (Limitations, Scope, Preferences):  Full Comfort Care  Psycho-social/Spiritual:   Desire for further Chaplaincy support:no  Additional Recommendations: Education on Hospice  Prognosis:   < 2 weeks  Discharge Planning: Hospice facility      Primary Diagnoses: Present on Admission: . Stroke ([place]) . Middle cerebral artery embolism, left   I have reviewed the medical record, interviewed the patient and family, and examined the patient. The following aspects are pertinent.  Past Medical History:  Diagnosis Date  . Abnormal ANCA test 07/03/2011  . Allergy   . Arthritis    "mild; right shoulder" (01/29/2012)  . B12 deficiency anemia   . Barrett's esophagus 11/23/2010  . Bundle branch block, left   . Cataract    L eye surgery  . CHF (congestive heart failure) ([place])    pacemaker  . Chronic systolic dysfunction of left ventricle    EF 30%  . Colon polyps 11/23/2010  . GERD (gastroesophageal reflux disease)   . Hyperlipidemia 10/14/2012  . Hypertension   . ICD (implantable cardiac defibrillator) in place   . Iron deficiency anemia 05/30/2011  . Liver abscess 1980's  . Nonischemic cardiomyopathy ([place])    s/p St. Jude BiV ICD 01/29/12  . Pulmonary fibrosis ([place])   . Pulmonary nodules   . Shortness of breath    "related to heart being out of rhythm" (01/29/2012)  .  Type II diabetes mellitus ([place])    Social History   Socioeconomic History  . Marital status: Married    Spouse name: Not on file  . Number of children: 2  . Years of education: 24  . Highest education level: Not on file  Occupational History  . Occupation: Music therapist    Comment: BS Mathematics--Retired   Social Needs  . Financial resource strain: Not on file  . Food insecurity:    Worry: Not on file    Inability: Not on file  . Transportation needs:    Medical: Not on file    Non-medical: Not on file  Tobacco Use  . Smoking status: Former Smoker    Packs/day: 0.50    Years: 10.00    Pack years: 5.00    Types: Cigarettes    Last attempt to quit: 04/23/1988    Years since quitting: 29.9  . Smokeless tobacco: Never Used  Substance and Sexual Activity  . Alcohol use: No    Alcohol/week: 0.0 standard drinks  . Drug use: No  . Sexual activity: Not Currently  Lifestyle  . Physical activity:    Days per week: Not on file    Minutes per session: Not on file  . Stress: Not on file  Relationships  . Social connections:  Talks on phone: Not on file    Gets together: Not on file    Attends religious service: Not on file    Active member of club or organization: Not on file    Attends meetings of clubs or organizations: Not on file    Relationship status: Not on file  Other Topics Concern  . Not on file  Social History Narrative   Lives in [place] with spouse.  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     Family History  Problem Relation Age of Onset  . Heart disease Mother   . Colon cancer Neg Hx   . Esophageal cancer Neg Hx   . Stomach cancer Neg Hx    Scheduled Meds: .  stroke: mapping our early stages of recovery book   Does not apply Once  . arformoterol  15 mcg Nebulization BID  . aspirin EC  325 mg Oral Daily   Or  . aspirin  300 mg Rectal Daily  . atorvastatin  20 mg Oral q1800  . budesonide  (PULMICORT) nebulizer solution  0.5 mg Nebulization BID  . carvedilol  12.5 mg Oral BID  . chlorhexidine  15 mL Mouth Rinse BID  . enoxaparin (LOVENOX) injection  40 mg Subcutaneous Daily  . furosemide  40 mg Oral Daily  . insulin aspart  0-15 Units Subcutaneous TID WC  . insulin aspart  0-5 Units Subcutaneous QHS  . insulin glargine  10 Units Subcutaneous Daily  . ipratropium-albuterol  3 mL Nebulization TID  . mouth rinse  15 mL Mouth Rinse q12n4p  . methylPREDNISolone (SOLU-MEDROL) injection  40 mg Intravenous Q6H  . pantoprazole  40 mg Oral QHS  . polyethylene glycol  17 g Oral Daily   Continuous Infusions: . sodium chloride Stopped (03/30/18 1316)  . sodium chloride 10 mL/hr at 03/28/18 1900   PRN Meds:.sodium chloride, acetaminophen **OR** acetaminophen (TYLENOL) oral liquid 160 mg/5 mL **OR** acetaminophen, albuterol, RESOURCE THICKENUP CLEAR, senna-docusate Allergies  Allergen Reactions  . Penicillins Anaphylaxis and Other (See Comments)    "swelling of eyes; throat; could breath good" (01/29/2012) Has patient had a PCN reaction causing immediate rash, facial/tongue/throat swelling, SOB or lightheadedness with hypotension: Yes Has patient had a PCN reaction causing severe rash involving mucus membranes or skin necrosis: Unknown Has patient had a PCN reaction that required hospitalization: Unknown Has patient had a PCN reaction occurring within the last 10 years: No If all of the above answers are "NO", then may proceed with Cephalospor  . Iodine Other (See Comments)    "if I get iodine dye, I'll get a fever" (01/29/2012)  . Fish Oil Itching    Pt said only when given through IV   Review of Systems  Unable to perform ROS: Intubated    Physical Exam Constitutional:      General: He is not in acute distress.    Appearance: He is not diaphoretic.  HENT:     Head: Normocephalic and atraumatic.  Cardiovascular:     Rate and Rhythm: Regular rhythm.     Comments: Paced  rhythm on monitor Pulmonary:     Effort: Tachypnea and accessory muscle usage present.     Breath sounds: Normal breath sounds.  Abdominal:     Palpations: Abdomen is soft.  Musculoskeletal:     Right lower leg: No edema.     Left lower leg: No edema.  Skin:    General: Skin is warm and dry.  Neurological:  Mental Status: He is alert. He is disoriented.     Deep Tendon Reflexes: Left Babinski's sign:    Psychiatric:        Cognition and Memory: Cognition is impaired.     Vital Signs: BP (!) 159/91 (BP Location: Left Arm)   Pulse 73   Temp 98.2 F (36.8 C) (Axillary)   Resp 20   Ht '5\' 7"'  (1.702 m)   Wt 77.3 kg   SpO2 98%   BMI 26.69 kg/m  Pain Scale: 0-10 POSS *See Group Information*: 1-Acceptable,Awake and alert Pain Score: 0-No pain   SpO2: SpO2: 98 % O2 Device:SpO2: 98 % O2 Flow Rate: .O2 Flow Rate (L/min): 12 L/min  IO: Intake/output summary:   Intake/Output Summary (Last 24 hours) at 04/04/2018 1041 Last data filed at 04/04/2018 0000 Gross per 24 hour  Intake 1200 ml  Output 300 ml  Net 900 ml    LBM: Last BM Date: 04/02/18 Baseline Weight: Weight: 75 kg Most recent weight: Weight: 77.3 kg     Palliative Assessment/Data: PPS 20%    Time Total: 90 minutes Greater than 50%  of this time was spent counseling and coordinating care related to the above assessment and plan.  Juel Burrow, DNP, AGNP-C Palliative Medicine Team [phone] Pager: [phone]

## 2018-04-04 NOTE — Progress Notes (Signed)
Inpatient Rehabilitation Admissions Coordinator  Noted plans for hospice/comfort care. I will sign off  Danne Baxter, RN, MSN Rehab Admissions Coordinator [phone] 04/04/2018 4:11 PM

## 2018-04-04 NOTE — Progress Notes (Signed)
RT made aware pt was desatting, went in to check on pt, pt's NRB mask was hooked up to the high flow [place] ([place]) @ 15L. RT attempted to place pt back on back on high flow but pt began to drop his sats very quickly. Pt on NRB at this time, RN aware. RT explained to RN how to hook up NRB mask.

## 2018-04-05 MED ORDER — METHYLPREDNISOLONE SODIUM SUCC 40 MG IJ SOLR
40.0000 mg | Freq: Every day | INTRAMUSCULAR | Status: DC
  Administered 2018-04-06 – 2018-04-07 (×2): 40 mg via INTRAVENOUS
  Filled 2018-04-05 (×2): qty 1

## 2018-04-05 MED ORDER — MORPHINE SULFATE (PF) 2 MG/ML IV SOLN
1.0000 mg | INTRAVENOUS | Status: DC | PRN
  Administered 2018-04-06: 2 mg via INTRAVENOUS
  Filled 2018-04-05: qty 1

## 2018-04-05 NOTE — Progress Notes (Signed)
PROGRESS NOTE    Terry Peck  HRC:[id number] DOB: 11/14/1940 DOA: 03/23/2018 PCP: Terry Borg, MD   Brief Narrative: Terry Peck is a 77 y.o. male with a history of smoking, pulmonary fibrosis, chronic systolic heart failure s/p AICD, diabetes mellitus type 2. Patient presented as a code stroke and found to have a left M2 occlusion with evidence of left ICA stroke. Patient underwent tPA, angiogram with complete revascularization and required intubation. He was extubated and reintubated 2 times before final extubation on 12/6. Plan for discharge to CIR when medically ready.   Assessment & Plan:   Principal Problem:   Stroke [facility]) Active Problems:   Middle cerebral artery embolism, left   Acute embolic stroke (HCC)   Hypoxia   Pulmonary fibrosis (HCC)   Dysphasia, post-stroke   Dysphagia, post-stroke   Diabetes mellitus type 2 in nonobese [facility])   Essential hypertension   Hypokalemia   Acute respiratory failure (HCC)   Goals of care, counseling/discussion   Palliative care by specialist   Comfort measures only status   Acute ischemic stroke Left MCA 2/2 left ICA/M2 occlusion. S/p tPA and angiogram with complete revascularization. Patient is comfort care.  Acute on chronic respiratory failure with hypoxia Pulmonary fibrosis Per report, patient is on 2L at home via nasal canula. Required intubation, now extubated. Worsened requirement up to 15 L via HFNC currently. Pulmonology on board now signed ff. Completed course of Levaquin and Solo-medrol but Solu-medrol restarted. Mild improvement in oxygen needs. Plan is for full comfort care with discharge to residential hospice. Pulmonology recommending taper of prednisone on discharge, 60 mg, down 10 mg weekly if able to take by mouth.  Somnolence Likely secondary to respiratory failure. Wax and waning.  Essential hypertension Coreg discontinued.  Diabetes mellitus, type 2 SSI discontinued.  Chronic systolic and  diastolic heart failure S/p AICD. Ejection fraction improved. Currently stable. St. Jude called for deactivation. AICD deactivated 12/10  Leukocytosis In setting of steroids. No fevers. Obtained procalcitonin which was undetectable. Patient is comfort care currently   DVT prophylaxis: Lovenox Code Status:   Code Status: DNR Family Communication: None at bedside Disposition Plan: Discharge to hospice house  Consultants:   Neurology  Interventional radiology  Pulmonology/critical care medicine  Procedures:   Complete revascularization  Intubation  Antimicrobials:  Levaquin (12/3>>12/7)   Subjective: Difficulty expressing his concerns to me today.  Objective: Vitals:   04/04/18 0903 04/04/18 0904 04/05/18 0401 04/05/18 0930  BP:   113/61 111/61  Pulse:  73 83 87  Resp:  20 19 20   Temp:   98.2 F (36.8 C) 98.2 F (36.8 C)  TempSrc:   Oral Oral  SpO2: 98% 98% (!) 85% 91%  Weight:      Height:        Intake/Output Summary (Last 24 hours) at 04/05/2018 1249 Last data filed at 04/05/2018 1245 Gross per 24 hour  Intake 60 ml  Output 850 ml  Net -790 ml   Filed Weights   03/28/18 0500 04/01/18 0500 04/02/18 0443  Weight: 84.2 kg 75.9 kg 77.3 kg    Examination:  General: appears comfortable. HEENT: dry mucous membranes    Data Reviewed: I have personally reviewed following labs and imaging studies  CBC: Recent Labs  Lab 04/01/18 0605 04/02/18 0533 04/03/18 0519 04/04/18 0456  WBC 16.3* 23.0* 25.6* 25.8*  NEUTROABS  --  19.1*  --   --   HGB 11.0* 12.4* 12.6* 11.9*  HCT 35.0* 38.4* 40.3 38.0*  MCV 92.8 91.6 92.2 92.5  PLT 245 288 283 103   Basic Metabolic Panel: Recent Labs  Lab 04/01/18 0605 04/02/18 0533  NA 143 140  K 3.2* 4.1  CL 103 101  CO2 29 27  GLUCOSE 188* 331*  BUN 31* 29*  CREATININE 0.79 0.94  CALCIUM 8.5* 8.7*  MG 2.0  --   PHOS 3.2  --    GFR: Estimated Creatinine Clearance: 61.5 mL/min (by C-G formula based on SCr  of 0.94 mg/dL). Liver Function Tests: No results for input(s): AST, ALT, ALKPHOS, BILITOT, PROT, ALBUMIN in the last 168 hours. No results for input(s): LIPASE, AMYLASE in the last 168 hours. No results for input(s): AMMONIA in the last 168 hours. Coagulation Profile: No results for input(s): INR, PROTIME in the last 168 hours. Cardiac Enzymes: No results for input(s): CKTOTAL, CKMB, CKMBINDEX, TROPONINI in the last 168 hours. BNP (last 3 results) No results for input(s): PROBNP in the last 8760 hours. HbA1C: No results for input(s): HGBA1C in the last 72 hours. CBG: Recent Labs  Lab 04/03/18 1111 04/03/18 1643 04/03/18 2109 04/04/18 0744 04/04/18 1242  GLUCAP 328* 281* 322* 317* 280*   Lipid Profile: No results for input(s): CHOL, HDL, LDLCALC, TRIG, CHOLHDL, LDLDIRECT in the last 72 hours. Thyroid Function Tests: No results for input(s): TSH, T4TOTAL, FREET4, T3FREE, THYROIDAB in the last 72 hours. Anemia Panel: No results for input(s): VITAMINB12, FOLATE, FERRITIN, TIBC, IRON, RETICCTPCT in the last 72 hours. Sepsis Labs: Recent Labs  Lab 04/03/18 1414  PROCALCITON <0.10    No results found for this or any previous visit (from the past 240 hour(s)).       Radiology Studies: No results found.      Scheduled Meds: .  stroke: mapping our early stages of recovery book   Does not apply Once  . chlorhexidine  15 mL Mouth Rinse BID  . mouth rinse  15 mL Mouth Rinse q12n4p  . methylPREDNISolone (SOLU-MEDROL) injection  40 mg Intravenous Q6H  . polyethylene glycol  17 g Oral Daily   Continuous Infusions:    LOS: 13 days     Terry Poche, MD [facility] 04/05/2018, 12:49 PM  If 7PM-7AM, please contact night-coverage [email or web address]

## 2018-04-05 NOTE — Progress Notes (Signed)
Patient has been comfortable this shift. No complaints of pain, denies feeling short of breath. Will continue to monitor.

## 2018-04-05 NOTE — Progress Notes (Addendum)
Reviewed Chart.  Examined patient.  PE: Awake, Alert HOH. Gives me a thumbs up. CV regular with systolic murmur 3/6 Abdomen soft, nt, nd Resp high flow Oxygen in place.  No distress.  SOB when speaking.  Patient denies discomfort.  Smiles at me.  Refuses to eat or drink anything.  He appears to be resting comfortably.  Plan for hospice house when bed available.  Florentina Jenny, PA-C Palliative Medicine Pager: [phone]  Time 15 min.

## 2018-04-06 DIAGNOSIS — R0600 Dyspnea, unspecified: Secondary | ICD-10-CM

## 2018-04-06 NOTE — Progress Notes (Signed)
PROGRESS NOTE    Terry Peck  FIE:[id number] DOB: [birth year] DOA: 03/23/2018 PCP: Biagio Borg, MD   Brief Narrative: Terry Peck is a 77 y.o. male with a history of smoking, pulmonary fibrosis, chronic systolic heart failure s/p AICD, diabetes mellitus type 2. Patient presented as a code stroke and found to have a left M2 occlusion with evidence of left ICA stroke. Patient underwent tPA, angiogram with complete revascularization and required intubation. He was extubated and reintubated 2 times before final extubation on 12/6. Plan for discharge to hospice. Comfort care.   Assessment & Plan:   Principal Problem:   Stroke [facility]) Active Problems:   Middle cerebral artery embolism, left   Acute embolic stroke (HCC)   Hypoxia   Pulmonary fibrosis (HCC)   Dysphasia, post-stroke   Dysphagia, post-stroke   Diabetes mellitus type 2 in nonobese [facility])   Essential hypertension   Hypokalemia   Acute respiratory failure (HCC)   Goals of care, counseling/discussion   Palliative care by specialist   Comfort measures only status   Acute ischemic stroke Left MCA 2/2 left ICA/M2 occlusion. S/p tPA and angiogram with complete revascularization. Patient is comfort care.  Acute on chronic respiratory failure with hypoxia Pulmonary fibrosis Per report, patient is on 2L at home via nasal canula. Required intubation, now extubated. Worsened requirement up to 15 L via HFNC currently. Pulmonology on board now signed ff. Completed course of Levaquin and Solo-medrol but Solu-medrol restarted. Mild improvement in oxygen needs. Plan is for full comfort care with discharge to residential hospice. Pulmonology recommending taper of prednisone on discharge, 60 mg, down 10 mg weekly if able to take by mouth.  Somnolence Likely secondary to respiratory failure. Wax and waning.  Essential hypertension Coreg discontinued.  Diabetes mellitus, type 2 SSI discontinued.  Chronic systolic and diastolic  heart failure S/p AICD. Ejection fraction improved. Currently stable. St. Jude called for deactivation. AICD deactivated 12/10  Leukocytosis In setting of steroids. No fevers. Obtained procalcitonin which was undetectable. Patient is comfort care currently   DVT prophylaxis: Lovenox Code Status:   Code Status: DNR Family Communication: None at bedside Disposition Plan: Discharge to hospice house  Consultants:   Neurology  Interventional radiology  Pulmonology/critical care medicine  Procedures:   Complete revascularization  Intubation  Antimicrobials:  Levaquin (12/3>>12/7)   Subjective: No issues reported overnight. Patient sleeping.  Objective: Vitals:   04/04/18 0904 04/05/18 0401 04/05/18 0930 04/05/18 2018  BP:  113/61 111/61 132/76  Pulse: 73 83 87 82  Resp: 20 19 20 20   Temp:  98.2 F (36.8 C) 98.2 F (36.8 C) (!) 97.4 F (36.3 C)  TempSrc:  Oral Oral Oral  SpO2: 98% (!) 85% 91% 94%  Weight:      Height:        Intake/Output Summary (Last 24 hours) at 04/06/2018 0834 Last data filed at 04/06/2018 0457 Gross per 24 hour  Intake 60 ml  Output 500 ml  Net -440 ml   Filed Weights   03/28/18 0500 04/01/18 0500 04/02/18 0443  Weight: 84.2 kg 75.9 kg 77.3 kg    Examination:  General: Well appearing, no distress Respiratory: Tachypnea with mild accessory muscle usage    Data Reviewed: I have personally reviewed following labs and imaging studies  CBC: Recent Labs  Lab 04/01/18 0605 04/02/18 0533 04/03/18 0519 04/04/18 0456  WBC 16.3* 23.0* 25.6* 25.8*  NEUTROABS  --  19.1*  --   --   HGB 11.0* 12.4* 12.6*  11.9*  HCT 35.0* 38.4* 40.3 38.0*  MCV 92.8 91.6 92.2 92.5  PLT 245 288 283 176   Basic Metabolic Panel: Recent Labs  Lab 04/01/18 0605 04/02/18 0533  NA 143 140  K 3.2* 4.1  CL 103 101  CO2 29 27  GLUCOSE 188* 331*  BUN 31* 29*  CREATININE 0.79 0.94  CALCIUM 8.5* 8.7*  MG 2.0  --   PHOS 3.2  --    GFR: Estimated  Creatinine Clearance: 61.5 mL/min (by C-G formula based on SCr of 0.94 mg/dL). Liver Function Tests: No results for input(s): AST, ALT, ALKPHOS, BILITOT, PROT, ALBUMIN in the last 168 hours. No results for input(s): LIPASE, AMYLASE in the last 168 hours. No results for input(s): AMMONIA in the last 168 hours. Coagulation Profile: No results for input(s): INR, PROTIME in the last 168 hours. Cardiac Enzymes: No results for input(s): CKTOTAL, CKMB, CKMBINDEX, TROPONINI in the last 168 hours. BNP (last 3 results) No results for input(s): PROBNP in the last 8760 hours. HbA1C: No results for input(s): HGBA1C in the last 72 hours. CBG: Recent Labs  Lab 04/03/18 1111 04/03/18 1643 04/03/18 2109 04/04/18 0744 04/04/18 1242  GLUCAP 328* 281* 322* 317* 280*   Lipid Profile: No results for input(s): CHOL, HDL, LDLCALC, TRIG, CHOLHDL, LDLDIRECT in the last 72 hours. Thyroid Function Tests: No results for input(s): TSH, T4TOTAL, FREET4, T3FREE, THYROIDAB in the last 72 hours. Anemia Panel: No results for input(s): VITAMINB12, FOLATE, FERRITIN, TIBC, IRON, RETICCTPCT in the last 72 hours. Sepsis Labs: Recent Labs  Lab 04/03/18 1414  PROCALCITON <0.10    No results found for this or any previous visit (from the past 240 hour(s)).       Radiology Studies: No results found.      Scheduled Meds: .  stroke: mapping our early stages of recovery book   Does not apply Once  . chlorhexidine  15 mL Mouth Rinse BID  . mouth rinse  15 mL Mouth Rinse q12n4p  . methylPREDNISolone (SOLU-MEDROL) injection  40 mg Intravenous Daily  . polyethylene glycol  17 g Oral Daily   Continuous Infusions:    LOS: 14 days     [name], MD [facility] 04/06/2018, 8:34 AM  If 7PM-7AM, please contact night-coverage [email or web address]

## 2018-04-06 NOTE — Progress Notes (Signed)
Daily Progress Note   Patient Name: Terry Peck       Date: 04/06/2018 DOB: Sep 07, 1940  Age: 77 y.o. MRN#: [id number] Attending Physician: Mariel Aloe, MD Primary Care Physician: Biagio Borg, MD Admit Date: 03/23/2018  Reason for Consultation/Follow-up: Establishing goals of care and Terminal Care  Subjective:  Patient wakes to voice. He is hard of hearing. He remembers he is in the hospital. He tells me he is short of breath and looks mildly uncomfortable with dyspnea and increased work of breathing. RN instructed to give prn SL morphine. Patient denies food or drink offered.   No family at bedside.   Length of Stay: 14  Current Medications: Scheduled Meds:  .  stroke: mapping our early stages of recovery book   Does not apply Once  . chlorhexidine  15 mL Mouth Rinse BID  . mouth rinse  15 mL Mouth Rinse q12n4p  . methylPREDNISolone (SOLU-MEDROL) injection  40 mg Intravenous Daily  . polyethylene glycol  17 g Oral Daily    Continuous Infusions:   PRN Meds: acetaminophen **OR** acetaminophen (TYLENOL) oral liquid 160 mg/5 mL **OR** acetaminophen, glycopyrrolate **OR** glycopyrrolate **OR** glycopyrrolate, haloperidol **OR** haloperidol **OR** haloperidol lactate, LORazepam **OR** [DISCONTINUED] LORazepam **OR** LORazepam, morphine injection, morphine CONCENTRATE **OR** morphine CONCENTRATE, RESOURCE THICKENUP CLEAR, senna-docusate  Physical Exam Vitals signs and nursing note reviewed.  Constitutional:      Appearance: He is cachectic. He is ill-appearing.  HENT:     Head: Normocephalic and atraumatic.  Cardiovascular:     Rate and Rhythm: Normal rate.     Heart sounds: Murmur present.  Pulmonary:     Effort: No tachypnea, accessory muscle usage or respiratory  distress.     Breath sounds: Decreased breath sounds present.     Comments: Mild dyspnea and increased WOB at rest. RN to give prn morphine. Skin:    General: Skin is warm and dry.     Coloration: Skin is mottled.     Findings: Ecchymosis present.  Neurological:     Mental Status: He is easily aroused.     Comments: Drowsy, wakes to voice. Pleasantly confused  Psychiatric:        Attention and Perception: He is inattentive.        Speech: Speech is delayed.  Vital Signs: BP 133/70 (BP Location: Left Arm)   Pulse 86   Temp 98 F (36.7 C) (Axillary)   Resp 20   Ht 5\' 7"  (1.702 m)   Wt 77.3 kg   SpO2 92%   BMI 26.69 kg/m  SpO2: SpO2: 92 % O2 Device: O2 Device: Nasal Cannula O2 Flow Rate: O2 Flow Rate (L/min): 10 L/min  Intake/output summary:   Intake/Output Summary (Last 24 hours) at 04/06/2018 5409 Last data filed at 04/06/2018 0848 Gross per 24 hour  Intake 180 ml  Output 100 ml  Net 80 ml   LBM: Last BM Date: (PTA) Baseline Weight: Weight: 75 kg Most recent weight: Weight: 77.3 kg       Palliative Assessment/Data: PPS 20%   Flowsheet Rows     Most Recent Value  Intake Tab  Referral Department  Critical care  Unit at Time of Referral  Intermediate Care Unit  Palliative Care Primary Diagnosis  Pulmonary  Date Notified  04/03/18  Palliative Care Type  New Palliative care  Reason for referral  Clarify Goals of Care, End of Life Care Assistance  Date of Admission  03/23/18  Date first seen by Palliative Care  04/04/18  # of days Palliative referral response time  1 Day(s)  # of days IP prior to Palliative referral  11  Clinical Assessment  Palliative Performance Scale Score  20%  Psychosocial & Spiritual Assessment  Palliative Care Outcomes  Patient/Family meeting held?  Yes  Who was at the meeting?  son and daughter  Palliative Care Outcomes  Improved pain interventions, Improved non-pain symptom therapy, Clarified goals of care, Counseled  regarding hospice, Provided end of life care assistance, Provided psychosocial or spiritual support, Changed to focus on comfort, Changed CPR status, Transitioned to hospice      Patient Active Problem List   Diagnosis Date Noted  . Goals of care, counseling/discussion   . Palliative care by specialist   . Comfort measures only status   . Acute embolic stroke ([place])   . Hypoxia   . Pulmonary fibrosis ([place])   . Dysphasia, post-stroke   . Dysphagia, post-stroke   . Diabetes mellitus type 2 in nonobese (HCC)   . Essential hypertension   . Hypokalemia   . Acute respiratory failure ([place])   . Stroke ([place]) 03/23/2018  . 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 Type 2 diabetes mellitus with complication, with long-term current use of insulin ([place]) 02/18/2017  . Restrictive lung disease 08/16/2016  . Nocturnal hypoxemia 08/16/2016  . Exercise hypoxemia 08/16/2016  . History of iron deficiency anemia 08/16/2016  . Symptomatic anemia 11/17/2014  . Solitary pulmonary nodule 03/12/2014  . Hyperlipidemia 10/14/2012  . Automatic implantable cardioverter-defibrillator in situ 01/31/2012  . Overweight (BMI 25.0-29.9) 09/10/2011  . Chronic systolic heart failure ([place]) 06/28/2011  . Left bundle branch block 06/28/2011  . IPF (idiopathic pulmonary fibrosis) ([place]) 06/25/2011  . Abnormal echocardiogram 06/20/2011  . Iron deficiency anemia 05/30/2011  . B12 deficiency 05/30/2011  . DOE (dyspnea on exertion) 05/29/2011  . Shoulder pain, right 05/29/2011  . Abnormal ECG 05/29/2011  . Preventative health care 11/23/2010  . Colon polyps 11/23/2010  . Barrett's esophagus 11/23/2010  . Allergy   . Arthritis   . Diabetes ([place])   . GERD (gastroesophageal reflux disease)   . Hypertensive cardiovascular disease     Palliative Care Assessment & Plan   Patient Profile: 77 y.o. male  with past medical history ofchronic systolic heart failure s/p  AICD, smoking, GERD, HTN, HLD, pulmonary  fibrosis on 2L oxygen at home, and T2DM admitted on 03/23/2018 with R sided weakness and aphasia. He was found to have left MCA infarct.  Patient underwent tPA and angiogram with complete revascularization. He required intubation 12/1, was extubated 12/2, reintubated then self-extubated, emergently reintubated and finally extubated and made DNI on 12/6. The plan was for patient to go to CIR; however, patient continues to require HFNC. His high-res CT revealed progression of fibrosis with ground glass opacities. Per pulmonology, overall prognosis is poor. PMT consulted by pulmonology d/t poor prognosis to discuss end of life care.    Assessment: Acute on chronic respiratory failure with hypoxia Pulmonary fibrosis Acute ischemic stroke Chronic combined CHF Leukocytosis Somnolence Dyspnea  Recommendations/Plan:  Comfort measures only. Pending inpatient hospice bed.   Continue current regimen for symptom management. May need to schedule low-dose morphine to maintain comfort from dyspnea.   Comfort feeds per patient/family request.  Goals of Care and Additional Recommendations:  Limitations on Scope of Treatment: Full Comfort Care  Code Status: DNR/DNI   Code Status Orders  (From admission, onward)         Start     Ordered   04/04/18 1357  Do not attempt resuscitation (DNR)  Continuous    Question Answer Comment  In the event of cardiac or respiratory ARREST Do not call a "code blue"   In the event of cardiac or respiratory ARREST Do not perform Intubation, CPR, defibrillation or ACLS   In the event of cardiac or respiratory ARREST Use medication by any route, position, wound care, and other measures to relive pain and suffering. May use oxygen, suction and manual treatment of airway obstruction as needed for comfort.      04/04/18 1358        Code Status History    Date Active Date Inactive Code Status Order ID Comments User Context   04/04/2018 1254 04/04/2018 1358 DNR  [id number]  Philis Pique, NP Inpatient   04/04/2018 1238 04/04/2018 1254 DNR [id number]  Philis Pique, NP Inpatient   03/28/2018 1201 04/04/2018 1238 Partial Code [id number]  Corinda Gubler, RN Inpatient   03/23/2018 2136 03/28/2018 1201 Full Code [id number]  Luanne Bras, MD Inpatient   03/23/2018 2126 03/23/2018 2136 Full Code [id number]  Omar Person, NP ED   03/23/2018 1844 03/23/2018 2125 Full Code [id number]  Vonzella Nipple, NP ED   11/17/2014 1500 11/18/2014 1314 Full Code [id number]  Debbe Odea, MD ED   01/29/2012 1638 01/30/2012 1418 Full Code [id number]  Jule Economy, RN Inpatient       Prognosis:   < 2 weeks: acute on chronic respiratory failure with hypoxia requiring HFNC, pulmonary fibrosis, acute stroke, severe decline in functional/cognitive/nutritonal status.   Discharge Planning:  Hospice facility  Care plan was discussed with patient, RN  Thank you for allowing the Palliative Medicine Team to assist in the care of this patient.   Time In: 0930 Time Out: 0950 Total Time 20 Prolonged Time Billed no      Greater than 50%  of this time was spent counseling and coordinating care related to the above assessment and plan.  Ihor Dow, FNP-C Palliative Medicine Team  Phone: [phone] Fax: [phone]  Please contact Palliative Medicine Team phone at [phone] for questions and concerns.

## 2018-04-07 ENCOUNTER — Telehealth: Payer: Self-pay

## 2018-04-07 MED ORDER — MORPHINE SULFATE (CONCENTRATE) 10 MG/0.5ML PO SOLN
5.0000 mg | Freq: Four times a day (QID) | ORAL | Status: DC
  Administered 2018-04-07 (×2): 5 mg via SUBLINGUAL
  Filled 2018-04-07 (×2): qty 0.5

## 2018-04-07 MED ORDER — PREDNISONE 10 MG PO TABS: ORAL_TABLET | ORAL | Status: AC

## 2018-04-07 NOTE — Discharge Summary (Signed)
Physician Discharge Summary  Terry Peck PXT:[id number] DOB: Jan 15, 1941 DOA: 03/23/2018  PCP: Biagio Borg, MD  Admit date: 03/23/2018 Discharge date: 04/07/2018  Admitted From: Home Disposition: [place]: None Equipment/Devices: None  Discharge Condition: Hospice CODE STATUS: DNR Diet recommendation: Comfort   Brief/Interim Summary:  Admission HPI written by Greta Doom, MD   HPI:                                                                                                                                         Terry Peck is an 77 y.o. male HTN, DM, pulmonary fibrosis, HLD, CHF who presented to [facility] ED as a code stroke with c/o of right facial droop, right sided weakness and aphasia.   No prior stroke history. Not on anti-coagulation. Per EMS patient was completely normal and at [id number] wife heard a "thud" she went to check on him and he had fallen. Then she noticed the right side weakness and facial droop. EMS was called.   Hospital course:  Acute ischemic stroke Left MCA 2/2 left ICA/M2 occlusion. S/p tPA and angiogram with complete revascularization. Patient is comfort care.  Acute on chronic respiratory failure with hypoxia Pulmonary fibrosis Per report, patient is on 2L at home via nasal canula. Required intubation, now extubated. Worsened requirement up to 15 L via HFNC currently. Pulmonology on board now signed ff. Completed course of Levaquin and Solo-medrol but Solu-medrol restarted. Mild improvement in oxygen needs. Plan is for full comfort care with discharge to residential hospice. Pulmonology recommending taper of prednisone on discharge, 60 mg, down 10 mg weekly if able to take by mouth if able.  Somnolence Likely secondary to respiratory failure. Wax and waning.  Essential hypertension Coreg discontinued.  Diabetes mellitus, type 2 SSI discontinued.  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  Ejection fraction improved. Currently stable. [employer] called for deactivation. AICD deactivated 12/10  Leukocytosis In setting of steroids. No fevers. Obtained procalcitonin which was undetectable. Patient is comfort care currently  Discharge Diagnoses:  Principal Problem:   Stroke [facility]) Active Problems:   Dyspnea   Middle cerebral artery embolism, left   Acute embolic stroke (HCC)   Hypoxia   Pulmonary fibrosis ([place])   Dysphasia, post-stroke   Dysphagia, post-stroke   Diabetes mellitus type 2 in nonobese [facility])   Essential hypertension   Hypokalemia   Acute respiratory failure ([place])   Terminal care   Palliative care by specialist   Comfort measures only status    Discharge Instructions   Allergies as of 04/07/2018      Reactions   Penicillins Anaphylaxis, Other (See Comments)   "swelling of eyes; throat; could breath good" (01/29/2012) Has patient had a PCN reaction causing immediate rash, facial/tongue/throat swelling, SOB or lightheadedness with hypotension: Yes Has patient had a PCN reaction causing severe rash involving mucus membranes or  skin necrosis: Unknown Has patient had a PCN reaction that required hospitalization: Unknown Has patient had a PCN reaction occurring within the last 10 years: No If all of the above answers are "NO", then may proceed with Cephalospor   Iodine Other (See Comments)   "if I get iodine dye, I'll get a fever" (01/29/2012)   Fish Oil Itching   Pt said only when given through IV      Medication List    STOP taking these medications   aspirin EC 81 MG tablet   atorvastatin 20 MG tablet Commonly known as:  LIPITOR   B-12 PO   budesonide 180 MCG/ACT inhaler Commonly known as:  PULMICORT   Calcium-Magnesium-Zinc 1000-400-15 MG Tabs   carvedilol 25 MG tablet Commonly known as:  COREG   ibuprofen 200 MG tablet Commonly known as:  ADVIL,MOTRIN   Insulin Glargine 100 UNIT/ML Solostar Pen Commonly known as:  LANTUS SOLOSTAR     Insulin Pen Needle 32G X 4 MM Misc Commonly known as:  RELION PEN NEEDLES   Lutein 20 MG Caps   metFORMIN 500 MG 24 hr tablet Commonly known as:  GLUCOPHAGE-XR   methocarbamol 500 MG tablet Commonly known as:  ROBAXIN   multivitamin with minerals Tabs tablet   Omega 3 1000 MG Caps   omeprazole 20 MG capsule Commonly known as:  PRILOSEC   pioglitazone 30 MG tablet Commonly known as:  ACTOS   Vitamin D 1000 units capsule     TAKE these medications   predniSONE 10 MG tablet Commonly known as:  DELTASONE Take 6 tablets (60 mg total) by mouth daily for 7 days, THEN 5 tablets (50 mg total) daily for 7 days, THEN 4 tablets (40 mg total) daily for 7 days, THEN 3 tablets (30 mg total) daily for 7 days, THEN 2 tablets (20 mg total) daily for 7 days, THEN 1 tablet (10 mg total) daily for 7 days. Start taking on:  April 08, 2018       Allergies  Allergen Reactions  . Penicillins Anaphylaxis and Other (See Comments)    "swelling of eyes; throat; could breath good" (01/29/2012) Has patient had a PCN reaction causing immediate rash, facial/tongue/throat swelling, SOB or lightheadedness with hypotension: Yes Has patient had a PCN reaction causing severe rash involving mucus membranes or skin necrosis: Unknown Has patient had a PCN reaction that required hospitalization: Unknown Has patient had a PCN reaction occurring within the last 10 years: No If all of the above answers are "NO", then may proceed with Cephalospor  . Iodine Other (See Comments)    "if I get iodine dye, I'll get a fever" (01/29/2012)  . Fish Oil Itching    Pt said only when given through IV    Consultations:  Critical care  Neurology  Palliative care   Procedures/Studies: Ct Angio Head W Or Wo Contrast  Result Date: 03/23/2018 CLINICAL DATA:  Right-sided weakness, aphasia EXAM: CT ANGIOGRAPHY HEAD AND NECK TECHNIQUE: Multidetector CT imaging of the head and neck was performed using the standard  protocol during bolus administration of intravenous contrast. Multiplanar CT image reconstructions and MIPs were obtained to evaluate the vascular anatomy. Carotid stenosis measurements (when applicable) are obtained utilizing NASCET criteria, using the distal internal carotid diameter as the denominator. CONTRAST:  60mL ISOVUE-370 IOPAMIDOL (ISOVUE-370) INJECTION 76% COMPARISON:  CT head 03/23/2018 FINDINGS: CTA NECK FINDINGS Aortic arch: Atherosclerotic aortic arch without aneurysm. Mild atherosclerotic disease and tortuosity in the proximal great vessels without significant stenosis.  Right carotid system: Scattered atherosclerotic disease in the right common carotid artery. Atherosclerotic calcified plaque in the right carotid bifurcation. 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 Left carotid system: Scattered atherosclerotic disease left common carotid artery without significant stenosis. Atherosclerotic calcification left carotid bifurcation. There is decreased flow in the left common carotid artery due to acute occlusion of the proximal left internal carotid artery with thrombus present. There is no contrast distal to the proximal left internal carotid artery which is thrombosed. Left internal carotid artery is occluded to the supraclinoid segment. Vertebral arteries: Left vertebral artery dominant with moderate stenosis at the origin. Right vertebral non dominant without significant stenosis. Skeleton: Cervical spondylosis.  No acute skeletal abnormality. Other neck: Negative for mass or adenopathy in the neck. Upper chest: Transvenous pacemaker left. Interstitial fibrosis of the lungs bilaterally. No pleural effusion. Review of the MIP images confirms the above findings CTA HEAD FINDINGS Anterior circulation: Left internal carotid artery is occluded through the cavernous segment with reconstitution of the supraclinoid segment via flow from the anterior communicating artery. There is clot  throughout the left M1 segment with slow flow. There is a small amount of flow in left M2 branches with occlusion of the posterior division of left M2. Both anterior cerebral arteries are patent. Right middle cerebral artery patent without stenosis. Atherosclerotic plaque in the right cavernous carotid with mild stenosis. Posterior circulation: Both vertebral arteries patent to the basilar. Left vertebral dominant. Right PICA patent. Left PICA not visualized. AICA patent bilaterally. Basilar widely patent. Superior cerebellar artery patent. Right posterior cerebral artery patent. Probable fetal origin of the left posterior cerebral artery with supply from a hypoplastic left P1 segment. Venous sinuses: Patent Anatomic variants: None Delayed phase: Not performed Review of the MIP images confirms the above findings IMPRESSION: 1. 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 Reconstitution of the supraclinoid left internal carotid artery through the anterior communicating artery. There is clot in the left M1 and M2 segments with decreased blood flow. There is occlusion of the posterior division of the left middle cerebral artery due to clot. 2. 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 3. Moderate stenosis origin of left vertebral artery 4. Probable fetal origin left posterior cerebral artery which is supplied from a small hypoplastic left P1 segment. 5. These results were called by telephone at the time of interpretation on 03/23/2018 at 6:33 pm to Dr. Leonel Ramsay , who verbally acknowledged these results. Electronically Signed   By: Franchot Gallo M.D.   On: 03/23/2018 18:54   Ct Head Wo Contrast  Result Date: 03/24/2018 CLINICAL DATA:  77 y/o M; left ICA occlusion post intra-arterial intervention. EXAM: CT HEAD WITHOUT CONTRAST TECHNIQUE: Contiguous axial images were obtained from the base of the skull through the vertex without intravenous contrast. COMPARISON:  03/23/2018 CT  head, CTA head, and cerebral angiogram. FINDINGS: Brain: Cortical hypoattenuation with loss of gray-white differentiation and mild edema of the left lateral temporal lobe, temporal operculum, insula left parietal lobe, and the lateral aspect of the left occipital lobe compatible with late acute/early subacute infarction. No acute hemorrhage. No extra-axial collection, hydrocephalus, midline shift, or herniation. No additional area of acute stroke. Stable background of chronic microvascular ischemic changes and volume loss of the brain. 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 Vascular: Calcific atherosclerosis of carotid siphons and vertebral arteries. No hyperdense vessel identified. Skull: Normal. Negative for fracture or focal lesion. Sinuses/Orbits: No acute finding. Other:  None. IMPRESSION: 1. 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 No hemorrhage or significant mass effect. 2. Stable background of chronic microvascular ischemic changes and volume loss of the brain. 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 These results will be called to the ordering clinician or representative by the Radiologist Assistant, and communication documented in the PACS or zVision Dashboard. Electronically Signed   By: Kristine Garbe M.D.   On: 03/24/2018 20:38   Ct Angio Neck W Or Wo Contrast  Result Date: 03/23/2018 CLINICAL DATA:  Right-sided weakness, aphasia EXAM: CT ANGIOGRAPHY HEAD AND NECK TECHNIQUE: Multidetector CT imaging of the head and neck was performed using the standard protocol during bolus administration of intravenous contrast. Multiplanar CT image reconstructions and MIPs were obtained to evaluate the vascular anatomy. Carotid stenosis measurements (when applicable) are obtained utilizing NASCET criteria, using the distal internal carotid diameter as the denominator. CONTRAST:  28mL  ISOVUE-370 IOPAMIDOL (ISOVUE-370) INJECTION 76% COMPARISON:  CT head 03/23/2018 FINDINGS: CTA NECK FINDINGS Aortic arch: Atherosclerotic aortic arch without aneurysm. Mild atherosclerotic disease and tortuosity in the proximal great vessels without significant stenosis. Right carotid system: Scattered atherosclerotic disease in the right common carotid artery. Atherosclerotic calcified plaque in the right carotid bifurcation. 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 Left carotid system: Scattered atherosclerotic disease left common carotid artery without significant stenosis. Atherosclerotic calcification left carotid bifurcation. There is decreased flow in the left common carotid artery due to acute occlusion of the proximal left internal carotid artery with thrombus present. There is no contrast distal to the proximal left internal carotid artery which is thrombosed. Left internal carotid artery is occluded to the supraclinoid segment. Vertebral arteries: Left vertebral artery dominant with moderate stenosis at the origin. Right vertebral non dominant without significant stenosis. Skeleton: Cervical spondylosis.  No acute skeletal abnormality. Other neck: Negative for mass or adenopathy in the neck. Upper chest: Transvenous pacemaker left. Interstitial fibrosis of the lungs bilaterally. No pleural effusion. Review of the MIP images confirms the above findings CTA HEAD FINDINGS Anterior circulation: Left internal carotid artery is occluded through the cavernous segment with reconstitution of the supraclinoid segment via flow from the anterior communicating artery. There is clot throughout the left M1 segment with slow flow. There is a small amount of flow in left M2 branches with occlusion of the posterior division of left M2. Both anterior cerebral arteries are patent. Right middle cerebral artery patent without stenosis. Atherosclerotic plaque in the right cavernous carotid with mild  stenosis. Posterior circulation: Both vertebral arteries patent to the basilar. Left vertebral dominant. Right PICA patent. Left PICA not visualized. AICA patent bilaterally. Basilar widely patent. Superior cerebellar artery patent. Right posterior cerebral artery patent. Probable fetal origin of the left posterior cerebral artery with supply from a hypoplastic left P1 segment. Venous sinuses: Patent Anatomic variants: None Delayed phase: Not performed Review of the MIP images confirms the above findings IMPRESSION: 1. 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 Reconstitution of the supraclinoid left internal carotid artery through the anterior communicating artery. There is clot in the left M1 and M2 segments with decreased blood flow. There is occlusion of the posterior division of the left middle cerebral artery due to clot. 2. 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 3. Moderate stenosis origin of left vertebral artery 4. Probable fetal origin left posterior cerebral artery which is supplied from a small hypoplastic left P1 segment. 5. These results were called by telephone  at the time of interpretation on 03/23/2018 at 6:33 pm to Dr. Leonel Ramsay , who verbally acknowledged these results. Electronically Signed   By: Franchot Gallo M.D.   On: 03/23/2018 18:54   Ct Chest High Resolution  Result Date: 04/03/2018 CLINICAL DATA:  77 year old male with clinical suspicion of interstitial lung disease. Follow-up study. EXAM: CT CHEST WITHOUT CONTRAST TECHNIQUE: Multidetector CT imaging of the chest was performed following the standard protocol without intravenous contrast. High resolution imaging of the lungs, as well as inspiratory and expiratory imaging, was performed. COMPARISON:  Chest CT 12/01/2014. FINDINGS: Cardiovascular: Heart size is normal. There is no significant pericardial fluid, thickening or pericardial calcification. There is aortic atherosclerosis, as well as  atherosclerosis of the great vessels of the mediastinum and the coronary arteries, including calcified atherosclerotic plaque in the left main, left anterior descending, left circumflex and right coronary arteries. Calcifications of the aortic valve. Left-sided biventricular pacemaker/AICD with lead tips terminating in the right atrium, near the right ventricular apex and overlying the lateral wall the left ventricle via the coronary sinus and coronary veins. Mediastinum/Nodes: No pathologically enlarged mediastinal or hilar lymph nodes. Please note that accurate exclusion of hilar adenopathy is limited on noncontrast CT scans. Moderate to large hiatal hernia. No axillary lymphadenopathy. Lungs/Pleura: High-resolution images demonstrate widespread areas of ground-glass attenuation, septal thickening, subpleural reticulation and thickening of the peribronchovascular interstitium. A few scattered areas of mild traction bronchiectasis are noted, most evident in the right middle lobe and upper lobes of the lungs bilaterally. Mild honeycombing also noted in the dependent portions of the lower lobes of the lungs bilaterally. No acute consolidative airspace disease. No pleural effusions. 6 mm left lower lobe nodule (axial image 57 of series 6), stable dating back to 2016, considered definitively benign. No other definite larger more suspicious appearing pulmonary nodules or masses are noted. Upper Abdomen: Exophytic 5.7 cm low-attenuation lesion in the upper pole the left kidney, not characterized on today's noncontrast CT examination, but statistically likely a cyst. Small calcified gallstones lying dependently in the gallbladder. Aortic atherosclerosis. Musculoskeletal: There are no aggressive appearing lytic or blastic lesions noted in the visualized portions of the skeleton. IMPRESSION: 1. The appearance of the lungs is compatible with interstitial lung disease, with a spectrum of findings considered probable usual  interstitial pneumonia (UIP) per current ATS guidelines. Repeat high-resolution chest CT is suggested in 12 months to assess for temporal changes in the appearance of the lung parenchyma. 2. Aortic atherosclerosis, in addition to left main and 3 vessel coronary artery disease. Assessment for potential risk factor modification, dietary therapy or pharmacologic therapy may be warranted, if clinically indicated. 3. There are calcifications of the aortic valve. Echocardiographic correlation for evaluation of potential valvular dysfunction may be warranted if clinically indicated. 4. Moderate to large hiatal hernia. Aortic Atherosclerosis (ICD10-I70.0). Electronically Signed   By: Vinnie Langton M.D.   On: 04/03/2018 08:35   [place]  Result Date: 03/25/2018 INDICATION: Acute onset of aphasia, left gaze deviation and right-sided paresis. Occluded left middle cerebral artery, and left internal carotid artery on CT angiogram of the head and neck. EXAM: 1. EMERGENT LARGE VESSEL OCCLUSION THROMBOLYSIS (anterior CIRCULATION) COMPARISON:  CT angiogram of the head and neck of 03/23/2018. MEDICATIONS: Ancef 2 g IV antibiotic was administered within 1 hour of the procedure. ANESTHESIA/SEDATION: General anesthesia CONTRAST:  Isovue 300 approximately 80 mL. FLUOROSCOPY TIME:  Fluoroscopy Time: 33 minutes 48 seconds (2127 mGy). COMPLICATIONS: None immediate. TECHNIQUE: Following a full explanation  of the procedure along with the potential associated complications, an informed witnessed consent was obtained from the patient's spouse and daughter. The risks of intracranial hemorrhage of 10%, worsening neurological deficit, ventilator dependency, death and inability to revascularize were all reviewed in detail with the patient's spouse and daughter. The patient was then put under general anesthesia by the Department of Anesthesiology at [facility]. The right groin was prepped and draped in the usual sterile  fashion. Thereafter using modified Seldinger technique, transfemoral access into the right common femoral artery was obtained without difficulty. Over a 0.035 inch guidewire a 5 French Pinnacle sheath was inserted. Through this, and also over a 0.035 inch guidewire a 5 [place] JB 1 catheter was advanced to the aortic arch region and selectively positioned in the left common carotid artery. FINDINGS: The left common carotid arteriogram demonstrates mild stenosis at the origin of the left external carotid artery. More distally, the left external carotid artery branches are seen to opacify normally. The left internal carotid artery at the bulb and just distal to the bulb opacifies with stagnation of blood flow. There slow ascent of contrast into the proximal left internal carotid artery in the cervical region. More distally, there is no reconstitution of the left internal carotid artery. No reconstitution is noted from the left external carotid artery branches either. PROCEDURE: ENDOVASCULAR REVASCULARIZATION OF OCCLUDED LEFT INTERNAL CAROTID ARTERY EXTRA CRANIALLY AND INTRACRANIALLY WITH 1 PASS USING THE 5 MM X 33 MM EMBOTRAP RETRIEVAL DEVICE, AND ALSO OF THE LEFT MIDDLE CEREBRAL ARTERY SUPERIOR DIVISION WITH 1 PASS USING THE 5 MM X 33 MM EMBOTRAP DEVICE ACHIEVING A TICI 3 REVASCULARIZATION. The diagnostic JB 1 catheter in the left common carotid artery was exchanged over a 0.035 inch 300 cm Rosen exchange guidewire for an 8 French 55 cm Brite tip neurovascular sheath using biplane roadmap technique and constant fluoroscopic guidance. Good aspiration was obtained from the hub of the 8 French neurovascular sheath. This was then connected to continuous heparinized saline infusion. Over the [employer] guidewire, an 8 [place] 85 cm FlowGate balloon guide catheter which had been prepped with 50% contrast and 50% heparinized saline infusion was advanced and positioned just proximal to the left common carotid bifurcation.  The guidewire was removed. Good aspiration obtained from the hub of the [facility] guide catheter. A gentle contrast injection demonstrated no evidence of dissection or of intraluminal filling defects. Over a 0.014 inch Softip Synchro micro guidewire, a combination of a 115 cm 5 [place] Navien guide catheter inside of which was an 021 Trevo ProVue microcatheter was advanced to the distal end of the [facility] guide catheter. The combination was then advanced with the micro guidewire leading with a J-tip configuration without any difficulty. The combination was advanced to the supraclinoid left ICA. There, the micro guidewire was removed. Slow aspiration of blood was noted at the hub of the microcatheter. A gentle contrast injection through the microcatheter demonstrated faint opacification of the left MCA M1 region. The micro guidewire was then reinserted. With a torque device, the micro guidewire was advanced into the M1 M2 junction of the inferior division of the left middle cerebral artery followed by the microcatheter. The guidewire was removed. Good aspiration obtained from the hub of the microcatheter. A gentle contrast injection demonstrated safe position of tip of the microcatheter. This was then connected to continuous heparinized saline infusion. A gentle control arteriogram performed through the Navien guide catheter in the proximal cavernous segment demonstrated complete angiographic nonvisualization at  the level of the ophthalmic artery. A 5 mm x 33 mm Embotrap retrieval device was then advanced in a coaxial manner and with constant heparinized saline infusion to the distal end of the microcatheter. The proximal and the distal landing zones were then defined. The O ring on the delivery microcatheter was then loosened. With slight forward gentle traction with the right hand on the delivery micro guidewire, with the left hand the retrieval device was then retrieved. A gentle control arteriogram performed  through the Navien 5 [place] guide catheter in the left internal carotid artery proximal cavernous segment demonstrated continued nonvisualization of the distal left internal carotid artery in the left middle cerebral artery distribution. Proximal flow arrest was then initiated by advancing the [facility] guide catheter into the proximal left internal carotid artery and inflating the balloon. With constant aspiration being applied at the hub of the [facility] guide catheter with a 60 mL syringe, and the Navien guide catheter with a Penumbra vacuum suction device, the combination of the retrieval device, the microcatheter and the 5 [place] Navien guide catheter were retrieved and removed. Aspiration was continued as the proximal flow arrest was then reversed by deflating the balloon of the [facility] guide catheter. Free aspiration of blood was noted at the hub of the [facility] guide catheter. A gentle control arteriogram performed through the [facility] guide catheter in the proximal left internal carotid artery demonstrated complete revascularization of the occluded left internal carotid artery proximally and distally. A wide left posterior communicating artery was seen opacifying the left posterior cerebral artery distribution. The left anterior and the left middle cerebral arteries were now widely patent. There continued to be a filling defect noted in the superior division of the left middle cerebral artery. The combination of the 5 [place] Navien guide catheter inside of which was an 021 Trevo ProVue microcatheter was again advanced using biplane roadmap technique and constant fluoroscopic guidance over a 0.014 inch Softip Synchro micro guidewire to the supraclinoid left ICA. The micro guidewire was then gently manipulated with the torque device and advanced into the occluded superior division with a filling defect in the M2 M3 region followed by the microcatheter. The guidewire was removed. There was slow aspiration of  blood at the hub of the microcatheter. A gentle control arteriogram through the microcatheter demonstrated safe position of tip of the microcatheter. The 5 mm x 33 mm Embotrap retrieval device was again advanced as described above to the distal end of the microcatheter. The O ring on the delivery microcatheter was then loosened. With slight forward gentle traction with the right hand the delivery micro guidewire with the left hand the delivery microcatheter was retrieved deploying the retrieval device. A control arteriogram performed through the Navien guide catheter in the distal cavernous left ICA demonstrated a TICI 3 revascularization. With proximal flow arrest again initiating the left internal carotid artery by inflating the FlowGate balloon guide catheter, the combination of the retrieval device, and the microcatheter and the 5 [place] Navien guide catheter were then retrieved and removed as constant aspiration was applied with a 60 mL syringe at the hub of the [facility] guide catheter, and a Penumbra suction vacuum device at the hub of the Navien guide catheter. Proximal flow arrest was continued as the balloon was deflated in the left internal carotid artery. Clot was noted in the aspirate, and also in the retrieval device. Earlier the aspirate had revealed copious amounts of dark clot at the time of the first pass. A control  arteriogram performed through the 8 [place] FlowGate guide catheter in the left internal carotid artery demonstrated a complete revascularization of the previously noted filling defect within the superior division M2 segment of the left middle cerebral artery. Overall a TICI 3 revascularization had been established. Moderate vasospasm at the superior division of the left middle cerebral artery proximally responded to 2 aliquots of 25 mcg nitroglycerin intra-arterially in the left internal carotid artery. Throughout the procedure, the patient's blood pressure and neurological status  remained stable. No evidence of bradycardia or mass-effect or extravasation of contrast was noted. The FlowGate balloon guide catheter, and the 8 French Brite tip sheath were then removed over a 0.035 inch guidewire and replaced with an 8 [place] Pinnacle sheath. This was left in situ and connected to continuous heparinized saline infusion. The right groin appeared soft without evidence of hematoma. Distal pulses remained palpable in the dorsalis pedis, and posterior tibial regions on the right side, and Dopplerable DP and PT on the left side unchanged. Patient's CT angiogram revealed no evidence of a gross hemorrhage or mass effect or midline shift. The patient was left intubated on account of his pulmonary fibrosis. He was then transferred to the neuro ICU to continue with further post revascularization of large vessel ischemic stroke. IMPRESSION: Status post endovascular complete revascularization of occluded left internal carotid artery extra cranially and intracranially with 1 pass with the 5 mm x 33 mm Embotrap retrieval device. Status post endovascular complete revascularization of occluded superior division of the left middle cerebral artery with 1 pass with a 5 mm x 33 mm Embotrap retrieval device achieving a TICI 3 revascularization. PLAN: Follow-up in clinic 1 month post discharge. Electronically Signed   By: Luanne Bras M.D.   On: 03/24/2018 11:13   Dg Chest Port 1 View  Result Date: 04/01/2018 CLINICAL DATA:  77 year old male status post emergent large vessel occlusion (left ICA, left MCA) and endovascular treatment. respiratory failure. Pulmonary fibrosis, oxygen-dependent. EXAM: PORTABLE CHEST 1 VIEW COMPARISON:  03/27/2018 and earlier. FINDINGS: Portable AP semi upright view at 0653 hours. Extubated and enteric tube removed. Stable left chest AICD. Lung volumes remain lower than baseline with chronic reticular opacity in both lungs. No pneumothorax. Mild lung base atelectasis suspected.  Stable cardiac size and mediastinal contours. There is oral contrast in the transverse colon. IMPRESSION: 1. Extubated and enteric tube removed. 2. Lower lung volumes with atelectasis superimposed on chronic lung disease. 3. Oral contrast in the transverse colon. Electronically Signed   By: Genevie Ann M.D.   On: 04/01/2018 09:11   Dg Chest Port 1 View  Result Date: 03/27/2018 CLINICAL DATA:  Congestive heart failure, endotracheal tube EXAM: PORTABLE CHEST 1 VIEW COMPARISON:  Portable chest 03/25/2017 FINDINGS: The tip of the endotracheal tube is approximately 4.1 cm above the carina. NG tube extends into the stomach. Permanent pacemaker is present and cardiomegaly is stable. There is mild pulmonary vascular congestion present. IMPRESSION: 1. Cardiomegaly and probable mild pulmonary vascular congestion superimposed on chronic change. 2. Tip of endotracheal tube 4.1 cm above the carina. Electronically Signed   By: Ivar Drape M.D.   On: 03/27/2018 13:58   Dg Chest Port 1 View  Result Date: 03/25/2018 CLINICAL DATA:  Initial evaluation for endotracheal tube placement EXAM: PORTABLE CHEST 1 VIEW COMPARISON:  Prior radiograph from earlier the same day. FINDINGS: Tip of endotracheal tube positioned approximately 3.5 cm above the carina. Enteric tube courses into the abdomen. Left-sided pacemaker/AICD noted. Cardiac and mediastinal silhouettes are  stable, and remain within normal limits. Lungs remain hypoinflated. Chronic interstitial changes with superimposed mild bibasilar atelectasis and/or infiltrates again noted,, perhaps mildly improved. No large pleural effusion. No pneumothorax. No acute osseous abnormality. IMPRESSION: 1. Tip of the endotracheal tube 3.5 cm above the carina. 2. Shallow lung inflation with chronic interstitial changes. Superimposed bibasilar opacities could reflect atelectasis and/or infiltrates, slightly improved from prior radiograph earlier today. Electronically Signed   By: Jeannine Boga M.D.   On: 03/25/2018 19:03   Portable Chest X-ray  Result Date: 03/25/2018 CLINICAL DATA:  Intubation. EXAM: PORTABLE CHEST 1 VIEW COMPARISON:  Twelve 05/2017.  02/18/2018. FINDINGS: Endotracheal tube tip 3 cm above the carina. NG tube tip in the stomach. Heart size normal. Diffuse bilateral pulmonary interstitial prominence noted. Interstitial changes are stable and most likely represent chronic interstitial lung disease. Mild bibasilar atelectasis/infiltrates. No pleural effusion or pneumothorax. IMPRESSION: 1. Endotracheal tube tip noted 3 cm above the carina. NG tube tip noted in stomach. 3.  AICD in stable position.  Heart size stable. 4. Stable chronic interstitial changes. Mild bibasilar atelectasis/infiltrates again noted without interim change. Electronically Signed   By: Marcello Moores  Register   On: 03/25/2018 10:13   Dg Chest Port 1 View  Result Date: 03/24/2018 CLINICAL DATA:  Hypoxia EXAM: PORTABLE CHEST 1 VIEW COMPARISON:  Chest radiograph 03/23/2018 FINDINGS: Multi lead AICD device overlies the left hemithorax. Leads are stable in position. Interval extubation and removal of enteric tube. Stable cardiomegaly. Interval increase in patchy consolidation within the left mid lower lung. Scattered coarse interstitial opacities are similar when compared to prior. IMPRESSION: Increased patchy consolidation left lower lung may represent pneumonia in the appropriate clinical setting. Followup PA and lateral chest X-ray is recommended in 3-4 weeks following trial of antibiotic therapy to ensure resolution and exclude underlying malignancy. Coarse interstitial opacities bilaterally may represent edema superimposed upon chronic interstitial process. Electronically Signed   By: Lovey Newcomer M.D.   On: 03/24/2018 21:43   Dg Chest Port 1 View  Result Date: 03/23/2018 CLINICAL DATA:  Post intubation and orogastric tube placement. EXAM: PORTABLE CHEST 1 VIEW COMPARISON:  02/18/2018 FINDINGS:  Endotracheal tube has tip 2.9 cm above the carina. Placement of enteric tube with tip over the stomach in the left upper quadrant. Left-sided pacemaker unchanged. Lungs are hypoinflated demonstrate stable chronic bilateral interstitial disease. Mild prominence of the perihilar markings suggesting mild degree of vascular congestion cardiomediastinal silhouette and remainder of the exam is unchanged. IMPRESSION: Suggestion mild vascular congestion.  Chronic interstitial disease. Tubes and lines as described. Electronically Signed   By: Marin Olp M.D.   On: 03/23/2018 21:56   Dg Swallowing Func-speech Pathology  Result Date: 03/30/2018 Objective Swallowing Evaluation: Type of Study: MBS-Modified Barium Swallow Study  Patient Details Name: Terry Peck MRN: [id number] Date of Birth: 1940-09-13 Today's Date: 03/30/2018 Time: SLP Start Time (ACUTE ONLY): 2229 -SLP Stop Time (ACUTE ONLY): 1410 SLP Time Calculation (min) (ACUTE ONLY): 25 min Past Medical History: Past Medical History: Diagnosis Date . Abnormal ANCA test 07/03/2011 . Allergy  . Arthritis   "mild; right shoulder" (01/29/2012) . B12 deficiency anemia  . Barrett's esophagus 11/23/2010 . Bundle branch block, left  . Cataract   L eye surgery . CHF (congestive heart failure) ([place])   pacemaker . Chronic systolic dysfunction of left ventricle   EF 30% . Colon polyps 11/23/2010 . GERD (gastroesophageal reflux disease)  . Hyperlipidemia 10/14/2012 . Hypertension  . ICD (implantable cardiac defibrillator) in place  .  Iron deficiency anemia 05/30/2011 . Liver abscess 1980's . Nonischemic cardiomyopathy ([place])   s/p St. Jude BiV ICD 01/29/12 . Pulmonary fibrosis ([place])  . Pulmonary nodules  . Shortness of breath   "related to heart being out of rhythm" (01/29/2012) . Type II diabetes mellitus ([place])  Past Surgical History: Past Surgical History: Procedure Laterality Date . abscess drained  ?1980's  liver . BI-VENTRICULAR IMPLANTABLE CARDIOVERTER DEFIBRILLATOR N/A 01/29/2012   Procedure: BI-VENTRICULAR IMPLANTABLE CARDIOVERTER DEFIBRILLATOR  (CRT-D);  Surgeon: Thompson Grayer, MD;  Location: [facility] CATH LAB;  Service: Cardiovascular;  Laterality: N/A; . CARDIAC DEFIBRILLATOR PLACEMENT  01/29/2012  SJM Quadra Assura BiV ICD implanted by Dr Rayann Heman . CATARACT EXTRACTION Left  . COLONOSCOPY   . IR CT HEAD LTD  03/23/2018 . IR PERCUTANEOUS ART THROMBECTOMY/INFUSION INTRACRANIAL INC DIAG ANGIO  03/23/2018 . RADIOLOGY WITH ANESTHESIA N/A 03/23/2018  Procedure: RADIOLOGY WITH ANESTHESIA;  Surgeon: Luanne Bras, MD;  Location: [place];  Service: Radiology;  Laterality: N/A; . TONSILLECTOMY  1952 . UPPER GI ENDOSCOPY   HPI: Terry Peck is an 77 y.o. male HTN, DM, pulmonary fibrosis, HLD, CHF who presented to [facility] ED as a code stroke with c/o of right facial droop, right sided weakness and aphasia. Patient was intubated for airway protection on 12/1. Extubated 12/2. Reintubated 12/3 due to respiratory decompensation. Self extubated 12/3 and emergently reintubated. Extubated 12/6 to Bipap, family changed status to DNI.  Subjective: alert, cooperative, aphasic Assessment / Plan / Recommendation CHL IP CLINICAL IMPRESSIONS 03/30/2018 Clinical Impression No cough response, cued cough, but ineffective to clear. With nectar thick liquids similarly the swallow was initiated at the pyriform sinuses, penetration and aspiration (although a slightly smaller volume) during the swallow. No cough response by patient, verbally cued by SLP,  but ineffective to clear. REduced laryngeal elevation and incomplete closure of larynx. However with a verbal and tactile cue patient implemented a chin tuck resulting in no penetration/aspiratin with nectar thickened liquids. Honey thickened liquids, purees and solids, the swallow was triggered at the valleculae with no penetration/aspriation noted. Despite command to swallow pill whole, pt chewed pill in applesauce and it passed easily through the pharynx and upper esophagus.  Recommend Dys 2 diet with honey thickened liquids. Medications given whole in puree. Dysphagia therapy to include training to use chin tuck consistently, continued evaluation for diet upgrade (repeat MBS due to silent aspiration) and education with patient and family on aspiration precautions.  SLP Visit Diagnosis Dysphagia, oropharyngeal phase (R13.12) Attention and concentration deficit following -- Frontal lobe and executive function deficit following -- Impact on safety and function Moderate aspiration risk   CHL IP TREATMENT RECOMMENDATION 03/29/2018 Treatment Recommendations F/U MBS in --- days (Comment);Defer treatment plan to f/u with SLP   Prognosis 03/30/2018 Prognosis for Safe Diet Advancement Fair Barriers to Reach Goals Cognitive deficits;Severity of deficits Barriers/Prognosis Comment -- CHL IP DIET RECOMMENDATION 03/30/2018 SLP Diet Recommendations Dysphagia 2 (Fine chop) solids;Honey thick liquids Liquid Administration via Cup Medication Administration Whole meds with puree Compensations Minimize environmental distractions;Slow rate;Small sips/bites Postural Changes Remain semi-upright after after feeds/meals (Comment);Seated upright at 90 degrees   CHL IP OTHER RECOMMENDATIONS 03/30/2018 Recommended Consults -- Oral Care Recommendations Oral care QID Other Recommendations Order thickener from pharmacy   CHL IP FOLLOW UP RECOMMENDATIONS 03/30/2018 Follow up Recommendations Inpatient Rehab   CHL IP FREQUENCY AND DURATION 03/30/2018 Speech Therapy Frequency (ACUTE ONLY) min 2x/week Treatment Duration 2 weeks      CHL IP ORAL PHASE 03/30/2018 Oral Phase Impaired Oral -  Pudding Teaspoon Incomplete tongue to palate contact;Reduced posterior propulsion;Piecemeal swallowing Oral - Pudding Cup -- Oral - Honey Teaspoon -- Oral - Honey Cup Incomplete tongue to palate contact;Lingual/palatal residue;Piecemeal swallowing;Reduced posterior propulsion Oral - Nectar Teaspoon -- Oral - Nectar Cup Weak lingual  manipulation;Incomplete tongue to palate contact;Lingual/palatal residue;Piecemeal swallowing Oral - Nectar Straw -- Oral - Thin Teaspoon -- Oral - Thin Cup Weak lingual manipulation;Incomplete tongue to palate contact;Lingual/palatal residue;Piecemeal swallowing Oral - Thin Straw -- Oral - Puree -- Oral - Mech Soft -- Oral - Regular -- Oral - Multi-Consistency -- Oral - Pill -- Oral Phase - Comment --  CHL IP PHARYNGEAL PHASE 03/30/2018 Pharyngeal Phase Impaired Pharyngeal- Pudding Teaspoon Delayed swallow initiation-vallecula;Reduced laryngeal elevation;Reduced airway/laryngeal closure;Reduced tongue base retraction Pharyngeal -- Pharyngeal- Pudding Cup -- Pharyngeal -- Pharyngeal- Honey Teaspoon -- Pharyngeal -- Pharyngeal- Honey Cup Delayed swallow initiation-vallecula;Reduced laryngeal elevation;Reduced airway/laryngeal closure;Reduced tongue base retraction Pharyngeal -- Pharyngeal- Nectar Teaspoon -- Pharyngeal -- Pharyngeal- Nectar Cup Delayed swallow initiation-pyriform sinuses;Reduced laryngeal elevation;Reduced airway/laryngeal closure;Reduced tongue base retraction;Penetration/Aspiration during swallow;Moderate aspiration Pharyngeal Material enters airway, passes BELOW cords without attempt by patient to eject out (silent aspiration) Pharyngeal- Nectar Straw -- Pharyngeal -- Pharyngeal- Thin Teaspoon -- Pharyngeal -- Pharyngeal- Thin Cup Delayed swallow initiation-pyriform sinuses;Reduced laryngeal elevation;Reduced airway/laryngeal closure;Reduced tongue base retraction;Penetration/Aspiration during swallow;Moderate aspiration Pharyngeal Material enters airway, passes BELOW cords without attempt by patient to eject out (silent aspiration) Pharyngeal- Thin Straw -- Pharyngeal -- Pharyngeal- Puree -- Pharyngeal -- Pharyngeal- Mechanical Soft -- Pharyngeal -- Pharyngeal- Regular -- Pharyngeal -- Pharyngeal- Multi-consistency -- Pharyngeal -- Pharyngeal- Pill -- Pharyngeal -- Pharyngeal Comment --  CHL IP  CERVICAL ESOPHAGEAL PHASE 03/30/2018 Cervical Esophageal Phase WFL Pudding Teaspoon -- Pudding Cup -- Honey Teaspoon -- Honey Cup -- Nectar Teaspoon -- Nectar Cup -- Nectar Straw -- Thin Teaspoon -- Thin Cup -- Thin Straw -- Puree -- Mechanical Soft -- Regular -- Multi-consistency -- Pill -- Cervical Esophageal Comment -- Charlynne Cousins Ward, MA, CCC-SLP 03/30/2018 2:27 PM              Ir Percutaneous Art Thrombectomy/infusion Intracranial Inc Diag Angio  Result Date: 03/25/2018 INDICATION: Acute onset of aphasia, left gaze deviation and right-sided paresis. Occluded left middle cerebral artery, and left internal carotid artery on CT angiogram of the head and neck. EXAM: 1. EMERGENT LARGE VESSEL OCCLUSION THROMBOLYSIS (anterior CIRCULATION) COMPARISON:  CT angiogram of the head and neck of 03/23/2018. MEDICATIONS: Ancef 2 g IV antibiotic was administered within 1 hour of the procedure. ANESTHESIA/SEDATION: General anesthesia CONTRAST:  Isovue 300 approximately 80 mL. FLUOROSCOPY TIME:  Fluoroscopy Time: 33 minutes 48 seconds (2127 mGy). COMPLICATIONS: None immediate. TECHNIQUE: Following a full explanation of the procedure along with the potential associated complications, an informed witnessed consent was obtained from the patient's spouse and daughter. The risks of intracranial hemorrhage of 10%, worsening neurological deficit, ventilator dependency, death and inability to revascularize were all reviewed in detail with the patient's spouse and daughter. The patient was then put under general anesthesia by the Department of Anesthesiology at [facility]. The right groin was prepped and draped in the usual sterile fashion. Thereafter using modified Seldinger technique, transfemoral access into the right common femoral artery was obtained without difficulty. Over a 0.035 inch guidewire a 5 French Pinnacle sheath was inserted. Through this, and also over a 0.035 inch guidewire a 5 [place] JB 1 catheter was advanced  to the aortic arch region and selectively positioned in the left common carotid artery. FINDINGS: The left  common carotid arteriogram demonstrates mild stenosis at the origin of the left external carotid artery. More distally, the left external carotid artery branches are seen to opacify normally. The left internal carotid artery at the bulb and just distal to the bulb opacifies with stagnation of blood flow. There slow ascent of contrast into the proximal left internal carotid artery in the cervical region. More distally, there is no reconstitution of the left internal carotid artery. No reconstitution is noted from the left external carotid artery branches either. PROCEDURE: ENDOVASCULAR REVASCULARIZATION OF OCCLUDED LEFT INTERNAL CAROTID ARTERY EXTRA CRANIALLY AND INTRACRANIALLY WITH 1 PASS USING THE 5 MM X 33 MM EMBOTRAP RETRIEVAL DEVICE, AND ALSO OF THE LEFT MIDDLE CEREBRAL ARTERY SUPERIOR DIVISION WITH 1 PASS USING THE 5 MM X 33 MM EMBOTRAP DEVICE ACHIEVING A TICI 3 REVASCULARIZATION. The diagnostic JB 1 catheter in the left common carotid artery was exchanged over a 0.035 inch 300 cm Rosen exchange guidewire for an 8 French 55 cm Brite tip neurovascular sheath using biplane roadmap technique and constant fluoroscopic guidance. Good aspiration was obtained from the hub of the 8 French neurovascular sheath. This was then connected to continuous heparinized saline infusion. Over the [employer] guidewire, an 8 [place] 85 cm FlowGate balloon guide catheter which had been prepped with 50% contrast and 50% heparinized saline infusion was advanced and positioned just proximal to the left common carotid bifurcation. The guidewire was removed. Good aspiration obtained from the hub of the [facility] guide catheter. A gentle contrast injection demonstrated no evidence of dissection or of intraluminal filling defects. Over a 0.014 inch Softip Synchro micro guidewire, a combination of a 115 cm 5 [place] Navien guide  catheter inside of which was an 021 Trevo ProVue microcatheter was advanced to the distal end of the [facility] guide catheter. The combination was then advanced with the micro guidewire leading with a J-tip configuration without any difficulty. The combination was advanced to the supraclinoid left ICA. There, the micro guidewire was removed. Slow aspiration of blood was noted at the hub of the microcatheter. A gentle contrast injection through the microcatheter demonstrated faint opacification of the left MCA M1 region. The micro guidewire was then reinserted. With a torque device, the micro guidewire was advanced into the M1 M2 junction of the inferior division of the left middle cerebral artery followed by the microcatheter. The guidewire was removed. Good aspiration obtained from the hub of the microcatheter. A gentle contrast injection demonstrated safe position of tip of the microcatheter. This was then connected to continuous heparinized saline infusion. A gentle control arteriogram performed through the Navien guide catheter in the proximal cavernous segment demonstrated complete angiographic nonvisualization at the level of the ophthalmic artery. A 5 mm x 33 mm Embotrap retrieval device was then advanced in a coaxial manner and with constant heparinized saline infusion to the distal end of the microcatheter. The proximal and the distal landing zones were then defined. The O ring on the delivery microcatheter was then loosened. With slight forward gentle traction with the right hand on the delivery micro guidewire, with the left hand the retrieval device was then retrieved. A gentle control arteriogram performed through the Navien 5 [place] guide catheter in the left internal carotid artery proximal cavernous segment demonstrated continued nonvisualization of the distal left internal carotid artery in the left middle cerebral artery distribution. Proximal flow arrest was then initiated by advancing the [facility]  guide catheter into the proximal left internal carotid artery and inflating the balloon. With  constant aspiration being applied at the hub of the [facility] guide catheter with a 60 mL syringe, and the Navien guide catheter with a Penumbra vacuum suction device, the combination of the retrieval device, the microcatheter and the 5 [place] Navien guide catheter were retrieved and removed. Aspiration was continued as the proximal flow arrest was then reversed by deflating the balloon of the [facility] guide catheter. Free aspiration of blood was noted at the hub of the [employer] guide catheter. A gentle control arteriogram performed through the [facility] guide catheter in the proximal left internal carotid artery demonstrated complete revascularization of the occluded left internal carotid artery proximally and distally. A wide left posterior communicating artery was seen opacifying the left posterior cerebral artery distribution. The left anterior and the left middle cerebral arteries were now widely patent. There continued to be a filling defect noted in the superior division of the left middle cerebral artery. The combination of the 5 [place] Navien guide catheter inside of which was an 021 Trevo ProVue microcatheter was again advanced using biplane roadmap technique and constant fluoroscopic guidance over a 0.014 inch Softip Synchro micro guidewire to the supraclinoid left ICA. The micro guidewire was then gently manipulated with the torque device and advanced into the occluded superior division with a filling defect in the M2 M3 region followed by the microcatheter. The guidewire was removed. There was slow aspiration of blood at the hub of the microcatheter. A gentle control arteriogram through the microcatheter demonstrated safe position of tip of the microcatheter. The 5 mm x 33 mm Embotrap retrieval device was again advanced as described above to the distal end of the microcatheter. The O ring on the delivery  microcatheter was then loosened. With slight forward gentle traction with the right hand the delivery micro guidewire with the left hand the delivery microcatheter was retrieved deploying the retrieval device. A control arteriogram performed through the Navien guide catheter in the distal cavernous left ICA demonstrated a TICI 3 revascularization. With proximal flow arrest again initiating the left internal carotid artery by inflating the FlowGate balloon guide catheter, the combination of the retrieval device, and the microcatheter and the 5 [place] Navien guide catheter were then retrieved and removed as constant aspiration was applied with a 60 mL syringe at the hub of the [facility] guide catheter, and a Penumbra suction vacuum device at the hub of the Navien guide catheter. Proximal flow arrest was continued as the balloon was deflated in the left internal carotid artery. Clot was noted in the aspirate, and also in the retrieval device. Earlier the aspirate had revealed copious amounts of dark clot at the time of the first pass. A control arteriogram performed through the 8 [place] FlowGate guide catheter in the left internal carotid artery demonstrated a complete revascularization of the previously noted filling defect within the superior division M2 segment of the left middle cerebral artery. Overall a TICI 3 revascularization had been established. Moderate vasospasm at the superior division of the left middle cerebral artery proximally responded to 2 aliquots of 25 mcg nitroglycerin intra-arterially in the left internal carotid artery. Throughout the procedure, the patient's blood pressure and neurological status remained stable. No evidence of bradycardia or mass-effect or extravasation of contrast was noted. The FlowGate balloon guide catheter, and the 8 French Brite tip sheath were then removed over a 0.035 inch guidewire and replaced with an 8 [place] Pinnacle sheath. This was left in situ and connected to  continuous heparinized saline infusion. The right groin appeared  soft without evidence of hematoma. Distal pulses remained palpable in the dorsalis pedis, and posterior tibial regions on the right side, and Dopplerable DP and PT on the left side unchanged. Patient's CT angiogram revealed no evidence of a gross hemorrhage or mass effect or midline shift. The patient was left intubated on account of his pulmonary fibrosis. He was then transferred to the neuro ICU to continue with further post revascularization of large vessel ischemic stroke. IMPRESSION: Status post endovascular complete revascularization of occluded left internal carotid artery extra cranially and intracranially with 1 pass with the 5 mm x 33 mm Embotrap retrieval device. Status post endovascular complete revascularization of occluded superior division of the left middle cerebral artery with 1 pass with a 5 mm x 33 mm Embotrap retrieval device achieving a TICI 3 revascularization. PLAN: Follow-up in clinic 1 month post discharge. Electronically Signed   By: Luanne Bras M.D.   On: 03/24/2018 11:13   Ct Head Code Stroke Wo Contrast  Result Date: 03/23/2018 CLINICAL DATA:  Code stroke.  Right-sided weakness, aphasia EXAM: CT HEAD WITHOUT CONTRAST TECHNIQUE: Contiguous axial images were obtained from the base of the skull through the vertex without intravenous contrast. COMPARISON:  None. FINDINGS: Brain: Moderate atrophy. Patchy hypodensity in the cerebral white matter bilaterally compatible with mild chronic microvascular ischemia. 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 Negative for acute hemorrhage.  No acute infarct or mass. Vascular: Hyperdense left MCA. Hyperdense terminal left internal carotid artery. Atherosclerotic calcification cavernous carotid bilaterally. Skull: Negative Sinuses/Orbits: Paranasal sinuses clear. Bilateral cataract surgery. Other: None ASPECTS ([place] Stroke Program Early CT Score)  - Ganglionic level infarction (caudate, lentiform nuclei, internal capsule, insula, M1-M3 cortex): 7 - Supraganglionic infarction (M4-M6 cortex): 3 Total score (0-10 with 10 being normal): 10 IMPRESSION: 1. Hyperdense terminal left internal carotid artery and left MCA compatible with thrombosis. No evidence of acute infarct or hemorrhage 2. Atrophy and chronic microvascular ischemic changes in the white matter. 3. ASPECTS is 10 4. These results were called by telephone at the time of interpretation on 03/23/2018 at 6:33 pm to Dr. Leonel Ramsay, who verbally acknowledged these results. Electronically Signed   By: Franchot Gallo M.D.   On: 03/23/2018 18:35      Subjective: No issues overnight.  Discharge Exam: Vitals:   04/06/18 2043 04/07/18 0850  BP: 118/77 118/78  Pulse: 83 79  Resp: 20 20  Temp: 97.8 F (36.6 C) 98 F (36.7 C)  SpO2: 92% 94%   Vitals:   04/05/18 2018 04/06/18 0845 04/06/18 2043 04/07/18 0850  BP: 132/76 133/70 118/77 118/78  Pulse: 82 86 83 79  Resp: 20 20 20 20   Temp: (!) 97.4 F (36.3 C) 98 F (36.7 C) 97.8 F (36.6 C) 98 F (36.7 C)  TempSrc: Oral Axillary Oral Oral  SpO2: 94% 92% 92% 94%  Weight:      Height:        General: Pt is alert, awake, not in acute distress Respiratory: RR of 24, mild accessory muscle usage    The results of significant diagnostics from this hospitalization (including imaging, microbiology, ancillary and laboratory) are listed below for reference.     Labs: Basic Metabolic Panel: Recent Labs  Lab 04/01/18 0605 04/02/18 0533  NA 143 140  K 3.2* 4.1  CL 103 101  CO2 29 27  GLUCOSE 188* 331*  BUN 31* 29*  CREATININE 0.79 0.94  CALCIUM 8.5* 8.7*  MG 2.0  --  PHOS 3.2  --    CBC: Recent Labs  Lab 04/01/18 0605 04/02/18 0533 04/03/18 0519 04/04/18 0456  WBC 16.3* 23.0* 25.6* 25.8*  NEUTROABS  --  19.1*  --   --   HGB 11.0* 12.4* 12.6* 11.9*  HCT 35.0* 38.4* 40.3 38.0*  MCV 92.8 91.6 92.2 92.5  PLT 245 288  283 245   CBG: Recent Labs  Lab 04/03/18 1111 04/03/18 1643 04/03/18 2109 04/04/18 0744 04/04/18 1242  GLUCAP 328* 281* 322* 317* 280*   Urinalysis    Component Value Date/Time   COLORURINE YELLOW 03/23/2018 2129   APPEARANCEUR CLEAR 03/23/2018 2129   LABSPEC >1.046 (H) 03/23/2018 2129   PHURINE 5.0 03/23/2018 2129   GLUCOSEU NEGATIVE 03/23/2018 2129   GLUCOSEU NEGATIVE 05/28/2017 0951   HGBUR NEGATIVE 03/23/2018 2129   BILIRUBINUR NEGATIVE 03/23/2018 2129   KETONESUR NEGATIVE 03/23/2018 2129   PROTEINUR NEGATIVE 03/23/2018 2129   UROBILINOGEN 0.2 05/28/2017 0951   NITRITE NEGATIVE 03/23/2018 2129   LEUKOCYTESUR NEGATIVE 03/23/2018 2129    SIGNED:   [name], MD [facility] 04/07/2018, 1:44 PM

## 2018-04-07 NOTE — Progress Notes (Signed)
15 Days Post-Op   Subjective/Chief Complaint:  1 - Gross Hematuria - new gross hemturia after I/O cath after recetn tPA for CVA.  Hgb 11.7, Cr 0.9. Blood mostly at meatus / peri-catheter. Managed with large bore indwelling catheter and resolved.   Now managing bladder with condom cath.    Today "Kvion" is being transferred to inpatient hospice in [facility].    Objective: Vital signs in last 24 hours: Temp:  [97.8 F (36.6 C)-98 F (36.7 C)] 98 F (36.7 C) (12/16 0850) Pulse Rate:  [79-83] 79 (12/16 0850) Resp:  [20] 20 (12/16 0850) BP: (118)/(77-78) 118/78 (12/16 0850) SpO2:  [92 %-94 %] 94 % (12/16 0850) Last BM Date: (PTA)  Intake/Output from previous day: 12/15 0701 - 12/16 0700 In: 240 [P.O.:240] Out: 900 [Urine:900] Intake/Output this shift: No intake/output data recorded.  General appearance: alert and pleasant but ill appearing, family at bedside Eyes: negative Nose: Nares normal. Septum midline. Mucosa normal. No drainage or sinus tenderness. Throat: lips, mucosa, and tongue normal; teeth and gums normal Neck: supple, symmetrical, trachea midline Back: symmetric, no curvature. ROM normal. No CVA tenderness. Resp: non-labored on room air.  Cardio: Nl rate GI: soft, non-tender; bowel sounds normal; no masses,  no organomegaly Male genitalia: normal, condom cath in place with clear yellow urine htat is non-foul.  Extremities: UE bruising w/o hematomas.  Skin: Skin color, texture, turgor normal. No rashes or lesions  Lab Results:  No results for input(s): WBC, HGB, HCT, PLT in the last 72 hours. BMET No results for input(s): NA, K, CL, CO2, GLUCOSE, BUN, CREATININE, CALCIUM in the last 72 hours. PT/INR No results for input(s): LABPROT, INR in the last 72 hours. ABG No results for input(s): PHART, HCO3 in the last 72 hours.  Invalid input(s): PCO2, PO2  Studies/Results: No results found.  Anti-infectives: Anti-infectives (From admission, onward)   Start     Dose/Rate Route Frequency Ordered Stop   03/25/18 1700  levofloxacin (LEVAQUIN) IVPB 750 mg     750 mg 100 mL/hr over 90 Minutes Intravenous Every 24 hours 03/25/18 1545 03/29/18 1921   03/23/18 1914  ceFAZolin (ANCEF) 2-4 GM/100ML-% IVPB    Note to Pharmacy:  Novella Rob   : cabinet override      03/23/18 1914 03/24/18 0729      Assessment/Plan:   1 - Gross Hematuria - resolved. No further evaluation warranted as pt now going to hospice. UOP remains good.   [facility], Laddie Math 04/07/2018

## 2018-04-07 NOTE — Care Management Important Message (Signed)
Important Message  Patient Details  Name: SHAIL URBAS MRN: [id number] Date of Birth: 03/18/1941   Medicare Important Message Given:  Yes    Pollie Friar, RN 04/07/2018, 1:09 PM

## 2018-04-07 NOTE — Clinical Social Work Note (Signed)
Clinical Social Work Assessment  Patient Details  Name: Terry Peck MRN: [id number] Date of Birth: 30-May-1940  Date of referral:  04/07/18               Reason for consult:  End of Life/Hospice                Permission sought to share information with:  Facility Sport and exercise psychologist, Family Supports Permission granted to share information::  Yes, Verbal Permission Granted  Name::     Garnette Gunner  Agency::  [facility]  Relationship::  Children  Contact Information:     Housing/Transportation Living arrangements for the past 2 months:  [place] of Information:  Medical Team, Adult Children Patient Interpreter Needed:  None Criminal Activity/Legal Involvement Pertinent to Current Situation/Hospitalization:  No - Comment as needed Significant Relationships:  Adult Children, Spouse Lives with:  Self, Spouse Do you feel safe going back to the place where you live?  Yes Need for family participation in patient care:  Yes (Comment)  Care giving concerns:  Patient is at end of life and family has decided to pursue residential hospice placement.   Social Worker assessment / plan:  CSW met with patient's family at bedside to discuss residential hospice placement. Patient's family lives in [place] and [place], so they would prefer a hospice facility that would be closer for them in that geographical area. CSW sent referral to [facility] and will follow.  Employment status:  Retired Nurse, adult PT Recommendations:  [place] / Referral to community resources:  Other (Comment Required)  Patient/Family's Response to care:  Patient's family agreeable to residential hospice placement.  Patient/Family's Understanding of and Emotional Response to Diagnosis, Current Treatment, and Prognosis:  Patient's family are at peace with the decision that the patient is at end of life and would  not want him to suffer, they want him to be comfortable and cared for. Patient's family is hopeful that he can be somewhere closer to where they live so it's less travel for them, so that they can be with him more often.   Emotional Assessment Appearance:  Appears stated age Attitude/Demeanor/Rapport:    Affect (typically observed):  Unable to Assess Orientation:  Oriented to Self Alcohol / Substance use:  Not Applicable Psych involvement (Current and /or in the community):  No (Comment)  Discharge Needs  Concerns to be addressed:  Care Coordination Readmission within the last 30 days:  No Current discharge risk:  Dependent with Mobility, Terminally ill, Cognitively Impaired, Physical Impairment Barriers to Discharge:  Continued Medical Work up, [place], [place] 04/07/2018, 11:58 AM

## 2018-04-07 NOTE — Progress Notes (Signed)
Daily Progress Note   Patient Name: Terry Peck       Date: 04/07/2018 DOB: May 17, 1940  Age: 77 y.o. MRN#: [id number] Attending Physician: Mariel Aloe, MD Primary Care Physician: Biagio Borg, MD Admit Date: 03/23/2018  Reason for Consultation/Follow-up: Establishing goals of care and Terminal Care  Subjective:  Patient wakes to voice. He is hard of hearing. He appears mildly short of breath and tells me he has some difficulty breathing. Declines food or drink.  Family at bedside. Discussed scheduling low-dose SL morphine to maintain comfort from dyspnea. Family agreeable. They are meeting with hospice liaison this morning.   Length of Stay: 15  Current Medications: Scheduled Meds:  .  stroke: mapping our early stages of recovery book   Does not apply Once  . chlorhexidine  15 mL Mouth Rinse BID  . mouth rinse  15 mL Mouth Rinse q12n4p  . methylPREDNISolone (SOLU-MEDROL) injection  40 mg Intravenous Daily  . morphine CONCENTRATE  5 mg Sublingual Q6H    Continuous Infusions:   PRN Meds: acetaminophen **OR** acetaminophen (TYLENOL) oral liquid 160 mg/5 mL **OR** acetaminophen, glycopyrrolate **OR** glycopyrrolate **OR** glycopyrrolate, haloperidol **OR** haloperidol **OR** haloperidol lactate, LORazepam **OR** [DISCONTINUED] LORazepam **OR** LORazepam, morphine injection, morphine CONCENTRATE **OR** morphine CONCENTRATE, RESOURCE THICKENUP CLEAR, senna-docusate  Physical Exam Vitals signs and nursing note reviewed.  Constitutional:      Appearance: He is cachectic. He is ill-appearing.  HENT:     Head: Normocephalic and atraumatic.  Cardiovascular:     Rate and Rhythm: Normal rate.     Heart sounds: Murmur present.  Pulmonary:     Effort: No tachypnea, accessory  muscle usage or respiratory distress.     Breath sounds: Decreased breath sounds present.     Comments: Mild dyspnea. Skin:    General: Skin is warm and dry.     Coloration: Skin is mottled.     Findings: Ecchymosis present.  Neurological:     Mental Status: He is easily aroused.     Comments: Drowsy, wakes to voice. Pleasantly confused  Psychiatric:        Attention and Perception: He is inattentive.        Speech: Speech is delayed.            Vital Signs: BP 118/78 (BP Location: Left Arm)  Pulse 79   Temp 98 F (36.7 C) (Oral)   Resp 20   Ht 5\' 7"  (1.702 m)   Wt 77.3 kg   SpO2 94%   BMI 26.69 kg/m  SpO2: SpO2: 94 % O2 Device: O2 Device: Nasal Cannula O2 Flow Rate: O2 Flow Rate (L/min): 10 L/min  Intake/output summary:   Intake/Output Summary (Last 24 hours) at 04/07/2018 1127 Last data filed at 04/07/2018 0513 Gross per 24 hour  Intake 120 ml  Output 900 ml  Net -780 ml   LBM: Last BM Date: (PTA) Baseline Weight: Weight: 75 kg Most recent weight: Weight: 77.3 kg       Palliative Assessment/Data: PPS 20%   Flowsheet Rows     Most Recent Value  Intake Tab  Referral Department  Critical care  Unit at Time of Referral  Intermediate Care Unit  Palliative Care Primary Diagnosis  Pulmonary  Date Notified  04/03/18  Palliative Care Type  New Palliative care  Reason for referral  Clarify Goals of Care, End of Life Care Assistance  Date of Admission  03/23/18  Date first seen by Palliative Care  04/04/18  # of days Palliative referral response time  1 Day(s)  # of days IP prior to Palliative referral  11  Clinical Assessment  Palliative Performance Scale Score  20%  Psychosocial & Spiritual Assessment  Palliative Care Outcomes  Patient/Family meeting held?  Yes  Who was at the meeting?  son and daughter  Palliative Care Outcomes  Improved pain interventions, Improved non-pain symptom therapy, Clarified goals of care, Counseled regarding hospice, Provided  end of life care assistance, Provided psychosocial or spiritual support, Changed to focus on comfort, Changed CPR status, Transitioned to hospice      Patient Active Problem List   Diagnosis Date Noted  . Terminal care   . Palliative care by specialist   . Comfort measures only status   . Acute embolic stroke ([place])   . Hypoxia   . Pulmonary fibrosis ([place])   . Dysphasia, post-stroke   . Dysphagia, post-stroke   . Diabetes mellitus type 2 in nonobese (HCC)   . Essential hypertension   . Hypokalemia   . Acute respiratory failure ([place])   . Stroke ([place]) 03/23/2018  . 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 Type 2 diabetes mellitus with complication, with long-term current use of insulin ([place]) 02/18/2017  . Restrictive lung disease 08/16/2016  . Nocturnal hypoxemia 08/16/2016  . Exercise hypoxemia 08/16/2016  . History of iron deficiency anemia 08/16/2016  . Symptomatic anemia 11/17/2014  . Solitary pulmonary nodule 03/12/2014  . Hyperlipidemia 10/14/2012  . Automatic implantable cardioverter-defibrillator in situ 01/31/2012  . Overweight (BMI 25.0-29.9) 09/10/2011  . Chronic systolic heart failure ([place]) 06/28/2011  . Left bundle branch block 06/28/2011  . IPF (idiopathic pulmonary fibrosis) ([place]) 06/25/2011  . Abnormal echocardiogram 06/20/2011  . Iron deficiency anemia 05/30/2011  . B12 deficiency 05/30/2011  . Dyspnea 05/29/2011  . Shoulder pain, right 05/29/2011  . Abnormal ECG 05/29/2011  . Preventative health care 11/23/2010  . Colon polyps 11/23/2010  . Barrett's esophagus 11/23/2010  . Allergy   . Arthritis   . Diabetes ([place])   . GERD (gastroesophageal reflux disease)   . Hypertensive cardiovascular disease     Palliative Care Assessment & Plan   Patient Profile: 78 y.o. male  with past medical history ofchronic systolic heart failure s/p AICD, smoking, GERD, HTN, HLD, pulmonary fibrosis on 2L oxygen at home, and T2DM admitted  on 03/23/2018 with R  sided weakness and aphasia. He was found to have left MCA infarct.  Patient underwent tPA and angiogram with complete revascularization. He required intubation 12/1, was extubated 12/2, reintubated then self-extubated, emergently reintubated and finally extubated and made DNI on 12/6. The plan was for patient to go to CIR; however, patient continues to require HFNC. His high-res CT revealed progression of fibrosis with ground glass opacities. Per pulmonology, overall prognosis is poor. PMT consulted by pulmonology d/t poor prognosis to discuss end of life care.    Assessment: Acute on chronic respiratory failure with hypoxia Pulmonary fibrosis Acute ischemic stroke Chronic combined CHF Leukocytosis Somnolence Dyspnea  Recommendations/Plan:  Comfort measures only. Pending inpatient hospice bed.   Continue current regimen for symptom management. Roxanol 5mg  SL q6h scheduled to maintain comfort from dyspnea.  Comfort feeds per patient/family request.  Goals of Care and Additional Recommendations:  Limitations on Scope of Treatment: Full Comfort Care  Code Status: DNR/DNI   Code Status Orders  (From admission, onward)         Start     Ordered   04/04/18 1357  Do not attempt resuscitation (DNR)  Continuous    Question Answer Comment  In the event of cardiac or respiratory ARREST Do not call a "code blue"   In the event of cardiac or respiratory ARREST Do not perform Intubation, CPR, defibrillation or ACLS   In the event of cardiac or respiratory ARREST Use medication by any route, position, wound care, and other measures to relive pain and suffering. May use oxygen, suction and manual treatment of airway obstruction as needed for comfort.      04/04/18 1358        Code Status History    Date Active Date Inactive Code Status Order ID Comments User Context   04/04/2018 1254 04/04/2018 1358 DNR [id number]  Philis Pique, NP Inpatient   04/04/2018 1238 04/04/2018 1254 DNR  [id number]  Philis Pique, NP Inpatient   03/28/2018 1201 04/04/2018 1238 Partial Code [id number]  Corinda Gubler, RN Inpatient   03/23/2018 2136 03/28/2018 1201 Full Code [id number]  Luanne Bras, MD Inpatient   03/23/2018 2126 03/23/2018 2136 Full Code [id number]  Omar Person, NP ED   03/23/2018 1844 03/23/2018 2125 Full Code [id number]  Vonzella Nipple, NP ED   11/17/2014 1500 11/18/2014 1314 Full Code [id number]  Debbe Odea, MD ED   01/29/2012 1638 01/30/2012 1418 Full Code [id number]  Jule Economy, RN Inpatient       Prognosis:   < 2 weeks: acute on chronic respiratory failure with hypoxia requiring HFNC, pulmonary fibrosis, acute stroke, severe decline in functional/cognitive/nutritonal status.   Discharge Planning:  Hospice facility  Care plan was discussed with patient, family at bedside.  Thank you for allowing the Palliative Medicine Team to assist in the care of this patient.   Time In: 1110 Time Out: 1125 Total Time 15 Prolonged Time Billed no      Greater than 50%  of this time was spent counseling and coordinating care related to the above assessment and plan.  Ihor Dow, FNP-C Palliative Medicine Team  Phone: [phone] Fax: [phone]  Please contact Palliative Medicine Team phone at [phone] for questions and concerns.

## 2018-04-07 NOTE — Progress Notes (Signed)
Discharge to: [facility]osp[facility] at [facility] Anticipated discharge date: 04/07/18 Family notified: Aundra Millet, by phone Transportation by: Corey Harold  CSW signing off.  Laveda Abbe LCSW [phone]

## 2018-04-07 NOTE — Progress Notes (Signed)
Nutrition Brief Note  Chart reviewed. Pt with comfort measures only with comfort feeds per patient/family request.  No further nutrition interventions warranted at this time.  Please re-consult as needed.   Corrin Parker, MS, RD, LDN Pager # [phone] After hours/ weekend pager # [phone]

## 2018-04-07 NOTE — Consult Note (Signed)
[facility] [place]:  Received call today for new referral for pt and wanting to go to the [facility].   Met with family at bedside wife, and two children. Discussed hospice philosophy and goals of care. discussed the care with Dr. Ander Purpura and the pt was approved for hospice care at [facility].  Bed offered and accepted by family.   Paper work completed. Kathlee Nations MSW will arrange transport and discharge process.  Webb Silversmith RN [phone]

## 2018-04-07 NOTE — Telephone Encounter (Signed)
LMOVM reminding pt to send remote transmission.   

## 2018-04-08 ENCOUNTER — Telehealth: Payer: Self-pay | Admitting: *Deleted

## 2018-04-08 DIAGNOSIS — J84112 Idiopathic pulmonary fibrosis: Secondary | ICD-10-CM | POA: Diagnosis not present

## 2018-04-08 DIAGNOSIS — J8410 Pulmonary fibrosis, unspecified: Secondary | ICD-10-CM | POA: Diagnosis not present

## 2018-04-08 DIAGNOSIS — I110 Hypertensive heart disease with heart failure: Secondary | ICD-10-CM | POA: Diagnosis not present

## 2018-04-08 DIAGNOSIS — I5022 Chronic systolic (congestive) heart failure: Secondary | ICD-10-CM | POA: Diagnosis not present

## 2018-04-08 NOTE — Telephone Encounter (Signed)
Pt was on TCM report admitted 03/23/18 for stroke with c/o of right facial droop, right sided weakness and aphasia. Pt had left MCA 2/2 left ICA/M2 occlusion. S/p tPA and angiogram with complete revascularization. Plan is for full comfort care with discharge to residential hospice on 04/07/18.Marland KitchenJohny Chess

## 2018-04-11 LAB — CUP PACEART REMOTE DEVICE CHECK
Battery Remaining Longevity: 28 mo
Battery Remaining Percentage: 30 %
Battery Voltage: 2.87 V
Brady Statistic AP VP Percent: 24 %
Brady Statistic AP VS Percent: 1 %
Brady Statistic AS VP Percent: 75 %
Brady Statistic AS VS Percent: 1 %
Date Time Interrogation Session: 20191008080016
HIGH POWER IMPEDANCE MEASURED VALUE: 74 Ohm
HighPow Impedance: 74 Ohm
Implantable Lead Implant Date: 20131008
Implantable Lead Implant Date: 20131008
Implantable Lead Implant Date: 20131008
Implantable Lead Location: 753858
Implantable Lead Location: 753860
Implantable Pulse Generator Implant Date: 20131008
Lead Channel Impedance Value: 1050 Ohm
Lead Channel Impedance Value: 390 Ohm
Lead Channel Impedance Value: 410 Ohm
Lead Channel Pacing Threshold Amplitude: 0.75 V
Lead Channel Pacing Threshold Amplitude: 0.75 V
Lead Channel Pacing Threshold Pulse Width: 0.5 ms
Lead Channel Pacing Threshold Pulse Width: 0.5 ms
Lead Channel Pacing Threshold Pulse Width: 0.5 ms
Lead Channel Sensing Intrinsic Amplitude: 11.4 mV
Lead Channel Sensing Intrinsic Amplitude: 4.5 mV
Lead Channel Setting Pacing Amplitude: 2 V
Lead Channel Setting Pacing Pulse Width: 0.5 ms
Lead Channel Setting Pacing Pulse Width: 0.5 ms
Lead Channel Setting Sensing Sensitivity: 0.5 mV
MDC IDC LEAD LOCATION: 753859
MDC IDC MSMT LEADCHNL RV PACING THRESHOLD AMPLITUDE: 1 V
MDC IDC SET LEADCHNL RA PACING AMPLITUDE: 1.75 V
MDC IDC SET LEADCHNL RV PACING AMPLITUDE: 2 V
MDC IDC STAT BRADY RA PERCENT PACED: 24 %
Pulse Gen Serial Number: 7054987

## 2018-04-14 NOTE — Progress Notes (Addendum)
No ICM remote transmission received for 04/07/2018 and next ICM transmission scheduled for 04/29/2018

## 2018-04-23 DEATH — deceased

## 2018-04-28 NOTE — Telephone Encounter (Signed)
Patient has passed and daughter would like a call back to confirm whether or not the oxygen concentrator he had was being rented from your office?

## 2018-04-29 NOTE — Telephone Encounter (Signed)
Spoke to pt's daughter and gave her my condolences and also informed her to check with his pulmonologist. She expressed understanding.

## 2018-04-29 NOTE — Telephone Encounter (Signed)
Sent Obit to HIM at [employer] [facility]

## 2018-11-28 ENCOUNTER — Ambulatory Visit: Payer: Medicare Other | Admitting: Internal Medicine

## 2020-04-02 IMAGING — CT CT ANGIO NECK
3 of 7 series · 10 of 36 positions shown · IV contrast (APPLIED)
Comparison: CT head 03/23/2018

CLINICAL DATA: Right-sided weakness, aphasia

EXAM:
CT ANGIOGRAPHY HEAD AND NECK
TECHNIQUE: Multidetector CT imaging of the head and neck was performed using
the standard protocol during bolus administration of intravenous
contrast. Multiplanar CT image reconstructions and MIPs were
obtained to evaluate the vascular anatomy. Carotid stenosis
measurements (when applicable) are obtained utilizing NASCET
criteria, using the distal internal carotid diameter as the
denominator.
CONTRAST:  50mL [id number] IOPAMIDOL ([id number]) INJECTION 76%

[Series 5: cta neck/head · axial · 0.50mm/px · z∈[+1234,+1358]mm · 2 of 187 slices shown]
[im 63/187  soft-tissue]
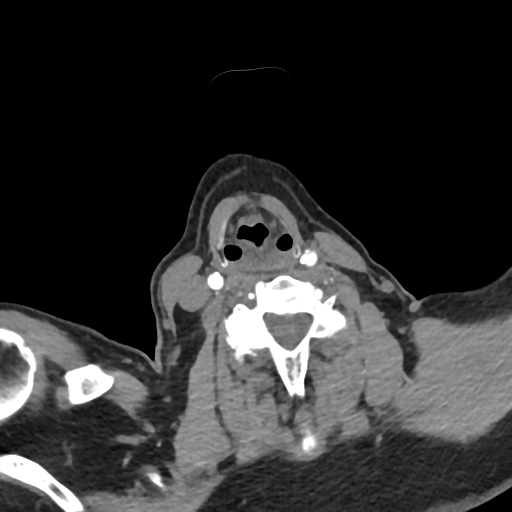
[im 125/187  soft-tissue]
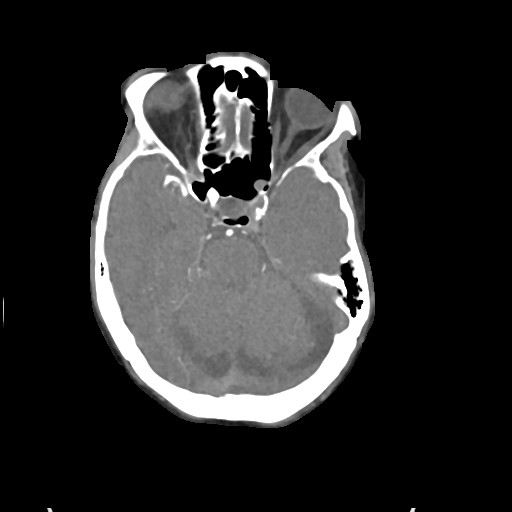

[Series 7: ax thins · axial · 0.47mm/px · z∈[+1142,+1401]mm · 6 of 366 slices shown]
[im 53/366  soft-tissue]
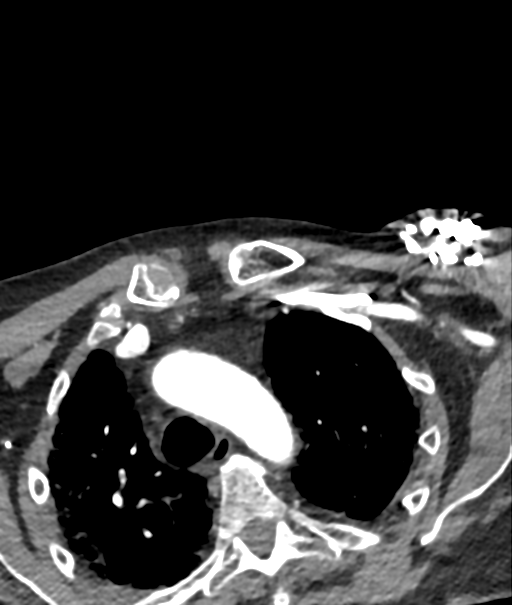
[im 105/366  bone]
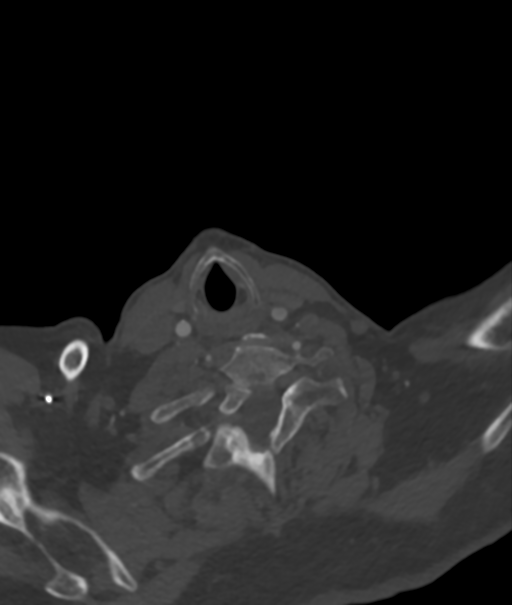
[im 157/366  soft-tissue]
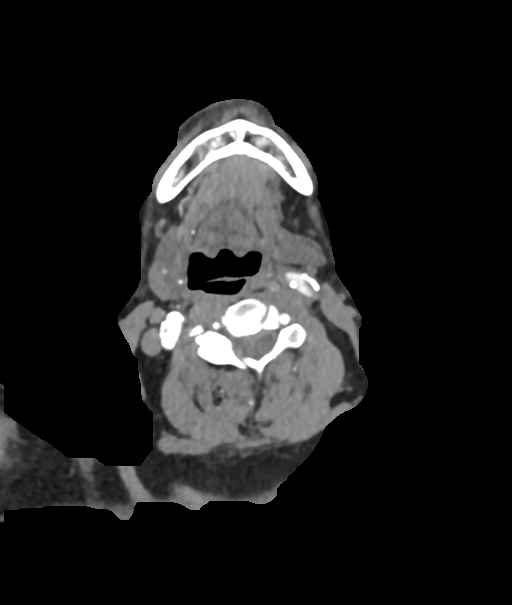
[im 209/366  bone]
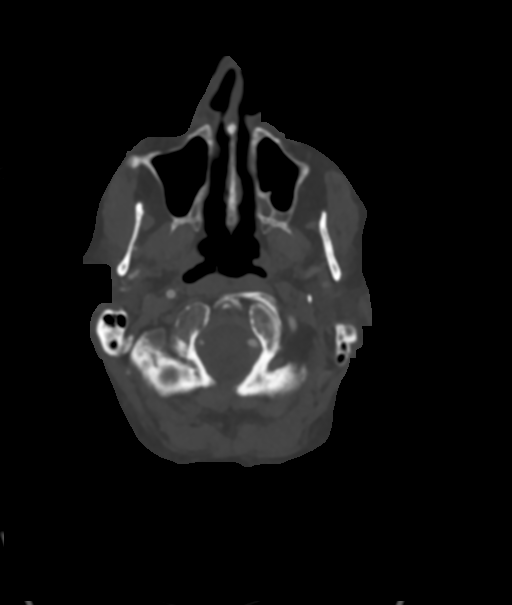
[im 261/366  soft-tissue]
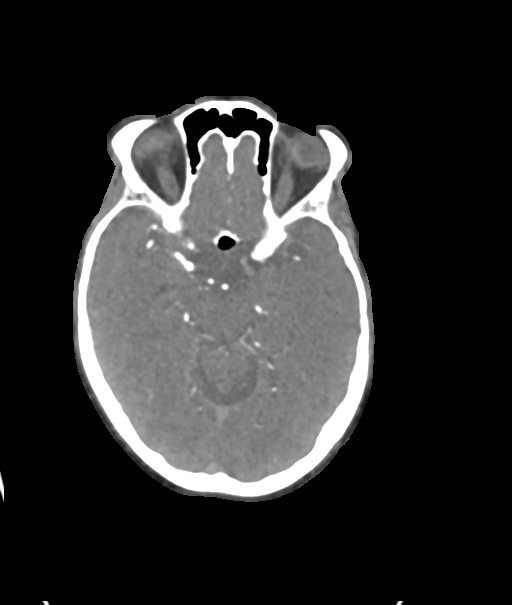
[im 313/366  bone]
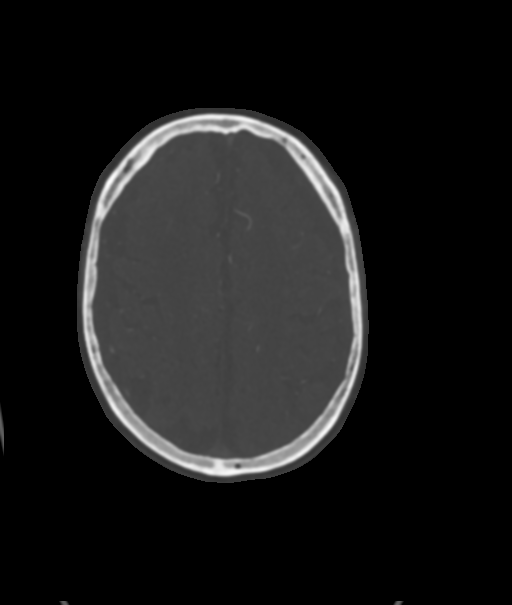

[Series 9: sag thins · sagittal · 0.61mm/px · 2 of 155 slices shown]
[im 13/155  soft-tissue]
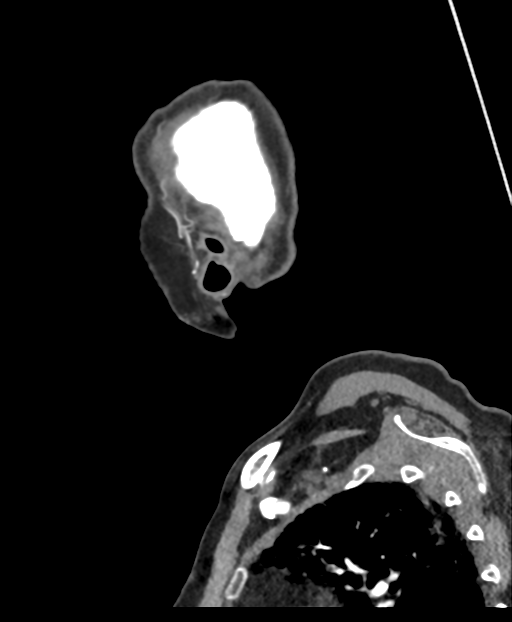
[im 143/155  soft-tissue]
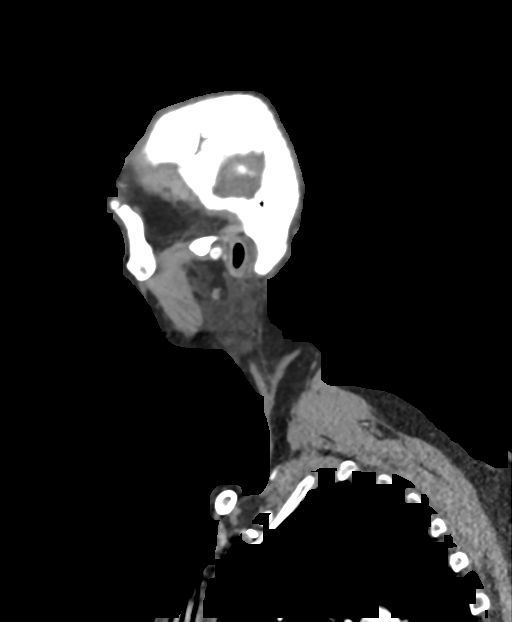

[10 of 36 positions shown; findings below may reference images not displayed]

FINDINGS: CTA NECK FINDINGS

Aortic arch: Atherosclerotic aortic arch without aneurysm. Mild
atherosclerotic disease and tortuosity in the proximal great vessels
without significant stenosis.

Right carotid system: Scattered atherosclerotic disease in the right
common carotid artery. Atherosclerotic calcified plaque in the right
carotid bifurcation. 50% diameter stenosis proximal right internal
carotid artery.

Left carotid system: Scattered atherosclerotic disease left common
carotid artery without significant stenosis. Atherosclerotic
calcification left carotid bifurcation. There is decreased flow in
the left common carotid artery due to acute occlusion of the
proximal left internal carotid artery with thrombus present. There
is no contrast distal to the proximal left internal carotid artery
which is thrombosed. Left internal carotid artery is occluded to the
supraclinoid segment.

Vertebral arteries: Left vertebral artery dominant with moderate
stenosis at the origin. Right vertebral non dominant without
significant stenosis.

Skeleton: Cervical spondylosis.  No acute skeletal abnormality.

Other neck: Negative for mass or adenopathy in the neck.

Upper chest: Transvenous pacemaker left. Interstitial fibrosis of
the lungs bilaterally. No pleural effusion.

Review of the MIP images confirms the above findings

CTA HEAD FINDINGS

Anterior circulation: Left internal carotid artery is occluded
through the cavernous segment with reconstitution of the
supraclinoid segment via flow from the anterior communicating
artery. There is clot throughout the left M1 segment with slow flow.
There is a small amount of flow in left M2 branches with occlusion
of the posterior division of left M2.

Both anterior cerebral arteries are patent. Right middle cerebral
artery patent without stenosis. Atherosclerotic plaque in the right
cavernous carotid with mild stenosis.

Posterior circulation: Both vertebral arteries patent to the
basilar. Left vertebral dominant. Right PICA patent. Left PICA not
visualized. AICA patent bilaterally. Basilar widely patent. Superior
cerebellar artery patent. Right posterior cerebral artery patent.

Probable fetal origin of the left posterior cerebral artery with
supply from a hypoplastic left P1 segment.

Venous sinuses: Patent

Anatomic variants: None

Delayed phase: Not performed

Review of the MIP images confirms the above findings
IMPRESSION: 1. Acute occlusion left internal carotid artery at the origin.
Reconstitution of the supraclinoid left internal carotid artery
through the anterior communicating artery. There is clot in the left
M1 and M2 segments with decreased blood flow. There is occlusion of
the posterior division of the left middle cerebral artery due to
clot.
2. 50% diameter stenosis proximal right internal carotid artery.
3. Moderate stenosis origin of left vertebral artery
4. Probable fetal origin left posterior cerebral artery which is
supplied from a small hypoplastic left P1 segment.
5. These results were called by telephone at the time of
interpretation on 03/23/2018 at [DATE] to Dr. Ivan , who
verbally acknowledged these results.
